# Patient Record
Sex: Female | Born: 1940 | Race: White | Hispanic: No | Smoking: Never smoker
Health system: Southern US, Community
[De-identification: ages and names within clinical notes are randomized; demographics above are authoritative.]

## PROBLEM LIST (undated history)

## (undated) DIAGNOSIS — Z1379 Encounter for other screening for genetic and chromosomal anomalies: Secondary | ICD-10-CM

## (undated) DIAGNOSIS — T8859XA Other complications of anesthesia, initial encounter: Secondary | ICD-10-CM

## (undated) DIAGNOSIS — K589 Irritable bowel syndrome without diarrhea: Secondary | ICD-10-CM

## (undated) DIAGNOSIS — H3530 Unspecified macular degeneration: Secondary | ICD-10-CM

## (undated) DIAGNOSIS — K5909 Other constipation: Secondary | ICD-10-CM

## (undated) DIAGNOSIS — Z780 Asymptomatic menopausal state: Secondary | ICD-10-CM

## (undated) DIAGNOSIS — R112 Nausea with vomiting, unspecified: Secondary | ICD-10-CM

## (undated) DIAGNOSIS — Z809 Family history of malignant neoplasm, unspecified: Secondary | ICD-10-CM

## (undated) DIAGNOSIS — K219 Gastro-esophageal reflux disease without esophagitis: Secondary | ICD-10-CM

## (undated) DIAGNOSIS — Z9889 Other specified postprocedural states: Secondary | ICD-10-CM

## (undated) DIAGNOSIS — M1990 Unspecified osteoarthritis, unspecified site: Secondary | ICD-10-CM

## (undated) DIAGNOSIS — C189 Malignant neoplasm of colon, unspecified: Secondary | ICD-10-CM

## (undated) DIAGNOSIS — C541 Malignant neoplasm of endometrium: Secondary | ICD-10-CM

## (undated) DIAGNOSIS — I10 Essential (primary) hypertension: Secondary | ICD-10-CM

## (undated) DIAGNOSIS — T4145XA Adverse effect of unspecified anesthetic, initial encounter: Secondary | ICD-10-CM

## (undated) HISTORY — PX: OTHER SURGICAL HISTORY: SHX169

## (undated) HISTORY — DX: Other constipation: K59.09

## (undated) HISTORY — DX: Asymptomatic menopausal state: Z78.0

## (undated) HISTORY — DX: Unspecified osteoarthritis, unspecified site: M19.90

## (undated) HISTORY — DX: Encounter for other screening for genetic and chromosomal anomalies: Z13.79

## (undated) HISTORY — DX: Family history of malignant neoplasm, unspecified: Z80.9

## (undated) HISTORY — PX: NASAL SINUS SURGERY: SHX719

## (undated) HISTORY — PX: VAGINAL HYSTERECTOMY: SUR661

## (undated) HISTORY — PX: FOOT SURGERY: SHX648

## (undated) HISTORY — DX: Essential (primary) hypertension: I10

## (undated) HISTORY — PX: BUNIONECTOMY: SHX129

## (undated) HISTORY — DX: Malignant neoplasm of endometrium: C54.1

## (undated) HISTORY — DX: Irritable bowel syndrome, unspecified: K58.9

## (undated) SURGERY — INSERTION, TUNNELED CENTRAL VENOUS DEVICE, WITH PORT
Anesthesia: IV Sedation (MBSC Only)

---

## 1969-11-14 HISTORY — PX: DILATION AND CURETTAGE OF UTERUS: SHX78

## 2005-03-24 ENCOUNTER — Ambulatory Visit: Payer: Self-pay | Admitting: Family Medicine

## 2006-09-18 ENCOUNTER — Ambulatory Visit: Payer: Self-pay | Admitting: Gastroenterology

## 2007-05-16 ENCOUNTER — Ambulatory Visit: Payer: Self-pay | Admitting: Family Medicine

## 2008-05-05 LAB — HM COLONOSCOPY: HM Colonoscopy: NORMAL

## 2008-07-09 ENCOUNTER — Ambulatory Visit: Payer: Self-pay | Admitting: Family Medicine

## 2009-04-22 ENCOUNTER — Ambulatory Visit: Payer: Self-pay | Admitting: Family Medicine

## 2009-07-13 ENCOUNTER — Ambulatory Visit: Payer: Self-pay | Admitting: Family Medicine

## 2010-04-19 LAB — HEMOGLOBIN A1C: Hgb A1c MFr Bld: 5.8 % (ref 4.0–6.0)

## 2010-04-19 LAB — BASIC METABOLIC PANEL WITH GFR
BUN: 22 mg/dL — AB (ref 4–21)
Creatinine: 0.9 mg/dL (ref 0.5–1.1)
Glucose: 97 mg/dL
Potassium: 4.9 mmol/L (ref 3.4–5.3)
Sodium: 138 mmol/L (ref 137–147)

## 2010-04-19 LAB — CBC AND DIFFERENTIAL: Neutrophils Absolute: 55 /uL

## 2010-04-19 LAB — LIPID PANEL: HDL: 65 mg/dL (ref 35–70)

## 2010-04-19 LAB — HEPATIC FUNCTION PANEL
ALT: 11 U/L (ref 7–35)
AST: 18 U/L (ref 13–35)

## 2010-07-14 ENCOUNTER — Ambulatory Visit: Payer: Self-pay | Admitting: Internal Medicine

## 2011-07-08 ENCOUNTER — Encounter: Payer: Self-pay | Admitting: Internal Medicine

## 2011-07-08 ENCOUNTER — Ambulatory Visit (INDEPENDENT_AMBULATORY_CARE_PROVIDER_SITE_OTHER): Payer: PRIVATE HEALTH INSURANCE | Admitting: Internal Medicine

## 2011-07-08 VITALS — BP 134/78 | HR 84 | Temp 98.2°F | Resp 16 | Ht 65.0 in | Wt 215.2 lb

## 2011-07-08 DIAGNOSIS — I10 Essential (primary) hypertension: Secondary | ICD-10-CM | POA: Insufficient documentation

## 2011-07-08 DIAGNOSIS — L259 Unspecified contact dermatitis, unspecified cause: Secondary | ICD-10-CM

## 2011-07-08 DIAGNOSIS — R251 Tremor, unspecified: Secondary | ICD-10-CM | POA: Insufficient documentation

## 2011-07-08 DIAGNOSIS — R259 Unspecified abnormal involuntary movements: Secondary | ICD-10-CM

## 2011-07-08 MED ORDER — TRIAMCINOLONE ACETONIDE 0.5 % EX CREA: TOPICAL_CREAM | Freq: Two times a day (BID) | CUTANEOUS | Status: DC

## 2011-07-08 MED ORDER — METOPROLOL SUCCINATE ER 25 MG PO TB24: 25.0000 mg | ORAL_TABLET | Freq: Every day | ORAL | Status: DC

## 2011-07-08 NOTE — Patient Instructions (Signed)
Poison Oak (Toxicodendron Dermatitis) Poison oak is an inflammation of the skin (contact dermatitis). It is caused by contact with the allergens on the leaves of the oak (toxicodendron) plants. Depending on your sensitivity, the rash may consist simply of redness and itching, or it may also progress to blisters which may break open (rupture). These must be well cared for to prevent secondary germ (bacterial) infection as these infections can lead to scarring. The eyes may also get puffy. The puffiness is worst in the morning and gets better as the day progresses. Healing is best accomplished by keeping any open areas dry, clean, covered with a bandage, and covered with an antibacterial ointment if needed. Without secondary infection, this dermatitis usually heals without scarring within 2 to 3 weeks without treatment. HOME CARE INSTRUCTIONS When you have been exposed to poison oak, it is very important to thoroughly wash with soap and water as soon as the exposure has been discovered. You have about one half hour to remove the plant resin before it will cause the rash. This cleaning will quickly destroy the oil or antigen on the skin (the antigen is what causes the rash). Wash aggressively under the fingernails as any plant resin still there will continue to spread the rash. Do not rub skin vigorously when washing affected area. Poison oak cannot spread if no oil from the plant remains on your body. Rash that has progressed to weeping sores (lesions) will not spread the rash unless you have not washed thoroughly. It is also important to clean any clothes you have been wearing as they may carry active allergens which will spread the rash, even several days later. Avoidance of the plant in the future is the best measure. Poison oak plants can be recognized by the number of leaves. Generally, poison oak has three leaves with flowering branches on a single stem. Diphenhydramine may be purchased over the counter  and used as needed for itching. Do not drive with this medication if it makes you drowsy. Ask your caregiver about medication for children. SEEK IMMEDIATE MEDICAL CARE IF:  Open areas of the rash develop.   You notice redness extending beyond the area of the rash.   There is a pus like discharge.   There is increased pain.   Other signs of infection develop (such as fever).  Document Released: 05/07/2003 Document Re-Released: 10/19/2009 [facility] Patient Information 2011 [place], [place].

## 2011-07-08 NOTE — Progress Notes (Signed)
Subjective:    Patient ID: [name], female    DOB: Mar 17, 1941, 70 y.o.   MRN: [id number]  HPI  [name] is a 70 year old female with a history of hypertension who presents for an acute visit complaining of a rash over both of her feet. She reports this rash has been present for approximately one week. It first developed after she was forced to walk through grass in her peer feet. She is concerned about exposure to poison oak. A friend recommended that she put nail polish remover on the lesions. She has been doing this without any improvement. She reports that the lesions are very itchy. She denies any systemic symptoms such as fever or chills.  [name] is also concerned about ongoing tremor in her right arm and hand. She notes that this tremor has been present for over a year and has gotten progressively worse. It occurs only with intention. It has limited her ability to write. She does note that her mother had a similar tremor. She has not had any evaluation for this. She denies any other focal neurological symptoms. She denies any weakness, other tremor, change in sensation, changing gait, or recent falls. She reports that lab work recently showed normal thyroid function.  Outpatient Encounter Prescriptions as of 07/08/2011  Medication Sig Dispense Refill  . benazepril (LOTENSIN) 40 MG tablet Take 20 mg by mouth daily.        . metoprolol succinate (TOPROL-XL) 25 MG 24 hr tablet Take 1 tablet (25 mg total) by mouth daily.  30 tablet  11  . triamcinolone (KENALOG) 0.5 % cream Apply topically 2 (two) times daily.  30 g  0     Review of Systems  Constitutional: Negative for fever, chills, appetite change, fatigue and unexpected weight change.  HENT: Negative for trouble swallowing.   Eyes: Negative for visual disturbance.  Respiratory: Negative for cough and shortness of breath.   Cardiovascular: Negative for chest pain.  Gastrointestinal: Negative for nausea, vomiting and diarrhea.    Genitourinary: Negative for dysuria and flank pain.  Musculoskeletal: Negative for myalgias, arthralgias and gait problem.  Skin: Positive for rash. Negative for color change.  Neurological: Positive for tremors. Negative for dizziness, weakness, numbness and headaches.  Hematological: Negative for adenopathy. Does not bruise/bleed easily.  Psychiatric/Behavioral: Negative for suicidal ideas, sleep disturbance and dysphoric mood. The patient is not nervous/anxious.     BP 134/78  Pulse 84  Temp(Src) 98.2 F (36.8 C) (Oral)  Resp 16  Ht 5\' 5"  (1.651 m)  Wt 215 lb 4 oz (97.637 kg)  BMI 35.82 kg/m2  SpO2 96%     Objective:   Physical Exam  Constitutional: She is oriented to person, place, and time. She appears well-developed and well-nourished. No distress.  HENT:  Head: Normocephalic and atraumatic.  Right Ear: External ear normal.  Left Ear: External ear normal.  Nose: Nose normal.  Mouth/Throat: Oropharynx is clear and moist. No oropharyngeal exudate.  Eyes: Conjunctivae and EOM are normal. Pupils are equal, round, and reactive to light. Right eye exhibits no discharge. Left eye exhibits no discharge. No scleral icterus.  Neck: Normal range of motion. Neck supple. No tracheal deviation present. No thyromegaly present.  Cardiovascular: Normal rate, regular rhythm, normal heart sounds and intact distal pulses.  Exam reveals no gallop and no friction rub.   No murmur heard. Pulmonary/Chest: Effort normal and breath sounds normal. No respiratory distress. She has no wheezes. She has no rales. She exhibits no  tenderness.  Musculoskeletal: Normal range of motion. She exhibits no edema and no tenderness.  Lymphadenopathy:    She has no cervical adenopathy.  Neurological: She is alert and oriented to person, place, and time. She has normal strength. She displays tremor. She displays normal reflexes. No cranial nerve deficit or sensory deficit. She exhibits normal muscle tone.  Coordination and gait normal.  Reflex Scores:      Patellar reflexes are 2+ on the right side and 2+ on the left side.      Tremor RUE with intention, cogwheel rigidity noted  Skin: Skin is warm and dry. Rash noted. She is not diaphoretic. No erythema. No pallor.          Blistering erythematous rash over both feet and ankles  Psychiatric: She has a normal mood and affect. Her behavior is normal. Judgment and thought content normal.         Assessment & Plan:  1. Contact dermatitis secondary to [facility] -  rash over the patient's bilateral feet consistent with contact dermatitis secondary to exposure to poison oak. Will treat with topical triamcinolone cream. We discussed the possibility of using oral prednisone, however patient would like to avoid exposure to oral steroids. She will call if symptoms do not improve. Should symptoms persist would favor using a course of oral steroids. She will also call should the erythema increases or should the lesions become increasingly painful, or she develop fever. She was given information today about the appearance of poison oak so that she can avoid exposure in the future.  2. Tremor - patient with a right upper tremor the intention tremor. Tremor is pill-rolling in nature. Patient notably also has cogwheel rigidity in her right upper extremity. Expressed my concern to her over possibility of Parkinson's disease. We discussed potential referral to neurology for additional valuation. She reports that her mother has a similar tremor which improved markedly with medication. We discussed a trial of a beta blocker, which would have the added benefit of helping to lower her blood pressure. She will return to clinic in one month to assess progress. At that time if there's no improvement in her tremor I would favor referral to neurology for additional valuation.  3. Hypertension - patient with hypertension currently taking Lotensin. As above we'll add metoprolol  today. With the goal of keeping blood pressure less than 120/80, an additional goal of improving her right upper extremity tremor. She will return to clinic in one month. We will request records from her recent renal function which was drawn with labs at her previous office.

## 2011-08-02 ENCOUNTER — Ambulatory Visit: Payer: Self-pay | Admitting: Specialist

## 2011-08-04 ENCOUNTER — Encounter: Payer: Self-pay | Admitting: Internal Medicine

## 2011-08-06 ENCOUNTER — Encounter: Payer: Self-pay | Admitting: Internal Medicine

## 2011-08-08 ENCOUNTER — Encounter: Payer: Self-pay | Admitting: Internal Medicine

## 2011-08-08 ENCOUNTER — Ambulatory Visit (INDEPENDENT_AMBULATORY_CARE_PROVIDER_SITE_OTHER): Payer: PRIVATE HEALTH INSURANCE | Admitting: Internal Medicine

## 2011-08-08 VITALS — BP 165/81 | HR 70 | Temp 98.0°F | Resp 16 | Ht 65.0 in | Wt 216.5 lb

## 2011-08-08 DIAGNOSIS — R251 Tremor, unspecified: Secondary | ICD-10-CM

## 2011-08-08 DIAGNOSIS — R259 Unspecified abnormal involuntary movements: Secondary | ICD-10-CM

## 2011-08-08 DIAGNOSIS — I10 Essential (primary) hypertension: Secondary | ICD-10-CM

## 2011-08-08 DIAGNOSIS — R140 Abdominal distension (gaseous): Secondary | ICD-10-CM

## 2011-08-08 DIAGNOSIS — R141 Gas pain: Secondary | ICD-10-CM

## 2011-08-08 MED ORDER — BENAZEPRIL HCL 40 MG PO TABS: 20.0000 mg | ORAL_TABLET | Freq: Every day | ORAL | Status: DC

## 2011-08-08 MED ORDER — PAROXETINE HCL 10 MG PO TABS: 10.0000 mg | ORAL_TABLET | Freq: Every day | ORAL | Status: DC

## 2011-08-08 NOTE — Progress Notes (Signed)
Subjective:    Patient ID: [name], female    DOB: 04-16-1941, 70 y.o.   MRN: [id number]  HPI Ms. Reaney is a 70 year old female with a history of hypertension and upper extremity tremor who presents for followup. At her last visit she was started on a beta blocker to see if there be any improvement in her tremor. She reports no improvement on her beta blocker. She completed a one-month course of this medication and stopped.  Her primary concern today is abdominal distention and bloating. She reports that for the last several weeks she has had intermittent watery diarrhea. The diarrhea has resolved but she now has abdominal distention and bloating. She denies any blood in her sore. She denies any change in appetite, weight loss. She denies any fever, chills. She reports the symptoms are consistent with her previous ear bowel syndrome. She does not want to pursue workup with imaging such as a CT scan or lab work were stool studies.  Outpatient Encounter Prescriptions as of 08/08/2011  Medication Sig Dispense Refill  . benazepril (LOTENSIN) 40 MG tablet Take 0.5 tablets (20 mg total) by mouth daily.  15 tablet  11    Review of Systems  Constitutional: Negative for fever, chills, appetite change, fatigue and unexpected weight change.  HENT: Negative for ear pain, congestion, sore throat, trouble swallowing, neck pain, voice change and sinus pressure.   Eyes: Negative for visual disturbance.  Respiratory: Negative for cough, shortness of breath, wheezing and stridor.   Cardiovascular: Negative for chest pain, palpitations and leg swelling.  Gastrointestinal: Positive for abdominal pain, diarrhea (improving) and abdominal distention. Negative for nausea, vomiting, constipation, blood in stool and anal bleeding.  Genitourinary: Negative for dysuria and flank pain.  Musculoskeletal: Negative for myalgias, arthralgias and gait problem.  Skin: Negative for color change and rash.  Neurological:  Positive for tremors. Negative for dizziness and headaches.  Hematological: Negative for adenopathy. Does not bruise/bleed easily.  Psychiatric/Behavioral: Negative for suicidal ideas, sleep disturbance and dysphoric mood. The patient is not nervous/anxious.    BP 165/81  Pulse 70  Temp(Src) 98 F (36.7 C) (Oral)  Resp 16  Ht 5\' 5"  (1.651 m)  Wt 216 lb 8 oz (98.204 kg)  BMI 36.03 kg/m2  SpO2 98%     Objective:   Physical Exam  Constitutional: She is oriented to person, place, and time. She appears well-developed and well-nourished. No distress.  HENT:  Head: Normocephalic and atraumatic.  Right Ear: External ear normal.  Left Ear: External ear normal.  Nose: Nose normal.  Mouth/Throat: Oropharynx is clear and moist. No oropharyngeal exudate.  Eyes: Conjunctivae are normal. Pupils are equal, round, and reactive to light. Right eye exhibits no discharge. Left eye exhibits no discharge. No scleral icterus.  Neck: Normal range of motion. Neck supple. No tracheal deviation present. No thyromegaly present.  Cardiovascular: Normal rate, regular rhythm, normal heart sounds and intact distal pulses.  Exam reveals no gallop and no friction rub.   No murmur heard. Pulmonary/Chest: Effort normal and breath sounds normal. No respiratory distress. She has no wheezes. She has no rales. She exhibits no tenderness.  Abdominal: Soft. She exhibits distension. She exhibits no mass. There is no tenderness. There is no rebound and no guarding.  Musculoskeletal: Normal range of motion. She exhibits no edema and no tenderness.  Lymphadenopathy:    She has no cervical adenopathy.  Neurological: She is alert and oriented to person, place, and time. She displays tremor. No cranial  nerve deficit. She exhibits normal muscle tone. Coordination normal.  Skin: Skin is warm and dry. No rash noted. She is not diaphoretic. No erythema. No pallor.  Psychiatric: She has a normal mood and affect. Her behavior is  normal. Judgment and thought content normal.          Assessment & Plan:  1. Tremor - patient with tremor which is concerning for Parkinson's. We briefly tried a beta blocker to see if any improvement, however she had no improvement with this intervention. We will set her up with neurology for additional evaluation.  2. Bloating - patient with bloating and abdominal distention after an episode of diarrhea which is consistent with her known irritable bowel syndrome. She does not want to pursue imaging were stool studies today. We will give a trial of Paxil to see if any improvement in her irritable bowel syndrome symptoms. She will return to clinic in one month. We discussed that if her symptoms do not improve she will likely need imaging and/or repeat colonoscopy.  3. Hypertension - patient with hypertension. Blood pressure is elevated today, but has historically been lower than this. We will plan to continue her Lotensin. She will monitor her blood pressure at home. If blood pressure is consistently greater than 140/90 she will call or return to clinic.

## 2011-08-11 ENCOUNTER — Telehealth: Payer: Self-pay | Admitting: Internal Medicine

## 2011-08-11 NOTE — Telephone Encounter (Signed)
Pt called she has question about her benazepril rx

## 2011-08-11 NOTE — Telephone Encounter (Signed)
Fine to do 90 day supply.

## 2011-08-11 NOTE — Telephone Encounter (Signed)
Can you check on this?

## 2011-08-12 NOTE — Telephone Encounter (Signed)
Pharmacist stated that she already picked up a 30day  Supply just next time we will do 90.

## 2011-08-25 ENCOUNTER — Encounter: Payer: Self-pay | Admitting: Internal Medicine

## 2011-09-07 ENCOUNTER — Ambulatory Visit: Payer: PRIVATE HEALTH INSURANCE | Admitting: Internal Medicine

## 2011-09-13 ENCOUNTER — Other Ambulatory Visit: Payer: Self-pay | Admitting: Internal Medicine

## 2011-09-13 DIAGNOSIS — I10 Essential (primary) hypertension: Secondary | ICD-10-CM

## 2011-09-15 ENCOUNTER — Telehealth: Payer: Self-pay | Admitting: Internal Medicine

## 2011-09-15 DIAGNOSIS — I10 Essential (primary) hypertension: Secondary | ICD-10-CM

## 2011-09-15 NOTE — Telephone Encounter (Signed)
[phone]  Pt called check other [facility] [address]  Does not have rx yet rx is benazepril for 90 days Pt is getting low on rx please call pt when rx is called in

## 2011-09-16 ENCOUNTER — Ambulatory Visit: Payer: PRIVATE HEALTH INSURANCE | Admitting: Internal Medicine

## 2011-09-16 MED ORDER — BENAZEPRIL HCL 40 MG PO TABS: 20.0000 mg | ORAL_TABLET | Freq: Every day | ORAL | Status: DC

## 2011-09-16 NOTE — Telephone Encounter (Signed)
Rx done-patient informed. 

## 2011-09-22 ENCOUNTER — Encounter: Payer: Self-pay | Admitting: Internal Medicine

## 2011-11-26 ENCOUNTER — Observation Stay: Payer: Self-pay | Admitting: Internal Medicine

## 2011-11-26 LAB — URINALYSIS, COMPLETE
Bilirubin,UR: NEGATIVE
Glucose,UR: NEGATIVE mg/dL (ref 0–75)
Ketone: NEGATIVE
Nitrite: NEGATIVE
Protein: NEGATIVE
Specific Gravity: 1.008 (ref 1.003–1.030)
Squamous Epithelial: 25
WBC UR: 32 /HPF (ref 0–5)

## 2011-11-26 LAB — CBC
MCHC: 34 g/dL (ref 32.0–36.0)
MCV: 87 fL (ref 80–100)
Platelet: 206 10*3/uL (ref 150–440)
RBC: 4.41 10*6/uL (ref 3.80–5.20)
RDW: 13.8 % (ref 11.5–14.5)
WBC: 6.2 10*3/uL (ref 3.6–11.0)

## 2011-11-26 LAB — COMPREHENSIVE METABOLIC PANEL
Albumin: 3.6 g/dL (ref 3.4–5.0)
Alkaline Phosphatase: 80 U/L (ref 50–136)
Anion Gap: 13 (ref 7–16)
BUN: 11 mg/dL (ref 7–18)
Chloride: 102 mmol/L (ref 98–107)
Creatinine: 1.14 mg/dL (ref 0.60–1.30)
EGFR (African American): >60
EGFR (Non-African Amer.): 50 — ABNORMAL LOW
Glucose: 165 mg/dL — ABNORMAL HIGH (ref 65–99)
Osmolality: 275 (ref 275–301)
Potassium: 4 mmol/L (ref 3.5–5.1)
SGOT(AST): 36 U/L (ref 15–37)
Total Protein: 7.5 g/dL (ref 6.4–8.2)

## 2011-11-26 LAB — TROPONIN I: Troponin-I: <0.02 ng/mL

## 2011-11-26 LAB — CK TOTAL AND CKMB (NOT AT ARMC): CK, Total: 166 U/L (ref 21–215)

## 2011-11-27 LAB — TROPONIN I: Troponin-I: <0.02 ng/mL

## 2011-11-28 LAB — CBC WITH DIFFERENTIAL/PLATELET
Basophil #: 0 10*3/uL (ref 0.0–0.1)
Basophil %: 0.5 %
Eosinophil #: 0.2 10*3/uL (ref 0.0–0.7)
Eosinophil %: 4.6 %
HCT: 35.9 % (ref 35.0–47.0)
HGB: 11.6 g/dL — ABNORMAL LOW (ref 12.0–16.0)
Lymphocyte #: 1.7 10*3/uL (ref 1.0–3.6)
MCH: 28.5 pg (ref 26.0–34.0)
MCHC: 32.3 g/dL (ref 32.0–36.0)
Monocyte #: 0.5 10*3/uL (ref 0.0–0.7)
Monocyte %: 11.6 %
RDW: 14 % (ref 11.5–14.5)

## 2011-11-28 LAB — BASIC METABOLIC PANEL
Calcium, Total: 8.8 mg/dL (ref 8.5–10.1)
Co2: 23 mmol/L (ref 21–32)
EGFR (Non-African Amer.): >60
Glucose: 89 mg/dL (ref 65–99)
Osmolality: 274 (ref 275–301)
Sodium: 138 mmol/L (ref 136–145)

## 2011-11-28 LAB — MAGNESIUM: Magnesium: 2.2 mg/dL

## 2011-12-01 ENCOUNTER — Encounter: Payer: Self-pay | Admitting: Internal Medicine

## 2011-12-01 ENCOUNTER — Ambulatory Visit (INDEPENDENT_AMBULATORY_CARE_PROVIDER_SITE_OTHER): Payer: PRIVATE HEALTH INSURANCE | Admitting: Internal Medicine

## 2011-12-01 VITALS — BP 120/68 | HR 84 | Temp 97.8°F

## 2011-12-01 DIAGNOSIS — R259 Unspecified abnormal involuntary movements: Secondary | ICD-10-CM

## 2011-12-01 DIAGNOSIS — R55 Syncope and collapse: Secondary | ICD-10-CM

## 2011-12-01 DIAGNOSIS — J40 Bronchitis, not specified as acute or chronic: Secondary | ICD-10-CM | POA: Insufficient documentation

## 2011-12-01 DIAGNOSIS — R251 Tremor, unspecified: Secondary | ICD-10-CM

## 2011-12-01 DIAGNOSIS — I951 Orthostatic hypotension: Secondary | ICD-10-CM

## 2011-12-01 MED ORDER — LEVOFLOXACIN 500 MG PO TABS: 500.0000 mg | ORAL_TABLET | Freq: Every day | ORAL | Status: DC

## 2011-12-01 NOTE — Assessment & Plan Note (Signed)
Occurred in the setting of bronchitis and sinusitis as well as urinary tract infection. Patient has not had any recurrent events after treatment with antibiotics. Encouraged her to continue to increase fluid intake. She will call if any recurrent symptoms. Otherwise, she will followup in 3 months.

## 2011-12-01 NOTE — Progress Notes (Signed)
Subjective:    Patient ID: [name], female    DOB: [birth year], 71 y.o.   MRN: [id number]  HPI  70YO female presents for hospital followup after recent hospitalization for syncopal episode. Episode occurred while pt waiting for medication in [facility]. BP 70/30 at time. Pt admitted, found to have sinusitis, bronchitis and UTI. Was treated with levaquin.  Reports symptoms of cough and congestion are improving, however still having productive cough with purulent sputum.  Denies fever or chills. No further syncope or lightheadedness.   Also notes she was recently seen by neurology for UE tremor. Started on Mysoline. Notes improvement in tremor. Reports that she has followup with neurology scheduled.  Outpatient Encounter Prescriptions as of 12/01/2011  Medication Sig Dispense Refill  . benazepril (LOTENSIN) 40 MG tablet Take 0.5 tablets (20 mg total) by mouth daily.  45 tablet  3  . primidone (MYSOLINE) 50 MG tablet Take 25 mg by mouth 2 (two) times daily.      Marland Kitchen levofloxacin (LEVAQUIN) 500 MG tablet Take 1 tablet (500 mg total) by mouth daily.  3 tablet  0  . PARoxetine (PAXIL) 10 MG tablet Take 1 tablet (10 mg total) by mouth daily.  30 tablet  2    Review of Systems  Constitutional: Negative for fever, chills, appetite change, fatigue and unexpected weight change.  HENT: Positive for congestion, rhinorrhea, postnasal drip and sinus pressure. Negative for ear pain, sore throat, trouble swallowing, neck pain and voice change.   Eyes: Negative for visual disturbance.  Respiratory: Positive for cough. Negative for shortness of breath, wheezing and stridor.   Cardiovascular: Negative for chest pain, palpitations and leg swelling.  Gastrointestinal: Negative for nausea, vomiting, abdominal pain, diarrhea, constipation, blood in stool, abdominal distention and anal bleeding.  Genitourinary: Negative for dysuria and flank pain.  Musculoskeletal: Negative for myalgias, arthralgias and gait problem.   Skin: Negative for color change and rash.  Neurological: Positive for syncope. Negative for dizziness and headaches.  Hematological: Negative for adenopathy. Does not bruise/bleed easily.  Psychiatric/Behavioral: Negative for suicidal ideas, sleep disturbance and dysphoric mood. The patient is not nervous/anxious.    BP 120/68  Pulse 84  Temp(Src) 97.8 F (36.6 C) (Oral)  SpO2 95%     Objective:   Physical Exam  Constitutional: She is oriented to person, place, and time. She appears well-developed and well-nourished. No distress.  HENT:  Head: Normocephalic and atraumatic.  Right Ear: External ear normal.  Left Ear: External ear normal.  Nose: Nose normal.  Mouth/Throat: Oropharynx is clear and moist. No oropharyngeal exudate.  Eyes: Conjunctivae are normal. Pupils are equal, round, and reactive to light. Right eye exhibits no discharge. Left eye exhibits no discharge. No scleral icterus.  Neck: Normal range of motion. Neck supple. No tracheal deviation present. No thyromegaly present.  Cardiovascular: Normal rate, regular rhythm, normal heart sounds and intact distal pulses.  Exam reveals no gallop and no friction rub.   No murmur heard. Pulmonary/Chest: Effort normal and breath sounds normal. No respiratory distress. She has no wheezes. She has no rales. She exhibits no tenderness.  Musculoskeletal: Normal range of motion. She exhibits no edema and no tenderness.  Lymphadenopathy:    She has no cervical adenopathy.  Neurological: She is alert and oriented to person, place, and time. No cranial nerve deficit. She exhibits normal muscle tone. Coordination normal.  Skin: Skin is warm and dry. No rash noted. She is not diaphoretic. No erythema. No pallor.  Psychiatric: She has  a normal mood and affect. Her behavior is normal. Judgment and thought content normal.          Assessment & Plan:

## 2011-12-01 NOTE — Assessment & Plan Note (Signed)
Patient was recently admitted to the hospital with an episode of bronchitis that led to orthostatic hypotension and syncope. Her symptoms are improving, however she continues to have productive cough. We will extend her course of Levaquin by 3 days. Offered to cough suppressant medications, however pt declined. She will call or e-mail if symptoms are not improving or if they're worsening.

## 2011-12-01 NOTE — Assessment & Plan Note (Addendum)
Recently seen by neurology. Started on Mysoline to help with tremor, with improvement. Will request records on her evaluation and treatment.

## 2011-12-06 ENCOUNTER — Ambulatory Visit: Payer: Self-pay | Admitting: Neurology

## 2011-12-12 ENCOUNTER — Telehealth: Payer: Self-pay | Admitting: Internal Medicine

## 2011-12-12 NOTE — Telephone Encounter (Signed)
[phone] Pt called to get appointment i gave her 12/14/11  She wanted to know if she could be seen earlier Ear congestion left   No pain no fever  Has been going on several days.  Pt stated she just got over sinus infection and broncitias.

## 2011-12-12 NOTE — Telephone Encounter (Signed)
Spoke with patient. We have no openings before the 30th. Patient is fine with waiting until then to be seen.

## 2011-12-14 ENCOUNTER — Ambulatory Visit (INDEPENDENT_AMBULATORY_CARE_PROVIDER_SITE_OTHER): Payer: Medicare Other | Admitting: Internal Medicine

## 2011-12-14 ENCOUNTER — Encounter: Payer: Self-pay | Admitting: Internal Medicine

## 2011-12-14 DIAGNOSIS — J40 Bronchitis, not specified as acute or chronic: Secondary | ICD-10-CM

## 2011-12-14 DIAGNOSIS — H6690 Otitis media, unspecified, unspecified ear: Secondary | ICD-10-CM

## 2011-12-14 DIAGNOSIS — H6692 Otitis media, unspecified, left ear: Secondary | ICD-10-CM

## 2011-12-14 MED ORDER — DOXYCYCLINE HYCLATE 100 MG PO TABS: 100.0000 mg | ORAL_TABLET | Freq: Two times a day (BID) | ORAL | Status: AC

## 2011-12-14 NOTE — Assessment & Plan Note (Signed)
Pt with left otitis media, treated previously with Levaquin x10 days. She continues to have left ear pain. Visualization of her TM is difficult today because of obstructing wax, however there appears to be a small perforation anteriorly. Will set her up with ENT for further evaluation. We'll treat empirically with doxycycline in hopes to improve with strep coverage. Follow up 1 month.

## 2011-12-14 NOTE — Assessment & Plan Note (Signed)
Symptoms improving, however patient continues to have productive cough and left sided rhonchi noted on exam. Will start a ten-day course of doxycycline in hopes of improving coverage for strep pneumococcus. Followup in one month or sooner if symptoms are not improving. If symptoms not improving over next 72 hours, would favor getting repeat chest x-ray.

## 2011-12-14 NOTE — Progress Notes (Signed)
Subjective:    Patient ID: [name], female    DOB: 1941-07-29, 71 y.o.   MRN: [id number]  HPI 71 year old female with a recent history of bronchitis presents for acute visit complaining of persistent cough productive of purulent sputum and severe left ear pain. She was treated recently with ten-day course of Levaquin. She reports some improvement in her cough, however continues to have mild cough productive of purulent sputum. She has not had any further fever or chills. She reports severe left-sided ear pain. She has not had any drainage from her ear. The ear pain radiates down her left neck. She has not noticed a change in her hearing. She continues to use cough suppressants at night with some improvement in her symptoms. She has not taking any ibuprofen or Tylenol this time. She denies shortness of breath. She denies chest pain.  Outpatient Encounter Prescriptions as of 12/14/2011  Medication Sig Dispense Refill  . benazepril (LOTENSIN) 40 MG tablet Take 0.5 tablets (20 mg total) by mouth daily.  45 tablet  3  . Multiple Vitamins-Minerals (MULTIVITAMIN WITH MINERALS) tablet Take 1 tablet by mouth daily.      Marland Kitchen PARoxetine (PAXIL) 10 MG tablet Take 1 tablet (10 mg total) by mouth daily.  30 tablet  2  . primidone (MYSOLINE) 50 MG tablet Take 25 mg by mouth 2 (two) times daily.      Marland Kitchen doxycycline (VIBRA-TABS) 100 MG tablet Take 1 tablet (100 mg total) by mouth 2 (two) times daily.  20 tablet  0  . DISCONTD: levofloxacin (LEVAQUIN) 500 MG tablet Take 1 tablet (500 mg total) by mouth daily.  3 tablet  0    Review of Systems  Constitutional: Negative for fever, chills and unexpected weight change.  HENT: Positive for ear pain. Negative for hearing loss, nosebleeds, congestion, sore throat, facial swelling, rhinorrhea, sneezing, mouth sores, trouble swallowing, neck pain, neck stiffness, voice change, postnasal drip, sinus pressure, tinnitus and ear discharge.   Eyes: Negative for pain,  discharge, redness and visual disturbance.  Respiratory: Positive for cough. Negative for chest tightness, shortness of breath, wheezing and stridor.   Cardiovascular: Negative for chest pain, palpitations and leg swelling.  Musculoskeletal: Negative for myalgias and arthralgias.  Skin: Negative for color change and rash.  Neurological: Negative for dizziness, weakness, light-headedness and headaches.  Hematological: Negative for adenopathy.   BP 128/72  Pulse 102  Temp(Src) 98.2 F (36.8 C) (Oral)  Ht 5\' 5"  (1.651 m)  SpO2 96%     Objective:   Physical Exam  Constitutional: She is oriented to person, place, and time. She appears well-developed and well-nourished. No distress.  HENT:  Head: Normocephalic and atraumatic.  Right Ear: External ear normal. A middle ear effusion is present.  Left Ear: External ear normal. Tympanic membrane is perforated. A middle ear effusion is present.  Ears:  Nose: Nose normal.  Mouth/Throat: Oropharynx is clear and moist. No oropharyngeal exudate.  Eyes: Conjunctivae are normal. Pupils are equal, round, and reactive to light. Right eye exhibits no discharge. Left eye exhibits no discharge. No scleral icterus.  Neck: Normal range of motion. Neck supple. No tracheal deviation present. No thyromegaly present.  Cardiovascular: Normal rate, regular rhythm, normal heart sounds and intact distal pulses.  Exam reveals no gallop and no friction rub.   No murmur heard. Pulmonary/Chest: Effort normal. No accessory muscle usage. Not tachypneic. No respiratory distress. She has no decreased breath sounds. She has no wheezes. She has rhonchi in the  left middle field and the left lower field. She has no rales. She exhibits no tenderness.  Musculoskeletal: Normal range of motion. She exhibits no edema and no tenderness.  Lymphadenopathy:    She has no cervical adenopathy.  Neurological: She is alert and oriented to person, place, and time. No cranial nerve deficit.  She exhibits normal muscle tone. Coordination normal.  Skin: Skin is warm and dry. No rash noted. She is not diaphoretic. No erythema. No pallor.  Psychiatric: She has a normal mood and affect. Her behavior is normal. Judgment and thought content normal.          Assessment & Plan:

## 2011-12-26 ENCOUNTER — Telehealth: Payer: Self-pay | Admitting: Internal Medicine

## 2011-12-26 NOTE — Telephone Encounter (Signed)
Pt was seen and treated with Doxycycline for bronchitis at last visit. If new or worsening symptoms, needs to be seen.

## 2011-12-26 NOTE — Telephone Encounter (Signed)
Spoke w/pt - she is c/o continued sinus congestion and feels that she is "not over" her sinus infection. Scheduled pt for OV Wed

## 2011-12-26 NOTE — Telephone Encounter (Signed)
Call a Nurse Office Message [address] [place], [place] [address] p. [phone] f. [phone] To: [facility] (Daytime Triage) Fax: [phone] From: [facility] Date/ Time: 12/26/2011 9:59 AM Taken By: Annalee Genta, CSR Caller: Bonita Quin Facility: not collected Patient: [name], [name] DOB: 1941-02-12 Phone: [phone] Reason for Call: Please call pt to advise if Levoquin can be refilled for continuing sinus infection symptoms. Regarding Appointment: Appt Date: Appt Time: Unknown Provider: Reason: Details: Outcome. Confidential

## 2011-12-28 ENCOUNTER — Ambulatory Visit: Payer: Medicare Other | Admitting: Internal Medicine

## 2012-01-12 ENCOUNTER — Ambulatory Visit: Payer: Medicare Other | Admitting: Internal Medicine

## 2012-02-10 ENCOUNTER — Emergency Department: Payer: Self-pay | Admitting: Emergency Medicine

## 2012-07-12 ENCOUNTER — Telehealth: Payer: Self-pay | Admitting: *Deleted

## 2012-07-12 DIAGNOSIS — Z1239 Encounter for other screening for malignant neoplasm of breast: Secondary | ICD-10-CM

## 2012-07-12 DIAGNOSIS — I10 Essential (primary) hypertension: Secondary | ICD-10-CM

## 2012-07-12 MED ORDER — BENAZEPRIL HCL 40 MG PO TABS: 20.0000 mg | ORAL_TABLET | Freq: Every day | ORAL | Status: DC

## 2012-07-12 NOTE — Telephone Encounter (Signed)
Patient requested order for mammogram and a Rx refill on Benazepril.  Rx sent to [facility]  Advised patient that [facility] Noe will be calling her to schedule mammogram.

## 2012-08-02 ENCOUNTER — Ambulatory Visit: Payer: Self-pay | Admitting: Internal Medicine

## 2012-11-14 DIAGNOSIS — C541 Malignant neoplasm of endometrium: Secondary | ICD-10-CM

## 2012-11-14 HISTORY — DX: Malignant neoplasm of endometrium: C54.1

## 2012-12-29 ENCOUNTER — Other Ambulatory Visit: Payer: Self-pay

## 2013-01-19 ENCOUNTER — Emergency Department: Payer: Self-pay | Admitting: Emergency Medicine

## 2013-01-19 LAB — COMPREHENSIVE METABOLIC PANEL
Albumin: 3.5 g/dL (ref 3.4–5.0)
Anion Gap: 9 (ref 7–16)
Calcium, Total: 9.3 mg/dL (ref 8.5–10.1)
Co2: 24 mmol/L (ref 21–32)
EGFR (African American): 55 — ABNORMAL LOW
Glucose: 162 mg/dL — ABNORMAL HIGH (ref 65–99)
Osmolality: 281 (ref 275–301)
Potassium: 3.7 mmol/L (ref 3.5–5.1)
Sodium: 139 mmol/L (ref 136–145)
Total Protein: 7.7 g/dL (ref 6.4–8.2)

## 2013-01-19 LAB — URINALYSIS, COMPLETE
Nitrite: NEGATIVE
Ph: 6 (ref 4.5–8.0)
Protein: 30
RBC,UR: 2 /HPF (ref 0–5)
Specific Gravity: 1.018 (ref 1.003–1.030)
Squamous Epithelial: 4
WBC UR: 9 /HPF (ref 0–5)

## 2013-01-19 LAB — TROPONIN I: Troponin-I: <0.02 ng/mL

## 2013-01-19 LAB — CBC
HGB: 13.7 g/dL (ref 12.0–16.0)
MCHC: 32.9 g/dL (ref 32.0–36.0)
MCV: 87 fL (ref 80–100)
RBC: 4.77 10*6/uL (ref 3.80–5.20)
RDW: 14.8 % — ABNORMAL HIGH (ref 11.5–14.5)
WBC: 7.5 10*3/uL (ref 3.6–11.0)

## 2013-01-25 ENCOUNTER — Emergency Department: Payer: Self-pay | Admitting: Emergency Medicine

## 2013-04-11 ENCOUNTER — Telehealth: Payer: Self-pay | Admitting: *Deleted

## 2013-04-11 NOTE — Telephone Encounter (Signed)
She is having a lot of nasal mucous and drainage feeling like she has something to her throat.Dr. Andee Poles is thinking she maybe having an intolerance or something since she has been on it for so long. He has tried allergy medicine and Nexium with no relief. But he would like for you to make that change since he does not prescribe that kind of medication.

## 2013-04-11 NOTE — Telephone Encounter (Signed)
[name] from [facility] called and would like to know if it would be ok if the doctor took her off the Benazepril. He would like to change her to something else similar because she keeps coming back with the same symptoms.

## 2013-04-11 NOTE — Telephone Encounter (Signed)
Can we bring her in for a visit?

## 2013-04-11 NOTE — Telephone Encounter (Signed)
That is fine 

## 2013-04-11 NOTE — Telephone Encounter (Signed)
Left message to call back  

## 2013-04-12 NOTE — Telephone Encounter (Signed)
Appointment has been scheduled.

## 2013-04-17 ENCOUNTER — Ambulatory Visit (INDEPENDENT_AMBULATORY_CARE_PROVIDER_SITE_OTHER): Payer: Medicare Other | Admitting: Internal Medicine

## 2013-04-17 ENCOUNTER — Encounter: Payer: Self-pay | Admitting: Internal Medicine

## 2013-04-17 VITALS — BP 117/69 | HR 60 | Temp 98.2°F

## 2013-04-17 DIAGNOSIS — I10 Essential (primary) hypertension: Secondary | ICD-10-CM

## 2013-04-17 MED ORDER — LOSARTAN POTASSIUM 50 MG PO TABS: 50.0000 mg | ORAL_TABLET | Freq: Every day | ORAL | Status: DC

## 2013-04-17 NOTE — Assessment & Plan Note (Signed)
Will change Benazapril to Losartan. Will repeat BMP to check Cr and K in 1 week. Follow up BP check in 1 month. Pt will call if any problems with this medication. She will monitor BP at home and call if persistently >140/90.

## 2013-04-17 NOTE — Progress Notes (Signed)
Subjective:    Patient ID: [name], female    DOB: 09-06-1941, 72 y.o.   MRN: [id number]  HPI 72 year old female presents to discuss blood pressure medication. She reports that her ENT physician was concerned that benazepril may be leading to symptoms of chronic cough and increased mucus production. She would like to change this medication. She reports blood pressure however has been well-controlled. She denies any headache, chest pain, palpitations. She is otherwise feeling well.  Outpatient Encounter Prescriptions as of 04/17/2013  Medication Sig Dispense Refill  . metoprolol succinate (TOPROL-XL) 100 MG 24 hr tablet Take 100 mg by mouth daily.       . Multiple Vitamins-Minerals (MULTIVITAMIN WITH MINERALS) tablet Take 1 tablet by mouth daily.      Marland Kitchen omeprazole (PRILOSEC) 40 MG capsule Take 40 mg by mouth daily.       Marland Kitchen topiramate (TOPAMAX) 50 MG tablet Take 50 mg by mouth daily.       . [DISCONTINUED] benazepril (LOTENSIN) 40 MG tablet Take 0.5 tablets (20 mg total) by mouth daily.  45 tablet  1  . losartan (COZAAR) 50 MG tablet Take 1 tablet (50 mg total) by mouth daily.  30 tablet  6  . [DISCONTINUED] PARoxetine (PAXIL) 10 MG tablet Take 1 tablet (10 mg total) by mouth daily.  30 tablet  2  . [DISCONTINUED] primidone (MYSOLINE) 50 MG tablet Take 25 mg by mouth 2 (two) times daily.       No facility-administered encounter medications on file as of 04/17/2013.   BP 117/69  Pulse 60  Temp(Src) 98.2 F (36.8 C) (Oral)  SpO2 98%  Review of Systems  Constitutional: Negative for fever, chills, appetite change, fatigue and unexpected weight change.  HENT: Negative for ear pain, congestion, sore throat, trouble swallowing, neck pain, voice change and sinus pressure.   Eyes: Negative for visual disturbance.  Respiratory: Positive for cough. Negative for shortness of breath, wheezing and stridor.   Cardiovascular: Negative for chest pain, palpitations and leg swelling.  Gastrointestinal:  Negative for nausea, vomiting, abdominal pain, diarrhea, constipation, blood in stool, abdominal distention and anal bleeding.  Genitourinary: Negative for dysuria and flank pain.  Musculoskeletal: Negative for myalgias, arthralgias and gait problem.  Skin: Negative for color change and rash.  Neurological: Negative for dizziness and headaches.  Hematological: Negative for adenopathy. Does not bruise/bleed easily.  Psychiatric/Behavioral: Negative for suicidal ideas, sleep disturbance and dysphoric mood. The patient is not nervous/anxious.        Objective:   Physical Exam  Constitutional: She is oriented to person, place, and time. She appears well-developed and well-nourished. No distress.  HENT:  Head: Normocephalic and atraumatic.  Right Ear: External ear normal.  Left Ear: External ear normal.  Nose: Nose normal.  Mouth/Throat: Oropharynx is clear and moist. No oropharyngeal exudate.  Eyes: Conjunctivae are normal. Pupils are equal, round, and reactive to light. Right eye exhibits no discharge. Left eye exhibits no discharge. No scleral icterus.  Neck: Normal range of motion. Neck supple. No tracheal deviation present. No thyromegaly present.  Cardiovascular: Normal rate, regular rhythm, normal heart sounds and intact distal pulses.  Exam reveals no gallop and no friction rub.   No murmur heard. Pulmonary/Chest: Effort normal and breath sounds normal. No accessory muscle usage. Not tachypneic. No respiratory distress. She has no decreased breath sounds. She has no wheezes. She has no rhonchi. She has no rales. She exhibits no tenderness.  Musculoskeletal: Normal range of motion. She exhibits no  edema and no tenderness.  Lymphadenopathy:    She has no cervical adenopathy.  Neurological: She is alert and oriented to person, place, and time. No cranial nerve deficit. She exhibits normal muscle tone. Coordination normal.  Skin: Skin is warm and dry. No rash noted. She is not diaphoretic.  No erythema. No pallor.  Psychiatric: She has a normal mood and affect. Her behavior is normal. Judgment and thought content normal.          Assessment & Plan:

## 2013-04-24 ENCOUNTER — Encounter: Payer: Self-pay | Admitting: Internal Medicine

## 2013-04-24 ENCOUNTER — Telehealth: Payer: Self-pay | Admitting: *Deleted

## 2013-04-24 ENCOUNTER — Other Ambulatory Visit (INDEPENDENT_AMBULATORY_CARE_PROVIDER_SITE_OTHER): Payer: Medicare Other

## 2013-04-24 DIAGNOSIS — I10 Essential (primary) hypertension: Secondary | ICD-10-CM

## 2013-04-24 LAB — BASIC METABOLIC PANEL
BUN: 13 mg/dL (ref 6–23)
Calcium: 9.4 mg/dL (ref 8.4–10.5)
Creatinine, Ser: 0.9 mg/dL (ref 0.4–1.2)
GFR: 63.77 mL/min (ref >60.00)

## 2013-04-24 NOTE — Telephone Encounter (Signed)
Pt would like today labs sent to her ENT doctor, Dr. Andee Poles ; Fax Number: [phone]

## 2013-04-30 NOTE — Telephone Encounter (Signed)
Faxed via Epic

## 2013-05-15 ENCOUNTER — Encounter: Payer: Self-pay | Admitting: Internal Medicine

## 2013-05-15 ENCOUNTER — Ambulatory Visit: Payer: Medicare Other

## 2013-05-30 ENCOUNTER — Encounter: Payer: Self-pay | Admitting: Internal Medicine

## 2013-06-19 ENCOUNTER — Other Ambulatory Visit: Payer: Self-pay

## 2013-06-20 ENCOUNTER — Telehealth: Payer: Self-pay | Admitting: Internal Medicine

## 2013-06-20 NOTE — Telephone Encounter (Signed)
I would not recommend getting pneumonia and shingles vaccine at the same time. I would recommend getting pneumonia and Tdap, then a few weeks later get the shingles vaccine. She should get the shingles vaccine at a local pharmacy, as it would likely be less expensive for her.

## 2013-06-20 NOTE — Telephone Encounter (Signed)
Fwd to Dr. Walker 

## 2013-06-20 NOTE — Telephone Encounter (Signed)
Pt called she is coming in on 8/21 for pneumonia shot and her my chart stated she needed tentus and shingles  Pt wanted to know if she could get all 3 at same time Pt stated you could sent her a my chart message letting her know

## 2013-06-21 NOTE — Telephone Encounter (Signed)
Left message to call back  

## 2013-06-24 NOTE — Telephone Encounter (Signed)
Left detailed information on patient voicemail.

## 2013-07-04 ENCOUNTER — Ambulatory Visit: Payer: Medicare Other

## 2013-07-09 ENCOUNTER — Ambulatory Visit: Payer: Medicare Other

## 2013-07-16 ENCOUNTER — Telehealth: Payer: Self-pay | Admitting: Internal Medicine

## 2013-07-16 ENCOUNTER — Encounter: Payer: Self-pay | Admitting: Internal Medicine

## 2013-07-16 NOTE — Telephone Encounter (Signed)
See below my chart message is it ok to schedule both shots at same time  Reason: To address the following health maintenance concerns . Tetanus/Tdap Pneumococcal Polysaccharide Vaccine Age 72  And Over Comments: I would like to schedule an appointment for both shots at the same time - about 10:00 or later on Weds., Thurs., or Fri. of this week. Thank you!!

## 2013-07-17 NOTE — Telephone Encounter (Signed)
Fine to give both shots, however she should check to be sure that insurance covers Tdap.

## 2013-07-17 NOTE — Telephone Encounter (Signed)
Sent patient a Mychart message.

## 2013-07-17 NOTE — Telephone Encounter (Signed)
Ok to give her those shots?

## 2013-07-25 ENCOUNTER — Ambulatory Visit (INDEPENDENT_AMBULATORY_CARE_PROVIDER_SITE_OTHER): Payer: Medicare Other | Admitting: *Deleted

## 2013-07-25 DIAGNOSIS — Z23 Encounter for immunization: Secondary | ICD-10-CM

## 2013-07-26 ENCOUNTER — Encounter: Payer: Self-pay | Admitting: Internal Medicine

## 2013-08-15 ENCOUNTER — Ambulatory Visit: Payer: Self-pay | Admitting: Internal Medicine

## 2013-08-23 ENCOUNTER — Encounter: Payer: Self-pay | Admitting: Internal Medicine

## 2013-09-20 ENCOUNTER — Ambulatory Visit (INDEPENDENT_AMBULATORY_CARE_PROVIDER_SITE_OTHER): Payer: Medicare Other | Admitting: Internal Medicine

## 2013-09-20 ENCOUNTER — Encounter: Payer: Self-pay | Admitting: Internal Medicine

## 2013-09-20 VITALS — BP 154/84 | HR 65 | Temp 97.8°F

## 2013-09-20 DIAGNOSIS — R8290 Unspecified abnormal findings in urine: Secondary | ICD-10-CM | POA: Insufficient documentation

## 2013-09-20 DIAGNOSIS — R413 Other amnesia: Secondary | ICD-10-CM | POA: Insufficient documentation

## 2013-09-20 DIAGNOSIS — R82998 Other abnormal findings in urine: Secondary | ICD-10-CM

## 2013-09-20 LAB — CBC WITH DIFFERENTIAL/PLATELET
Eosinophils Relative: 2 % (ref 0–5)
HCT: 41.2 % (ref 36.0–46.0)
Hemoglobin: 14.2 g/dL (ref 12.0–15.0)
Lymphocytes Relative: 32 % (ref 12–46)
Lymphs Abs: 2.7 10*3/uL (ref 0.7–4.0)
MCH: 29.7 pg (ref 26.0–34.0)
MCHC: 34.5 g/dL (ref 30.0–36.0)
MCV: 86.2 fL (ref 78.0–100.0)
Monocytes Absolute: 0.5 10*3/uL (ref 0.1–1.0)
Monocytes Relative: 6 % (ref 3–12)
RBC: 4.78 MIL/uL (ref 3.87–5.11)
RDW: 15.1 % (ref 11.5–15.5)
WBC: 8.5 10*3/uL (ref 4.0–10.5)

## 2013-09-20 LAB — COMPREHENSIVE METABOLIC PANEL
ALT: 13 U/L (ref 0–35)
Alkaline Phosphatase: 102 U/L (ref 39–117)
CO2: 25 mEq/L (ref 19–32)
Calcium: 9.7 mg/dL (ref 8.4–10.5)
Chloride: 104 mEq/L (ref 96–112)
Creat: 0.85 mg/dL (ref 0.50–1.10)
Glucose, Bld: 89 mg/dL (ref 70–99)
Total Bilirubin: 0.7 mg/dL (ref 0.3–1.2)

## 2013-09-20 LAB — TSH: TSH: 1.529 u[IU]/mL (ref 0.350–4.500)

## 2013-09-20 LAB — POCT URINALYSIS DIPSTICK
Ketones, UA: NEGATIVE
Protein, UA: NEGATIVE
Spec Grav, UA: 1.015
Urobilinogen, UA: 1
pH, UA: 7

## 2013-09-20 MED ORDER — CIPROFLOXACIN HCL 500 MG PO TABS: 500.0000 mg | ORAL_TABLET | Freq: Two times a day (BID) | ORAL | Status: DC

## 2013-09-20 NOTE — Assessment & Plan Note (Signed)
Pt reports abnormal urine odor for several months. No other symptoms. Urinalysis pos for blood and leukocytes. Will send urine for culture. Will treat empirically for UTI with cipro. Follow up prn.

## 2013-09-20 NOTE — Assessment & Plan Note (Addendum)
Recent symptoms of short term memory loss. Exam normal today. Will check labs including CBC, CMP, B12 and TSH. Discussed referral for cognitive testing, however pt would like to hold off for now.

## 2013-09-20 NOTE — Progress Notes (Signed)
Subjective:    Patient ID: [name], female    DOB: 1941/03/28, 72 y.o.   MRN: [id number]  HPI 72YO female presents for acute visit.  1. Abnormal odor to urine and intermittent "pink tinge" to urine for several months. No fever, chills, flank pain, dysuria, no urgency or frequency. No vaginal discharge.  2. Memory issues - More forgetful recently. Eg forgot mammogram appointment x2 despite making notes. Forgot Wednesday night Bible study. No trouble with directions, managing finances. No family h/o dementia. No fatigue, change in appetite or other symptoms.   Outpatient Encounter Prescriptions as of 09/20/2013  Medication Sig  . losartan (COZAAR) 50 MG tablet Take 1 tablet (50 mg total) by mouth daily.  . metoprolol succinate (TOPROL-XL) 100 MG 24 hr tablet Take 100 mg by mouth daily.   . Multiple Vitamins-Minerals (MULTIVITAMIN WITH MINERALS) tablet Take 1 tablet by mouth daily.  Marland Kitchen topiramate (TOPAMAX) 50 MG tablet Take 50 mg by mouth daily.   Marland Kitchen omeprazole (PRILOSEC) 40 MG capsule Take 40 mg by mouth daily.    BP 154/84  Pulse 65  Temp(Src) 97.8 F (36.6 C) (Oral)  SpO2 96%  Review of Systems  Constitutional: Negative for fever, chills, appetite change, fatigue and unexpected weight change.  HENT: Negative for congestion, ear pain, sinus pressure, sore throat, trouble swallowing and voice change.   Eyes: Negative for visual disturbance.  Respiratory: Negative for cough, shortness of breath, wheezing and stridor.   Cardiovascular: Negative for chest pain, palpitations and leg swelling.  Gastrointestinal: Negative for nausea, vomiting, abdominal pain, diarrhea, constipation, blood in stool, abdominal distention and anal bleeding.  Genitourinary: Negative for dysuria, urgency, frequency, flank pain and decreased urine volume.  Musculoskeletal: Negative for arthralgias, gait problem, myalgias and neck pain.  Skin: Negative for color change and rash.  Neurological: Negative for  dizziness and headaches.  Hematological: Negative for adenopathy. Does not bruise/bleed easily.  Psychiatric/Behavioral: Positive for decreased concentration. Negative for suicidal ideas, sleep disturbance and dysphoric mood. The patient is not nervous/anxious.        Objective:   Physical Exam  Constitutional: She is oriented to person, place, and time. She appears well-developed and well-nourished. No distress.  HENT:  Head: Normocephalic and atraumatic.  Right Ear: External ear normal.  Left Ear: External ear normal.  Nose: Nose normal.  Mouth/Throat: Oropharynx is clear and moist. No oropharyngeal exudate.  Eyes: Conjunctivae are normal. Pupils are equal, round, and reactive to light. Right eye exhibits no discharge. Left eye exhibits no discharge. No scleral icterus.  Neck: Normal range of motion. Neck supple. No tracheal deviation present. No thyromegaly present.  Cardiovascular: Normal rate, regular rhythm, normal heart sounds and intact distal pulses.  Exam reveals no gallop and no friction rub.   No murmur heard. Pulmonary/Chest: Effort normal and breath sounds normal. No accessory muscle usage. Not tachypneic. No respiratory distress. She has no decreased breath sounds. She has no wheezes. She has no rhonchi. She has no rales. She exhibits no tenderness.  Abdominal: There is no tenderness (no CVA tenderness).  Musculoskeletal: Normal range of motion. She exhibits no edema and no tenderness.  Lymphadenopathy:    She has no cervical adenopathy.  Neurological: She is alert and oriented to person, place, and time. No cranial nerve deficit. She exhibits normal muscle tone. Coordination normal.  Skin: Skin is warm and dry. No rash noted. She is not diaphoretic. No erythema. No pallor.  Psychiatric: She has a normal mood and affect. Her behavior  is normal. Judgment and thought content normal. Cognition and memory are normal.          Assessment & Plan:

## 2013-09-20 NOTE — Progress Notes (Signed)
Pre-visit discussion using our clinic review tool. No additional management support is needed unless otherwise documented below in the visit note.  

## 2013-09-21 LAB — URINE CULTURE: Colony Count: >=100000

## 2013-09-23 ENCOUNTER — Encounter: Payer: Self-pay | Admitting: Internal Medicine

## 2013-09-24 ENCOUNTER — Other Ambulatory Visit (INDEPENDENT_AMBULATORY_CARE_PROVIDER_SITE_OTHER): Payer: Medicare Other

## 2013-09-24 ENCOUNTER — Encounter: Payer: Self-pay | Admitting: Internal Medicine

## 2013-09-24 DIAGNOSIS — N390 Urinary tract infection, site not specified: Secondary | ICD-10-CM

## 2013-09-24 LAB — POCT URINALYSIS DIPSTICK
Glucose, UA: NEGATIVE
Ketones, UA: NEGATIVE
Nitrite, UA: NEGATIVE
Protein, UA: NEGATIVE
Urobilinogen, UA: 0.2
pH, UA: 5

## 2013-09-25 LAB — URINE CULTURE: Organism ID, Bacteria: NO GROWTH

## 2013-09-26 ENCOUNTER — Encounter: Payer: Self-pay | Admitting: Internal Medicine

## 2013-11-22 ENCOUNTER — Other Ambulatory Visit: Payer: Self-pay | Admitting: *Deleted

## 2013-11-22 DIAGNOSIS — I10 Essential (primary) hypertension: Secondary | ICD-10-CM

## 2013-11-22 MED ORDER — LOSARTAN POTASSIUM 50 MG PO TABS: 50.0000 mg | ORAL_TABLET | Freq: Every day | ORAL | Status: DC

## 2013-11-24 ENCOUNTER — Encounter: Payer: Self-pay | Admitting: Internal Medicine

## 2013-11-25 ENCOUNTER — Encounter: Payer: Self-pay | Admitting: Internal Medicine

## 2013-11-25 ENCOUNTER — Other Ambulatory Visit (HOSPITAL_COMMUNITY)
Admission: RE | Admit: 2013-11-25 | Discharge: 2013-11-25 | Disposition: A | Discharge status: Home / Self Care | Payer: Medicare HMO | Source: Ambulatory Visit | Attending: Internal Medicine | Admitting: Internal Medicine

## 2013-11-25 ENCOUNTER — Ambulatory Visit (INDEPENDENT_AMBULATORY_CARE_PROVIDER_SITE_OTHER): Payer: Medicare HMO | Admitting: Internal Medicine

## 2013-11-25 VITALS — BP 122/66 | HR 70 | Temp 97.4°F | Resp 12 | Ht 65.0 in

## 2013-11-25 DIAGNOSIS — Z113 Encounter for screening for infections with a predominantly sexual mode of transmission: Secondary | ICD-10-CM | POA: Insufficient documentation

## 2013-11-25 DIAGNOSIS — R87619 Unspecified abnormal cytological findings in specimens from cervix uteri: Secondary | ICD-10-CM | POA: Insufficient documentation

## 2013-11-25 DIAGNOSIS — N760 Acute vaginitis: Secondary | ICD-10-CM | POA: Insufficient documentation

## 2013-11-25 DIAGNOSIS — Z1151 Encounter for screening for human papillomavirus (HPV): Secondary | ICD-10-CM | POA: Insufficient documentation

## 2013-11-25 DIAGNOSIS — Z124 Encounter for screening for malignant neoplasm of cervix: Secondary | ICD-10-CM | POA: Insufficient documentation

## 2013-11-25 DIAGNOSIS — N950 Postmenopausal bleeding: Secondary | ICD-10-CM

## 2013-11-25 NOTE — Assessment & Plan Note (Signed)
Symptoms and exam are concerning for endometrial carcinoma. GYN evaluation with endometrial biopsy and pelvic ultrasound scheduled for this afternoon.

## 2013-11-25 NOTE — Progress Notes (Signed)
   Subjective:    Patient ID: [name], female    DOB: Dec 13, 1940, 73 y.o.   MRN: [id number]  HPI 73 year old female presents for acute visit complaining of vaginal bleeding. Sunday morning, woke up, had large amount of blood on sanitary napkin. Initially, she thought that the blood was coming from her urine.  However,no blood noted in urine in the toilet. Persistent blood today.No urinary urgency, frequency, dysuria. No fever, chills, flank pain. She denies abdominal pain or pressure. Her last Pap smear was several years ago. She reports normal Pap smear past. She occasionally has "mucus" discharge vaginally. No vaginal burning, itching.  Outpatient Encounter Prescriptions as of 11/25/2013  Medication Sig  . losartan (COZAAR) 50 MG tablet Take 1 tablet (50 mg total) by mouth daily.  . metoprolol succinate (TOPROL-XL) 100 MG 24 hr tablet Take 100 mg by mouth daily.   . Multiple Vitamins-Minerals (MULTIVITAMIN WITH MINERALS) tablet Take 1 tablet by mouth daily.  Marland Kitchen omeprazole (PRILOSEC) 40 MG capsule Take 40 mg by mouth daily as needed.   . topiramate (TOPAMAX) 50 MG tablet Take 50 mg by mouth daily.    BP 122/66  Pulse 70  Temp(Src) 97.4 F (36.3 C) (Oral)  Resp 12  Ht 5\' 5"  (1.651 m)  SpO2 97%  Review of Systems  Constitutional: Negative for fever, chills and fatigue.  Gastrointestinal: Negative for nausea, abdominal pain, diarrhea, constipation, blood in stool and abdominal distention.  Genitourinary: Positive for vaginal bleeding and vaginal discharge. Negative for dysuria, urgency, frequency, hematuria, flank pain, decreased urine volume, difficulty urinating, genital sores, vaginal pain, menstrual problem and pelvic pain.       Objective:   Physical Exam  Constitutional: She is oriented to person, place, and time. She appears well-developed and well-nourished. No distress.  HENT:  Head: Normocephalic and atraumatic.  Right Ear: External ear normal.  Left Ear: External ear  normal.  Nose: Nose normal.  Mouth/Throat: Oropharynx is clear and moist.  Eyes: Conjunctivae are normal. Pupils are equal, round, and reactive to light. Right eye exhibits no discharge. Left eye exhibits no discharge. No scleral icterus.  Neck: Normal range of motion. Neck supple. No tracheal deviation present. No thyromegaly present.  Pulmonary/Chest: Effort normal. No accessory muscle usage. Not tachypneic. She has no decreased breath sounds. She has no rhonchi.  Genitourinary: Vagina normal. There is no rash, tenderness, lesion or injury on the right labia. There is no rash, tenderness, lesion or injury on the left labia. Cervix exhibits discharge (bloody). Cervix exhibits no motion tenderness and no friability. No erythema around the vagina. No signs of injury around the vagina.    Musculoskeletal: Normal range of motion. She exhibits no edema and no tenderness.  Lymphadenopathy:    She has no cervical adenopathy.  Neurological: She is alert and oriented to person, place, and time. No cranial nerve deficit. She exhibits normal muscle tone. Coordination normal.  Skin: Skin is warm and dry. No rash noted. She is not diaphoretic. No erythema. No pallor.  Psychiatric: She has a normal mood and affect. Her behavior is normal. Judgment and thought content normal.          Assessment & Plan:

## 2013-11-25 NOTE — Assessment & Plan Note (Signed)
Patient notes recent  vaginal discharge. Exam is normal today except for vaginal bleeding. Will, however, send culture today.

## 2013-11-27 ENCOUNTER — Encounter: Payer: Self-pay | Admitting: Internal Medicine

## 2013-11-27 NOTE — Progress Notes (Signed)
Done

## 2013-12-03 ENCOUNTER — Ambulatory Visit: Payer: Self-pay | Admitting: Gynecologic Oncology

## 2013-12-05 ENCOUNTER — Encounter: Payer: Self-pay | Admitting: Internal Medicine

## 2013-12-10 ENCOUNTER — Encounter: Payer: Self-pay | Admitting: Internal Medicine

## 2013-12-10 ENCOUNTER — Ambulatory Visit: Payer: Self-pay | Admitting: Gynecologic Oncology

## 2013-12-15 ENCOUNTER — Ambulatory Visit: Payer: Self-pay | Admitting: Gynecologic Oncology

## 2013-12-15 ENCOUNTER — Ambulatory Visit: Payer: Self-pay

## 2013-12-16 ENCOUNTER — Ambulatory Visit: Payer: Self-pay | Admitting: Gynecologic Oncology

## 2013-12-16 LAB — CBC
HCT: 39.1 % (ref 35.0–47.0)
HGB: 12.9 g/dL (ref 12.0–16.0)
MCH: 29.5 pg (ref 26.0–34.0)
MCHC: 32.9 g/dL (ref 32.0–36.0)
MCV: 90 fL (ref 80–100)
PLATELETS: 237 10*3/uL (ref 150–440)
RBC: 4.37 10*6/uL (ref 3.80–5.20)
RDW: 14.3 % (ref 11.5–14.5)
WBC: 6.5 10*3/uL (ref 3.6–11.0)

## 2013-12-16 LAB — BASIC METABOLIC PANEL
Anion Gap: 2 — ABNORMAL LOW (ref 7–16)
BUN: 19 mg/dL — AB (ref 7–18)
Calcium, Total: 9.2 mg/dL (ref 8.5–10.1)
Chloride: 108 mmol/L — ABNORMAL HIGH (ref 98–107)
Co2: 28 mmol/L (ref 21–32)
Creatinine: 0.93 mg/dL (ref 0.60–1.30)
EGFR (Non-African Amer.): >60
Glucose: 107 mg/dL — ABNORMAL HIGH (ref 65–99)
Osmolality: 278 (ref 275–301)
Potassium: 4.2 mmol/L (ref 3.5–5.1)
Sodium: 138 mmol/L (ref 136–145)

## 2013-12-24 ENCOUNTER — Inpatient Hospital Stay: Payer: Self-pay | Admitting: Obstetrics and Gynecology

## 2013-12-25 LAB — HEMOGLOBIN: HGB: 11.8 g/dL — ABNORMAL LOW (ref 12.0–16.0)

## 2013-12-27 ENCOUNTER — Encounter: Payer: Self-pay | Admitting: Internal Medicine

## 2013-12-30 ENCOUNTER — Encounter: Payer: Self-pay | Admitting: Internal Medicine

## 2013-12-30 ENCOUNTER — Telehealth: Payer: Self-pay | Admitting: *Deleted

## 2013-12-30 ENCOUNTER — Ambulatory Visit (INDEPENDENT_AMBULATORY_CARE_PROVIDER_SITE_OTHER): Payer: Medicare HMO | Admitting: Internal Medicine

## 2013-12-30 VITALS — BP 150/80 | HR 72 | Temp 97.5°F

## 2013-12-30 DIAGNOSIS — Z8542 Personal history of malignant neoplasm of other parts of uterus: Secondary | ICD-10-CM | POA: Insufficient documentation

## 2013-12-30 DIAGNOSIS — J209 Acute bronchitis, unspecified: Secondary | ICD-10-CM | POA: Insufficient documentation

## 2013-12-30 DIAGNOSIS — C541 Malignant neoplasm of endometrium: Secondary | ICD-10-CM

## 2013-12-30 DIAGNOSIS — C549 Malignant neoplasm of corpus uteri, unspecified: Secondary | ICD-10-CM

## 2013-12-30 LAB — PATHOLOGY REPORT

## 2013-12-30 MED ORDER — LEVOFLOXACIN 500 MG PO TABS: 500.0000 mg | ORAL_TABLET | Freq: Every day | ORAL | Status: DC

## 2013-12-30 NOTE — Assessment & Plan Note (Signed)
Symptoms most consistent with acute bronchitis, however pt was recently intubated for surgery, so aspiration pneumonia and/or pneumonitis also consideration. Will start Levaquin. Encouraged rest, adequate fluid intake. Follow up 1 week or sooner if symptoms are not improving.

## 2013-12-30 NOTE — Assessment & Plan Note (Signed)
S/p hysterectomy, doing well. Will request notes on path report.

## 2013-12-30 NOTE — Progress Notes (Signed)
   Subjective:    Patient ID: [name], female    DOB: 1941-03-20, 73 y.o.   MRN: [id number]  HPI 73YO female presents for acute visit. S/p recent hysterectomy for endometrial cancer last Tuesday. No pain postop. Tolerated procedure well. Concerned about productive cough x 3-4 days, productive of purulent sputum. No fever, chills, dyspnea. Notes sinus drainage and pressure.  Review of Systems  Constitutional: Negative for fever, chills and unexpected weight change.  HENT: Positive for congestion, postnasal drip and rhinorrhea. Negative for ear discharge, ear pain, facial swelling, hearing loss, mouth sores, nosebleeds, sinus pressure, sneezing, sore throat, tinnitus, trouble swallowing and voice change.   Eyes: Negative for pain, discharge, redness and visual disturbance.  Respiratory: Positive for cough and shortness of breath. Negative for chest tightness, wheezing and stridor.   Cardiovascular: Negative for chest pain, palpitations and leg swelling.  Musculoskeletal: Negative for arthralgias, myalgias, neck pain and neck stiffness.  Skin: Negative for color change and rash.  Neurological: Negative for dizziness, weakness, light-headedness and headaches.  Hematological: Negative for adenopathy.       Objective:    BP 150/80  Pulse 72  Temp(Src) 97.5 F (36.4 C) (Oral)  SpO2 97% Physical Exam  Constitutional: She is oriented to person, place, and time. She appears well-developed and well-nourished. No distress.  HENT:  Head: Normocephalic and atraumatic.  Right Ear: External ear normal.  Left Ear: External ear normal.  Nose: Nose normal.  Mouth/Throat: Oropharynx is clear and moist. No oropharyngeal exudate.  Eyes: Conjunctivae are normal. Pupils are equal, round, and reactive to light. Right eye exhibits no discharge. Left eye exhibits no discharge. No scleral icterus.  Neck: Normal range of motion. Neck supple. No tracheal deviation present. No thyromegaly present.    Cardiovascular: Normal rate, regular rhythm, normal heart sounds and intact distal pulses.  Exam reveals no gallop and no friction rub.   No murmur heard. Pulmonary/Chest: Effort normal. No accessory muscle usage. Not tachypneic. No respiratory distress. She has no decreased breath sounds. She has no wheezes. She has rhonchi in the right middle field. She has no rales. She exhibits no tenderness.  Musculoskeletal: Normal range of motion. She exhibits no edema and no tenderness.  Lymphadenopathy:    She has no cervical adenopathy.  Neurological: She is alert and oriented to person, place, and time. No cranial nerve deficit. She exhibits normal muscle tone. Coordination normal.  Skin: Skin is warm and dry. No rash noted. She is not diaphoretic. No erythema. No pallor.  Psychiatric: She has a normal mood and affect. Her behavior is normal. Judgment and thought content normal.          Assessment & Plan:   Problem List Items Addressed This Visit   Acute bronchitis - Primary     Symptoms most consistent with acute bronchitis, however pt was recently intubated for surgery, so aspiration pneumonia and/or pneumonitis also consideration. Will start Levaquin. Encouraged rest, adequate fluid intake. Follow up 1 week or sooner if symptoms are not improving.    Relevant Medications      levofloxacin (LEVAQUIN) tablet   Endometrial cancer     S/p hysterectomy, doing well. Will request notes on path report.        Return in about 1 week (around 01/06/2014) for Recheck.

## 2013-12-30 NOTE — Telephone Encounter (Signed)
Called and spoke with patient, she came in for visit today to be evaluated.

## 2013-12-30 NOTE — Telephone Encounter (Signed)
We can see her now.

## 2013-12-30 NOTE — Telephone Encounter (Signed)
Patient left a message on voicemail stating she really needs to be seen today. Anyway she could be seen or something called in, if not could Dr. Gilford Rile please give her a call

## 2014-01-02 ENCOUNTER — Encounter: Payer: Self-pay | Admitting: Internal Medicine

## 2014-01-09 ENCOUNTER — Ambulatory Visit: Payer: Medicare HMO | Admitting: Internal Medicine

## 2014-01-15 ENCOUNTER — Encounter: Payer: Self-pay | Admitting: Internal Medicine

## 2014-01-21 ENCOUNTER — Encounter: Payer: Self-pay | Admitting: Internal Medicine

## 2014-01-21 NOTE — Telephone Encounter (Signed)
Last mammogram from [facility] requested.

## 2014-01-28 ENCOUNTER — Encounter: Payer: Self-pay | Admitting: Internal Medicine

## 2014-01-28 ENCOUNTER — Ambulatory Visit: Payer: Self-pay | Admitting: Gynecologic Oncology

## 2014-01-28 ENCOUNTER — Ambulatory Visit: Payer: Medicare HMO | Admitting: Internal Medicine

## 2014-02-07 ENCOUNTER — Encounter: Payer: Self-pay | Admitting: Internal Medicine

## 2014-02-12 ENCOUNTER — Encounter: Payer: Self-pay | Admitting: Internal Medicine

## 2014-02-12 ENCOUNTER — Ambulatory Visit: Payer: Self-pay | Admitting: Gynecologic Oncology

## 2014-02-21 ENCOUNTER — Ambulatory Visit: Payer: Self-pay | Admitting: Radiation Oncology

## 2014-02-27 ENCOUNTER — Telehealth: Payer: Self-pay | Admitting: Emergency Medicine

## 2014-02-27 NOTE — Telephone Encounter (Signed)
Referral underway to [facility] for Dr. Baruch Gouty

## 2014-03-14 ENCOUNTER — Ambulatory Visit: Payer: Self-pay | Admitting: Radiation Oncology

## 2014-03-14 ENCOUNTER — Ambulatory Visit: Payer: Self-pay | Admitting: Gynecologic Oncology

## 2014-03-17 ENCOUNTER — Encounter: Payer: Self-pay | Admitting: Internal Medicine

## 2014-03-28 ENCOUNTER — Encounter: Payer: Self-pay | Admitting: Adult Health

## 2014-03-28 ENCOUNTER — Ambulatory Visit (INDEPENDENT_AMBULATORY_CARE_PROVIDER_SITE_OTHER): Payer: Medicare HMO | Admitting: Adult Health

## 2014-03-28 VITALS — BP 126/70 | HR 55 | Temp 97.5°F | Resp 14

## 2014-03-28 DIAGNOSIS — N750 Cyst of Bartholin's gland: Secondary | ICD-10-CM | POA: Insufficient documentation

## 2014-03-28 MED ORDER — CEPHALEXIN 500 MG PO CAPS: 500.0000 mg | ORAL_CAPSULE | Freq: Three times a day (TID) | ORAL | Status: DC

## 2014-03-28 NOTE — Patient Instructions (Signed)
  Start Cephalexin 500 mg three times a day for 7 days.  Sitz baths or warm compresses to aid drainage.  Please call if no improvement within 4-5 days or sooner if necessary.

## 2014-03-28 NOTE — Progress Notes (Signed)
Pre visit review using our clinic review tool, if applicable. No additional management support is needed unless otherwise documented below in the visit note. 

## 2014-03-28 NOTE — Progress Notes (Signed)
   Subjective:    Patient ID: [name], female    DOB: 1940-11-17, 73 y.o.   MRN: [id number]  HPI Patient is a pleasant 73 year old female who presents to clinic with concerns of having a painful area on the opening of her vagina. She has noticed a bump in this area. She reports that it is slightly improved today. She has not used anything on this area. She denies any injury. There are no other symptoms associated.  Past Medical History  Diagnosis Date  . Hypertension   . IBS (irritable bowel syndrome)   . Arthritis     bitateral knees and left foot,s/p cortisone injection in past  . Menopause     age 73  . Chronic constipation   . Vitamin D deficiency 06/07/10    Review of Systems  Genitourinary: Positive for vaginal pain (opening is irritated. Tender area).  All other systems reviewed and are negative.      Objective:   Physical Exam  Genitourinary:    There is no rash, tenderness, lesion or injury on the right labia. There is no rash, tenderness, lesion or injury on the left labia. There is tenderness around the vagina.       Assessment & Plan:   1. Bartholin cyst Keflex 500 mg tid x 7 days Sitz bath or warm compresses 2-3 times daily RTC if no improvement within 4-5 days.

## 2014-04-10 ENCOUNTER — Other Ambulatory Visit: Payer: Self-pay | Admitting: Internal Medicine

## 2014-04-14 ENCOUNTER — Ambulatory Visit: Payer: Self-pay | Admitting: Radiation Oncology

## 2014-04-24 ENCOUNTER — Encounter: Payer: Self-pay | Admitting: Internal Medicine

## 2014-04-24 DIAGNOSIS — Z0100 Encounter for examination of eyes and vision without abnormal findings: Secondary | ICD-10-CM

## 2014-05-06 ENCOUNTER — Encounter: Payer: Self-pay | Admitting: Internal Medicine

## 2014-05-23 ENCOUNTER — Encounter: Payer: Self-pay | Admitting: Internal Medicine

## 2014-05-27 ENCOUNTER — Encounter: Payer: Self-pay | Admitting: Internal Medicine

## 2014-05-27 ENCOUNTER — Ambulatory Visit (INDEPENDENT_AMBULATORY_CARE_PROVIDER_SITE_OTHER): Payer: Medicare HMO | Admitting: Internal Medicine

## 2014-05-27 VITALS — BP 108/60 | HR 58 | Temp 98.2°F | Ht 65.0 in

## 2014-05-27 DIAGNOSIS — I10 Essential (primary) hypertension: Secondary | ICD-10-CM

## 2014-05-27 DIAGNOSIS — K5909 Other constipation: Secondary | ICD-10-CM

## 2014-05-27 DIAGNOSIS — K5900 Constipation, unspecified: Secondary | ICD-10-CM

## 2014-05-27 MED ORDER — LINACLOTIDE 145 MCG PO CAPS: 145.0000 ug | ORAL_CAPSULE | Freq: Every day | ORAL | Status: DC

## 2014-05-27 NOTE — Progress Notes (Signed)
Pre visit review using our clinic review tool, if applicable. No additional management support is needed unless otherwise documented below in the visit note. 

## 2014-05-27 NOTE — Patient Instructions (Signed)
Linaclotide oral capsules What is this medicine? LINACLOTIDE (lin a KLOE tide) is used to treat irritable bowel syndrome (IBS) with constipation as the main problem. It may also be used for relief of chronic constipation. This medicine may be used for other purposes; ask your health care provider or pharmacist if you have questions. COMMON BRAND NAME(S): Linzess What should I tell my health care provider before I take this medicine? They need to know if you have any of these conditions: -history of stool (fecal) impaction -now have diarrhea or have diarrhea often -other medical condition -stomach or intestinal disease, including bowel obstruction or abdominal adhesions -an unusual or allergic reaction to linaclotide, other medicines, foods, dyes, or preservatives -pregnant or trying to get pregnant -breast-feeding How should I use this medicine? Take this medicine by mouth with a glass of water. Follow the directions on the prescription label. Do not cut, crush or chew this medicine. Take on an empty stomach, at least 30 minutes before your first meal of the day. Take your medicine at regular intervals. Do not take your medicine more often than directed. Do not stop taking except on your doctor's advice. A special MedGuide will be given to you by the pharmacist with each prescription and refill. Be sure to read this information carefully each time. Talk to your pediatrician regarding the use of this medicine in children. This medicine is not approved for use in children. Overdosage: If you think you've taken too much of this medicine contact a poison control center or emergency room at once. Overdosage: If you think you have taken too much of this medicine contact a poison control center or emergency room at once. NOTE: This medicine is only for you. Do not share this medicine with others. What if I miss a dose? If you miss a dose, just skip that dose. Wait until your next dose, and take only  that dose. Do not take double or extra doses. What may interact with this medicine? -certain medicines for bowel problems or bladder incontinence (these can cause constipation) This list may not describe all possible interactions. Give your health care provider a list of all the medicines, herbs, non-prescription drugs, or dietary supplements you use. Also tell them if you smoke, drink alcohol, or use illegal drugs. Some items may interact with your medicine. What should I watch for while using this medicine? Visit your doctor for regular check ups. Tell your doctor if your symptoms do not get better or if they get worse. Diarrhea is a common side effect of this medicine. It often begins within 2 weeks of starting this medicine. Stop taking this medicine and call your doctor if you get severe diarrhea. Stop taking this medicine and call your doctor or go to the nearest hospital emergency room right away if you develop unusual or severe stomach-area (abdominal) pain, especially if you also have bright red, bloody stools or black stools that look like tar. What side effects may I notice from receiving this medicine? Side effects that you should report to your doctor or health care professional as soon as possible: -allergic reactions like skin rash, itching or hives, swelling of the face, lips, or tongue -black, tarry stools -bloody or watery diarrhea -new or worsening stomach pain -severe or prolonged diarrhea Side effects that usually do not require medical attention (Report these to your doctor or health care professional if they continue or are bothersome.): -bloating -gas -loose stools This list may not describe all possible side effects.  Call your doctor for medical advice about side effects. You may report side effects to FDA at 1-800-FDA-1088. Where should I keep my medicine? Keep out of the reach of children. Store at room temperature between 20 and 25 degrees C (68 and 77 degrees F).  Keep this medicine in the original container. Keep tightly closed in a dry place. Do not remove the desiccant packet from the bottle, it helps to protect your medicine from moisture. Throw away any unused medicine after the expiration date. NOTE: This sheet is a summary. It may not cover all possible information. If you have questions about this medicine, talk to your doctor, pharmacist, or health care provider.  2015, [employer]vier/Gold Standard. (2011-07-19 13:05:27)  

## 2014-05-27 NOTE — Assessment & Plan Note (Signed)
  BP Readings from Last 3 Encounters:  05/27/14 108/60  03/28/14 126/70  12/30/13 150/80   BP well controlled on Metoprolol and Losartan. Will continue. Will check renal function with labs today.

## 2014-05-27 NOTE — Progress Notes (Signed)
Subjective:    Patient ID: [name], female    DOB: [birth year], 73 y.o.   MRN: [id number]  HPI 73YO female presents for follow up.  Having constipation every since surgery (hysterectomy). Having hard stools, about 1x per week. No improvement with use of Miralax and Colace. Taking magnesium 400mg , 2pills bid with some improvement. Tried cutting back with recurrent constipation. Reports issues with constipation since childhood.  Has follow up scheduled with her [facility] provider next week.  Review of Systems  Constitutional: Negative for fever, chills, appetite change, fatigue and unexpected weight change.  Eyes: Negative for visual disturbance.  Respiratory: Negative for shortness of breath.   Cardiovascular: Negative for chest pain and leg swelling.  Gastrointestinal: Positive for constipation. Negative for nausea, vomiting, abdominal pain, diarrhea, blood in stool and rectal pain.  Skin: Negative for color change and rash.  Hematological: Negative for adenopathy. Does not bruise/bleed easily.  Psychiatric/Behavioral: Negative for dysphoric mood. The patient is not nervous/anxious.        Objective:    BP 108/60  Pulse 58  Temp(Src) 98.2 F (36.8 C) (Oral)  Ht 5\' 5"  (1.651 m)  SpO2 96% Physical Exam  Constitutional: She is oriented to person, place, and time. She appears well-developed and well-nourished. No distress.  HENT:  Head: Normocephalic and atraumatic.  Right Ear: External ear normal.  Left Ear: External ear normal.  Nose: Nose normal.  Mouth/Throat: Oropharynx is clear and moist. No oropharyngeal exudate.  Eyes: Conjunctivae are normal. Pupils are equal, round, and reactive to light. Right eye exhibits no discharge. Left eye exhibits no discharge. No scleral icterus.  Neck: Normal range of motion. Neck supple. No tracheal deviation present. No thyromegaly present.  Cardiovascular: Normal rate, regular rhythm, normal heart sounds and intact distal pulses.  Exam  reveals no gallop and no friction rub.   No murmur heard. Pulmonary/Chest: Effort normal and breath sounds normal. No respiratory distress. She has no wheezes. She has no rales. She exhibits no tenderness.  Abdominal: Soft. Bowel sounds are normal. She exhibits no distension and no mass. There is no tenderness. There is no rebound and no guarding.  Musculoskeletal: Normal range of motion. She exhibits no edema and no tenderness.  Lymphadenopathy:    She has no cervical adenopathy.  Neurological: She is alert and oriented to person, place, and time. No cranial nerve deficit. She exhibits normal muscle tone. Coordination normal.  Skin: Skin is warm and dry. No rash noted. She is not diaphoretic. No erythema. No pallor.  Psychiatric: She has a normal mood and affect. Her behavior is normal. Judgment and thought content normal.          Assessment & Plan:   Problem List Items Addressed This Visit     Unprioritized   Chronic constipation - Primary     Chronic constipation. No improvement with Miralax, Colace. Will try adding Linzess. If no improvement, plan GI evaluation. Encouraged her to have evaluation with GI now for colonoscopy as she is overdue, however she declines.    Relevant Medications      linaclotide (LINZESS) capsule   Other Relevant Orders      Comprehensive metabolic panel   Essential hypertension, benign       BP Readings from Last 3 Encounters:  05/27/14 108/60  03/28/14 126/70  12/30/13 150/80   BP well controlled on Metoprolol and Losartan. Will continue. Will check renal function with labs today.    Relevant Orders  Lipid panel      Microalbumin / creatinine urine ratio       Return in about 4 weeks (around 06/24/2014) for Recheck.

## 2014-05-27 NOTE — Assessment & Plan Note (Signed)
Chronic constipation. No improvement with Miralax, Colace. Will try adding Linzess. If no improvement, plan GI evaluation. Encouraged her to have evaluation with GI now for colonoscopy as she is overdue, however she declines.

## 2014-05-28 ENCOUNTER — Encounter: Payer: Self-pay | Admitting: Internal Medicine

## 2014-05-28 LAB — COMPREHENSIVE METABOLIC PANEL
ALT: 12 U/L (ref 0–35)
AST: 20 U/L (ref 0–37)
Albumin: 3.8 g/dL (ref 3.5–5.2)
Alkaline Phosphatase: 90 U/L (ref 39–117)
BILIRUBIN TOTAL: 0.7 mg/dL (ref 0.2–1.2)
BUN: 17 mg/dL (ref 6–23)
CHLORIDE: 108 meq/L (ref 96–112)
CO2: 25 meq/L (ref 19–32)
Calcium: 9.4 mg/dL (ref 8.4–10.5)
Creatinine, Ser: 0.8 mg/dL (ref 0.4–1.2)
GFR: 72.61 mL/min (ref >60.00)
Glucose, Bld: 101 mg/dL — ABNORMAL HIGH (ref 70–99)
Potassium: 4.4 mEq/L (ref 3.5–5.1)
SODIUM: 139 meq/L (ref 135–145)
Total Protein: 7.2 g/dL (ref 6.0–8.3)

## 2014-05-28 LAB — LIPID PANEL
Cholesterol: 219 mg/dL — ABNORMAL HIGH (ref 0–200)
HDL: 66.1 mg/dL (ref >39.00)
LDL Cholesterol: 131 mg/dL — ABNORMAL HIGH (ref 0–99)
NonHDL: 152.9
Total CHOL/HDL Ratio: 3
Triglycerides: 110 mg/dL (ref 0.0–149.0)
VLDL: 22 mg/dL (ref 0.0–40.0)

## 2014-05-28 LAB — MICROALBUMIN / CREATININE URINE RATIO
Creatinine,U: 176.8 mg/dL
MICROALB/CREAT RATIO: 0.5 mg/g (ref 0.0–30.0)
Microalb, Ur: 0.9 mg/dL (ref 0.0–1.9)

## 2014-06-03 ENCOUNTER — Ambulatory Visit: Payer: Medicare HMO | Admitting: Internal Medicine

## 2014-06-03 ENCOUNTER — Encounter: Payer: Self-pay | Admitting: Internal Medicine

## 2014-06-10 ENCOUNTER — Ambulatory Visit: Payer: Self-pay | Admitting: Gynecologic Oncology

## 2014-06-17 ENCOUNTER — Ambulatory Visit: Payer: Medicare HMO | Admitting: Internal Medicine

## 2014-06-19 ENCOUNTER — Ambulatory Visit: Payer: Medicare HMO | Admitting: Internal Medicine

## 2014-07-01 ENCOUNTER — Ambulatory Visit: Payer: Self-pay | Admitting: Radiation Oncology

## 2014-07-02 ENCOUNTER — Encounter: Payer: Self-pay | Admitting: Internal Medicine

## 2014-07-03 ENCOUNTER — Encounter: Payer: Self-pay | Admitting: Internal Medicine

## 2014-07-06 ENCOUNTER — Encounter: Payer: Self-pay | Admitting: Internal Medicine

## 2014-07-07 ENCOUNTER — Ambulatory Visit (INDEPENDENT_AMBULATORY_CARE_PROVIDER_SITE_OTHER): Payer: Medicare HMO | Admitting: Internal Medicine

## 2014-07-07 ENCOUNTER — Encounter: Payer: Self-pay | Admitting: Internal Medicine

## 2014-07-07 VITALS — BP 136/70 | HR 70 | Temp 98.0°F | Ht 65.0 in

## 2014-07-07 DIAGNOSIS — J209 Acute bronchitis, unspecified: Secondary | ICD-10-CM

## 2014-07-07 MED ORDER — LEVOFLOXACIN 500 MG PO TABS: 500.0000 mg | ORAL_TABLET | Freq: Every day | ORAL | Status: DC

## 2014-07-07 NOTE — Progress Notes (Signed)
   Subjective:    Patient ID: [name], female    DOB: [birth year], 73 y.o.   MRN: [id number]  HPI 73YO female presents for acute visit.  Developed sinus congestion about 1 week ago. Deep cough started yesterday. Bringing up thick yellow mucous with cough. No dyspnea. Occasional wheeze. No fever or chills. Taking OTC decongestants and mucolytics with minimal improvement.  Review of Systems  Constitutional: Positive for fatigue. Negative for fever, chills and unexpected weight change.  HENT: Positive for congestion, postnasal drip and rhinorrhea. Negative for ear discharge, ear pain, facial swelling, hearing loss, mouth sores, nosebleeds, sinus pressure, sneezing, sore throat, tinnitus, trouble swallowing and voice change.   Eyes: Negative for pain, discharge, redness and visual disturbance.  Respiratory: Positive for cough and wheezing. Negative for chest tightness, shortness of breath and stridor.   Cardiovascular: Negative for chest pain, palpitations and leg swelling.  Musculoskeletal: Negative for arthralgias, myalgias, neck pain and neck stiffness.  Skin: Negative for color change and rash.  Neurological: Negative for dizziness, weakness, light-headedness and headaches.  Hematological: Negative for adenopathy.       Objective:    BP 136/70  Pulse 70  Temp(Src) 98 F (36.7 C) (Oral)  Ht 5\' 5"  (1.651 m)  SpO2 97% Physical Exam  Constitutional: She is oriented to person, place, and time. She appears well-developed and well-nourished. No distress.  HENT:  Head: Normocephalic and atraumatic.  Right Ear: External ear normal.  Left Ear: External ear normal.  Nose: Nose normal.  Mouth/Throat: Oropharynx is clear and moist. No oropharyngeal exudate.  Eyes: Conjunctivae are normal. Pupils are equal, round, and reactive to light. Right eye exhibits no discharge. Left eye exhibits no discharge. No scleral icterus.  Neck: Normal range of motion. Neck supple. No tracheal deviation  present. No thyromegaly present.  Cardiovascular: Normal rate, regular rhythm, normal heart sounds and intact distal pulses.  Exam reveals no gallop and no friction rub.   No murmur heard. Pulmonary/Chest: Effort normal. No accessory muscle usage. Not tachypneic. No respiratory distress. She has no decreased breath sounds. She has wheezes (few). She has rhonchi (scattered). She has no rales. She exhibits no tenderness.  Musculoskeletal: Normal range of motion. She exhibits no edema and no tenderness.  Lymphadenopathy:    She has no cervical adenopathy.  Neurological: She is alert and oriented to person, place, and time. No cranial nerve deficit. She exhibits normal muscle tone. Coordination normal.  Skin: Skin is warm and dry. No rash noted. She is not diaphoretic. No erythema. No pallor.  Psychiatric: She has a normal mood and affect. Her behavior is normal. Judgment and thought content normal.          Assessment & Plan:   Problem List Items Addressed This Visit     Unprioritized   Acute bronchitis - Primary     Symptoms and exam c/w acute bronchitis. Will start Levaquin. Continue rest, adequate fluids and prn OTC  Mucolytics. Follow up in 4 weeks and sooner as needed.    Relevant Medications      levofloxacin (LEVAQUIN) tablet       Return in about 4 weeks (around 08/04/2014), or if symptoms worsen or fail to improve.

## 2014-07-07 NOTE — Progress Notes (Signed)
Pre visit review using our clinic review tool, if applicable. No additional management support is needed unless otherwise documented below in the visit note. 

## 2014-07-07 NOTE — Patient Instructions (Signed)

## 2014-07-07 NOTE — Assessment & Plan Note (Signed)
Symptoms and exam c/w acute bronchitis. Will start Levaquin. Continue rest, adequate fluids and prn OTC  Mucolytics. Follow up in 4 weeks and sooner as needed.

## 2014-07-09 ENCOUNTER — Ambulatory Visit: Payer: Medicare HMO | Admitting: Internal Medicine

## 2014-07-15 ENCOUNTER — Ambulatory Visit: Payer: Self-pay | Admitting: Radiation Oncology

## 2014-08-08 ENCOUNTER — Encounter: Payer: Self-pay | Admitting: Internal Medicine

## 2014-08-08 DIAGNOSIS — R430 Anosmia: Secondary | ICD-10-CM

## 2014-08-21 ENCOUNTER — Ambulatory Visit: Payer: Self-pay | Admitting: Internal Medicine

## 2014-08-21 LAB — HM MAMMOGRAPHY: HM Mammogram: NEGATIVE

## 2014-08-22 ENCOUNTER — Encounter: Payer: Self-pay | Admitting: *Deleted

## 2014-08-23 ENCOUNTER — Ambulatory Visit: Payer: Self-pay | Admitting: Otolaryngology

## 2014-09-11 ENCOUNTER — Encounter: Payer: Self-pay | Admitting: Internal Medicine

## 2014-09-12 ENCOUNTER — Encounter: Payer: Self-pay | Admitting: Internal Medicine

## 2014-09-26 ENCOUNTER — Encounter: Payer: Self-pay | Admitting: Internal Medicine

## 2014-09-26 ENCOUNTER — Ambulatory Visit (INDEPENDENT_AMBULATORY_CARE_PROVIDER_SITE_OTHER): Payer: Medicare HMO | Admitting: Internal Medicine

## 2014-09-26 VITALS — BP 118/78 | HR 65 | Temp 98.2°F | Ht 65.0 in

## 2014-09-26 DIAGNOSIS — Z0000 Encounter for general adult medical examination without abnormal findings: Secondary | ICD-10-CM | POA: Insufficient documentation

## 2014-09-26 DIAGNOSIS — Z01818 Encounter for other preprocedural examination: Secondary | ICD-10-CM

## 2014-09-26 NOTE — Progress Notes (Signed)
   Subjective:    Patient ID: [name], female    DOB: 1941/04/26, 73 y.o.   MRN: [id number]  HPI 73YO female presents for surgical clearance.  Sinus surgery scheduled for Dec 3rd.  No recent illness. No fever, chills. With previous surgery, no issues with bleeding. Has some nausea with anesthesia and prolonged time to wake. Has appt with anesthesia on 11/18.  Had recent blood work at [facility] and scheduled to have EKG performed.  Review of Systems  Constitutional: Negative for fever, chills, appetite change, fatigue and unexpected weight change.  Eyes: Negative for visual disturbance.  Respiratory: Negative for shortness of breath.   Cardiovascular: Negative for chest pain and leg swelling.  Gastrointestinal: Negative for nausea, vomiting, abdominal pain, diarrhea and constipation.  Skin: Negative for color change and rash.  Hematological: Negative for adenopathy. Does not bruise/bleed easily.  Psychiatric/Behavioral: Negative for dysphoric mood. The patient is not nervous/anxious.        Objective:    BP 118/78 mmHg  Pulse 65  Temp(Src) 98.2 F (36.8 C) (Oral)  Ht 5\' 5"  (1.651 m)  Wt   SpO2 97% Physical Exam  Constitutional: She is oriented to person, place, and time. She appears well-developed and well-nourished. No distress.  HENT:  Head: Normocephalic and atraumatic.  Right Ear: External ear normal.  Left Ear: External ear normal.  Nose: Nose normal.  Mouth/Throat: Oropharynx is clear and moist. No oropharyngeal exudate.  Eyes: Conjunctivae are normal. Pupils are equal, round, and reactive to light. Right eye exhibits no discharge. Left eye exhibits no discharge. No scleral icterus.  Neck: Normal range of motion. Neck supple. No tracheal deviation present. No thyromegaly present.  Cardiovascular: Normal rate, regular rhythm, normal heart sounds and intact distal pulses.  Exam reveals no gallop and no friction rub.   No murmur heard. Pulmonary/Chest: Effort normal  and breath sounds normal. No respiratory distress. She has no wheezes. She has no rales. She exhibits no tenderness.  Musculoskeletal: Normal range of motion. She exhibits no edema or tenderness.  Lymphadenopathy:    She has no cervical adenopathy.  Neurological: She is alert and oriented to person, place, and time. No cranial nerve deficit. She exhibits normal muscle tone. Coordination normal.  Skin: Skin is warm and dry. No rash noted. She is not diaphoretic. No erythema. No pallor.  Psychiatric: She has a normal mood and affect. Her behavior is normal. Judgment and thought content normal.          Assessment & Plan:   Problem List Items Addressed This Visit      Unprioritized   Preop examination - Primary    General exam normal today. No recent illnesses. Previous history of nausea with anesthesia and will need to use Scopalamine preop. Pt prefers to get EKG and labs at anesthesia visit. By modified risk criteria, she would be low risk of perioperative events. Recommend proceed with surgery.        Return in about 6 months (around 03/27/2015) for Wellness Visit.

## 2014-09-26 NOTE — Patient Instructions (Signed)
Proceed with evaluation with anesthesia prior to surgery.  Follow up here in 6 months or as needed.

## 2014-09-26 NOTE — Progress Notes (Signed)
Pre visit review using our clinic review tool, if applicable. No additional management support is needed unless otherwise documented below in the visit note. 

## 2014-09-26 NOTE — Assessment & Plan Note (Signed)
General exam normal today. No recent illnesses. Previous history of nausea with anesthesia and will need to use Scopalamine preop. Pt prefers to get EKG and labs at anesthesia visit. By modified risk criteria, she would be low risk of perioperative events. Recommend proceed with surgery.

## 2014-09-30 ENCOUNTER — Ambulatory Visit: Payer: Self-pay | Admitting: Radiation Oncology

## 2014-10-02 ENCOUNTER — Ambulatory Visit: Payer: Self-pay | Admitting: Otolaryngology

## 2014-10-14 ENCOUNTER — Ambulatory Visit: Payer: Medicare HMO | Admitting: Internal Medicine

## 2014-10-14 ENCOUNTER — Ambulatory Visit: Payer: Self-pay | Admitting: Radiation Oncology

## 2014-10-16 ENCOUNTER — Ambulatory Visit: Payer: Self-pay | Admitting: Otolaryngology

## 2014-10-17 ENCOUNTER — Ambulatory Visit: Payer: Medicare HMO | Admitting: Internal Medicine

## 2014-12-09 DIAGNOSIS — J321 Chronic frontal sinusitis: Secondary | ICD-10-CM | POA: Diagnosis not present

## 2014-12-09 DIAGNOSIS — J322 Chronic ethmoidal sinusitis: Secondary | ICD-10-CM | POA: Diagnosis not present

## 2014-12-09 DIAGNOSIS — J320 Chronic maxillary sinusitis: Secondary | ICD-10-CM | POA: Diagnosis not present

## 2014-12-27 ENCOUNTER — Other Ambulatory Visit: Payer: Self-pay | Admitting: Internal Medicine

## 2014-12-29 NOTE — Telephone Encounter (Signed)
Last OV 11.13.15 for Pre op.  However several OV cancellations. Please advise refill

## 2015-01-21 ENCOUNTER — Ambulatory Visit
Admit: 2015-01-21 | Disposition: A | Discharge status: Home / Self Care | Payer: Self-pay | Attending: Obstetrics and Gynecology | Admitting: Obstetrics and Gynecology

## 2015-01-28 DIAGNOSIS — K589 Irritable bowel syndrome without diarrhea: Secondary | ICD-10-CM | POA: Diagnosis not present

## 2015-01-28 DIAGNOSIS — C569 Malignant neoplasm of unspecified ovary: Secondary | ICD-10-CM | POA: Diagnosis not present

## 2015-01-28 DIAGNOSIS — Z79899 Other long term (current) drug therapy: Secondary | ICD-10-CM | POA: Diagnosis not present

## 2015-01-28 DIAGNOSIS — I10 Essential (primary) hypertension: Secondary | ICD-10-CM | POA: Diagnosis not present

## 2015-02-13 ENCOUNTER — Ambulatory Visit
Admit: 2015-02-13 | Disposition: A | Discharge status: Home / Self Care | Payer: Self-pay | Attending: Obstetrics and Gynecology | Admitting: Obstetrics and Gynecology

## 2015-03-07 NOTE — Consult Note (Signed)
Reason for Visit: This 74 year old Female patient presents to the clinic for initial evaluation of  endometrial cancer .   Referred by Dr. Sabra Heck.  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 Pathology difficult based on large distorting leiomyoma with tumor to 7 mm of the serosal surface for adjuvant brachy therapy  Pathology Report pathology report reviewed   Imaging Report ultrasound reviewed   Referral Report clinical data is reviewed   Planned Treatment Regimen vaginal brachytherapy   HPI   patient is a 74 year old female presented with postmenopausal bleeding found on Pap smear to have endometrial cells. Ultrasound showed endometrial thickening biopsy was positive for endometrioid in vitro carcinoma. Patient underwent a TAH/BSO showing a 7 cm FIGO grade 1 endometrioid adenocarcinoma. Uterus itself was severely distorted by multiple leiomyoma. Tumor was 7 mm from the serosal surface. No lymphovascular invasion was identified. The endometrial junction was difficult to evaluate. Cervix was not involved. Patient was staged overall 1 (T1 A. N0 M0). She is now seen postoperatively and is doing well. GYN oncology has made recommendation for vaginal brachy therapy and she is here today for opinion.  Past Hx:    HTN:    TAH/BSO:   Past, Family and Social History:  Past Medical History positive   Cardiovascular hypertension   Gastrointestinal IBS   Neurological/Psychiatric idiopathic tremor   Past Surgical History TAH/BSO as described above   Past Medical History Comments arthritis   Family History positive   Family History Comments family history of colon cancer and breast cancer in her sister.   Social History noncontributory   Additional Past Medical and Surgical History seen by herself to   Allergies:   Codeine: GI Distress  "with general anesthesia I get very nauseated and have  vomited after surgery in recovery": N/V/Diarrhea, GI Distress  "GAS AND BLOATING WITH BEANS": Other  Home Meds:  Home Medications: Medication Instructions Status  stool softner otc 1 cap(s) orally 3 times a day, As Needed - for Constipation Active  Metoprolol Succinate ER 100 mg oral tablet, extended release 1 tab(s) orally once a day (in the morning) Active  losartan 50 mg oral tablet 1 tab(s) orally once a day (in the morning) Active  topiramate 50 mg oral tablet 1 tab(s) orally 2 times a day Active  multivitamin 1 tab(s) orally once a day Active   Review of Systems:  General negative   Performance Status (ECOG) 0   Skin negative   Breast negative   Ophthalmologic negative   ENMT negative   Respiratory and Thorax negative   Cardiovascular negative   Gastrointestinal negative   Genitourinary see HPI   Musculoskeletal negative   Neurological negative   Psychiatric negative   Hematology/Lymphatics negative   Endocrine negative   Allergic/Immunologic negative   Review of Systems   review of systems obtained from nurses notes  Nursing Notes:  Nursing Vital Signs and Chemo Nursing Nursing Notes: *CC Vital Signs Flowsheet:   13-Apr-15 10:40  Temp Temperature 96.2  Pulse Pulse 56  Respirations Respirations 20  SBP SBP 147  DBP DBP 70  Pain Scale (0-10)  0  Current Weight (kg) (kg) 103  Height (cm) centimeters 169  BSA (m2) 2.1   Physical Exam:  General/Skin/HEENT:  General normal   Skin normal   Eyes normal   ENMT normal   Head and Neck normal   Additional PE well-developed female in NAD. Lungs are  clear to A&P cardiac examination shows regular rate and rhythm. On speculum examination vaginal apex is well healed. No evidence of mass or nodularity the vaginal mucosa is identified. Bimanual examination shows no evidence of mass or nodularityin the parametrium. Rectal exam is unremarkable.   Breasts/Resp/CV/GI/GU:  Respiratory and Thorax normal    Cardiovascular normal   Gastrointestinal normal   Genitourinary normal   MS/Neuro/Psych/Lymph:  Musculoskeletal normal   Neurological normal   Lymphatics normal   Assessment and Plan: Impression:   stage IAFIGO grade 1 endometrial adenocarcinoma for adjuvant vaginal brachytherapy based on large tumor size 7 mm close to the serosal surface and difficulty with pathology based on multiple benign leiomyoma. Plan:   at this time and based on the close involvement of tumor to the serosal surface of 7 mm. The difficulty with the endo-myometrial junction based on significant multiple leiomyoma, a 7 cm size of the tumor I believe it would be prudent to use vaginal brachii therapy to prevent vaginal apex recurrence. We'll plan on delivering 2350 cGy in 5 fractions of high-dose rate remote afterloading to the vaginal apex using a vaginal cylinder. Risks and benefits of treatment including possible GI side effects including diarrhea, urinary frequency urgency, scarring of the vaginal apex, fatigue were discussed in detail with the patient. I have set her up for CT simulation later this week.  I would like to take this opportunity for allowing me to participate in the care of your patient..  CC Referral:  cc: Dr. Ronette Deter   Electronic Signatures: Baruch Gouty, Roda Shutters (MD)  (Signed 13-Apr-15 12:12)  Authored: HPI, Diagnosis, Past Hx, PFSH, Allergies, Home Meds, ROS, Nursing Notes, Physical Exam, Encounter Assessment and Plan, CC Referring Physician   Last Updated: 13-Apr-15 12:12 by Armstead Peaks (MD)

## 2015-03-07 NOTE — Op Note (Signed)
PATIENT NAME:  [name], [name] MR#:  [id number] DATE OF BIRTH:  1941-01-18  DATE OF PROCEDURE:  12/24/2013  SURGEON: Weber Cooks, MS  PREOPERATIVE DIAGNOSES: Adenocarcinoma of the endometrium. Fibroid uterus.  POSTOPERATIVE DIAGNOSES: Adenocarcinoma of the endometrium. Fibroid uterus.  PROCEDURE  PERFORMED: Laparoscopy with robotic assistance for total laparoscopic hysterectomy, bilateral salpingo-oophorectomy and pelvic lymphadenectomy, mini- laparotomy for removal of the uterus  and omental biopsy.  ANESTHESIA: General  COMPLICATIONS: None.  INDICATION FOR SURGERY: [name] is a 74 year old patient who presented with postmenopausal bleeding and on endometrial biopsy was noted to have a well differentiated endometrial adenocarcinoma. Workup had failed to reveal any evidence of metastatic disease, but also showed some uterine fibroids. Therefore, decision was made to proceed with surgery.   FINDINGS AT TIME OF SURGERY: Uterus deformed by multiple subserosal fibroids. No peritoneal lesions seen in the pelvis and the upper abdomen, and no adenopathy.   OPERATIVE REPORT: After adequate general anesthesia obtained, the patient was prepped and draped in ski position. The cervix was visualized, grasped with single tooth tenaculum and dilated. A holding stitch was placed. Then, the VCare was placed into the uterus and around the cervix. A Foley catheter was inserted.  Attention was then directed towards the abdomen. A 1 cm incision was made above the umbilicus. The fascia was grabbed and transected. The peritoneum was identified and entered. The blunt trocar was inserted. Pneumoperitoneum was performed and inspection was done with the above-mentioned findings. Two robotic trocars were inserted into the upper, mid abdomen. A fourth one into the left lower quadrant and a 12 mm VersaStep assistant port into the right lower quadrant. The patient was placed in steep Trendelenburg position. The bowel  was pushed away from the pelvis. Then, the patient was attached to the robot.   Pelvic cytology was obtained. Then, the round ligament was transected on the right side.  The pelvic sidewall was entered. Vessels and ureter were identified. Infundibulopelvic pelvic ligament was cauterized and transected. The adnexa were mobilized all the way towards the uterus. The same procedure was performed on the contralateral side. Then, the anterior peritoneum was incised. The bladder was freed from lower  uterine segment, cervix and upper vagina by blunt and sharp dissection. Then, the uterine vessels were  skeletonized, cauterized and transected.  Exposure was not easy due to the multiple fibroids.  The posterior peritoneum was then incised. The uterosacral ligaments were transected. . The cervix and upper vagina were dissected free. Then, the vagina was entered anteriorly. Incision was then carried all the way around the cervix until the uterus could be mobilized completely. It was too large to remove through the vagina. Therefore, a 10 mm bag was placed over the cervix and tied tight.  The uterus was positioned into the upper abdomen.   Attention was then directed towards the lymph node sampling, which was done in a similar fashion on either side. The node bearing fatty tissue around lower common iliac vessels, external iliac vessels and from the upper obturator space were removed using the cauterizing scissors. Vessels, ureter and nerve were kept under constant visualization.  Hemostasis was adequate at the end of the procedure. Specimens and lap sponges were removed through the vagina. The vagina was then closed with interrupted figure of eight stitch using 0 Vicryl on the left side and a V-Loc suture  starting from the right extending all the way to the left with several back stitches. The pelvis was blotted dry and adequate hemostasis  confirmed in all areas. Arista  was placed in the areas of nodal dissection. The  patient was detached from the robot.   Then, all ports were removed under direct vision. There was no evidence of bleeding. Then, the  incision for the upper port was enlarged  all the way down to the umbilicus until adequate room was available to remove the uterus.  Palpation of the upper abdomen and periaortic area again was normal and omental biopsy was taken with cautery. Hemostasis was adequate. The fascia was then closed with a running looped PDS suture. Adequate hemostasis in the subcutaneous tissue was obtained before it was reapproximated with 2-0 Vicryl and 3-0 Monocryl subcuticular stitch was used to close the skin. The subcutaneous tissue  around the other port site was reapproximated with 3-0 Vicryl and the skin  was closed with Dermabond.   The patient tolerated the procedure well and was taken to recovery in stable condition. Postoperative urine was clear. The sponge, needle and instrument counts were correct x 2.    ____________________________ Weber Cooks, MD bem:sg D: 12/25/2013 07:35:28 ET T: 12/25/2013 10:00:06 ET JOB#: [id number]  cc: Weber Cooks, MD, <Dictator> Weber Cooks MD ELECTRONICALLY SIGNED 01/14/2014 19:07

## 2015-03-07 NOTE — Op Note (Signed)
PATIENT NAMEQUANNA, [name] MR#:  [id number] DATE OF BIRTH:  1941-09-12  DATE OF PROCEDURE:  10/16/2014  PREOPERATIVE DIAGNOSES:  1.  Bilateral chronic maxillary sinusitis.  2.  Bilateral chronic total ethmoid sinusitis.  3.  Left chronic frontal sinusitis.   POSTOPERATIVE DIAGNOSES:  1.  Bilateral chronic maxillary sinusitis.  2.  Bilateral chronic total ethmoid sinusitis.  3.  Left chronic frontal sinusitis.  PROCEDURE PERFORMED: 1.  A left frontal sinusotomy. 2.  Left total ethmoidectomy.  3.  Right total ethmoidectomy.   4.  Left maxillary antrostomy.  5.  Right maxillary antrostomy.  6.  Image-guided sinus surgery.   SURGEON: Carloyn Manner, MD   ANESTHESIA: General endotracheal anesthesia.   ESTIMATED BLOOD LOSS: 100 mL.   INTRAVENOUS FLUIDS: Please see anesthesia record.   COMPLICATIONS: None.   DRAINS: None.  STENT PLACEMENTS: Bilateral xerogel and Arista.   SPECIMENS: Right and left sinus.   INDICATIONS FOR PROCEDURE: The patient is a 74 year old female with history of chronic sinusitis resistant to medical management with documentation of persistent disease on CT scan.   OPERATIVE FINDINGS: Significant inflammation and polypoid disease filling the maxillary ostiomeatal complexes as well as the anterior and posterior ethmoids as well as narrowing and erythema of the left frontal sinus outflow tract.   DESCRIPTION OF PROCEDURE: The patient was identified in the holding area, benefits and risks of the procedure were discussed and consent was reviewed. The patient was taken to the operating room and placed in the supine position. General endotracheal anesthesia was induced. The patient was rotated 90 degrees. Stryker image-guided sinus system was set up and calibrated with an error of 0.9 mm. This was referred to throughout the duration of the case to ensure proper positioning of instrumentation. At this time, the patient was prepped and draped in a sterile fashion.  Then, 5 mL of 1% lidocaine with 1:100,000 epinephrine was injected into the patient's anterior/inferior turbinate, as well as anterior middle turbinate, as well as anterior septum. The zero degree endoscope was brought into the field and placed in the patient's left nasal cavity. This demonstrated erythema and inflammation in the middle meatus. A Freer elevator was used to medialize the middle turbinate and the uncinate process was brought forward. At this time, the frontal sinus outflow tract was evaluated and then it was also dilated with a Acclarent balloon sinuplasty device with good transillumination of the left frontal sinus. After serial dilation of the left frontal sinus outflow tract, the giraffe up-biting forceps was used to pick away at the small bones of the frontal sinus recess and visualization of a widely patent recess was made. At this time, a Diego microdebrider was brought onto the field and the remaining bone of the uncinate process was removed with the [place] and a left-sided maxillary antrostomy was created with combination of Diego microdebrider as well as maxillary ostia seeker. This demonstrated inflammatory tissue and polypoid degeneration of the tissue. This was sent for pathological evaluation. At this time, attention was directed to the patient's left anterior and posterior ethmoid using image-guided sinus suction. The anterior ethmoid bulla was entered in the medial and inferior aspect. The anterior ethmoid cells were sequentially crushed and removed with a straight biter forceps, then the posterior lamella was entered and the posterior ethmoid was entered and the remaining small fragments of bone and soft tissue were removed gently with straight biting forceps. Hemostasis was intermittently obtained with Afrin-soaked pledgets. Attention at this time was directed to  the patient's right sinus. The uncinate process was brought forward, and was removed with the [place] and pediatric backbiting forceps. The right maxillary antrostomy was created with a ball-tipped probe revealing a widely patent maxillary antrostomy, and then attention was directed to the patient's right ethmoid cells. Again, the ethmoid cells were entered in the medial and inferior aspect. There was soft tissue and polypoid degeneration of the maxillary antrostomy as well as anterior ethmoid cells and this was sent for pathologic evaluation. The ethmoid cells were sequentially crushed from a medial and inferior aspect superiorly and laterally and this was passed off the table for permanent pathological evaluation. The posterior lamella was next entered and the posterior ethmoid and cavity was entered, and remaining ethmoid cells were opened with straight biting forceps. At this time, the patient's nasal cavity was copiously irrigated with sterile saline. Meticulous hemostasis was ensured. Arista was placed in the patient's nasal cavities bilaterally, and then xerogel was placed lateral to the middle turbinates bilaterally and then some Arista was placed on the cut edge of the maxillary antrostomies as well. At this time, care of the patient was transferred to anesthesia.    ____________________________ Jerene Bears, MD ccv:TT D: 10/16/2014 12:54:47 ET T: 10/16/2014 14:13:15 ET JOB#: [id number]  cc: Jerene Bears, MD, <Dictator> Jerene Bears MD ELECTRONICALLY SIGNED 10/17/2014 17:51

## 2015-03-08 NOTE — Discharge Summary (Signed)
PATIENT NAME:  [name], KUBLY MR#:  [id number] DATE OF BIRTH:  07-28-1941  DATE OF ADMISSION:  11/26/2011 DATE OF DISCHARGE:  11/28/2011  ADMITTING PHYSICIAN: Dr. Fritzi Mandes DISCHARGING PHYSICIAN:  Dr. Deanne Coffer PRIMARY CARE PHYSICIAN: Dr. Ronette Deter  ADMITTING DIAGNOSIS: Syncopal episode.  DISCHARGE DIAGNOSES: 1. Syncope, probably vasovagal from dehydration and urinary tract infection. 2. Hypertension.  3. Urinary tract infection. 4. Dehydration. 5. Sinus congestion and sinusitis.  6. Essential tremors.  7. Hyperglycemia.  8. Anemia.   CONSULTANTS: Dr. Neoma Laming   TESTS DONE DURING THIS HOSPITALIZATION:  1. Echo Doppler 11/27/2011 showed ejection fraction greater than 55%, left ventricular systolic function normal, left ventricle is grossly normal size.  2. CT scan of the head: No acute intracranial process. Perisinus/sinus disease.  3. Chest x-ray 01/13 showed no acute cardiopulmonary disease. 4. Carotid ultrasound showed no definite evidence of hemodynamically significant stenosis in external carotid arteries. Mild elevated peak systolic velocity in the mid right internal carotid artery, likely artifactual related to vessel tortuosity.   CONSULTANTS:  1. Dr. Neoma Laming.  2. Case management.  3. Physical therapy.  4. Occupational therapy.   HOSPITAL COURSE: Initial History and Physical were done by Dr. Fritzi Mandes. Please refer to her note dated 11/26/2011 for complete details. In brief, this is a 74 year old white female with history of hypertension and essential tremors who presents with syncopal episodes times three while standing in line. She was admitted to the hospitalist service.  1. Syncope times three:  Most likely vasovagal. The patient had supposed v fib rhythm by EMS. She was seen by Dr. Neoma Laming who repeated EKG, which was normal sinus rhythm. She had an echo, which showed no significant abnormalities.  She had a head CT, carotids, TSH, all within  normal limits. This may have been just a manifestation of dehydration from urinary tract infection.  2. Urinary tract infection: The patient was on Levaquin. Cultures showed no growth. She was discharged on oral Levaquin. She was given IV fluids.  3. Dehydration: The patient was given IV fluids.  4. Hypertension: The patient was stable on benazepril. 5. Sinus congestion: The patient had no infiltrate on chest x-ray. However, may have had bronchitis and sinusitis as evidenced by CT scan. She was on Levaquin and Mucinex was added. 6. Essential tremors:  The patient was continued on Primidone.  7. Hyperglycemia: A1c was found to be 6.7.  8. Anemia: This was stable.  9. Deep vein thrombosis prophylaxis was maintained with Lovenox.  10. Debilitation: The patient was evaluated by PT and recommended for Home Health.   She was discharged on 11/27/2010.   VITAL SIGNS: Temperature 98.6, heart rate 76, respirations 18, blood pressure 118/80, sating 96% on room air.    PHYSICAL EXAMINATION: LUNGS: Clear to auscultation.  CARDIOVASCULAR: Regular rate and rhythm. ABDOMEN: Benign.   DISCHARGE MEDICATIONS:  1. Benazepril 20 mg daily.  2. Primidone 25 mg once daily. 3. Multivitamin 1 tablet daily. 4. Aspirin 81 mg daily. 5. Levaquin 500 mg p.o. daily times seven days. 6. Mucinex 600 mg p.o. b.i.d. times seven days, 14 tablets provided.   OXYGEN: None.   DIET: Low sodium.   ACTIVITY: As tolerated.   FOLLOWUP:  The patient should see Dr. Ronette Deter in one week, Dr. Neoma Laming in two to three days. Home Health, home PT was set up.   CODE STATUS: FULL CODE.  Thank you for allowing me to participate in the care of this patient.  TOTAL  TIME SPENT ON DISCHARGE: 50 minutes.   ____________________________ Judeth Horn Royden Purl, MD aaf:bjt D: 11/30/2011 10:58:16 ET T: 11/30/2011 12:54:43 ET JOB#: [id number]  cc: Mike Craze A. Royden Purl, MD, <Dictator> Dionisio David, MD Eduard Clos Gilford Rile, MD Joaquin Bend MD ELECTRONICALLY SIGNED 11/30/2011 15:42

## 2015-03-08 NOTE — H&P (Signed)
PATIENT NAME:  [name], [name] MR#:  [id number] DATE OF BIRTH:  01-May-1941  DATE OF ADMISSION:  11/26/2011  PRIMARY CARE PHYSICIAN: Ronette Deter, MD  CHIEF COMPLAINT: Syncopal episode x3 today.   HISTORY OF PRESENT ILLNESS: [name] is a 74 year old Caucasian female with history of essential tremors and hypertension who presents to the emergency room via EMS with the above-mentioned chief complaint. The patient says she has been having some cough, cold and congestion for the past several weeks. She ran out of her over-the-counter medicine and went to [facility] this morning. She was standing in the line at the pharmacy to get the medicine and started feeling woozy and started seeing some spots in front of her eyes and had a syncopal episode. She did not pass out completely. She got up and continued to remain standing in the line and after a few minutes she had another episode where she did have transient loss of consciousness. She remembered hearing people talking and seen by EMTs who brought her to the emergency room. In route, per the EMT who reported to the ER physician, there is a possibility of some abnormal arrhythmia on her telemetry monitoring. Unfortunately, I do not have the rhythm strip. The patient has been in sinus rhythm and is being admitted for further evaluation and management. Her vitals are normal.   PAST MEDICAL HISTORY:  1. Hypertension.  2. Essential tremors.   MEDICATIONS:  1. Primidone 25 mg twice a day. 2. Multivitamin.  3. Benazepril 20 mg daily.   FAMILY HISTORY: Positive for hypertension and diabetes.   SOCIAL HISTORY: Nonsmoker, nonalcoholic. Lives at home.   REVIEW OF SYSTEMS: CONSTITUTIONAL: No fever, fatigue, or weakness. EYES: No blurred or double vision. ENT: Positive for cold and cough with sinus congestion. RESPIRATORY: No cough, wheeze, or hemoptysis. CARDIOVASCULAR: No chest pain, orthopnea, or edema. Positive for syncope. GI: No nausea, vomiting, diarrhea,  abdominal pain, or hematemesis. GU: No dysuria or hematuria. ENDOCRINE: No polyuria or nocturia. HEMATOLOGY: No anemia or easy bruising. SKIN: No acne or rash. MUSCULOSKELETAL: Positive for arthritis. NEUROLOGIC: No cerebrovascular accident or transient ischemic attack. PSYCH: No anxiety or depression. All other systems are reviewed and negative.   PHYSICAL EXAMINATION:   GENERAL: The patient is awake, alert, and oriented x3, not in acute distress.   VITAL SIGNS: Afebrile, pulse 83, blood pressure 127/53, and saturations are 96% on room air.   HEENT: Atraumatic, normocephalic. Pupils are equal, round, and reactive to light and accommodation. Extraocular movements intact. Oral mucosa is moist.   NECK: Supple. No JVD. No carotid bruit.   LUNGS: Clear to auscultation bilaterally. No rales, rhonchi, respiratory distress, or labored breathing.   HEART: Both heart sounds are normal. Rate and rhythm is regular. PMI is not lateralized. Chest is nontender.   EXTREMITIES: Good pedal pulses. Good femoral pulses. No lower extremity edema.   ABDOMEN: Soft and benign, nontender. No organomegaly. Positive bowel sounds.   NEUROLOGIC: Cranial nerves II through XII grossly intact. No motor or sensory deficits.   PSYCH: The patient is awake, alert, and oriented x3.   EKG: Normal sinus rhythm.   CBC is within normal limits. Basic metabolic panel is within normal limits except glucose of 165. Cardiac enzymes, first set, is negative.     ASSESSMENT: 74 year old [name] with:  1. Syncopal episode x3 today, etiology unclear, could be vasovagal, however cannot rule out any arrhythmia, especially reported by EMT some arrhythmic rhythm noted in route to the ER, no rhythm  strips available. The patient has no cardiac history and has been in sinus rhythm.  2. Hypertension.  3. Sinus congestion.  4. Essential tremors.   PLAN:  1. Admit the patient for overnight observation.  2. IV fluids.  3. Continue home  medications, which are benazepril and primidone.  4. Deep vein thrombosis prophylaxis with Lovenox.  5. The case was discussed with Dr. Neoma Laming who will see the patient in consultation.  6. Cycle cardiac enzymes x3 and check Echo Doppler.   Further work-up according to the patient's clinical course. The hospital admission plan was discussed with the patient. No family members are present. The patient is a FULL CODE.   TIME SPENT: 50 minutes. ____________________________ Mandy Rochester Posey Pronto, MD sap:slb D: 11/26/2011 13:53:35 ET T: 11/26/2011 14:08:36 ET JOB#: [id number]  cc: Ashiyah Pavlak A. Posey Pronto, MD, <Dictator> Eduard Clos. Gilford Rile, MD Ilda Basset MD ELECTRONICALLY SIGNED 11/29/2011 15:24

## 2015-03-08 NOTE — Consult Note (Signed)
PATIENT NAME:  [name], [name] MR#:  [id number] DATE OF BIRTH:  [birth year]  DATE OF CONSULTATION:  11/27/2011  REFERRING PHYSICIAN:   CONSULTING PHYSICIAN:  Dionisio David, MD  HISTORY OF PRESENT ILLNESS: This is a 74 year old white female with a past medical history of essential tremor and hypertension who presented to the emergency room with apparently episodes of syncope. She says that she has been having upper respiratory infection type of symptoms with cough and low grade fever. She thought she had a cold so she went to get medication at her pharmacy where she all of a sudden passed out. She was told she had some arrhythmia, but there was no documentation on telemetry. She was also told that her blood pressure dropped to 80 systolic. At this time, she denies any chest pain, shortness of breath, PND, or orthopnea. She just has some cough. No chest pain.   PAST MEDICAL HISTORY:  1. History of hypertension.  2. Essential tremor.   MEDICATIONS: In the past, has been benazepril 20 mg p.o. daily.   FAMILY HISTORY: Positive for coronary artery disease; father had coronary artery disease.   SOCIAL HISTORY: No history of EtOH abuse or smoking.   PHYSICAL EXAMINATION: She is alert and oriented x3, in no acute distress right now.   VITAL SIGNS: Blood pressure 126/84, respirations 18, pulse 85, temperature 98.1, and saturation 96%.   NECK: No JVD.   LUNGS: Clear.   HEART: Regular rate and rhythm. Normal S1 and S2. No audible murmur.   ABDOMEN: Soft and nontender. Positive bowel sounds.   EXTREMITIES: No pedal edema.   LABS/STUDIES: EKG showed normal sinus rhythm at 70 beats per minute, no acute changes, within normal limits.   Cardiac enzymes x3 are negative.   The monitor does not show any arrhythmias.   ASSESSMENT AND PLAN: The patient had a syncope, most likely secondary to hypotension, may be related to dehydration. She got some IV fluid. She also has an upper respiratory tract  infection. Her urinalysis also was cloudy and has WBCs; she may have a urinary tract infection. I advise treating the urinary tract infection as well as bronchitis, since chest x-ray was negative. As far as cardiac work-up, it is not necessary right now. I advise treating this upper respiratory tract infection and urinary tract infection, and we will follow with her on Tuesday at 2 o'clock  p.m. in the office. Thank you very much for the referral.  ____________________________ Dionisio David, MD sak:slb D: 11/27/2011 10:21:26 ET T: 11/27/2011 10:36:52 ET JOB#: [id number]  cc: Dionisio David, MD, <Dictator> Eduard Clos. Gilford Rile, MD Dionisio David MD ELECTRONICALLY SIGNED 12/13/2011 9:00

## 2015-03-09 LAB — SURGICAL PATHOLOGY

## 2015-04-15 ENCOUNTER — Ambulatory Visit (INDEPENDENT_AMBULATORY_CARE_PROVIDER_SITE_OTHER): Payer: Commercial Managed Care - HMO

## 2015-04-15 VITALS — BP 132/76 | HR 68 | Temp 98.2°F | Resp 14 | Ht 65.0 in

## 2015-04-15 DIAGNOSIS — Z23 Encounter for immunization: Secondary | ICD-10-CM

## 2015-04-15 DIAGNOSIS — Z0000 Encounter for general adult medical examination without abnormal findings: Secondary | ICD-10-CM

## 2015-04-15 DIAGNOSIS — Z780 Asymptomatic menopausal state: Secondary | ICD-10-CM | POA: Diagnosis not present

## 2015-04-15 NOTE — Patient Instructions (Addendum)
[name] , Thank you for taking time to come for your Medicare Wellness Visit. I appreciate your ongoing commitment to your health goals. Please review the following plan we discussed and let me know if I can assist you in the future.   These are the goals we discussed: Goals    . Increase physical activity    . Weight < 200 lb (90.719 kg)       This is a list of the screening recommended for you and due dates:  Health Maintenance  Topic Date Due  . Tetanus Vaccine  04/15/1960  . DEXA scan (bone density measurement)  04/15/2006  . Pneumonia vaccines (2 of 2 - PCV13) 07/25/2014  . Flu Shot  09/27/2015*  . Mammogram  08/21/2016  . Colon Cancer Screening  05/05/2018  . Shingles Vaccine  Addressed  *Topic was postponed. The date shown is not the original due date.    Health Maintenance Adopting a healthy lifestyle and getting preventive care can go a long way to promote health and wellness. Talk with your health care provider about what schedule of regular examinations is right for you. This is a good chance for you to check in with your provider about disease prevention and staying healthy. In between checkups, there are plenty of things you can do on your own. Experts have done a lot of research about which lifestyle changes and preventive measures are most likely to keep you healthy. Ask your health care provider for more information. WEIGHT AND DIET  Eat a healthy diet  Be sure to include plenty of vegetables, fruits, low-fat dairy products, and lean protein.  Do not eat a lot of foods high in solid fats, added sugars, or salt.  Get regular exercise. This is one of the most important things you can do for your health.  Most adults should exercise for at least 150 minutes each week. The exercise should increase your heart rate and make you sweat (moderate-intensity exercise).  Most adults should also do strengthening exercises at least twice a week. This is in addition to the  moderate-intensity exercise.  Maintain a healthy weight  Body mass index (BMI) is a measurement that can be used to identify possible weight problems. It estimates body fat based on height and weight. Your health care provider can help determine your BMI and help you achieve or maintain a healthy weight.  For females 51 years of age and older:   A BMI below 18.5 is considered underweight.  A BMI of 18.5 to 24.9 is normal.  A BMI of 25 to 29.9 is considered overweight.  A BMI of 30 and above is considered obese.  Watch levels of cholesterol and blood lipids  You should start having your blood tested for lipids and cholesterol at 74 years of age, then have this test every 5 years.  You may need to have your cholesterol levels checked more often if:  Your lipid or cholesterol levels are high.  You are older than 74 years of age.  You are at high risk for heart disease.  CANCER SCREENING   Lung Cancer  Lung cancer screening is recommended for adults 41-11 years old who are at high risk for lung cancer because of a history of smoking.  A yearly low-dose CT scan of the lungs is recommended for people who:  Currently smoke.  Have quit within the past 15 years.  Have at least a 30-pack-year history of smoking. A pack year is smoking  an average of one pack of cigarettes a day for 1 year.  Yearly screening should continue until it has been 15 years since you quit.  Yearly screening should stop if you develop a health problem that would prevent you from having lung cancer treatment.  Breast Cancer  Practice breast self-awareness. This means understanding how your breasts normally appear and feel.  It also means doing regular breast self-exams. Let your health care provider know about any changes, no matter how small.  If you are in your 20s or 30s, you should have a clinical breast exam (CBE) by a health care provider every 1-3 years as part of a regular health exam.  If  you are 31 or older, have a CBE every year. Also consider having a breast X-ray (mammogram) every year.  If you have a family history of breast cancer, talk to your health care provider about genetic screening.  If you are at high risk for breast cancer, talk to your health care provider about having an MRI and a mammogram every year.  Breast cancer gene (BRCA) assessment is recommended for women who have family members with BRCA-related cancers. BRCA-related cancers include:  Breast.  Ovarian.  Tubal.  Peritoneal cancers.  Results of the assessment will determine the need for genetic counseling and BRCA1 and BRCA2 testing. Cervical Cancer Routine pelvic examinations to screen for cervical cancer are no longer recommended for nonpregnant women who are considered low risk for cancer of the pelvic organs (ovaries, uterus, and vagina) and who do not have symptoms. A pelvic examination may be necessary if you have symptoms including those associated with pelvic infections. Ask your health care provider if a screening pelvic exam is right for you.   The Pap test is the screening test for cervical cancer for women who are considered at risk.  If you had a hysterectomy for a problem that was not cancer or a condition that could lead to cancer, then you no longer need Pap tests.  If you are older than 65 years, and you have had normal Pap tests for the past 10 years, you no longer need to have Pap tests.  If you have had past treatment for cervical cancer or a condition that could lead to cancer, you need Pap tests and screening for cancer for at least 20 years after your treatment.  If you no longer get a Pap test, assess your risk factors if they change (such as having a new sexual partner). This can affect whether you should start being screened again.  Some women have medical problems that increase their chance of getting cervical cancer. If this is the case for you, your health care  provider may recommend more frequent screening and Pap tests.  The human papillomavirus (HPV) test is another test that may be used for cervical cancer screening. The HPV test looks for the virus that can cause cell changes in the cervix. The cells collected during the Pap test can be tested for HPV.  The HPV test can be used to screen women 62 years of age and older. Getting tested for HPV can extend the interval between normal Pap tests from three to five years.  An HPV test also should be used to screen women of any age who have unclear Pap test results.  After 74 years of age, women should have HPV testing as often as Pap tests.  Colorectal Cancer  This type of cancer can be detected and often prevented.  Routine colorectal cancer screening usually begins at 74 years of age and continues through 74 years of age.  Your health care provider may recommend screening at an earlier age if you have risk factors for colon cancer.  Your health care provider may also recommend using home test kits to check for hidden blood in the stool.  A small camera at the end of a tube can be used to examine your colon directly (sigmoidoscopy or colonoscopy). This is done to check for the earliest forms of colorectal cancer.  Routine screening usually begins at age 24.  Direct examination of the colon should be repeated every 5-10 years through 74 years of age. However, you may need to be screened more often if early forms of precancerous polyps or small growths are found. Skin Cancer  Check your skin from head to toe regularly.  Tell your health care provider about any new moles or changes in moles, especially if there is a change in a mole's shape or color.  Also tell your health care provider if you have a mole that is larger than the size of a pencil eraser.  Always use sunscreen. Apply sunscreen liberally and repeatedly throughout the day.  Protect yourself by wearing long sleeves, pants, a  wide-brimmed hat, and sunglasses whenever you are outside. HEART DISEASE, DIABETES, AND HIGH BLOOD PRESSURE   Have your blood pressure checked at least every 1-2 years. High blood pressure causes heart disease and increases the risk of stroke.  If you are between 47 years and 10 years old, ask your health care provider if you should take aspirin to prevent strokes.  Have regular diabetes screenings. This involves taking a blood sample to check your fasting blood sugar level.  If you are at a normal weight and have a low risk for diabetes, have this test once every three years after 74 years of age.  If you are overweight and have a high risk for diabetes, consider being tested at a younger age or more often. PREVENTING INFECTION  Hepatitis B  If you have a higher risk for hepatitis B, you should be screened for this virus. You are considered at high risk for hepatitis B if:  You were born in a country where hepatitis B is common. Ask your health care provider which countries are considered high risk.  Your parents were born in a high-risk country, and you have not been immunized against hepatitis B (hepatitis B vaccine).  You have HIV or AIDS.  You use needles to inject street drugs.  You live with someone who has hepatitis B.  You have had sex with someone who has hepatitis B.  You get hemodialysis treatment.  You take certain medicines for conditions, including cancer, organ transplantation, and autoimmune conditions. Hepatitis C  Blood testing is recommended for:  Everyone born from [birth year] through 1965.  Anyone with known risk factors for hepatitis C. Sexually transmitted infections (STIs)  You should be screened for sexually transmitted infections (STIs) including gonorrhea and chlamydia if:  You are sexually active and are younger than 74 years of age.  You are older than 74 years of age and your health care provider tells you that you are at risk for this type of  infection.  Your sexual activity has changed since you were last screened and you are at an increased risk for chlamydia or gonorrhea. Ask your health care provider if you are at risk.  If you do not have HIV, but are  at risk, it may be recommended that you take a prescription medicine daily to prevent HIV infection. This is called pre-exposure prophylaxis (PrEP). You are considered at risk if:  You are sexually active and do not regularly use condoms or know the HIV status of your partner(s).  You take drugs by injection.  You are sexually active with a partner who has HIV. Talk with your health care provider about whether you are at high risk of being infected with HIV. If you choose to begin PrEP, you should first be tested for HIV. You should then be tested every 3 months for as long as you are taking PrEP.  PREGNANCY   If you are premenopausal and you may become pregnant, ask your health care provider about preconception counseling.  If you may become pregnant, take 400 to 800 micrograms (mcg) of folic acid every day.  If you want to prevent pregnancy, talk to your health care provider about birth control (contraception). OSTEOPOROSIS AND MENOPAUSE   Osteoporosis is a disease in which the bones lose minerals and strength with aging. This can result in serious bone fractures. Your risk for osteoporosis can be identified using a bone density scan.  If you are 15 years of age or older, or if you are at risk for osteoporosis and fractures, ask your health care provider if you should be screened.  Ask your health care provider whether you should take a calcium or vitamin D supplement to lower your risk for osteoporosis.  Menopause may have certain physical symptoms and risks.  Hormone replacement therapy may reduce some of these symptoms and risks. Talk to your health care provider about whether hormone replacement therapy is right for you.  HOME CARE INSTRUCTIONS   Schedule regular  health, dental, and eye exams.  Stay current with your immunizations.   Do not use any tobacco products including cigarettes, chewing tobacco, or electronic cigarettes.  If you are pregnant, do not drink alcohol.  If you are breastfeeding, limit how much and how often you drink alcohol.  Limit alcohol intake to no more than 1 drink per day for nonpregnant women. One drink equals 12 ounces of beer, 5 ounces of wine, or 1 ounces of hard liquor.  Do not use street drugs.  Do not share needles.  Ask your health care provider for help if you need support or information about quitting drugs.  Tell your health care provider if you often feel depressed.  Tell your health care provider if you have ever been abused or do not feel safe at home. Document Released: 05/16/2011 Document Revised: 03/17/2014 Document Reviewed: 10/02/2013 [facility] Patient Information 2015 [place], [place]. This information is not intended to replace advice given to you by your health care provider. Make sure you discuss any questions you have with your health care provider.

## 2015-04-15 NOTE — Progress Notes (Signed)
Annual Wellness Visit as completed by Health Coach was reviewed in full.  

## 2015-04-15 NOTE — Progress Notes (Signed)
Subjective:   [name] is a 74 y.o. female who presents for Medicare Annual (Subsequent) preventive examination.  Review of Systems:   Cardiac Risk Factors include: advanced age (>49men, >89 women);hypertension     Objective:     Vitals: BP 132/76 mmHg  Pulse 68  Temp(Src) 98.2 F (36.8 C) (Oral)  Resp 14  Ht 5\' 5"  (1.651 m)  Wt   SpO2 97%  Tobacco History  Smoking status  . Never Smoker   Smokeless tobacco  . Never Used     Counseling given: Not Answered   Past Medical History  Diagnosis Date  . Hypertension   . IBS (irritable bowel syndrome)   . Arthritis     bitateral knees and left foot,s/p cortisone injection in past  . Menopause     age 33  . Chronic constipation   . Vitamin D deficiency 06/07/10   Past Surgical History  Procedure Laterality Date  . Bunionectomy    . Vaginal delivery    . Dilation and curettage of uterus  1971  . Foot surgery    . Abdominal hysterectomy     Family History  Problem Relation Age of Onset  . Heart disease Father 14    massive MI  . Diabetes Father   . Diabetes Sister   . Cancer Sister     breast  . Bipolar disorder Sister   . Bipolar disorder Maternal Aunt   . Cancer Maternal Grandfather     colon cancer   History  Sexual Activity  . Sexual Activity: Not on file    Outpatient Encounter Prescriptions as of 04/15/2015  Medication Sig  . docusate sodium (COLACE) 100 MG capsule Take 100 mg by mouth daily.  Marland Kitchen losartan (COZAAR) 50 MG tablet TAKE 1 TABLET EVERY DAY  . metoprolol succinate (TOPROL-XL) 100 MG 24 hr tablet Take 100 mg by mouth daily.   . Multiple Vitamins-Minerals (MULTIVITAMIN WITH MINERALS) tablet Take 1 tablet by mouth daily.  . Polyethylene Glycol 3350 (MIRALAX PO) Take by mouth daily as needed.  . topiramate (TOPAMAX) 50 MG tablet Take 50 mg by mouth 2 (two) times daily.    No facility-administered encounter medications on file as of 04/15/2015.    Activities of Daily Living In your  present state of health, do you have any difficulty performing the following activities: 04/15/2015  Hearing? N  Vision? N  Difficulty concentrating or making decisions? Y  Walking or climbing stairs? N  Dressing or bathing? N  Doing errands, shopping? N  Preparing Food and eating ? N  Using the Toilet? N  In the past six months, have you accidently leaked urine? N  Do you have problems with loss of bowel control? N  Managing your Medications? N  Managing your Finances? N  Housekeeping or managing your Housekeeping? N    Patient Care Team: Jackolyn Confer, MD as PCP - General (Internal Medicine) Vladimir Crofts, MD (Neurology) Noreene Filbert, MD as Referring Physician (Radiation Oncology)    Assessment:     Exercise Activities and Dietary recommendations Current Exercise Habits:: Home exercise routine, Type of exercise: walking, Time (Minutes): 20, Frequency (Times/Week): 4, Weekly Exercise (Minutes/Week): 80, Intensity: Mild  Goals    . Increase physical activity    . Weight < 200 lb (90.719 kg)      Fall Risk Fall Risk  04/15/2015  Falls in the past year? No   Depression Screen PHQ 2/9 Scores 04/15/2015  PHQ -  2 Score 0     Cognitive Testing MMSE - Mini Mental State Exam 04/15/2015  Orientation to time 5  Orientation to Place 5  Registration 3  Attention/ Calculation 5  Recall 3  Language- name 2 objects 2  Language- repeat 1  Language- follow 3 step command 3  Language- read & follow direction 1  Write a sentence 1  Copy design 1  Total score 30    Immunization History  Administered Date(s) Administered  . Pneumococcal Polysaccharide-23 07/25/2013  . Zoster 07/27/2011   Screening Tests Health Maintenance  Topic Date Due  . TETANUS/TDAP  04/15/1960  . DEXA SCAN  04/15/2006  . PNA vac Low Risk Adult (2 of 2 - PCV13) 07/25/2014  . INFLUENZA VACCINE  09/27/2015 (Originally 06/15/2015)  . MAMMOGRAM  08/21/2016  . COLONOSCOPY  05/05/2018  . ZOSTAVAX   Addressed      Plan:    During the course of the visit the patient was educated and counseled about the following appropriate screening and preventive services:   Vaccines to include Pneumoccal, Influenza, Hepatitis B, Td, Zostavax, HCV  Electrocardiogram  Cardiovascular Disease  Colorectal cancer screening  Bone density screening  Diabetes screening  Glaucoma screening  Mammography/PAP  Nutrition counseling   Patient Instructions (the written plan) was given to the patient.   Geni Bers, LPN  06/14/4480

## 2015-04-16 ENCOUNTER — Ambulatory Visit: Payer: Self-pay | Admitting: Internal Medicine

## 2015-05-07 ENCOUNTER — Telehealth: Payer: Self-pay | Admitting: Internal Medicine

## 2015-05-07 NOTE — Telephone Encounter (Signed)
Received mychart msg requesting status on dexa scan appt. Please advise pt/msn

## 2015-05-08 ENCOUNTER — Other Ambulatory Visit: Payer: Self-pay | Admitting: Internal Medicine

## 2015-05-13 ENCOUNTER — Ambulatory Visit
Admission: RE | Admit: 2015-05-13 | Discharge: 2015-05-13 | Disposition: A | Discharge status: Home / Self Care | Payer: Commercial Managed Care - HMO | Source: Ambulatory Visit | Attending: Internal Medicine | Admitting: Internal Medicine

## 2015-05-13 DIAGNOSIS — Z13820 Encounter for screening for osteoporosis: Secondary | ICD-10-CM | POA: Diagnosis present

## 2015-05-13 DIAGNOSIS — M8580 Other specified disorders of bone density and structure, unspecified site: Secondary | ICD-10-CM | POA: Insufficient documentation

## 2015-05-13 DIAGNOSIS — Z780 Asymptomatic menopausal state: Secondary | ICD-10-CM

## 2015-05-19 ENCOUNTER — Encounter: Payer: Self-pay | Admitting: Internal Medicine

## 2015-05-19 ENCOUNTER — Encounter: Payer: Self-pay | Admitting: *Deleted

## 2015-05-19 NOTE — Telephone Encounter (Signed)
Pt already notified.  See phone message.   Thanks    Dr Nicki Reaper

## 2015-05-22 ENCOUNTER — Telehealth: Payer: Self-pay | Admitting: *Deleted

## 2015-05-22 NOTE — Telephone Encounter (Signed)
Called pt, offered pt an appt to discuss concerns.  Pt declined appt, stated that she will call the office if she has any issues

## 2015-05-28 ENCOUNTER — Encounter: Payer: Self-pay | Admitting: Internal Medicine

## 2015-05-28 NOTE — Telephone Encounter (Signed)
Can you see if Mcarthur Rossetti has approved her referral for opthalmology? Thanks

## 2015-05-29 ENCOUNTER — Encounter: Payer: Self-pay | Admitting: Internal Medicine

## 2015-06-19 DIAGNOSIS — H3531 Nonexudative age-related macular degeneration: Secondary | ICD-10-CM | POA: Diagnosis not present

## 2015-08-04 ENCOUNTER — Other Ambulatory Visit: Payer: Self-pay | Admitting: Internal Medicine

## 2015-08-05 ENCOUNTER — Ambulatory Visit: Payer: Commercial Managed Care - HMO | Admitting: Radiation Oncology

## 2015-08-05 DIAGNOSIS — H3531 Nonexudative age-related macular degeneration: Secondary | ICD-10-CM | POA: Diagnosis not present

## 2015-08-05 NOTE — Telephone Encounter (Signed)
May refill for 90 days, then must be seen.

## 2015-08-05 NOTE — Telephone Encounter (Signed)
Last OV 11.13.15 with several cancellations.  Please advise refill

## 2015-08-12 ENCOUNTER — Ambulatory Visit (INDEPENDENT_AMBULATORY_CARE_PROVIDER_SITE_OTHER): Payer: Commercial Managed Care - HMO | Admitting: Internal Medicine

## 2015-08-12 ENCOUNTER — Encounter: Payer: Self-pay | Admitting: Internal Medicine

## 2015-08-12 VITALS — BP 142/78 | HR 54 | Temp 98.2°F | Ht 65.0 in

## 2015-08-12 DIAGNOSIS — I10 Essential (primary) hypertension: Secondary | ICD-10-CM

## 2015-08-12 DIAGNOSIS — H3530 Unspecified macular degeneration: Secondary | ICD-10-CM | POA: Diagnosis not present

## 2015-08-12 LAB — COMPREHENSIVE METABOLIC PANEL
ALT: 12 U/L (ref 0–35)
AST: 16 U/L (ref 0–37)
Albumin: 4 g/dL (ref 3.5–5.2)
Alkaline Phosphatase: 127 U/L — ABNORMAL HIGH (ref 39–117)
BUN: 16 mg/dL (ref 6–23)
CO2: 27 meq/L (ref 19–32)
Calcium: 9.5 mg/dL (ref 8.4–10.5)
Chloride: 108 mEq/L (ref 96–112)
Creatinine, Ser: 0.82 mg/dL (ref 0.40–1.20)
GFR: 72.37 mL/min (ref >60.00)
Glucose, Bld: 103 mg/dL — ABNORMAL HIGH (ref 70–99)
Potassium: 4.3 mEq/L (ref 3.5–5.1)
Sodium: 143 mEq/L (ref 135–145)
Total Bilirubin: 0.7 mg/dL (ref 0.2–1.2)
Total Protein: 6.7 g/dL (ref 6.0–8.3)

## 2015-08-12 LAB — LIPID PANEL
CHOL/HDL RATIO: 3
Cholesterol: 228 mg/dL — ABNORMAL HIGH (ref 0–200)
HDL: 66.9 mg/dL (ref >39.00)
LDL Cholesterol: 135 mg/dL — ABNORMAL HIGH (ref 0–99)
NonHDL: 161.46
Triglycerides: 130 mg/dL (ref 0.0–149.0)
VLDL: 26 mg/dL (ref 0.0–40.0)

## 2015-08-12 NOTE — Progress Notes (Signed)
Pre visit review using our clinic review tool, if applicable. No additional management support is needed unless otherwise documented below in the visit note. 

## 2015-08-12 NOTE — Assessment & Plan Note (Signed)
BP Readings from Last 3 Encounters:  08/12/15 142/78  04/15/15 132/76  09/26/14 118/78   BP generally well controlled. Renal function with labs. Continue current medications.

## 2015-08-12 NOTE — Progress Notes (Signed)
Subjective:    Patient ID: [name], female    DOB: 09-11-1941, 74 y.o.   MRN: [id number]  HPI  74YO female presents for follow up.  Recently diagnosed with macular degeneration. Currently, planning to be monitored every 6 months. No intervention at this time.  Compliant with medications. No CP, HA, palpitations.  No new concerns today.   Wt Readings from Last 3 Encounters:  08/08/11 216 lb 8 oz (98.204 kg)  06/07/10 183 lb (83.008 kg)  06/07/10 183 lb (83.008 kg)   BP Readings from Last 3 Encounters:  08/12/15 142/78  04/15/15 132/76  09/26/14 118/78    Past Medical History  Diagnosis Date  . Hypertension   . IBS (irritable bowel syndrome)   . Arthritis     bitateral knees and left foot,s/p cortisone injection in past  . Menopause     age 89  . Chronic constipation   . Vitamin D deficiency 06/07/10   Family History  Problem Relation Age of Onset  . Heart disease Father 73    massive MI  . Diabetes Father   . Diabetes Sister   . Cancer Sister     breast  . Bipolar disorder Sister   . Bipolar disorder Maternal Aunt   . Cancer Maternal Grandfather     colon cancer   Past Surgical History  Procedure Laterality Date  . Bunionectomy    . Vaginal delivery    . Dilation and curettage of uterus  1971  . Foot surgery    . Abdominal hysterectomy     Social History   Social History  . Marital Status: Divorced    Spouse Name: N/A  . Number of Children: N/A  . Years of Education: N/A   Social History Main Topics  . Smoking status: Never Smoker   . Smokeless tobacco: Never Used  . Alcohol Use: Yes     Comment: socially  . Drug Use: No  . Sexual Activity: Not Asked   Other Topics Concern  . None   Social History Narrative   Lives in [place]. No pets.    Review of Systems  Constitutional: Negative for fever, chills, appetite change, fatigue and unexpected weight change.  Eyes: Negative for photophobia, pain and visual disturbance.    Respiratory: Negative for shortness of breath.   Cardiovascular: Negative for chest pain, palpitations and leg swelling.  Gastrointestinal: Negative for nausea, vomiting, abdominal pain, diarrhea and constipation.  Musculoskeletal: Negative for myalgias and arthralgias.  Skin: Negative for color change and rash.  Hematological: Negative for adenopathy. Does not bruise/bleed easily.  Psychiatric/Behavioral: Negative for sleep disturbance and dysphoric mood. The patient is not nervous/anxious.        Objective:    BP 142/78 mmHg  Pulse 54  Temp(Src) 98.2 F (36.8 C) (Oral)  Ht 5\' 5"  (1.651 m)  Wt   SpO2 96% Physical Exam  Constitutional: She is oriented to person, place, and time. She appears well-developed and well-nourished. No distress.  HENT:  Head: Normocephalic and atraumatic.  Right Ear: External ear normal.  Left Ear: External ear normal.  Nose: Nose normal.  Mouth/Throat: Oropharynx is clear and moist. No oropharyngeal exudate.  Eyes: Conjunctivae are normal. Pupils are equal, round, and reactive to light. Right eye exhibits no discharge. Left eye exhibits no discharge. No scleral icterus.  Neck: Normal range of motion. Neck supple. No tracheal deviation present. No thyromegaly present.  Cardiovascular: Normal rate, regular rhythm, normal heart sounds and intact distal pulses.  Exam reveals no gallop and no friction rub.   No murmur heard. Pulmonary/Chest: Effort normal and breath sounds normal. No respiratory distress. She has no wheezes. She has no rales. She exhibits no tenderness.  Musculoskeletal: Normal range of motion. She exhibits no edema or tenderness.  Lymphadenopathy:    She has no cervical adenopathy.  Neurological: She is alert and oriented to person, place, and time. No cranial nerve deficit. She exhibits normal muscle tone. Coordination normal.  Skin: Skin is warm and dry. No rash noted. She is not diaphoretic. No erythema. No pallor.  Psychiatric: She  has a normal mood and affect. Her behavior is normal. Judgment and thought content normal.          Assessment & Plan:   Problem List Items Addressed This Visit      Unprioritized   Essential hypertension, benign - Primary    BP Readings from Last 3 Encounters:  08/12/15 142/78  04/15/15 132/76  09/26/14 118/78   BP generally well controlled. Renal function with labs. Continue current medications.      Relevant Orders   Comprehensive metabolic panel   Lipid panel   Macular degeneration    Will request records from [facility].          Return in about 6 months (around 02/09/2016) for Wellness Visit.

## 2015-08-12 NOTE — Patient Instructions (Signed)
Labs today.  Follow up in 6 months. 

## 2015-08-12 NOTE — Assessment & Plan Note (Signed)
Will request records from [facility].

## 2015-08-14 ENCOUNTER — Ambulatory Visit: Payer: Commercial Managed Care - HMO | Admitting: Radiation Oncology

## 2015-08-19 ENCOUNTER — Ambulatory Visit
Admission: RE | Admit: 2015-08-19 | Discharge: 2015-08-19 | Disposition: A | Discharge status: Home / Self Care | Payer: Commercial Managed Care - HMO | Source: Ambulatory Visit | Attending: Radiation Oncology | Admitting: Radiation Oncology

## 2015-08-19 ENCOUNTER — Encounter: Payer: Self-pay | Admitting: Radiation Oncology

## 2015-08-19 VITALS — BP 137/72 | HR 54 | Temp 95.2°F | Resp 18 | Wt 223.1 lb

## 2015-08-19 DIAGNOSIS — C541 Malignant neoplasm of endometrium: Secondary | ICD-10-CM

## 2015-08-19 NOTE — Progress Notes (Signed)
Radiation Oncology Follow up Note  Name: [name]   Date:   08/19/2015 MRN:  [id number] DOB: June 17, 1941    This 74 y.o. 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 No lymphovascular invasion was identified. She was staged overall T1 1. She is seen today in routine follow-up is doing well she tends towards constipation which is been her norm. She specifically denies lower urinary tract symptoms vaginal bleeding or discomfort..  COMPLICATIONS OF TREATMENT: none  FOLLOW UP COMPLIANCE: keeps appointments   PHYSICAL EXAM:  BP 137/72 mmHg  Pulse 54  Temp(Src) 95.2 F (35.1 C)  Resp 18  Wt 223 lb 1.7 oz (101.2 kg) A well-developed obese female in NAD. On speculum examination vaginal vault is clear no vaginal mucosal lesions are identified. Bimanual examination shows no evidence of parametrial mass vaginal mucosal nodularity. Rectal exam is unremarkable. Well-developed well-nourished patient in NAD. HEENT reveals PERLA, EOMI, discs not visualized.  Oral cavity is clear. No oral mucosal lesions are identified. Neck is clear without evidence of cervical or supraclavicular adenopathy. Lungs are clear to A&P. Cardiac examination is essentially unremarkable with regular rate and rhythm without murmur rub or thrill. Abdomen is benign with no organomegaly or masses noted. Motor sensory and DTR levels are equal and symmetric in the upper and lower extremities. Cranial nerves II through XII are grossly intact. Proprioception is intact. No peripheral adenopathy or edema is identified. No motor or sensory levels are noted. Crude visual fields are within normal  range.  RADIOLOGY RESULTS: CT scan of the sinuses are reviewed she does have a history of sinusitis which is been treated  PLAN: Present time for a GYN standpoint she is doing well with no evidence of disease. I am please were overall progress. I've asked to see her back in 1 year for follow-up. She knows to call sooner with any concerns.  I would like to take this opportunity for allowing me to participate in the care of your patient.Armstead Peaks., MD

## 2015-12-07 ENCOUNTER — Other Ambulatory Visit: Payer: Self-pay | Admitting: Internal Medicine

## 2015-12-30 ENCOUNTER — Encounter: Payer: Self-pay | Admitting: Internal Medicine

## 2016-01-09 ENCOUNTER — Encounter: Payer: Self-pay | Admitting: Internal Medicine

## 2016-01-14 ENCOUNTER — Encounter: Payer: Self-pay | Admitting: Radiation Oncology

## 2016-01-14 ENCOUNTER — Encounter: Payer: Self-pay | Admitting: Internal Medicine

## 2016-01-27 ENCOUNTER — Inpatient Hospital Stay: Payer: Commercial Managed Care - HMO | Attending: Obstetrics and Gynecology | Admitting: Obstetrics and Gynecology

## 2016-01-27 ENCOUNTER — Ambulatory Visit: Payer: Commercial Managed Care - HMO

## 2016-01-27 VITALS — BP 175/90 | HR 56 | Temp 96.6°F | Resp 18 | Wt 240.5 lb

## 2016-01-27 DIAGNOSIS — I10 Essential (primary) hypertension: Secondary | ICD-10-CM | POA: Insufficient documentation

## 2016-01-27 DIAGNOSIS — M1990 Unspecified osteoarthritis, unspecified site: Secondary | ICD-10-CM | POA: Diagnosis not present

## 2016-01-27 DIAGNOSIS — Z8542 Personal history of malignant neoplasm of other parts of uterus: Secondary | ICD-10-CM | POA: Insufficient documentation

## 2016-01-27 DIAGNOSIS — Z90722 Acquired absence of ovaries, bilateral: Secondary | ICD-10-CM | POA: Insufficient documentation

## 2016-01-27 DIAGNOSIS — K589 Irritable bowel syndrome without diarrhea: Secondary | ICD-10-CM | POA: Insufficient documentation

## 2016-01-27 DIAGNOSIS — Z923 Personal history of irradiation: Secondary | ICD-10-CM

## 2016-01-27 DIAGNOSIS — R251 Tremor, unspecified: Secondary | ICD-10-CM | POA: Diagnosis not present

## 2016-01-27 DIAGNOSIS — C541 Malignant neoplasm of endometrium: Secondary | ICD-10-CM

## 2016-01-27 DIAGNOSIS — Z90710 Acquired absence of both cervix and uterus: Secondary | ICD-10-CM | POA: Diagnosis not present

## 2016-01-27 DIAGNOSIS — E559 Vitamin D deficiency, unspecified: Secondary | ICD-10-CM | POA: Diagnosis not present

## 2016-01-27 DIAGNOSIS — K5909 Other constipation: Secondary | ICD-10-CM | POA: Insufficient documentation

## 2016-01-27 NOTE — Progress Notes (Signed)
  Oncology Nurse Navigator Documentation                                                   Chaperoned with pelvic exam-Follow up with GYN Onc in one year

## 2016-01-27 NOTE — Progress Notes (Signed)
Gynecologic Oncology Consult Visit   Referring Provider: Dr Ronette Deter  Chief Concern: Stage IA endometrial cancer  Subjective:  [name] is a 75 y.o. female who is seen in consultation from Dr. Gilford Rile for endometrial cancer.  [name] presents for surveillance for stage Ia, grade 1 endometrial cancer. She has no new gyn or general medical complaints. Her symptoms are chronic and include abdominal bloating and constipation. She uses stool softeners and intermittently Miralax.   She has a PCP, obtains her mammograms at the breast imaging center. Her family history is remarkable forbreast cancer in her sister, and colon cancer in her mother (late onset) and grandmother. She has not had a colonoscopy for a while.   HPI 11/2013 adenocarcinoma of the endometrium stage Ia, (large tumor), grade 1,            TLHBSO and staging, vaginal brachytherapy   Diagnosis: Part A: RIGHT PELVIC LYMPH NODES: - NEGATIVE FOR MALIGNANCY, SIX LYMPH NODES (0/6). . Part B: LEFT PELVIC LYMPH NODES: - NEGATIVE FOR MALIGNANCY, TWO LYMPH NODES (0/2). . Part C: UTERUS, HYSTERECTOMY: - ENDOMETRIOID ADENOCARCINOMA, FIGO GRADE 1, SEE SUMMARY BELOW. - SUBSEROSAL AND INTRAMURAL LEIOMYOMAS. Marland Kitchen RIGHT AND LEFT OVARIES, OOPHORECTOMY: - SURFACE EPITHELIAL INCLUSION GLANDS. - NEGATIVE FOR MALIGNANCY. Marland Kitchen RIGHT AND LEFT FALLOPIAN TUBES, SALPINGECTOMY: - NEGATIVE FOR MALIGNANCY. . Part D: OMENTUM BIOPSY: - NEGATIVE FOR MALIGNANCY. . NOTE: The uterus is severely distorted by leiomyomas. The tumor shows autolytic changes from delayed fixation, and it is difficult to identify the endomyometrial junction in many areas. No usual-type myometrial invasion is identified; superficial myometrial invasion of the "pushing" type cannot be entirely excluded, but there is no evidence of deep myometrial invasion. Tumor is 7 mm from the serosa at the closest point. Marland Kitchen CANCER CASE SUMMARY: ENDOMETRIUM, HYSTERECTOMY SPECIMEN:  Uterine corpus, cervix, bilateral ovaries and fallopian tubes, and omentum PROCEDURE: Simple hysterectomy, bilateral salpingo-oophorectomy, omental biopsy LYMPH NODE SAMPLING: Pelvic lymph nodes SPECIMEN INTEGRITY: Intact hysterectomy specimen TUMOR SIZE: Greatest dimension: 7 cm HISTOLOGIC TYPE: Endometrioid adenocarcinoma HISTOLOGIC GRADE: FIGO grade 1 MYOMETRIAL INVASION: <50% myometrial invasion INVOLVEMENT OF CERVIX: Not involved EXTENT OF INVOLVEMENT OF OTHER ORGANS: Not involved: ovaries, fallopian tubes, and omentum LYMPH-VASCULAR INVASION: Not identified  Problem List: Patient Active Problem List   Diagnosis Date Noted  . Macular degeneration 08/12/2015  . Preop examination 09/26/2014  . Chronic constipation 05/27/2014  . Bartholin cyst 03/28/2014  . Endometrial cancer ([place]) 12/30/2013  . Essential hypertension, benign 04/17/2013  . Tremor 07/08/2011    Past Medical History: Past Medical History  Diagnosis Date  . Hypertension   . IBS (irritable bowel syndrome)   . Arthritis     bitateral knees and left foot,s/p cortisone injection in past  . Menopause     age 89  . Chronic constipation   . Vitamin D deficiency 06/07/10  . Endometrial cancer [facility])     Past Surgical History: Past Surgical History  Procedure Laterality Date  . Bunionectomy    . Vaginal delivery    . Dilation and curettage of uterus  1971  . Foot surgery    . Abdominal hysterectomy     Family History: Family History  Problem Relation Age of Onset  . Heart disease Father 44    massive MI  . Diabetes Father   . Diabetes Sister   . Cancer Sister     breast  . Bipolar disorder Sister   . Bipolar disorder Maternal Aunt   . Cancer Maternal Grandfather  colon cancer   Social History: Social History   Social History  . Marital Status: Divorced    Spouse Name: N/A  . Number of Children: N/A  . Years of Education: N/A   Occupational History  . Not on file.   Social History  Main Topics  . Smoking status: Never Smoker   . Smokeless tobacco: Never Used  . Alcohol Use: Yes     Comment: socially  . Drug Use: No  . Sexual Activity: Not on file   Other Topics Concern  . Not on file   Social History Narrative   Lives in [place]. No pets.    Allergies: Allergies  Allergen Reactions  . Codeine     Nausea and vomiting    Current Medications: Current Outpatient Prescriptions  Medication Sig Dispense Refill  . docusate sodium (COLACE) 100 MG capsule Take 100 mg by mouth daily.    Marland Kitchen losartan (COZAAR) 50 MG tablet TAKE 1 TABLET EVERY DAY 90 tablet 0  . metoprolol succinate (TOPROL-XL) 100 MG 24 hr tablet Take 100 mg by mouth daily.     . Multiple Vitamins-Minerals (MULTIVITAMIN WITH MINERALS) tablet Take 1 tablet by mouth daily.    . Multiple Vitamins-Minerals (PRESERVISION/LUTEIN PO) Take by mouth.    . Polyethylene Glycol 3350 (MIRALAX PO) Take by mouth daily as needed.    . topiramate (TOPAMAX) 50 MG tablet Take 50 mg by mouth 2 (two) times daily.      No current facility-administered medications for this visit.    Review of Systems   Per interval history  Objective:  Physical Examination:  Pulse 53  Temp(Src) 96.6 F (35.9 C)  Resp 18  Wt 240 lb 8.4 oz (109.1 kg)   ECOG Performance Status: 0 - Asymptomatic  General appearance: alert, cooperative and appears stated age HEENT:PERRLA, neck supple with midline trachea and thyroid without masses Lymph node survey: non-palpable, axillary, inguinal, supraclavicular Cardiovascular: regular rate and rhythm, no murmurs or gallops Respiratory: normal air entry, lungs clear to auscultation Abdomen: soft, non-tender, without masses or organomegaly, no hernias and well healed incision Back: inspection of back is normal Extremities: extremities normal, atraumatic, no cyanosis or edema Skin exam - normal coloration and turgor, no rashes, no suspicious skin lesions noted. Neurological exam reveals  alert, oriented, normal speech, no focal findings or movement disorder noted.  Pelvic: exam chaperoned by nurse;  Vulva: normal appearing vulva with no masses, tenderness or lesions; Vagina: normal vagina; Adnexa: normal adnexa in size, nontender and no masses no masses; Rectal: no masses     Assessment:  [name] is a 75 y.o. female diagnosed with stage IA grade 1 endometrial adenocarcinoma s/p TLH/BSO staging and vaginal brachytherapy.    Plan:   Problem List Items Addressed This Visit      Genitourinary   Endometrial cancer [facility]) - Primary     Mrs. Grove will be re-evaluated in 6 months by Dr. Baruch Gouty and RTC to gynecologic oncology clinic in one year.   I urged her to follow up with her PCP for colon cancer screening and to continue her screening mammograms given her family history of cancer.    Mellody Drown, MD   CC:  Jackolyn Confer, MD [id number] [place], [place] [address] [phone]

## 2016-02-04 DIAGNOSIS — H353131 Nonexudative age-related macular degeneration, bilateral, early dry stage: Secondary | ICD-10-CM | POA: Diagnosis not present

## 2016-02-10 DIAGNOSIS — G250 Essential tremor: Secondary | ICD-10-CM | POA: Diagnosis not present

## 2016-03-14 ENCOUNTER — Other Ambulatory Visit: Payer: Self-pay | Admitting: Internal Medicine

## 2016-05-13 ENCOUNTER — Encounter: Payer: Self-pay | Admitting: Internal Medicine

## 2016-07-12 ENCOUNTER — Encounter: Payer: Self-pay | Admitting: Family

## 2016-07-13 ENCOUNTER — Encounter: Payer: Self-pay | Admitting: Family

## 2016-07-13 DIAGNOSIS — H3530 Unspecified macular degeneration: Secondary | ICD-10-CM

## 2016-07-19 DIAGNOSIS — H353190 Nonexudative age-related macular degeneration, unspecified eye, stage unspecified: Secondary | ICD-10-CM | POA: Diagnosis not present

## 2016-08-08 ENCOUNTER — Encounter: Payer: Self-pay | Admitting: Family

## 2016-08-08 ENCOUNTER — Ambulatory Visit (INDEPENDENT_AMBULATORY_CARE_PROVIDER_SITE_OTHER): Payer: Commercial Managed Care - HMO | Admitting: Family

## 2016-08-08 VITALS — BP 150/70 | HR 66 | Temp 97.9°F

## 2016-08-08 DIAGNOSIS — R0982 Postnasal drip: Secondary | ICD-10-CM | POA: Diagnosis not present

## 2016-08-08 DIAGNOSIS — J209 Acute bronchitis, unspecified: Secondary | ICD-10-CM | POA: Diagnosis not present

## 2016-08-08 MED ORDER — AZITHROMYCIN 250 MG PO TABS: ORAL_TABLET | ORAL | 0 refills | Status: DC

## 2016-08-08 MED ORDER — FLUTICASONE PROPIONATE 50 MCG/ACT NA SUSP: 2.0000 | Freq: Every day | NASAL | 2 refills | Status: DC

## 2016-08-08 NOTE — Progress Notes (Signed)
Pre visit review using our clinic review tool, if applicable. No additional management support is needed unless otherwise documented below in the visit note. 

## 2016-08-08 NOTE — Patient Instructions (Signed)

## 2016-08-08 NOTE — Progress Notes (Signed)
Subjective:    Patient ID: [name], female    DOB: 07/16/1941, 75 y.o.   MRN: [id number]  CC: [name] is a 75 y.o. female who presents today for an acute visit.    HPI: Patient for acute visit with chief complaint of cough for 2 days. Endorses sinus congestion for one week. No wheezing, SOB, fever.Has tried Sudafed , Mucinex which relief. Cough worse at night.   Not a smoker and no h/o lung disease.    HISTORY:  Past Medical History:  Diagnosis Date  . Arthritis    bitateral knees and left foot,s/p cortisone injection in past  . Chronic constipation   . Endometrial cancer ([place])   . Hypertension   . IBS (irritable bowel syndrome)   . Menopause    age 46  . Vitamin D deficiency 06/07/10   Past Surgical History:  Procedure Laterality Date  . ABDOMINAL HYSTERECTOMY    . BUNIONECTOMY    . DILATION AND CURETTAGE OF UTERUS  1971  . FOOT SURGERY    . VAGINAL DELIVERY     Family History  Problem Relation Age of Onset  . Heart disease Father 35    massive MI  . Diabetes Father   . Diabetes Sister   . Cancer Sister     breast  . Bipolar disorder Sister   . Bipolar disorder Maternal Aunt   . Cancer Maternal Grandfather     colon cancer    Allergies: Codeine Current Outpatient Prescriptions on File Prior to Visit  Medication Sig Dispense Refill  . docusate sodium (COLACE) 100 MG capsule Take 100 mg by mouth daily.    Marland Kitchen losartan (COZAAR) 50 MG tablet TAKE 1 TABLET EVERY DAY 90 tablet 3  . metoprolol succinate (TOPROL-XL) 100 MG 24 hr tablet Take 100 mg by mouth daily.     . Multiple Vitamins-Minerals (MULTIVITAMIN WITH MINERALS) tablet Take 1 tablet by mouth daily.    . Multiple Vitamins-Minerals (PRESERVISION/LUTEIN PO) Take by mouth.    . Polyethylene Glycol 3350 (MIRALAX PO) Take by mouth daily as needed.    . topiramate (TOPAMAX) 50 MG tablet Take 50 mg by mouth 2 (two) times daily.      No current facility-administered medications on file prior to visit.       Social History  Substance Use Topics  . Smoking status: Never Smoker  . Smokeless tobacco: Never Used  . Alcohol use Yes     Comment: socially    Review of Systems  Constitutional: Negative for chills and fever.  HENT: Positive for congestion and sinus pressure. Negative for ear pain and sore throat.   Respiratory: Negative for cough, shortness of breath and wheezing.   Cardiovascular: Negative for chest pain and palpitations.  Gastrointestinal: Negative for nausea and vomiting.      Objective:    BP (!) 150/70   Pulse 66   Temp 97.9 F (36.6 C)   SpO2 98%    Physical Exam  Constitutional: She appears well-developed and well-nourished.  HENT:  Head: Normocephalic and atraumatic.  Right Ear: Hearing, tympanic membrane, external ear and ear canal normal. No drainage, swelling or tenderness. No foreign bodies. Tympanic membrane is not erythematous and not bulging. No middle ear effusion. No decreased hearing is noted.  Left Ear: Hearing, tympanic membrane, external ear and ear canal normal. No drainage, swelling or tenderness. No foreign bodies. Tympanic membrane is not erythematous and not bulging.  No middle ear effusion. No decreased  hearing is noted.  Nose: Nose normal. No rhinorrhea. Right sinus exhibits no maxillary sinus tenderness and no frontal sinus tenderness. Left sinus exhibits no maxillary sinus tenderness and no frontal sinus tenderness.  Mouth/Throat: Uvula is midline, oropharynx is clear and moist and mucous membranes are normal. No oropharyngeal exudate, posterior oropharyngeal edema, posterior oropharyngeal erythema or tonsillar abscesses.  Eyes: Conjunctivae are normal.  Cardiovascular: Regular rhythm, normal heart sounds and normal pulses.   Pulmonary/Chest: Effort normal and breath sounds normal. She has no wheezes. She has no rhonchi. She has no rales.  Lymphadenopathy:       Head (right side): No submental, no submandibular, no tonsillar, no  preauricular, no posterior auricular and no occipital adenopathy present.       Head (left side): No submental, no submandibular, no tonsillar, no preauricular, no posterior auricular and no occipital adenopathy present.    She has no cervical adenopathy.  Neurological: She is alert.  Skin: Skin is warm and dry.  Psychiatric: She has a normal mood and affect. Her speech is normal and behavior is normal. Thought content normal.  Vitals reviewed.      Assessment & Plan:   1. Acute bronchitis, unspecified organism Due to duration of symptoms, patient and I agreed on antibiotic therapy. No acute respiratory distress.  - azithromycin (ZITHROMAX) 250 MG tablet; Tale 500 mg PO on day 1, then 250 mg PO q24h x 4 days.  Dispense: 6 tablet; Refill: 0  2. Post-nasal drip Allergic rhinitis.  - fluticasone (FLONASE) 50 MCG/ACT nasal spray; Place 2 sprays into both nostrils daily.  Dispense: 16 g; Refill: 2    I am having Ms. Virts start on azithromycin and fluticasone. I am also having her maintain her multivitamin with minerals, metoprolol succinate, topiramate, docusate sodium, Polyethylene Glycol 3350 (MIRALAX PO), Multiple Vitamins-Minerals (PRESERVISION/LUTEIN PO), and losartan.   Meds ordered this encounter  Medications  . azithromycin (ZITHROMAX) 250 MG tablet    Sig: Tale 500 mg PO on day 1, then 250 mg PO q24h x 4 days.    Dispense:  6 tablet    Refill:  0    Order Specific Question:   Supervising Provider    Answer:   Derrel Nip, TERESA L [[id number]]  . fluticasone (FLONASE) 50 MCG/ACT nasal spray    Sig: Place 2 sprays into both nostrils daily.    Dispense:  16 g    Refill:  2    Order Specific Question:   Supervising Provider    Answer:   Crecencio Mc [[id number]]    Return precautions given.   Risks, benefits, and alternatives of the medications and treatment plan prescribed today were discussed, and patient expressed understanding.   Education regarding symptom management and  diagnosis given to patient on AVS.  Continue to follow with [name], FNP for routine health maintenance.   [name] and I agreed with plan.   [name], FNP

## 2016-08-16 ENCOUNTER — Encounter: Payer: Self-pay | Admitting: Family

## 2016-08-16 ENCOUNTER — Ambulatory Visit (INDEPENDENT_AMBULATORY_CARE_PROVIDER_SITE_OTHER): Payer: Commercial Managed Care - HMO | Admitting: Family

## 2016-08-16 VITALS — BP 132/74 | HR 55 | Temp 97.6°F | Ht 65.0 in

## 2016-08-16 DIAGNOSIS — R251 Tremor, unspecified: Secondary | ICD-10-CM

## 2016-08-16 DIAGNOSIS — M8580 Other specified disorders of bone density and structure, unspecified site: Secondary | ICD-10-CM | POA: Diagnosis not present

## 2016-08-16 DIAGNOSIS — Z0000 Encounter for general adult medical examination without abnormal findings: Secondary | ICD-10-CM | POA: Diagnosis not present

## 2016-08-16 DIAGNOSIS — C541 Malignant neoplasm of endometrium: Secondary | ICD-10-CM | POA: Diagnosis not present

## 2016-08-16 NOTE — Assessment & Plan Note (Signed)
Continues to follow with serial pap smear by GYN. She also has follow up with radiation oncologist. Declined pap today during CPE.

## 2016-08-16 NOTE — Progress Notes (Signed)
Subjective:    Patient ID: [name], female    DOB: 06-Aug-1941, 75 y.o.   MRN: [id number]  CC: [name] is a 75 y.o. female who presents today for physical exam.    HPI: Here for CPE. Feeling well.   Follows neurology for benign essential tremor; stable on topamax, metoprolol. No dizziness, lightheadedness.   HTN- No chest pain, SOB.   Osteopenia- Exercises regularly and eats diet high in calcium.           Colorectal Cancer Screening: UTD , normal per patient. No document to review.  Breast Cancer Screening: Mammogram Due.  Cervical Cancer Screening: h/o hysterectomy. Follow Onc this week for follow up to cervical cancer. She states she will have a pap. S/p radiation after hysterectomy. Continues to follow GYN ONC as well.  Bone Health screening/DEXA for 65+: No increased fracture risk. Defer screening at this time. DEXA 2016. Osteopenia. FRAX 18.6% major, hip 8.8%   Immunizations       Tetanus - Due        Pneumococcal - Done   Labs: Screening labs ordered.  Exercise: Gets regular exercise.  Alcohol use: Seldom.  Smoking/tobacco use: Nonsmoker.  Regular dental exams: UTD Wears seat belt: Yes.  HISTORY:  Past Medical History:  Diagnosis Date  . Arthritis    bitateral knees and left foot,s/p cortisone injection in past  . Chronic constipation   . Endometrial cancer ([place])   . Hypertension   . IBS (irritable bowel syndrome)   . Menopause    age 67  . Vitamin D deficiency 06/07/10    Past Surgical History:  Procedure Laterality Date  . ABDOMINAL HYSTERECTOMY    . BUNIONECTOMY    . DILATION AND CURETTAGE OF UTERUS  1971  . FOOT SURGERY    . VAGINAL DELIVERY     Family History  Problem Relation Age of Onset  . Heart disease Father 92    massive MI  . Diabetes Father   . Diabetes Sister   . Cancer Sister     breast  . Bipolar disorder Sister   . Osteoporosis Sister   . Bipolar disorder Maternal Aunt   . Cancer Maternal Grandfather     colon  cancer  . Osteoporosis Mother       ALLERGIES: Codeine  Current Outpatient Prescriptions on File Prior to Visit  Medication Sig Dispense Refill  . docusate sodium (COLACE) 100 MG capsule Take 100 mg by mouth daily.    . fluticasone (FLONASE) 50 MCG/ACT nasal spray Place 2 sprays into both nostrils daily. 16 g 2  . losartan (COZAAR) 50 MG tablet TAKE 1 TABLET EVERY DAY 90 tablet 3  . metoprolol succinate (TOPROL-XL) 100 MG 24 hr tablet Take 100 mg by mouth daily.     . Multiple Vitamins-Minerals (MULTIVITAMIN WITH MINERALS) tablet Take 1 tablet by mouth daily.    . Multiple Vitamins-Minerals (PRESERVISION/LUTEIN PO) Take by mouth.    . Polyethylene Glycol 3350 (MIRALAX PO) Take by mouth daily as needed.    . topiramate (TOPAMAX) 50 MG tablet Take 50 mg by mouth 2 (two) times daily.      No current facility-administered medications on file prior to visit.     Social History  Substance Use Topics  . Smoking status: Never Smoker  . Smokeless tobacco: Never Used  . Alcohol use Yes     Comment: socially    Review of Systems  Constitutional: Negative for chills, fever and unexpected  weight change.  HENT: Negative for congestion.   Respiratory: Negative for cough.   Cardiovascular: Negative for chest pain, palpitations and leg swelling.  Gastrointestinal: Negative for nausea and vomiting.  Musculoskeletal: Negative for arthralgias and myalgias.  Skin: Negative for rash.  Neurological: Negative for headaches.  Hematological: Negative for adenopathy.  Psychiatric/Behavioral: Negative for confusion.      Objective:    BP 132/74   Pulse (!) 55   Temp 97.6 F (36.4 C) (Oral)   Ht 5\' 5"  (1.651 m)   SpO2 97%   BP Readings from Last 3 Encounters:  08/16/16 132/74  08/08/16 (!) 150/70  01/27/16 (!) 175/90   Wt Readings from Last 3 Encounters:  01/27/16 240 lb 8.4 oz (109.1 kg)  08/19/15 223 lb 1.7 oz (101.2 kg)  08/08/11 216 lb 8 oz (98.2 kg)    Physical Exam    Constitutional: She appears well-developed and well-nourished.  Eyes: Conjunctivae are normal.  Neck: No thyroid mass and no thyromegaly present.  Cardiovascular: Normal rate, regular rhythm, normal heart sounds and normal pulses.   Pulmonary/Chest: Effort normal and breath sounds normal. She has no wheezes. She has no rhonchi. She has no rales. Right breast exhibits no inverted nipple, no mass, no nipple discharge, no skin change and no tenderness. Left breast exhibits no inverted nipple, no mass, no nipple discharge, no skin change and no tenderness. Breasts are symmetrical.  CBE performed.   Lymphadenopathy:       Head (right side): No submental, no submandibular, no tonsillar, no preauricular, no posterior auricular and no occipital adenopathy present.       Head (left side): No submental, no submandibular, no tonsillar, no preauricular, no posterior auricular and no occipital adenopathy present.    She has no cervical adenopathy.       Right cervical: No superficial cervical, no deep cervical and no posterior cervical adenopathy present.      Left cervical: No superficial cervical, no deep cervical and no posterior cervical adenopathy present.    She has no axillary adenopathy.  Neurological: She is alert.  Skin: Skin is warm and dry.  Psychiatric: She has a normal mood and affect. Her speech is normal and behavior is normal. Thought content normal.  Vitals reviewed.      Assessment & Plan:   Problem List Items Addressed This Visit      Musculoskeletal and Integument   Osteopenia    Based on current guidelines, patient would be candidate for biphosphonates as hip % is greater than 3%. Discussed medication treatment with patient and she declined at this time. She would like to treat with exercise, vitamin d and diet rich in calcium and recheck in one year.         Genitourinary   Endometrial cancer ([place])    Continues to follow with serial pap smear by GYN. She also has follow up  with radiation oncologist. Declined pap today during CPE.         Other   Tremor    Stable on current regimen. Follows with neurology.       Routine general medical examination at a health care facility    Patient is up-to-date on colonoscopy. Was unable to review this report however per patient she stated it was normal. She is due for her mammogram and that has been scheduled. We did not perform Pap smear today she follows with radiation oncologist in GYN for this. We reviewed her DEXA scan 2016 at length and  patient politely declines medication treatment at this time. Due for Tdap. Screening labs ordered.        Other Visit Diagnoses    Routine physical examination    -  Primary   Relevant Orders   CBC with Differential/Platelet   Comprehensive metabolic panel   Hemoglobin A1c   Lipid panel   TSH   VITAMIN D 25 Hydroxy (Vit-D Deficiency, Fractures)       I have discontinued Ms. Romagnoli's azithromycin. I am also having her maintain her multivitamin with minerals, metoprolol succinate, topiramate, docusate sodium, Polyethylene Glycol 3350 (MIRALAX PO), Multiple Vitamins-Minerals (PRESERVISION/LUTEIN PO), losartan, and fluticasone.   No orders of the defined types were placed in this encounter.   Return precautions given.   Risks, benefits, and alternatives of the medications and treatment plan prescribed today were discussed, and patient expressed understanding.   Education regarding symptom management and diagnosis given to patient on AVS.   Continue to follow with [name], FNP for routine health maintenance.   [name] and I agreed with plan.   [name], FNP

## 2016-08-16 NOTE — Assessment & Plan Note (Signed)
Patient is up-to-date on colonoscopy. Was unable to review this report however per patient she stated it was normal. She is due for her mammogram and that has been scheduled. We did not perform Pap smear today she follows with radiation oncologist in GYN for this. We reviewed her DEXA scan 2016 at length and patient politely declines medication treatment at this time. Due for Tdap. Screening labs ordered.

## 2016-08-16 NOTE — Patient Instructions (Addendum)
Pleasure meeting you   Lab appointment   Your dexa scan shows osteopenia. Your fracture risk for the next 10 years is 8% for your hip and 18% for major osteoporotic fracture, which meets guidelines for treatment.    We need to follow this and monitor again in 1 more years to see if improved or worsened.  For post menopausal women, guidelines recommend a diet with 1200 mg of Calcium per day. If you are eating calcium rich foods, you do not need a calcium supplement. The body better absorbs the calcium that you eat over supplementation. If you do supplement, I recommend not supplementing the full 1200 mg/ day as this can lead to increased risk of cardiovascular disease. I recommend Calcium Citrate over the counter, and you may take a total of 600 to 800 mg per day in divided doses with meals for best absorption.   For bone health, you need adequate vitamin D, and I recommend you supplement as it is harder to do so with diet alone. I recommend cholecalciferol 800 units daily.  Also, please ensure you are following a diet high in calcium -- research shows better outcomes with dietary sources including kale, yogurt, broccolii, cheese, okra, almonds- to name a few.     Also remember that exercise is a great medicine for maintain and preserve bone health. Advise moderate exercise for 30 minutes , 3 times per week.    Health Maintenance, Female Adopting a healthy lifestyle and getting preventive care can go a long way to promote health and wellness. Talk with your health care provider about what schedule of regular examinations is right for you. This is a good chance for you to check in with your provider about disease prevention and staying healthy. In between checkups, there are plenty of things you can do on your own. Experts have done a lot of research about which lifestyle changes and preventive measures are most likely to keep you healthy. Ask your health care provider for more information. WEIGHT  AND DIET  Eat a healthy diet  Be sure to include plenty of vegetables, fruits, low-fat dairy products, and lean protein.  Do not eat a lot of foods high in solid fats, added sugars, or salt.  Get regular exercise. This is one of the most important things you can do for your health.  Most adults should exercise for at least 150 minutes each week. The exercise should increase your heart rate and make you sweat (moderate-intensity exercise).  Most adults should also do strengthening exercises at least twice a week. This is in addition to the moderate-intensity exercise.  Maintain a healthy weight  Body mass index (BMI) is a measurement that can be used to identify possible weight problems. It estimates body fat based on height and weight. Your health care provider can help determine your BMI and help you achieve or maintain a healthy weight.  For females 56 years of age and older:   A BMI below 18.5 is considered underweight.  A BMI of 18.5 to 24.9 is normal.  A BMI of 25 to 29.9 is considered overweight.  A BMI of 30 and above is considered obese.  Watch levels of cholesterol and blood lipids  You should start having your blood tested for lipids and cholesterol at 75 years of age, then have this test every 5 years.  You may need to have your cholesterol levels checked more often if:  Your lipid or cholesterol levels are high.  You are  older than 75 years of age.  You are at high risk for heart disease.  CANCER SCREENING   Lung Cancer  Lung cancer screening is recommended for adults 14-55 years old who are at high risk for lung cancer because of a history of smoking.  A yearly low-dose CT scan of the lungs is recommended for people who:  Currently smoke.  Have quit within the past 15 years.  Have at least a 30-pack-year history of smoking. A pack year is smoking an average of one pack of cigarettes a day for 1 year.  Yearly screening should continue until it has  been 15 years since you quit.  Yearly screening should stop if you develop a health problem that would prevent you from having lung cancer treatment.  Breast Cancer  Practice breast self-awareness. This means understanding how your breasts normally appear and feel.  It also means doing regular breast self-exams. Let your health care provider know about any changes, no matter how small.  If you are in your 20s or 30s, you should have a clinical breast exam (CBE) by a health care provider every 1-3 years as part of a regular health exam.  If you are 14 or older, have a CBE every year. Also consider having a breast X-ray (mammogram) every year.  If you have a family history of breast cancer, talk to your health care provider about genetic screening.  If you are at high risk for breast cancer, talk to your health care provider about having an MRI and a mammogram every year.  Breast cancer gene (BRCA) assessment is recommended for women who have family members with BRCA-related cancers. BRCA-related cancers include:  Breast.  Ovarian.  Tubal.  Peritoneal cancers.  Results of the assessment will determine the need for genetic counseling and BRCA1 and BRCA2 testing. Cervical Cancer Your health care provider may recommend that you be screened regularly for cancer of the pelvic organs (ovaries, uterus, and vagina). This screening involves a pelvic examination, including checking for microscopic changes to the surface of your cervix (Pap test). You may be encouraged to have this screening done every 3 years, beginning at age 65.  For women ages 79-65, health care providers may recommend pelvic exams and Pap testing every 3 years, or they may recommend the Pap and pelvic exam, combined with testing for human papilloma virus (HPV), every 5 years. Some types of HPV increase your risk of cervical cancer. Testing for HPV may also be done on women of any age with unclear Pap test results.  Other  health care providers may not recommend any screening for nonpregnant women who are considered low risk for pelvic cancer and who do not have symptoms. Ask your health care provider if a screening pelvic exam is right for you.  If you have had past treatment for cervical cancer or a condition that could lead to cancer, you need Pap tests and screening for cancer for at least 20 years after your treatment. If Pap tests have been discontinued, your risk factors (such as having a new sexual partner) need to be reassessed to determine if screening should resume. Some women have medical problems that increase the chance of getting cervical cancer. In these cases, your health care provider may recommend more frequent screening and Pap tests. Colorectal Cancer  This type of cancer can be detected and often prevented.  Routine colorectal cancer screening usually begins at 75 years of age and continues through 75 years of age.  Your health care provider may recommend screening at an earlier age if you have risk factors for colon cancer.  Your health care provider may also recommend using home test kits to check for hidden blood in the stool.  A small camera at the end of a tube can be used to examine your colon directly (sigmoidoscopy or colonoscopy). This is done to check for the earliest forms of colorectal cancer.  Routine screening usually begins at age 58.  Direct examination of the colon should be repeated every 5-10 years through 75 years of age. However, you may need to be screened more often if early forms of precancerous polyps or small growths are found. Skin Cancer  Check your skin from head to toe regularly.  Tell your health care provider about any new moles or changes in moles, especially if there is a change in a mole's shape or color.  Also tell your health care provider if you have a mole that is larger than the size of a pencil eraser.  Always use sunscreen. Apply sunscreen  liberally and repeatedly throughout the day.  Protect yourself by wearing long sleeves, pants, a wide-brimmed hat, and sunglasses whenever you are outside. HEART DISEASE, DIABETES, AND HIGH BLOOD PRESSURE   High blood pressure causes heart disease and increases the risk of stroke. High blood pressure is more likely to develop in:  People who have blood pressure in the high end of the normal range (130-139/85-89 mm Hg).  People who are overweight or obese.  People who are African American.  If you are 57-16 years of age, have your blood pressure checked every 3-5 years. If you are 60 years of age or older, have your blood pressure checked every year. You should have your blood pressure measured twice--once when you are at a hospital or clinic, and once when you are not at a hospital or clinic. Record the average of the two measurements. To check your blood pressure when you are not at a hospital or clinic, you can use:  An automated blood pressure machine at a pharmacy.  A home blood pressure monitor.  If you are between 19 years and 16 years old, ask your health care provider if you should take aspirin to prevent strokes.  Have regular diabetes screenings. This involves taking a blood sample to check your fasting blood sugar level.  If you are at a normal weight and have a low risk for diabetes, have this test once every three years after 75 years of age.  If you are overweight and have a high risk for diabetes, consider being tested at a younger age or more often. PREVENTING INFECTION  Hepatitis B  If you have a higher risk for hepatitis B, you should be screened for this virus. You are considered at high risk for hepatitis B if:  You were born in a country where hepatitis B is common. Ask your health care provider which countries are considered high risk.  Your parents were born in a high-risk country, and you have not been immunized against hepatitis B (hepatitis B  vaccine).  You have HIV or AIDS.  You use needles to inject street drugs.  You live with someone who has hepatitis B.  You have had sex with someone who has hepatitis B.  You get hemodialysis treatment.  You take certain medicines for conditions, including cancer, organ transplantation, and autoimmune conditions. Hepatitis C  Blood testing is recommended for:  Everyone born from [birth year] through 1965.  Anyone with known risk factors for hepatitis C. Sexually transmitted infections (STIs)  You should be screened for sexually transmitted infections (STIs) including gonorrhea and chlamydia if:  You are sexually active and are younger than 75 years of age.  You are older than 75 years of age and your health care provider tells you that you are at risk for this type of infection.  Your sexual activity has changed since you were last screened and you are at an increased risk for chlamydia or gonorrhea. Ask your health care provider if you are at risk.  If you do not have HIV, but are at risk, it may be recommended that you take a prescription medicine daily to prevent HIV infection. This is called pre-exposure prophylaxis (PrEP). You are considered at risk if:  You are sexually active and do not regularly use condoms or know the HIV status of your partner(s).  You take drugs by injection.  You are sexually active with a partner who has HIV. Talk with your health care provider about whether you are at high risk of being infected with HIV. If you choose to begin PrEP, you should first be tested for HIV. You should then be tested every 3 months for as long as you are taking PrEP.  PREGNANCY   If you are premenopausal and you may become pregnant, ask your health care provider about preconception counseling.  If you may become pregnant, take 400 to 800 micrograms (mcg) of folic acid every day.  If you want to prevent pregnancy, talk to your health care provider about birth control  (contraception). OSTEOPOROSIS AND MENOPAUSE   Osteoporosis is a disease in which the bones lose minerals and strength with aging. This can result in serious bone fractures. Your risk for osteoporosis can be identified using a bone density scan.  If you are 65 years of age or older, or if you are at risk for osteoporosis and fractures, ask your health care provider if you should be screened.  Ask your health care provider whether you should take a calcium or vitamin D supplement to lower your risk for osteoporosis.  Menopause may have certain physical symptoms and risks.  Hormone replacement therapy may reduce some of these symptoms and risks. Talk to your health care provider about whether hormone replacement therapy is right for you.  HOME CARE INSTRUCTIONS   Schedule regular health, dental, and eye exams.  Stay current with your immunizations.   Do not use any tobacco products including cigarettes, chewing tobacco, or electronic cigarettes.  If you are pregnant, do not drink alcohol.  If you are breastfeeding, limit how much and how often you drink alcohol.  Limit alcohol intake to no more than 1 drink per day for nonpregnant women. One drink equals 12 ounces of beer, 5 ounces of wine, or 1 ounces of hard liquor.  Do not use street drugs.  Do not share needles.  Ask your health care provider for help if you need support or information about quitting drugs.  Tell your health care provider if you often feel depressed.  Tell your health care provider if you have ever been abused or do not feel safe at home.   This information is not intended to replace advice given to you by your health care provider. Make sure you discuss any questions you have with your health care provider.   Document Released: 05/16/2011 Document Revised: 11/21/2014 Document Reviewed: 10/02/2013 Elsevier Interactive Patient Education 2016 [employer]  

## 2016-08-16 NOTE — Assessment & Plan Note (Addendum)
Based on current guidelines, patient would be candidate for biphosphonates as hip % is greater than 3%. Discussed medication treatment with patient and she declined at this time. She would like to treat with exercise, vitamin d and diet rich in calcium and recheck in one year.

## 2016-08-16 NOTE — Progress Notes (Signed)
Pre visit review using our clinic review tool, if applicable. No additional management support is needed unless otherwise documented below in the visit note. 

## 2016-08-16 NOTE — Assessment & Plan Note (Signed)
Stable on current regimen. Follows with neurology.

## 2016-08-17 ENCOUNTER — Other Ambulatory Visit (INDEPENDENT_AMBULATORY_CARE_PROVIDER_SITE_OTHER): Payer: Commercial Managed Care - HMO

## 2016-08-17 ENCOUNTER — Ambulatory Visit (INDEPENDENT_AMBULATORY_CARE_PROVIDER_SITE_OTHER): Payer: Commercial Managed Care - HMO

## 2016-08-17 ENCOUNTER — Encounter: Payer: Self-pay | Admitting: Family

## 2016-08-17 DIAGNOSIS — Z23 Encounter for immunization: Secondary | ICD-10-CM | POA: Diagnosis not present

## 2016-08-17 DIAGNOSIS — Z0000 Encounter for general adult medical examination without abnormal findings: Secondary | ICD-10-CM | POA: Diagnosis not present

## 2016-08-17 LAB — CBC WITH DIFFERENTIAL/PLATELET
BASOS ABS: 0 10*3/uL (ref 0.0–0.1)
Basophils Relative: 0.3 % (ref 0.0–3.0)
Eosinophils Absolute: 0.2 10*3/uL (ref 0.0–0.7)
Eosinophils Relative: 3.5 % (ref 0.0–5.0)
HCT: 38.7 % (ref 36.0–46.0)
Hemoglobin: 13.1 g/dL (ref 12.0–15.0)
LYMPHS ABS: 2.1 10*3/uL (ref 0.7–4.0)
LYMPHS PCT: 33.4 % (ref 12.0–46.0)
MCHC: 33.8 g/dL (ref 30.0–36.0)
MCV: 87.3 fl (ref 78.0–100.0)
MONOS PCT: 6.1 % (ref 3.0–12.0)
Monocytes Absolute: 0.4 10*3/uL (ref 0.1–1.0)
NEUTROS PCT: 56.7 % (ref 43.0–77.0)
Neutro Abs: 3.6 10*3/uL (ref 1.4–7.7)
Platelets: 276 10*3/uL (ref 150.0–400.0)
RBC: 4.43 Mil/uL (ref 3.87–5.11)
RDW: 14.9 % (ref 11.5–15.5)
WBC: 6.3 10*3/uL (ref 4.0–10.5)

## 2016-08-17 LAB — VITAMIN D 25 HYDROXY (VIT D DEFICIENCY, FRACTURES): VITD: 14.51 ng/mL — ABNORMAL LOW (ref 30.00–100.00)

## 2016-08-17 LAB — LIPID PANEL
CHOL/HDL RATIO: 4
Cholesterol: 240 mg/dL — ABNORMAL HIGH (ref 0–200)
HDL: 64.6 mg/dL (ref >39.00)
LDL Cholesterol: 145 mg/dL — ABNORMAL HIGH (ref 0–99)
NONHDL: 175.09
Triglycerides: 148 mg/dL (ref 0.0–149.0)
VLDL: 29.6 mg/dL (ref 0.0–40.0)

## 2016-08-17 LAB — COMPREHENSIVE METABOLIC PANEL
ALK PHOS: 106 U/L (ref 39–117)
ALT: 22 U/L (ref 0–35)
AST: 25 U/L (ref 0–37)
Albumin: 3.8 g/dL (ref 3.5–5.2)
BILIRUBIN TOTAL: 0.5 mg/dL (ref 0.2–1.2)
BUN: 12 mg/dL (ref 6–23)
CO2: 27 mEq/L (ref 19–32)
CREATININE: 0.88 mg/dL (ref 0.40–1.20)
Calcium: 9.2 mg/dL (ref 8.4–10.5)
Chloride: 107 mEq/L (ref 96–112)
GFR: 66.52 mL/min (ref >60.00)
GLUCOSE: 111 mg/dL — AB (ref 70–99)
Potassium: 4.1 mEq/L (ref 3.5–5.1)
SODIUM: 143 meq/L (ref 135–145)
TOTAL PROTEIN: 7.4 g/dL (ref 6.0–8.3)

## 2016-08-17 LAB — HEMOGLOBIN A1C: Hgb A1c MFr Bld: 6 % (ref 4.6–6.5)

## 2016-08-17 LAB — TSH: TSH: 2.16 u[IU]/mL (ref 0.35–4.50)

## 2016-08-17 NOTE — Progress Notes (Signed)
Patient came in for tdap injection, was ordered at prior appt and not given.  See last OV for details.  Received in Right deltoid. Patient tolerated well.

## 2016-08-18 ENCOUNTER — Ambulatory Visit
Admission: RE | Admit: 2016-08-18 | Discharge: 2016-08-18 | Disposition: A | Discharge status: Home / Self Care | Payer: Commercial Managed Care - HMO | Source: Ambulatory Visit | Attending: Radiation Oncology | Admitting: Radiation Oncology

## 2016-08-18 ENCOUNTER — Encounter: Payer: Self-pay | Admitting: Radiation Oncology

## 2016-08-18 ENCOUNTER — Other Ambulatory Visit: Payer: Self-pay | Admitting: Family

## 2016-08-18 ENCOUNTER — Encounter: Payer: Self-pay | Admitting: Family

## 2016-08-18 VITALS — BP 122/69 | HR 55 | Temp 95.8°F | Wt 231.8 lb

## 2016-08-18 DIAGNOSIS — E785 Hyperlipidemia, unspecified: Secondary | ICD-10-CM

## 2016-08-18 DIAGNOSIS — Z90710 Acquired absence of both cervix and uterus: Secondary | ICD-10-CM | POA: Insufficient documentation

## 2016-08-18 DIAGNOSIS — Z90722 Acquired absence of ovaries, bilateral: Secondary | ICD-10-CM | POA: Diagnosis not present

## 2016-08-18 DIAGNOSIS — Z923 Personal history of irradiation: Secondary | ICD-10-CM | POA: Diagnosis not present

## 2016-08-18 DIAGNOSIS — Z8542 Personal history of malignant neoplasm of other parts of uterus: Secondary | ICD-10-CM | POA: Diagnosis not present

## 2016-08-18 DIAGNOSIS — Z1211 Encounter for screening for malignant neoplasm of colon: Secondary | ICD-10-CM

## 2016-08-18 DIAGNOSIS — C541 Malignant neoplasm of endometrium: Secondary | ICD-10-CM

## 2016-08-18 MED ORDER — SIMVASTATIN 20 MG PO TABS: 20.0000 mg | ORAL_TABLET | Freq: Every evening | ORAL | 3 refills | Status: DC

## 2016-08-18 NOTE — Progress Notes (Signed)
Radiation Oncology Follow up Note  Name: [name]   Date:   08/18/2016 MRN:  [id number] DOB: 1941-02-09    This 75 y.o. female presents to the clinic today for 2 follow-up for endometrial carcinoma status post vaginal brachytherapy.  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 She is seen today in routine follow up is doing well specifically denies diarrhea dysuria or any other GI/GU complaints. She has no vaginal discharge..  COMPLICATIONS OF TREATMENT: none  FOLLOW UP COMPLIANCE: keeps appointments   PHYSICAL EXAM:  BP 122/69   Pulse (!) 55   Temp (!) 95.8 F (35.4 C)   Wt 231 lb 13 oz (105.2 kg)   BMI 38.58 kg/m  On speculum examination vaginal vault is clear. Bimanual examination shows no evidence of mass or nodularity in the vaginal vault or rectum. Parametrial region is clear. Well-developed well-nourished patient in NAD. HEENT reveals PERLA, EOMI, discs not visualized.  Oral cavity is clear. No oral mucosal lesions are identified. Neck is clear without evidence of cervical or supraclavicular adenopathy. Lungs are clear to A&P. Cardiac examination is essentially unremarkable with regular rate and rhythm without murmur rub or thrill. Abdomen is benign with no organomegaly or masses noted. Motor sensory and DTR levels are equal and symmetric in the upper and lower extremities. Cranial nerves II through XII are grossly intact. Proprioception is intact. No peripheral adenopathy or edema is identified. No motor or sensory levels are noted. Crude visual fields are within normal range.  RADIOLOGY RESULTS: No current films for review  PLAN: Present time she continues to do well with no evidence of disease. I'm please were overall progress. I've asked to see her back in 1 year for follow-up. She continues  close follow-up care with GYN oncology. Patient knows to call with any concerns.  I would like to take this opportunity to thank you for allowing me to participate in the care of your patient.Armstead Peaks., MD

## 2016-08-18 NOTE — Progress Notes (Signed)
Patient will be scheduling Mammogram at a later date. Tdap was completed [id number]

## 2016-08-18 NOTE — Telephone Encounter (Signed)
Please enter the referral and I will send it for the GI office to call her to schedule.

## 2016-08-21 ENCOUNTER — Other Ambulatory Visit: Payer: Self-pay | Admitting: Family

## 2016-08-21 DIAGNOSIS — Z1239 Encounter for other screening for malignant neoplasm of breast: Secondary | ICD-10-CM

## 2016-08-21 NOTE — Progress Notes (Addendum)
Fransisco Beau,  Is patient's mammo scheduled? She My chart messaged me about it.   Thanks!  Update: per [employer], patient will schedule.

## 2016-08-22 ENCOUNTER — Encounter: Payer: Self-pay | Admitting: Family

## 2016-08-23 ENCOUNTER — Encounter: Payer: Self-pay | Admitting: Family

## 2016-08-25 ENCOUNTER — Encounter: Payer: Self-pay | Admitting: Family

## 2016-08-28 ENCOUNTER — Encounter: Payer: Self-pay | Admitting: Family

## 2016-08-30 ENCOUNTER — Other Ambulatory Visit: Payer: Self-pay | Admitting: Family

## 2016-08-30 ENCOUNTER — Encounter: Payer: Self-pay | Admitting: Family

## 2016-08-30 ENCOUNTER — Ambulatory Visit
Admission: RE | Admit: 2016-08-30 | Discharge: 2016-08-30 | Disposition: A | Discharge status: Home / Self Care | Payer: Commercial Managed Care - HMO | Source: Ambulatory Visit | Attending: Family | Admitting: Family

## 2016-08-30 DIAGNOSIS — Z1239 Encounter for other screening for malignant neoplasm of breast: Secondary | ICD-10-CM

## 2016-08-30 DIAGNOSIS — Z1231 Encounter for screening mammogram for malignant neoplasm of breast: Secondary | ICD-10-CM | POA: Diagnosis not present

## 2016-11-14 DIAGNOSIS — C189 Malignant neoplasm of colon, unspecified: Secondary | ICD-10-CM

## 2016-11-14 HISTORY — DX: Malignant neoplasm of colon, unspecified: C18.9

## 2016-11-15 ENCOUNTER — Ambulatory Visit (INDEPENDENT_AMBULATORY_CARE_PROVIDER_SITE_OTHER): Payer: Commercial Managed Care - HMO | Admitting: Family

## 2016-11-15 ENCOUNTER — Ambulatory Visit: Payer: Commercial Managed Care - HMO

## 2016-11-15 ENCOUNTER — Encounter: Payer: Self-pay | Admitting: Family

## 2016-11-15 VITALS — BP 118/78 | HR 54 | Temp 97.4°F | Wt 232.0 lb

## 2016-11-15 DIAGNOSIS — H109 Unspecified conjunctivitis: Secondary | ICD-10-CM | POA: Diagnosis not present

## 2016-11-15 DIAGNOSIS — Z0000 Encounter for general adult medical examination without abnormal findings: Secondary | ICD-10-CM | POA: Diagnosis not present

## 2016-11-15 MED ORDER — CIPROFLOXACIN HCL 0.3 % OP SOLN: OPHTHALMIC | 0 refills | Status: DC

## 2016-11-15 NOTE — Addendum Note (Signed)
Addended by: Dia Crawford on: 11/15/2016 01:21 PM   Modules accepted: Miquel Dunn

## 2016-11-15 NOTE — Progress Notes (Signed)
Subjective:    Patient ID: [name], female    DOB: June 30, 1941, 76 y.o.   MRN: [id number]  CC: [name] is a 76 y.o. female who presents today for an acute visit.    HPI: CC: cough, clear congestion x  One week, improved. No wheezing, SOB. Tried allergy eye drops, cough drops, netti pot. Endorses post nasal drip. Sore throat, since resolved. No fever, chills, sinus pressure, ear pain.   Purulent drainaged from both eyes, 'glued shut' for 4 days. More discharge in left. No vision changes, eye pain, photophobia.        HISTORY:  Past Medical History:  Diagnosis Date  . Arthritis    bitateral knees and left foot,s/p cortisone injection in past  . Chronic constipation   . Endometrial cancer ([place])   . Hypertension   . IBS (irritable bowel syndrome)   . Menopause    age 85  . Vitamin D deficiency 06/07/10   Past Surgical History:  Procedure Laterality Date  . ABDOMINAL HYSTERECTOMY    . BUNIONECTOMY    . DILATION AND CURETTAGE OF UTERUS  1971  . FOOT SURGERY    . VAGINAL DELIVERY     Family History  Problem Relation Age of Onset  . Heart disease Father 39    massive MI  . Diabetes Father   . Diabetes Sister   . Cancer Sister     breast  . Bipolar disorder Sister   . Osteoporosis Sister   . Breast cancer Sister 3  . Bipolar disorder Maternal Aunt   . Cancer Maternal Grandfather     colon cancer  . Osteoporosis Mother     Allergies: Codeine Current Outpatient Prescriptions on File Prior to Visit  Medication Sig Dispense Refill  . docusate sodium (COLACE) 100 MG capsule Take 100 mg by mouth daily.    . fluticasone (FLONASE) 50 MCG/ACT nasal spray Place 2 sprays into both nostrils daily. 16 g 2  . losartan (COZAAR) 50 MG tablet TAKE 1 TABLET EVERY DAY 90 tablet 3  . metoprolol succinate (TOPROL-XL) 100 MG 24 hr tablet Take 100 mg by mouth daily.     . Multiple Vitamins-Minerals (MULTIVITAMIN WITH MINERALS) tablet Take 1 tablet by mouth daily.    .  Multiple Vitamins-Minerals (PRESERVISION/LUTEIN PO) Take by mouth.    . Polyethylene Glycol 3350 (MIRALAX PO) Take by mouth daily as needed.    . simvastatin (ZOCOR) 20 MG tablet Take 1 tablet (20 mg total) by mouth every evening. 90 tablet 3  . topiramate (TOPAMAX) 50 MG tablet Take 50 mg by mouth 2 (two) times daily.      No current facility-administered medications on file prior to visit.     Social History  Substance Use Topics  . Smoking status: Never Smoker  . Smokeless tobacco: Never Used  . Alcohol use Yes     Comment: socially/rarely    Review of Systems  Constitutional: Negative for chills and fever.  HENT: Positive for congestion.   Eyes: Positive for discharge. Negative for photophobia, pain, redness, itching and visual disturbance.  Respiratory: Positive for cough. Negative for shortness of breath and wheezing.   Cardiovascular: Negative for chest pain and palpitations.  Gastrointestinal: Negative for nausea and vomiting.      Objective:    BP 118/78   Pulse (!) 54   Temp 97.4 F (36.3 C) (Oral)   Wt 232 lb (105.2 kg)   SpO2 98%   BMI 38.61 kg/m  Physical Exam  Constitutional: She appears well-developed and well-nourished.  HENT:  Head: Normocephalic and atraumatic.  Right Ear: Hearing, tympanic membrane, external ear and ear canal normal. No drainage, swelling or tenderness. No foreign bodies. Tympanic membrane is not erythematous and not bulging. No middle ear effusion. No decreased hearing is noted.  Left Ear: Hearing, tympanic membrane, external ear and ear canal normal. No drainage, swelling or tenderness. No foreign bodies. Tympanic membrane is not erythematous and not bulging.  No middle ear effusion. No decreased hearing is noted.  Nose: Rhinorrhea present. Right sinus exhibits no maxillary sinus tenderness and no frontal sinus tenderness. Left sinus exhibits no maxillary sinus tenderness and no frontal sinus tenderness.  Mouth/Throat: Uvula is  midline, oropharynx is clear and moist and mucous membranes are normal. No oropharyngeal exudate, posterior oropharyngeal edema, posterior oropharyngeal erythema or tonsillar abscesses.  Eyes: Conjunctivae and EOM are normal. Pupils are equal, round, and reactive to light. Lids are everted and swept, no foreign bodies found. Right eye exhibits discharge. Right eye exhibits no hordeolum. No foreign body present in the right eye. Left eye exhibits discharge. Left eye exhibits no hordeolum. No foreign body present in the left eye. Right conjunctiva is not injected. Right conjunctiva has no hemorrhage. Left conjunctiva is not injected. Left conjunctiva has no hemorrhage. No scleral icterus.  No external eye lesions. Surrounding skin intact.   Bilateral eyes:   Diffuse injection of the conjunctiva. No white spots, opacity, or foreign body appreciated. No collection of blood or pus in the anterior chamber. No ciliary flush surrounding iris.   No photophobia or eye pain appreciated during exam.   Cardiovascular: Regular rhythm, normal heart sounds and normal pulses.   Pulmonary/Chest: Effort normal and breath sounds normal. She has no wheezes. She has no rhonchi. She has no rales.  Lymphadenopathy:       Head (right side): No submental, no submandibular, no tonsillar, no preauricular, no posterior auricular and no occipital adenopathy present.       Head (left side): No submental, no submandibular, no tonsillar, no preauricular, no posterior auricular and no occipital adenopathy present.    She has no cervical adenopathy.  Neurological: She is alert.  Skin: Skin is warm and dry.  Psychiatric: She has a normal mood and affect. Her speech is normal and behavior is normal. Thought content normal.  Vitals reviewed.      Assessment & Plan:   1. Bacterial conjunctivitis No eye pain, photophobia. Symptoms consistent with bacterial conjunctivitis. we jointly decided that since cough is improving, we will  treat conservatively. Patient will let me know cough does not continue to improve.   - ciprofloxacin (CILOXAN) 0.3 % ophthalmic solution; 1-2 gtt in eye(s) q2h while awake x 2 days, then q4h x 5 days.  Dispense: 5 mL; Refill: 0    I am having Ms. Raven start on ciprofloxacin. I am also having her maintain her multivitamin with minerals, metoprolol succinate, topiramate, docusate sodium, Polyethylene Glycol 3350 (MIRALAX PO), Multiple Vitamins-Minerals (PRESERVISION/LUTEIN PO), losartan, fluticasone, and simvastatin.   Meds ordered this encounter  Medications  . ciprofloxacin (CILOXAN) 0.3 % ophthalmic solution    Sig: 1-2 gtt in eye(s) q2h while awake x 2 days, then q4h x 5 days.    Dispense:  5 mL    Refill:  0    Order Specific Question:   Supervising Provider    Answer:   Crecencio Mc [[id number]]    Return precautions given.   Risks, benefits,  and alternatives of the medications and treatment plan prescribed today were discussed, and patient expressed understanding.   Education regarding symptom management and diagnosis given to patient on AVS.  Continue to follow with [name], FNP for routine health maintenance.   [name] and I agreed with plan.   [name], FNP

## 2016-11-15 NOTE — Patient Instructions (Addendum)
Stop allergy eye drops Start antibiotic eye drops Start back on flonase  If there is no improvement in your symptoms, or if there is any worsening of symptoms, or if you have any additional concerns, please return for re-evaluation; or, if we are closed, consider going to the Emergency Room for evaluation if symptoms urgent.   Bacterial Conjunctivitis Bacterial conjunctivitis is an infection of the clear membrane that covers the white part of your eye and the inner surface of your eyelid (conjunctiva). When the blood vessels in your conjunctiva become inflamed, your eye becomes red or pink, and it will probably feel itchy. Bacterial conjunctivitis spreads very easily from person to person (is contagious). It also spreads easily from one eye to the other eye. What are the causes? This condition is caused by several common bacteria. You may get the infection if you come into close contact with another person who is infected. You may also come into contact with items that are contaminated with the bacteria, such as a face towel, contact lens solution, or eye makeup. What increases the risk? This condition is more likely to develop in people who:  Are exposed to other people who have the infection.  Wear contact lenses.  Have a sinus infection.  Have had a recent eye injury or surgery.  Have a weak body defense system (immune system).  Have a medical condition that causes dry eyes. What are the signs or symptoms? Symptoms of this condition include:  Eye redness.  Tearing or watery eyes.  Itchy eyes.  Burning feeling in your eyes.  Thick, yellowish discharge from an eye. This may turn into a crust on the eyelid overnight and cause your eyelids to stick together.  Swollen eyelids.  Blurred vision. How is this diagnosed? Your health care provider can diagnose this condition based on your symptoms and medical history. Your health care provider may also take a sample of discharge  from your eye to find the cause of your infection. This is rarely done. How is this treated? Treatment for this condition includes:  Antibiotic eye drops or ointment to clear the infection more quickly and prevent the spread of infection to others.  Oral antibiotic medicines to treat infections that do not respond to drops or ointments, or last longer than 10 days.  Cool, wet cloths (cool compresses) placed on the eyes.  Artificial tears applied 2-6 times a day. Follow these instructions at home: Medicines  Take or apply your antibiotic medicine as told by your health care provider. Do not stop taking or applying the antibiotic even if you start to feel better.  Take or apply over-the-counter and prescription medicines only as told by your health care provider.  Be very careful to avoid touching the edge of your eyelid with the eye drop bottle or the ointment tube when you apply medicines to the affected eye. This will keep you from spreading the infection to your other eye or to other people. Managing discomfort  Gently wipe away any drainage from your eye with a warm, wet washcloth or a cotton ball.  Apply a cool, clean washcloth to your eye for 10-20 minutes, 3-4 times a day. General instructions  Do not wear contact lenses until the inflammation is gone and your health care provider says it is safe to wear them again. Ask your health care provider how to sterilize or replace your contact lenses before you use them again. Wear glasses until you can resume wearing contacts.  Avoid wearing  eye makeup until the inflammation is gone. Throw away any old eye cosmetics that may be contaminated.  Change or wash your pillowcase every day.  Do not share towels or washcloths. This may spread the infection.  Wash your hands often with soap and water. Use paper towels to dry your hands.  Avoid touching or rubbing your eyes.  Do not drive or use heavy machinery if your vision is  blurred. Contact a health care provider if:  You have a fever.  Your symptoms do not get better after 10 days. Get help right away if:  You have a fever and your symptoms suddenly get worse.  You have severe pain when you move your eye.  You have facial pain, redness, or swelling.  You have sudden loss of vision. This information is not intended to replace advice given to you by your health care provider. Make sure you discuss any questions you have with your health care provider. Document Released: 10/31/2005 Document Revised: 03/10/2016 Document Reviewed: 08/13/2015 Elsevier Interactive Patient Education  2017 [place].   Ms. Wiebenga , Thank you for taking time to come for your Medicare Wellness Visit. I appreciate your ongoing commitment to your health goals. Please review the following plan we discussed and let me know if I can assist you in the future.   These are the goals we discussed: Goals    . Increase physical activity    . Weight < 200 lb (90.719 kg)       This is a list of the screening recommended for you and due dates:  Health Maintenance  Topic Date Due  . Flu Shot  11/15/2017*  . Colon Cancer Screening  05/05/2018  . Tetanus Vaccine  08/17/2026  . DEXA scan (bone density measurement)  Completed  . Shingles Vaccine  Addressed  . Pneumonia vaccines  Completed  *Topic was postponed. The date shown is not the original due date.      Fall Prevention in the Home Introduction Falls can cause injuries. They can happen to people of all ages. There are many things you can do to make your home safe and to help prevent falls. What can I do on the outside of my home?  Regularly fix the edges of walkways and driveways and fix any cracks.  Remove anything that might make you trip as you walk through a door, such as a raised step or threshold.  Trim any bushes or trees on the path to your home.  Use bright outdoor lighting.  Clear any walking paths of  anything that might make someone trip, such as rocks or tools.  Regularly check to see if handrails are loose or broken. Make sure that both sides of any steps have handrails.  Any raised decks and porches should have guardrails on the edges.  Have any leaves, snow, or ice cleared regularly.  Use sand or salt on walking paths during winter.  Clean up any spills in your garage right away. This includes oil or grease spills. What can I do in the bathroom?  Use night lights.  Install grab bars by the toilet and in the tub and shower. Do not use towel bars as grab bars.  Use non-skid mats or decals in the tub or shower.  If you need to sit down in the shower, use a plastic, non-slip stool.  Keep the floor dry. Clean up any water that spills on the floor as soon as it happens.  Remove soap buildup  in the tub or shower regularly.  Attach bath mats securely with double-sided non-slip rug tape.  Do not have throw rugs and other things on the floor that can make you trip. What can I do in the bedroom?  Use night lights.  Make sure that you have a light by your bed that is easy to reach.  Do not use any sheets or blankets that are too big for your bed. They should not hang down onto the floor.  Have a firm chair that has side arms. You can use this for support while you get dressed.  Do not have throw rugs and other things on the floor that can make you trip. What can I do in the kitchen?  Clean up any spills right away.  Avoid walking on wet floors.  Keep items that you use a lot in easy-to-reach places.  If you need to reach something above you, use a strong step stool that has a grab bar.  Keep electrical cords out of the way.  Do not use floor polish or wax that makes floors slippery. If you must use wax, use non-skid floor wax.  Do not have throw rugs and other things on the floor that can make you trip. What can I do with my stairs?  Do not leave any items on the  stairs.  Make sure that there are handrails on both sides of the stairs and use them. Fix handrails that are broken or loose. Make sure that handrails are as long as the stairways.  Check any carpeting to make sure that it is firmly attached to the stairs. Fix any carpet that is loose or worn.  Avoid having throw rugs at the top or bottom of the stairs. If you do have throw rugs, attach them to the floor with carpet tape.  Make sure that you have a light switch at the top of the stairs and the bottom of the stairs. If you do not have them, ask someone to add them for you. What else can I do to help prevent falls?  Wear shoes that:  Do not have high heels.  Have rubber bottoms.  Are comfortable and fit you well.  Are closed at the toe. Do not wear sandals.  If you use a stepladder:  Make sure that it is fully opened. Do not climb a closed stepladder.  Make sure that both sides of the stepladder are locked into place.  Ask someone to hold it for you, if possible.  Clearly mark and make sure that you can see:  Any grab bars or handrails.  First and last steps.  Where the edge of each step is.  Use tools that help you move around (mobility aids) if they are needed. These include:  Canes.  Walkers.  Scooters.  Crutches.  Turn on the lights when you go into a dark area. Replace any light bulbs as soon as they burn out.  Set up your furniture so you have a clear path. Avoid moving your furniture around.  If any of your floors are uneven, fix them.  If there are any pets around you, be aware of where they are.  Review your medicines with your doctor. Some medicines can make you feel dizzy. This can increase your chance of falling. Ask your doctor what other things that you can do to help prevent falls. This information is not intended to replace advice given to you by your health care provider. Make sure  you discuss any questions you have with your health care  provider. Document Released: 08/27/2009 Document Revised: 04/07/2016 Document Reviewed: 12/05/2014  2017 Elsevier

## 2016-11-15 NOTE — Progress Notes (Addendum)
Subjective:   [name] is a 76 y.o. female who presents for Medicare Annual (Subsequent) preventive examination.  Review of Systems:   No ROS.  Medicare Wellness Visit.  Cardiac Risk Factors include: advanced age (>32men, >73 women)     Objective:     Vitals: BP 118/78   Pulse (!) 54   Temp 97.4 F (36.3 C) (Oral)   Wt 232 lb (105.2 kg)   SpO2 98%   BMI 38.61 kg/m   Body mass index is 38.61 kg/m.   Tobacco History  Smoking Status  . Never Smoker  Smokeless Tobacco  . Never Used     Counseling given: Not Answered   Past Medical History:  Diagnosis Date  . Arthritis    bitateral knees and left foot,s/p cortisone injection in past  . Chronic constipation   . Endometrial cancer ([place])   . Hypertension   . IBS (irritable bowel syndrome)   . Menopause    age 73  . Vitamin D deficiency 06/07/10   Past Surgical History:  Procedure Laterality Date  . ABDOMINAL HYSTERECTOMY    . BUNIONECTOMY    . DILATION AND CURETTAGE OF UTERUS  1971  . FOOT SURGERY    . VAGINAL DELIVERY     Family History  Problem Relation Age of Onset  . Heart disease Father 81    massive MI  . Diabetes Father   . Diabetes Sister   . Cancer Sister     breast  . Bipolar disorder Sister   . Osteoporosis Sister   . Breast cancer Sister 60  . Bipolar disorder Maternal Aunt   . Cancer Maternal Grandfather     colon cancer  . Osteoporosis Mother    History  Sexual Activity  . Sexual activity: No    Outpatient Encounter Prescriptions as of 11/15/2016  Medication Sig  . docusate sodium (COLACE) 100 MG capsule Take 100 mg by mouth daily.  . fluticasone (FLONASE) 50 MCG/ACT nasal spray Place 2 sprays into both nostrils daily.  Marland Kitchen losartan (COZAAR) 50 MG tablet TAKE 1 TABLET EVERY DAY  . metoprolol succinate (TOPROL-XL) 100 MG 24 hr tablet Take 100 mg by mouth daily.   . Multiple Vitamins-Minerals (MULTIVITAMIN WITH MINERALS) tablet Take 1 tablet by mouth daily.  . Multiple  Vitamins-Minerals (PRESERVISION/LUTEIN PO) Take by mouth.  . Polyethylene Glycol 3350 (MIRALAX PO) Take by mouth daily as needed.  . simvastatin (ZOCOR) 20 MG tablet Take 1 tablet (20 mg total) by mouth every evening.  . topiramate (TOPAMAX) 50 MG tablet Take 50 mg by mouth 2 (two) times daily.   . ciprofloxacin (CILOXAN) 0.3 % ophthalmic solution 1-2 gtt in eye(s) q2h while awake x 2 days, then q4h x 5 days.   No facility-administered encounter medications on file as of 11/15/2016.     Activities of Daily Living In your present state of health, do you have any difficulty performing the following activities: 11/15/2016  Hearing? Y  Vision? N  Difficulty concentrating or making decisions? N  Walking or climbing stairs? Y  Dressing or bathing? N  Doing errands, shopping? N  Preparing Food and eating ? N  Using the Toilet? N  In the past six months, have you accidently leaked urine? N  Do you have problems with loss of bowel control? N  Managing your Medications? N  Managing your Finances? N  Housekeeping or managing your Housekeeping? N  Some recent data might be hidden    Patient Care  Team: Burnard Hawthorne, FNP as PCP - General (Family Medicine) Vladimir Crofts, MD (Neurology) Noreene Filbert, MD as Referring Physician (Radiation Oncology)    Assessment:    This is a routine wellness examination for [place]. The goal of the wellness visit is to assist the patient how to close the gaps in care and create a preventative care plan for the patient.   Osteoporosis risk reviewed.  Medications reviewed; taking without issues or barriers.  Safety issues reviewed; lives alone.  Smoke detectors in the home. No firearms in the home. Wears seatbelts when driving or riding with others. No violence in the home.  No identified risk were noted; The patient was oriented x 3; appropriate in dress and manner and no objective failures at ADL's or IADL's.   BMI; discussed the importance of a  healthy diet, water intake and exercise. She has a sensible diet, adequate water intake and plans to increase her physical activity.  Educational material provided.  HTN; followed by PCP.  Health maintenance gaps; closed.  Patient Concerns: None at this time. Follow up with PCP as needed.  Exercise Activities and Dietary recommendations Current Exercise Habits: Home exercise routine, Type of exercise: walking, Time (Minutes): 30, Frequency (Times/Week): 5, Weekly Exercise (Minutes/Week): 150, Intensity: Mild  Goals    . Increase physical activity    . Weight < 200 lb (90.719 kg)      Fall Risk Fall Risk  11/15/2016 08/16/2016 04/15/2015  Falls in the past year? Yes No No  Number falls in past yr: 1 - -  Injury with Fall? No - -  Follow up Falls prevention discussed - -   Depression Screen PHQ 2/9 Scores 11/15/2016 08/16/2016 04/15/2015  PHQ - 2 Score 0 0 0     Cognitive Function MMSE - Mini Mental State Exam 04/15/2015  Orientation to time 5  Orientation to Place 5  Registration 3  Attention/ Calculation 5  Recall 3  Language- name 2 objects 2  Language- repeat 1  Language- follow 3 step command 3  Language- read & follow direction 1  Language-read & follow direction-comments Patient loves to read.  Write a sentence 1  Copy design 1  Total score 30     6CIT Screen 11/15/2016  What Year? 0 points  What month? 0 points  What time? 0 points  Count back from 20 0 points  Months in reverse 0 points  Repeat phrase 0 points  Total Score 0    Immunization History  Administered Date(s) Administered  . Pneumococcal Conjugate-13 04/15/2015  . Pneumococcal Polysaccharide-23 07/25/2013  . Tdap 08/17/2016  . Zoster 07/27/2011   Screening Tests Health Maintenance  Topic Date Due  . INFLUENZA VACCINE  11/15/2017 (Originally 06/14/2016)  . COLONOSCOPY  05/05/2018  . TETANUS/TDAP  08/17/2026  . DEXA SCAN  Completed  . ZOSTAVAX  Addressed  . PNA vac Low Risk Adult  Completed       Plan:    End of life planning; Advance aging; Advanced directives discussed. Copy of current HCPOA/Living Will requested.  Medicare Attestation I have personally reviewed: The patient's medical and social history Their use of alcohol, tobacco or illicit drugs Their current medications and supplements The patient's functional ability including ADLs,fall risks, home safety risks, cognitive, and hearing and visual impairment Diet and physical activities Evidence for depression   The patient's weight, height, BMI, and visual acuity have been recorded in the chart.  I have made referrals and provided education to the  patient based on review of the above and I have provided the patient with a written personalized care plan for preventive services.    During the course of the visit the patient was educated and counseled about the following appropriate screening and preventive services:   Vaccines to include Pneumoccal, Influenza, Hepatitis B, Td, Zostavax, HCV  Electrocardiogram  Cardiovascular Disease  Colorectal cancer screening  Bone density screening  Diabetes screening  Glaucoma screening  Mammography/PAP  Nutrition counseling   Patient Instructions (the written plan) was given to the patient.   Varney Biles, LPN  [id number]   Agree with Yecenia Dalgleish with above plan [name], NP

## 2016-11-15 NOTE — Progress Notes (Signed)
Pre visit review using our clinic review tool, if applicable. No additional management support is needed unless otherwise documented below in the visit note. 

## 2016-11-16 ENCOUNTER — Encounter: Payer: Self-pay | Admitting: Family

## 2016-11-17 ENCOUNTER — Encounter: Payer: Self-pay | Admitting: Family

## 2016-11-21 ENCOUNTER — Encounter: Payer: Self-pay | Admitting: Family

## 2016-11-22 ENCOUNTER — Encounter: Payer: Self-pay | Admitting: Family

## 2016-12-05 DIAGNOSIS — H353190 Nonexudative age-related macular degeneration, unspecified eye, stage unspecified: Secondary | ICD-10-CM | POA: Diagnosis not present

## 2016-12-06 DIAGNOSIS — H353221 Exudative age-related macular degeneration, left eye, with active choroidal neovascularization: Secondary | ICD-10-CM | POA: Diagnosis not present

## 2016-12-29 ENCOUNTER — Encounter: Payer: Self-pay | Admitting: Family

## 2016-12-29 ENCOUNTER — Ambulatory Visit (INDEPENDENT_AMBULATORY_CARE_PROVIDER_SITE_OTHER): Payer: Commercial Managed Care - HMO | Admitting: Family

## 2016-12-29 VITALS — BP 112/62 | HR 67 | Temp 98.9°F | Ht 65.0 in

## 2016-12-29 DIAGNOSIS — B351 Tinea unguium: Secondary | ICD-10-CM | POA: Insufficient documentation

## 2016-12-29 MED ORDER — EFINACONAZOLE 10 % EX SOLN: 1.0000 [drp] | Freq: Every day | CUTANEOUS | 3 refills | Status: DC

## 2016-12-29 NOTE — Progress Notes (Signed)
Pre visit review using our clinic review tool, if applicable. No additional management support is needed unless otherwise documented below in the visit note. 

## 2016-12-29 NOTE — Progress Notes (Signed)
Subjective:    Patient ID: [name], female    DOB: Mar 22, 1941, 76 y.o.   MRN: [id number]  CC: [name] is a 76 y.o. female who presents today for an acute visit.    HPI: CC: toenails thick and discolored for years. Unchanged. No drainage, tenderness, fever. Hasn't tried any medications.  No DM      HISTORY:  Past Medical History:  Diagnosis Date  . Arthritis    bitateral knees and left foot,s/p cortisone injection in past  . Chronic constipation   . Endometrial cancer ([place])   . Hypertension   . IBS (irritable bowel syndrome)   . Menopause    age 50  . Vitamin D deficiency 06/07/10   Past Surgical History:  Procedure Laterality Date  . ABDOMINAL HYSTERECTOMY    . BUNIONECTOMY    . DILATION AND CURETTAGE OF UTERUS  1971  . FOOT SURGERY    . VAGINAL DELIVERY     Family History  Problem Relation Age of Onset  . Heart disease Father 4    massive MI  . Diabetes Father   . Diabetes Sister   . Cancer Sister     breast  . Bipolar disorder Sister   . Osteoporosis Sister   . Breast cancer Sister 33  . Bipolar disorder Maternal Aunt   . Cancer Maternal Grandfather     colon cancer  . Osteoporosis Mother     Allergies: Codeine Current Outpatient Prescriptions on File Prior to Visit  Medication Sig Dispense Refill  . ciprofloxacin (CILOXAN) 0.3 % ophthalmic solution 1-2 gtt in eye(s) q2h while awake x 2 days, then q4h x 5 days. 5 mL 0  . docusate sodium (COLACE) 100 MG capsule Take 100 mg by mouth daily.    . fluticasone (FLONASE) 50 MCG/ACT nasal spray Place 2 sprays into both nostrils daily. 16 g 2  . losartan (COZAAR) 50 MG tablet TAKE 1 TABLET EVERY DAY 90 tablet 3  . metoprolol succinate (TOPROL-XL) 100 MG 24 hr tablet Take 100 mg by mouth daily.     . Multiple Vitamins-Minerals (MULTIVITAMIN WITH MINERALS) tablet Take 1 tablet by mouth daily.    . Multiple Vitamins-Minerals (PRESERVISION/LUTEIN PO) Take by mouth.    . Polyethylene Glycol 3350 (MIRALAX  PO) Take by mouth daily as needed.    . simvastatin (ZOCOR) 20 MG tablet Take 1 tablet (20 mg total) by mouth every evening. 90 tablet 3  . topiramate (TOPAMAX) 50 MG tablet Take 50 mg by mouth 2 (two) times daily.      No current facility-administered medications on file prior to visit.     Social History  Substance Use Topics  . Smoking status: Never Smoker  . Smokeless tobacco: Never Used  . Alcohol use Yes     Comment: socially/rarely    Review of Systems  Constitutional: Negative for chills and fever.  Respiratory: Negative for cough.   Cardiovascular: Negative for chest pain and palpitations.  Gastrointestinal: Negative for nausea and vomiting.      Objective:    BP 112/62   Pulse 67   Temp 98.9 F (37.2 C) (Oral)   Ht 5\' 5"  (1.651 m)   SpO2 98%    Physical Exam  Constitutional: She appears well-developed and well-nourished.  Eyes: Conjunctivae are normal.  Cardiovascular: Normal rate, regular rhythm, normal heart sounds and normal pulses.   Pulmonary/Chest: Effort normal and breath sounds normal. She has no wheezes. She has no rhonchi. She  has no rales.  Neurological: She is alert.  Skin: Skin is warm and dry.  Thick yellow colored nailbeds bilaterally great toes  Psychiatric: She has a normal mood and affect. Her speech is normal and behavior is normal. Thought content normal.  Vitals reviewed.      Assessment & Plan:   1. Onychomycosis Of great toes. Discussed limited results of topical medication and may have to go to podiatry for oral medication. Will trial topical medication.   - Efinaconazole 10 % SOLN; Apply 1-2 drops topically daily. To great toenail,  an additional drop should be applied at the end of the toenail.  Dispense: 1 Bottle; Refill: 3    I am having Ms. Desantis maintain her multivitamin with minerals, metoprolol succinate, topiramate, docusate sodium, Polyethylene Glycol 3350 (MIRALAX PO), Multiple Vitamins-Minerals (PRESERVISION/LUTEIN  PO), losartan, fluticasone, simvastatin, and ciprofloxacin.   No orders of the defined types were placed in this encounter.   Return precautions given.   Risks, benefits, and alternatives of the medications and treatment plan prescribed today were discussed, and patient expressed understanding.   Education regarding symptom management and diagnosis given to patient on AVS.  Continue to follow with [name], FNP for routine health maintenance.   [name] and I agreed with plan.   [name], FNP

## 2016-12-29 NOTE — Patient Instructions (Signed)
Trial topical antifungal - may need to give several months prior to seeing difference.  If you get no results or frustrated by this, I can refer you to podiatry.   Fungal Nail Infection Introduction Fungal nail infection is a common fungal infection of the toenails or fingernails. This condition affects toenails more often than fingernails. More than one nail may be infected. The condition can be passed from person to person (is contagious). What are the causes? This condition is caused by a fungus. Several types of funguses can cause the infection. These funguses are common in moist and warm areas. If your hands or feet come into contact with the fungus, it may get into a crack in your fingernail or toenail and cause the infection. What increases the risk? The following factors may make you more likely to develop this condition:  Being female.  Having diabetes.  Being of older age.  Living with someone who has the fungus.  Walking barefoot in areas where the fungus thrives, such as showers or locker rooms.  Having poor circulation.  Wearing shoes and socks that cause your feet to sweat.  Having athlete's foot.  Having a nail injury or history of a recent nail surgery.  Having psoriasis.  Having a weak body defense system (immune system). What are the signs or symptoms? Symptoms of this condition include:  A pale spot on the nail.  Thickening of the nail.  A nail that becomes yellow or brown.  A brittle or ragged nail edge.  A crumbling nail.  A nail that has lifted away from the nail bed. How is this diagnosed? This condition is diagnosed with a physical exam. Your health care provider may take a scraping or clipping from your nail to test for the fungus. How is this treated? Mild infections do not need treatment. If you have significant nail changes, treatment may include:  Oral antifungal medicines. You may need to take the medicine for several weeks or several  months, and you may not see the results for a long time. These medicines can cause side effects. Ask your health care provider what problems to watch for.  Antifungal nail polish and nail cream. These may be used along with oral antifungal medicines.  Laser treatment of the nail.  Surgery to remove the nail. This may be needed for the most severe infections. Treatment takes a long time, and the infection may come back. Follow these instructions at home: Medicines  Take or apply over-the-counter and prescription medicines only as told by your health care provider.  Ask your health care provider about using over-the-counter mentholated ointment on your nails. Lifestyle  Do not share personal items, such as towels or nail clippers.  Trim your nails often.  Wash and dry your hands and feet every day.  Wear absorbent socks, and change your socks frequently.  Wear shoes that allow air to circulate, such as sandals or canvas tennis shoes. Throw out old shoes.  Wear rubber gloves if you are working with your hands in wet areas.  Do not walk barefoot in shower rooms or locker rooms.  Do not use a nail salon that does not use clean instruments.  Do not use artificial nails. General instructions  Keep all follow-up visits as told by your health care provider. This is important.  Use antifungal foot powder on your feet and in your shoes. Contact a health care provider if: Your infection is not getting better or it is getting worse after  several months. This information is not intended to replace advice given to you by your health care provider. Make sure you discuss any questions you have with your health care provider. Document Released: 10/28/2000 Document Revised: 04/07/2016 Document Reviewed: 05/04/2015  2017 Elsevier

## 2017-01-03 ENCOUNTER — Encounter: Payer: Self-pay | Admitting: Family

## 2017-01-04 ENCOUNTER — Encounter: Payer: Self-pay | Admitting: Family

## 2017-01-06 DIAGNOSIS — H353221 Exudative age-related macular degeneration, left eye, with active choroidal neovascularization: Secondary | ICD-10-CM | POA: Diagnosis not present

## 2017-01-25 ENCOUNTER — Encounter: Payer: Self-pay | Admitting: Obstetrics and Gynecology

## 2017-01-25 ENCOUNTER — Inpatient Hospital Stay: Payer: Medicare HMO | Attending: Obstetrics and Gynecology | Admitting: Obstetrics and Gynecology

## 2017-01-25 VITALS — BP 120/78 | HR 56 | Temp 97.1°F | Resp 18 | Ht 65.0 in | Wt 235.7 lb

## 2017-01-25 DIAGNOSIS — Z923 Personal history of irradiation: Secondary | ICD-10-CM | POA: Diagnosis not present

## 2017-01-25 DIAGNOSIS — K5909 Other constipation: Secondary | ICD-10-CM | POA: Diagnosis not present

## 2017-01-25 DIAGNOSIS — B351 Tinea unguium: Secondary | ICD-10-CM | POA: Insufficient documentation

## 2017-01-25 DIAGNOSIS — Z79899 Other long term (current) drug therapy: Secondary | ICD-10-CM | POA: Insufficient documentation

## 2017-01-25 DIAGNOSIS — Z90710 Acquired absence of both cervix and uterus: Secondary | ICD-10-CM | POA: Insufficient documentation

## 2017-01-25 DIAGNOSIS — M8580 Other specified disorders of bone density and structure, unspecified site: Secondary | ICD-10-CM | POA: Insufficient documentation

## 2017-01-25 DIAGNOSIS — C541 Malignant neoplasm of endometrium: Secondary | ICD-10-CM | POA: Diagnosis not present

## 2017-01-25 DIAGNOSIS — Z90722 Acquired absence of ovaries, bilateral: Secondary | ICD-10-CM | POA: Diagnosis not present

## 2017-01-25 DIAGNOSIS — H3530 Unspecified macular degeneration: Secondary | ICD-10-CM | POA: Diagnosis not present

## 2017-01-25 DIAGNOSIS — I10 Essential (primary) hypertension: Secondary | ICD-10-CM | POA: Diagnosis not present

## 2017-01-25 NOTE — Progress Notes (Signed)
Gynecologic Oncology Consult Visit   Referring Provider: Dr Ronette Deter  Chief Concern: Stage IA endometrial cancer  Subjective:  [name] is a 76 y.o. female who is seen in consultation from Dr. Gilford Rile for endometrial cancer.  [name] presents for surveillance for stage Ia, grade 1 endometrial cancer. She has no new gyn or general medical complaints.   She has a PCP, obtains her mammograms at the breast imaging center. Her family history is remarkable forbreast cancer in her sister, and colon cancer in her mother (late onset) and grandmother.    HPI 11/2013 adenocarcinoma of the endometrium stage Ia, (large tumor), grade 1,            TLHBSO and staging, vaginal brachytherapy   Diagnosis: Part A: RIGHT PELVIC LYMPH NODES: - NEGATIVE FOR MALIGNANCY, SIX LYMPH NODES (0/6). . Part B: LEFT PELVIC LYMPH NODES: - NEGATIVE FOR MALIGNANCY, TWO LYMPH NODES (0/2). . Part C: UTERUS, HYSTERECTOMY: - ENDOMETRIOID ADENOCARCINOMA, FIGO GRADE 1, SEE SUMMARY BELOW. - SUBSEROSAL AND INTRAMURAL LEIOMYOMAS. Marland Kitchen RIGHT AND LEFT OVARIES, OOPHORECTOMY: - SURFACE EPITHELIAL INCLUSION GLANDS. - NEGATIVE FOR MALIGNANCY. Marland Kitchen RIGHT AND LEFT FALLOPIAN TUBES, SALPINGECTOMY: - NEGATIVE FOR MALIGNANCY. . Part D: OMENTUM BIOPSY: - NEGATIVE FOR MALIGNANCY. . NOTE: The uterus is severely distorted by leiomyomas. The tumor shows autolytic changes from delayed fixation, and it is difficult to identify the endomyometrial junction in many areas. No usual-type myometrial invasion is identified; superficial myometrial invasion of the "pushing" type cannot be entirely excluded, but there is no evidence of deep myometrial invasion. Tumor is 7 mm from the serosa at the closest point. Marland Kitchen CANCER CASE SUMMARY: ENDOMETRIUM, HYSTERECTOMY SPECIMEN: Uterine corpus, cervix, bilateral ovaries and fallopian tubes, and omentum PROCEDURE: Simple hysterectomy, bilateral salpingo-oophorectomy, omental biopsy LYMPH NODE  SAMPLING: Pelvic lymph nodes SPECIMEN INTEGRITY: Intact hysterectomy specimen TUMOR SIZE: Greatest dimension: 7 cm HISTOLOGIC TYPE: Endometrioid adenocarcinoma HISTOLOGIC GRADE: FIGO grade 1 MYOMETRIAL INVASION: <50% myometrial invasion INVOLVEMENT OF CERVIX: Not involved EXTENT OF INVOLVEMENT OF OTHER ORGANS: Not involved: ovaries, fallopian tubes, and omentum LYMPH-VASCULAR INVASION: Not identified  Problem List: Patient Active Problem List   Diagnosis Date Noted  . Onychomycosis 12/29/2016  . Osteopenia 08/16/2016  . Macular degeneration 08/12/2015  . Routine general medical examination at a health care facility 09/26/2014  . Chronic constipation 05/27/2014  . Bartholin cyst 03/28/2014  . Endometrial cancer ([place]) 12/30/2013  . Essential hypertension, benign 04/17/2013  . Tremor 07/08/2011    Past Medical History: Past Medical History:  Diagnosis Date  . Arthritis    bitateral knees and left foot,s/p cortisone injection in past  . Chronic constipation   . Endometrial cancer ([place])   . Hypertension   . IBS (irritable bowel syndrome)   . Menopause    age 62  . Vitamin D deficiency 06/07/10    Past Surgical History: Past Surgical History:  Procedure Laterality Date  . ABDOMINAL HYSTERECTOMY    . BUNIONECTOMY    . DILATION AND CURETTAGE OF UTERUS  1971  . FOOT SURGERY    . VAGINAL DELIVERY     Family History: Family History  Problem Relation Age of Onset  . Heart disease Father 58    massive MI  . Diabetes Father   . Diabetes Sister   . Cancer Sister     breast  . Bipolar disorder Sister   . Osteoporosis Sister   . Breast cancer Sister 29  . Bipolar disorder Maternal Aunt   . Cancer Maternal Grandfather  colon cancer  . Osteoporosis Mother    Social History: Social History   Social History  . Marital status: Divorced    Spouse name: N/A  . Number of children: N/A  . Years of education: N/A   Occupational History  . Not on file.   Social  History Main Topics  . Smoking status: Never Smoker  . Smokeless tobacco: Never Used  . Alcohol use Yes     Comment: socially/rarely  . Drug use: No  . Sexual activity: No   Other Topics Concern  . Not on file   Social History Narrative   Exercise- aerobic exercise at home 3-4x week.       Lives in [place]. No pets.    Allergies: Allergies  Allergen Reactions  . Codeine     Nausea and vomiting    Current Medications: Current Outpatient Prescriptions  Medication Sig Dispense Refill  . ciprofloxacin (CILOXAN) 0.3 % ophthalmic solution 1-2 gtt in eye(s) q2h while awake x 2 days, then q4h x 5 days. 5 mL 0  . docusate sodium (COLACE) 100 MG capsule Take 100 mg by mouth daily.    . Efinaconazole 10 % SOLN Apply 1-2 drops topically daily. To great toenail,  an additional drop should be applied at the end of the toenail. 1 Bottle 3  . fluticasone (FLONASE) 50 MCG/ACT nasal spray Place 2 sprays into both nostrils daily. 16 g 2  . losartan (COZAAR) 50 MG tablet TAKE 1 TABLET EVERY DAY 90 tablet 3  . metoprolol succinate (TOPROL-XL) 100 MG 24 hr tablet Take 100 mg by mouth daily.     . Multiple Vitamins-Minerals (MULTIVITAMIN WITH MINERALS) tablet Take 1 tablet by mouth daily.    . Multiple Vitamins-Minerals (PRESERVISION AREDS 2) CAPS Take 1 capsule by mouth 2 (two) times daily.    . Polyethylene Glycol 3350 (MIRALAX PO) Take by mouth daily as needed.    . simvastatin (ZOCOR) 20 MG tablet Take 1 tablet (20 mg total) by mouth every evening. 90 tablet 3  . topiramate (TOPAMAX) 50 MG tablet Take 50 mg by mouth 2 (two) times daily.      No current facility-administered medications for this visit.     Review of Systems   Per interval history  Objective:  Physical Examination:  There were no vitals taken for this visit.   ECOG Performance Status: 0 - Asymptomatic  General appearance: alert, cooperative and appears stated age HEENT:PERRLA, neck supple with midline trachea and  thyroid without masses Lymph node survey: non-palpable, axillary, inguinal, supraclavicular Cardiovascular: regular rate and rhythm, no murmurs or gallops Respiratory: normal air entry, lungs clear to auscultation Abdomen: soft, non-tender, without masses or organomegaly, no hernias and well healed incision Back: inspection of back is normal Extremities: extremities normal, atraumatic, no cyanosis or edema Skin exam - normal coloration and turgor, no rashes, no suspicious skin lesions noted. Neurological exam reveals alert, oriented, normal speech, no focal findings or movement disorder noted.  Pelvic: exam chaperoned by nurse;  Vulva: normal appearing vulva with no masses, tenderness or lesions; Vagina: normal vagina; Adnexa: normal adnexa in size, nontender and no masses no masses; Rectal: no masses     Assessment:  [name] is a 76 y.o. female diagnosed with stage IA grade 1 endometrial adenocarcinoma s/p TLH/BSO staging and vaginal brachytherapy 1/15.    Plan:   Problem List Items Addressed This Visit      Genitourinary   Endometrial cancer ([place]) - Primary  [name] will be re-evaluated in 6 months by Dr. Baruch Gouty and RTC to gynecologic oncology clinic in one year. After that she can be released to her primary care for follow up.  I urged her to follow up with her PCP for colon cancer screening and to continue her screening mammograms given her family history of cancer.    Mellody Drown, MD   CC:  Jackolyn Confer, MD No address on file

## 2017-01-25 NOTE — Progress Notes (Signed)
  Oncology Nurse Navigator Documentation Chaperoned pelvic exam. Follow up in one year. Navigator Location: [facility] (01/25/17 1100)   )Navigator Encounter Type: Follow-up Appt (01/25/17 1100)                     Patient Visit Type: GynOnc (01/25/17 1100)                              Time Spent with Patient: 15 (01/25/17 1100)

## 2017-02-01 ENCOUNTER — Ambulatory Visit: Payer: Commercial Managed Care - HMO

## 2017-02-13 ENCOUNTER — Other Ambulatory Visit: Payer: Self-pay | Admitting: Family

## 2017-02-13 ENCOUNTER — Encounter: Payer: Self-pay | Admitting: Family

## 2017-02-13 DIAGNOSIS — E785 Hyperlipidemia, unspecified: Secondary | ICD-10-CM

## 2017-02-14 ENCOUNTER — Encounter: Payer: Self-pay | Admitting: Family

## 2017-02-14 ENCOUNTER — Ambulatory Visit (INDEPENDENT_AMBULATORY_CARE_PROVIDER_SITE_OTHER): Payer: Medicare HMO | Admitting: Family

## 2017-02-14 DIAGNOSIS — R001 Bradycardia, unspecified: Secondary | ICD-10-CM

## 2017-02-14 DIAGNOSIS — K5909 Other constipation: Secondary | ICD-10-CM | POA: Diagnosis not present

## 2017-02-14 NOTE — Patient Instructions (Signed)
Heart rate is very low and suspect this is from your metoprolol.   Please come back for a nurse visit to check blood pressure and heart rate. If still less than 60, we should consider decreasing metoprolol to 75mg  daily.   Ask [employer] about:  Amitiza  Linzess  Cologuard  Let me know what you find out so I know what order. You may also google coupons for amitiza or linzess and a lot of times will find one.

## 2017-02-14 NOTE — Assessment & Plan Note (Signed)
Chronic. More frustrated by this as of late. Good bowel sounds and last BM 3 days ago. Discussed at length normal agents including Amitiza, linzess which are proved for IBS, constipation predominant. Patient wanted to try in the past however these were quite expensive. We jointly agreed that she would call insurance company to get information on prices and we will go from there. We also discussed family history colon cancer and how she's very likely overdue for colonoscopy. She's had poor experience colonoscopies in the past and politely declines a repeat. She would like to consider Cologuard however would like to check his other insurance first.

## 2017-02-14 NOTE — Progress Notes (Signed)
Subjective:    Patient ID: [name], female    DOB: 1941-08-10, 76 y.o.   MRN: [id number]  CC: [name] is a 76 y.o. female who presents today for follow up.   HPI: IBS: More bloating, constipation, flatulence for past couple of weeks. No belching or abdominal pain or cramping.   Miralax, colace, and magnesium without relief. Never tried linzess due to price.   Last BM 3 days ago.   Last colonoscopy 8 years ago. No polyps. Told to come back in 10 years. Strong family h/o colon cancer.      HISTORY:  Past Medical History:  Diagnosis Date  . Arthritis    bitateral knees and left foot,s/p cortisone injection in past  . Chronic constipation   . Endometrial cancer ([place])   . Hypertension   . IBS (irritable bowel syndrome)   . Menopause    age 57  . Vitamin D deficiency 06/07/10   Past Surgical History:  Procedure Laterality Date  . ABDOMINAL HYSTERECTOMY    . BUNIONECTOMY    . DILATION AND CURETTAGE OF UTERUS  1971  . FOOT SURGERY    . VAGINAL DELIVERY     Family History  Problem Relation Age of Onset  . Heart disease Father 23    massive MI  . Diabetes Father   . Cancer Sister     breast  . Bipolar disorder Sister   . Osteoporosis Sister   . Breast cancer Sister 45  . Bipolar disorder Maternal Aunt   . Breast cancer Maternal Aunt   . Cancer Maternal Grandfather     colon cancer  . Colon cancer Maternal Grandfather 55  . Osteoporosis Mother   . Colon cancer Mother     Allergies: Codeine Current Outpatient Prescriptions on File Prior to Visit  Medication Sig Dispense Refill  . docusate sodium (COLACE) 100 MG capsule Take 100 mg by mouth daily.    . fluticasone (FLONASE) 50 MCG/ACT nasal spray Place 2 sprays into both nostrils daily. (Patient taking differently: Place 2 sprays into both nostrils daily as needed. ) 16 g 2  . losartan (COZAAR) 50 MG tablet TAKE 1 TABLET EVERY DAY 90 tablet 3  . metoprolol succinate (TOPROL-XL) 100 MG 24 hr tablet Take  100 mg by mouth daily.     . Multiple Vitamins-Minerals (MULTIVITAMIN WITH MINERALS) tablet Take 1 tablet by mouth daily.    . Multiple Vitamins-Minerals (PRESERVISION AREDS 2) CAPS Take 1 capsule by mouth 2 (two) times daily.    . Polyethylene Glycol 3350 (MIRALAX PO) Take by mouth daily as needed.    . simvastatin (ZOCOR) 20 MG tablet Take 1 tablet (20 mg total) by mouth every evening. 90 tablet 3  . topiramate (TOPAMAX) 50 MG tablet Take 50 mg by mouth 2 (two) times daily.      No current facility-administered medications on file prior to visit.     Social History  Substance Use Topics  . Smoking status: Never Smoker  . Smokeless tobacco: Never Used  . Alcohol use Yes     Comment: socially/rarely    Review of Systems  Constitutional: Negative for chills and fever.  Respiratory: Negative for cough.   Cardiovascular: Negative for chest pain and palpitations.  Gastrointestinal: Positive for abdominal distention and constipation. Negative for abdominal pain, blood in stool, diarrhea, nausea and vomiting.  Neurological: Negative for dizziness and syncope.      Objective:    BP (!) 142/68  Pulse (!) 50   Temp 97.4 F (36.3 C) (Oral)   Ht 5\' 5"  (1.651 m)   SpO2 98%  BP Readings from Last 3 Encounters:  02/14/17 (!) 142/68  01/25/17 120/78  12/29/16 112/62   Wt Readings from Last 3 Encounters:  01/25/17 235 lb 10.8 oz (106.9 kg)  11/15/16 232 lb (105.2 kg)  08/18/16 231 lb 13 oz (105.2 kg)    Physical Exam  Constitutional: She appears well-developed and well-nourished.  Eyes: Conjunctivae are normal.  Cardiovascular: Normal rate, regular rhythm, normal heart sounds and normal pulses.   Pulmonary/Chest: Effort normal and breath sounds normal. She has no wheezes. She has no rhonchi. She has no rales.  Abdominal: Soft. Normal appearance and bowel sounds are normal. She exhibits no distension, no fluid wave, no ascites and no mass. There is no tenderness. There is no  rigidity, no rebound, no guarding and no CVA tenderness.  Obese abdomen.  Neurological: She is alert.  Skin: Skin is warm and dry.  Psychiatric: She has a normal mood and affect. Her speech is normal and behavior is normal. Thought content normal.  Vitals reviewed.      Assessment & Plan:   Problem List Items Addressed This Visit      Digestive   Chronic constipation    Chronic. More frustrated by this as of late. Good bowel sounds and last BM 3 days ago. Discussed at length normal agents including Amitiza, linzess which are proved for IBS, constipation predominant. Patient wanted to try in the past however these were quite expensive. We jointly agreed that she would call insurance company to get information on prices and we will go from there. We also discussed family history colon cancer and how she's very likely overdue for colonoscopy. She's had poor experience colonoscopies in the past and politely declines a repeat. She would like to consider Cologuard however would like to check his other insurance first.        Other   Bradycardia    Reassured that clinically Asymptomatic today. Discussed with patient likely metoprolol playing role in this. She was a little nervous to decrease dose as on metoprolol for her blood pressure. I advised her to come back for nurse's visit to recheck heart rate in a couple days. If stays less than 60, I will likely advised patient to decrease dose to 75mg .          I am having Ms. Konkle maintain her multivitamin with minerals, metoprolol succinate, topiramate, docusate sodium, Polyethylene Glycol 3350 (MIRALAX PO), losartan, fluticasone, simvastatin, and PRESERVISION AREDS 2.   No orders of the defined types were placed in this encounter.   Return precautions given.   Risks, benefits, and alternatives of the medications and treatment plan prescribed today were discussed, and patient expressed understanding.   Education regarding symptom  management and diagnosis given to patient on AVS.  Continue to follow with [name], FNP for routine health maintenance.   [name] and I agreed with plan.   [name], FNP

## 2017-02-14 NOTE — Progress Notes (Signed)
Pre visit review using our clinic review tool, if applicable. No additional management support is needed unless otherwise documented below in the visit note. 

## 2017-02-14 NOTE — Assessment & Plan Note (Signed)
Reassured that clinically Asymptomatic today. Discussed with patient likely metoprolol playing role in this. She was a little nervous to decrease dose as on metoprolol for her blood pressure. I advised her to come back for nurse's visit to recheck heart rate in a couple days. If stays less than 60, I will likely advised patient to decrease dose to 75mg .

## 2017-02-17 ENCOUNTER — Other Ambulatory Visit (INDEPENDENT_AMBULATORY_CARE_PROVIDER_SITE_OTHER): Payer: Medicare HMO

## 2017-02-17 ENCOUNTER — Ambulatory Visit (INDEPENDENT_AMBULATORY_CARE_PROVIDER_SITE_OTHER): Payer: Medicare HMO

## 2017-02-17 VITALS — BP 128/76 | HR 62 | Resp 14

## 2017-02-17 DIAGNOSIS — E785 Hyperlipidemia, unspecified: Secondary | ICD-10-CM | POA: Diagnosis not present

## 2017-02-17 DIAGNOSIS — R001 Bradycardia, unspecified: Secondary | ICD-10-CM

## 2017-02-17 LAB — LIPID PANEL
CHOLESTEROL: 175 mg/dL (ref 0–200)
HDL: 61.5 mg/dL (ref >39.00)
LDL CALC: 90 mg/dL (ref 0–99)
NonHDL: 113.93
TRIGLYCERIDES: 121 mg/dL (ref 0.0–149.0)
Total CHOL/HDL Ratio: 3
VLDL: 24.2 mg/dL (ref 0.0–40.0)

## 2017-02-17 NOTE — Progress Notes (Addendum)
Patient comes in for pulse check from office visit on visit 02/14/17   .    She is on metoprolol l  100 mg 1 daily.    Patient declines dizziness .  Please advise.     Noted- discussed with patient over phone. Margaret, np   I have reviewed the above information and agree with above. No changes today  Deborra Medina, MD

## 2017-02-20 ENCOUNTER — Encounter: Payer: Self-pay | Admitting: Family

## 2017-02-21 ENCOUNTER — Telehealth: Payer: Self-pay | Admitting: Family

## 2017-02-21 DIAGNOSIS — E785 Hyperlipidemia, unspecified: Secondary | ICD-10-CM

## 2017-02-21 MED ORDER — SIMVASTATIN 20 MG PO TABS: 20.0000 mg | ORAL_TABLET | Freq: Every evening | ORAL | 4 refills | Status: DC

## 2017-02-21 NOTE — Telephone Encounter (Signed)
I have no formulary for [facility] prescribe what you think is best and once submitted we can do pA if needed? The stoll kit we can give her one from here to take home for stool culture or C-diff depending on what you  Are looking for .

## 2017-02-21 NOTE — Telephone Encounter (Signed)
    [name], please call [employer] and see exactly what needs to be ordered for a stool 'Home access' kit. That order is NOT in our system. Do they have a paper copy of test that I can sign and fax?   [name], are there any coupons or Pa for Amitiza or Linzess? Patient has IBS- Constiptation type. She has [employer].     Phone note:  Called patient and reviewed mychart messages with her.  Refill zocor and advised patient to stay on.  She Doesn't want to GI for IBS.  We will stay on toprol '100mg'$  for now as HR was 62 in office last week. We discussed CLOSELY monitoring.

## 2017-02-22 ENCOUNTER — Encounter: Payer: Self-pay | Admitting: Family

## 2017-02-23 NOTE — Telephone Encounter (Signed)
Cologuard is covered and order has been faxed.  Patient has also been made aware.

## 2017-02-28 ENCOUNTER — Encounter: Payer: Self-pay | Admitting: Family

## 2017-03-01 MED ORDER — LOSARTAN POTASSIUM 50 MG PO TABS: 50.0000 mg | ORAL_TABLET | Freq: Every day | ORAL | 3 refills | Status: DC

## 2017-03-01 MED ORDER — METOPROLOL SUCCINATE ER 100 MG PO TB24: 100.0000 mg | ORAL_TABLET | Freq: Every day | ORAL | 3 refills | Status: DC

## 2017-03-01 MED ORDER — TOPIRAMATE 50 MG PO TABS: 50.0000 mg | ORAL_TABLET | Freq: Two times a day (BID) | ORAL | 2 refills | Status: DC

## 2017-03-01 NOTE — Telephone Encounter (Signed)
Medication has been refilled.

## 2017-03-02 ENCOUNTER — Encounter: Payer: Self-pay | Admitting: Family

## 2017-03-02 DIAGNOSIS — Z1212 Encounter for screening for malignant neoplasm of rectum: Secondary | ICD-10-CM | POA: Diagnosis not present

## 2017-03-02 DIAGNOSIS — Z1211 Encounter for screening for malignant neoplasm of colon: Secondary | ICD-10-CM | POA: Diagnosis not present

## 2017-03-03 ENCOUNTER — Other Ambulatory Visit: Payer: Self-pay | Admitting: *Deleted

## 2017-03-03 MED ORDER — TOPIRAMATE 50 MG PO TABS: 50.0000 mg | ORAL_TABLET | Freq: Two times a day (BID) | ORAL | 2 refills | Status: DC

## 2017-03-03 MED ORDER — METOPROLOL SUCCINATE ER 100 MG PO TB24: 100.0000 mg | ORAL_TABLET | Freq: Every day | ORAL | 3 refills | Status: DC

## 2017-03-03 MED ORDER — LOSARTAN POTASSIUM 50 MG PO TABS: 50.0000 mg | ORAL_TABLET | Freq: Every day | ORAL | 3 refills | Status: DC

## 2017-03-06 ENCOUNTER — Encounter: Payer: Self-pay | Admitting: Family

## 2017-03-07 DIAGNOSIS — H353232 Exudative age-related macular degeneration, bilateral, with inactive choroidal neovascularization: Secondary | ICD-10-CM | POA: Diagnosis not present

## 2017-03-08 LAB — COLOGUARD: Cologuard: POSITIVE

## 2017-03-09 ENCOUNTER — Telehealth: Payer: Self-pay | Admitting: *Deleted

## 2017-03-09 DIAGNOSIS — R195 Other fecal abnormalities: Secondary | ICD-10-CM

## 2017-03-09 NOTE — Telephone Encounter (Signed)
Left message for patient to return call back.  

## 2017-03-09 NOTE — Telephone Encounter (Signed)
[facility] requested to know if the abnormal colon guard was received via fax.  Contact [phone]

## 2017-03-09 NOTE — Telephone Encounter (Signed)
Yes we have received FAX.

## 2017-03-09 NOTE — Telephone Encounter (Signed)
Call pt  Let her know that cologuard came back positive so we recommend a colonoscopy at this point  Would she be willing to have? Referral  placed

## 2017-03-13 NOTE — Telephone Encounter (Signed)
Please send my chart message

## 2017-03-13 NOTE — Telephone Encounter (Signed)
Mychart has been sent

## 2017-03-14 ENCOUNTER — Encounter: Payer: Self-pay | Admitting: Family

## 2017-03-14 ENCOUNTER — Encounter: Payer: Self-pay | Admitting: Gastroenterology

## 2017-03-15 ENCOUNTER — Other Ambulatory Visit: Payer: Self-pay

## 2017-03-15 ENCOUNTER — Encounter: Payer: Self-pay | Admitting: Family

## 2017-03-15 ENCOUNTER — Telehealth: Payer: Self-pay

## 2017-03-15 DIAGNOSIS — R195 Other fecal abnormalities: Secondary | ICD-10-CM

## 2017-03-15 NOTE — Telephone Encounter (Signed)
Gastroenterology Pre-Procedure Review  Request Date: Apr 04, 2017 Requesting Physician: Dr. Allen Norris  PATIENT REVIEW QUESTIONS: The patient responded to the following health history questions as indicated:    1. Are you having any GI issues? no 2. Do you have a personal history of Polyps? no 3. Do you have a family history of Colon Cancer or Polyps? yes (Mother and Grandfather Colon Cancer) 4. Diabetes Mellitus? no 5. Joint replacements in the past 12 months?no 6. Major health problems in the past 3 months?no 7. Any artificial heart valves, MVP, or defibrillator?no    MEDICATIONS & ALLERGIES:    Patient reports the following regarding taking any anticoagulation/antiplatelet therapy:   Plavix, Coumadin, Eliquis, Xarelto, Lovenox, Pradaxa, Brilinta, or Effient? no Aspirin? no  Patient confirms/reports the following medications:  Current Outpatient Prescriptions  Medication Sig Dispense Refill  . docusate sodium (COLACE) 100 MG capsule Take 100 mg by mouth daily.    . fluticasone (FLONASE) 50 MCG/ACT nasal spray Place 2 sprays into both nostrils daily. (Patient taking differently: Place 2 sprays into both nostrils daily as needed. ) 16 g 2  . losartan (COZAAR) 50 MG tablet Take 1 tablet (50 mg total) by mouth daily. 90 tablet 3  . metoprolol succinate (TOPROL-XL) 100 MG 24 hr tablet Take 1 tablet (100 mg total) by mouth daily. 90 tablet 3  . Multiple Vitamins-Minerals (MULTIVITAMIN WITH MINERALS) tablet Take 1 tablet by mouth daily.    . Multiple Vitamins-Minerals (PRESERVISION AREDS 2) CAPS Take 1 capsule by mouth 2 (two) times daily.    . Polyethylene Glycol 3350 (MIRALAX PO) Take by mouth daily as needed.    . simvastatin (ZOCOR) 20 MG tablet Take 1 tablet (20 mg total) by mouth every evening. 90 tablet 4  . topiramate (TOPAMAX) 50 MG tablet Take 1 tablet (50 mg total) by mouth 2 (two) times daily. 180 tablet 2   No current facility-administered medications for this visit.     Patient  confirms/reports the following allergies:  Allergies  Allergen Reactions  . Codeine     Nausea and vomiting    No orders of the defined types were placed in this encounter.   AUTHORIZATION INFORMATION Primary Insurance: 1D#: Group #:  Secondary Insurance: 1D#: Group #:  SCHEDULE INFORMATION: Date:  Time:May 22nd Dr. Charlesetta Ivory Location:

## 2017-03-17 ENCOUNTER — Telehealth: Payer: Self-pay | Admitting: Gastroenterology

## 2017-03-17 NOTE — Telephone Encounter (Signed)
03/17/17 Spoke with Bradly Chris at [facility] and NO prior auth required for Colonoscopy [id number] / R19.5 Reference #: [id number].

## 2017-03-21 ENCOUNTER — Encounter: Payer: Self-pay | Admitting: Gastroenterology

## 2017-03-22 ENCOUNTER — Telehealth: Payer: Self-pay | Admitting: Family

## 2017-03-22 NOTE — Telephone Encounter (Signed)
Pt called back and stated that she spoke with [facility]. She stated that her medication wound up being sent to [facility] and because [facility] had the rx [facility] cancelled the order. [employer] is expediting this and she will have her medication on Saturday.

## 2017-03-22 NOTE — Telephone Encounter (Signed)
Pt called and stated that she has not received her medication for losartan (COZAAR) 50 MG tablet, metoprolol succinate (TOPROL-XL) 100 MG 24 hr tablet, and topiramate (TOPAMAX) 50 MG tablet. Pt states that she is completely out of her losartan (COZAAR) 50 MG tablet. She would like [place] to get in touch with her pharmacy to see what is going on, and pt would like a call back. Please advise, thank you!  [facility] - [place], [place]  Call pt @ [phone]

## 2017-03-24 ENCOUNTER — Encounter: Payer: Self-pay | Admitting: Family

## 2017-03-27 ENCOUNTER — Encounter: Payer: Self-pay | Admitting: Family

## 2017-03-29 ENCOUNTER — Other Ambulatory Visit: Payer: Self-pay

## 2017-03-29 ENCOUNTER — Telehealth: Payer: Self-pay | Admitting: Gastroenterology

## 2017-03-29 DIAGNOSIS — Z1211 Encounter for screening for malignant neoplasm of colon: Secondary | ICD-10-CM

## 2017-03-29 DIAGNOSIS — Z1212 Encounter for screening for malignant neoplasm of rectum: Principal | ICD-10-CM

## 2017-03-29 MED ORDER — PEG 3350-KCL-NA BICARB-NACL 420 G PO SOLR: 4000.0000 mL | Freq: Once | ORAL | 0 refills | Status: AC

## 2017-03-29 NOTE — Telephone Encounter (Signed)
Patient has question regarding stopping medications before procedure. Please call

## 2017-03-30 ENCOUNTER — Telehealth: Payer: Self-pay | Admitting: Gastroenterology

## 2017-03-30 NOTE — Telephone Encounter (Signed)
Patient is waiting for a call back regarding some medication. Please call today.

## 2017-03-30 NOTE — Telephone Encounter (Signed)
Returned pts phone call to get additional info on medication she was waiting for, no answer I left a message for her to call office back.

## 2017-04-04 ENCOUNTER — Ambulatory Visit: Payer: Medicare HMO | Admitting: Anesthesiology

## 2017-04-04 ENCOUNTER — Encounter
Admission: RE | Disposition: A | Discharge status: Home / Self Care | Payer: Self-pay | Source: Ambulatory Visit | Attending: Gastroenterology

## 2017-04-04 ENCOUNTER — Encounter: Payer: Self-pay | Admitting: Family

## 2017-04-04 ENCOUNTER — Encounter: Payer: Self-pay | Admitting: Anesthesiology

## 2017-04-04 ENCOUNTER — Ambulatory Visit
Admission: RE | Admit: 2017-04-04 | Discharge: 2017-04-04 | Disposition: A | Discharge status: Home / Self Care | Payer: Medicare HMO | Source: Ambulatory Visit | Attending: Gastroenterology | Admitting: Gastroenterology

## 2017-04-04 DIAGNOSIS — Z833 Family history of diabetes mellitus: Secondary | ICD-10-CM | POA: Diagnosis not present

## 2017-04-04 DIAGNOSIS — Z8262 Family history of osteoporosis: Secondary | ICD-10-CM | POA: Insufficient documentation

## 2017-04-04 DIAGNOSIS — R195 Other fecal abnormalities: Secondary | ICD-10-CM

## 2017-04-04 DIAGNOSIS — Z8249 Family history of ischemic heart disease and other diseases of the circulatory system: Secondary | ICD-10-CM | POA: Diagnosis not present

## 2017-04-04 DIAGNOSIS — D490 Neoplasm of unspecified behavior of digestive system: Secondary | ICD-10-CM

## 2017-04-04 DIAGNOSIS — Z79899 Other long term (current) drug therapy: Secondary | ICD-10-CM | POA: Insufficient documentation

## 2017-04-04 DIAGNOSIS — Z8542 Personal history of malignant neoplasm of other parts of uterus: Secondary | ICD-10-CM | POA: Insufficient documentation

## 2017-04-04 DIAGNOSIS — K5730 Diverticulosis of large intestine without perforation or abscess without bleeding: Secondary | ICD-10-CM | POA: Diagnosis not present

## 2017-04-04 DIAGNOSIS — Z885 Allergy status to narcotic agent status: Secondary | ICD-10-CM | POA: Insufficient documentation

## 2017-04-04 DIAGNOSIS — E559 Vitamin D deficiency, unspecified: Secondary | ICD-10-CM | POA: Diagnosis not present

## 2017-04-04 DIAGNOSIS — C187 Malignant neoplasm of sigmoid colon: Secondary | ICD-10-CM | POA: Diagnosis not present

## 2017-04-04 DIAGNOSIS — M170 Bilateral primary osteoarthritis of knee: Secondary | ICD-10-CM | POA: Diagnosis not present

## 2017-04-04 DIAGNOSIS — I10 Essential (primary) hypertension: Secondary | ICD-10-CM | POA: Diagnosis not present

## 2017-04-04 DIAGNOSIS — K6389 Other specified diseases of intestine: Secondary | ICD-10-CM | POA: Diagnosis not present

## 2017-04-04 DIAGNOSIS — K921 Melena: Secondary | ICD-10-CM | POA: Diagnosis present

## 2017-04-04 DIAGNOSIS — Z803 Family history of malignant neoplasm of breast: Secondary | ICD-10-CM | POA: Insufficient documentation

## 2017-04-04 DIAGNOSIS — Z90710 Acquired absence of both cervix and uterus: Secondary | ICD-10-CM | POA: Insufficient documentation

## 2017-04-04 DIAGNOSIS — K5900 Constipation, unspecified: Secondary | ICD-10-CM | POA: Diagnosis not present

## 2017-04-04 DIAGNOSIS — M1990 Unspecified osteoarthritis, unspecified site: Secondary | ICD-10-CM | POA: Diagnosis not present

## 2017-04-04 DIAGNOSIS — Z800 Family history of malignant neoplasm of digestive organs: Secondary | ICD-10-CM | POA: Insufficient documentation

## 2017-04-04 DIAGNOSIS — Z818 Family history of other mental and behavioral disorders: Secondary | ICD-10-CM | POA: Insufficient documentation

## 2017-04-04 DIAGNOSIS — K589 Irritable bowel syndrome without diarrhea: Secondary | ICD-10-CM | POA: Diagnosis not present

## 2017-04-04 HISTORY — PX: COLONOSCOPY WITH PROPOFOL: SHX5780

## 2017-04-04 SURGERY — COLONOSCOPY WITH PROPOFOL
Anesthesia: General

## 2017-04-04 MED ORDER — PROPOFOL 500 MG/50ML IV EMUL
INTRAVENOUS | Status: DC | PRN
  Administered 2017-04-04: 150 ug/kg/min via INTRAVENOUS

## 2017-04-04 MED ORDER — DEXAMETHASONE SODIUM PHOSPHATE 10 MG/ML IJ SOLN
INTRAMUSCULAR | Status: DC | PRN
  Administered 2017-04-04: 8 mg via INTRAVENOUS

## 2017-04-04 MED ORDER — EPHEDRINE SULFATE 50 MG/ML IJ SOLN
INTRAMUSCULAR | Status: DC | PRN
  Administered 2017-04-04: 10 mg via INTRAVENOUS

## 2017-04-04 MED ORDER — PHENYLEPHRINE HCL 10 MG/ML IJ SOLN
INTRAMUSCULAR | Status: DC | PRN
  Administered 2017-04-04: 100 ug via INTRAVENOUS

## 2017-04-04 MED ORDER — SODIUM CHLORIDE 0.9 % IV SOLN
INTRAVENOUS | Status: DC
  Administered 2017-04-04 (×2): via INTRAVENOUS

## 2017-04-04 MED ORDER — PROPOFOL 500 MG/50ML IV EMUL
INTRAVENOUS | Status: AC
  Filled 2017-04-04: qty 50

## 2017-04-04 MED ORDER — PROPOFOL 10 MG/ML IV BOLUS
INTRAVENOUS | Status: AC
  Filled 2017-04-04: qty 20

## 2017-04-04 MED ORDER — FENTANYL CITRATE (PF) 100 MCG/2ML IJ SOLN
INTRAMUSCULAR | Status: AC
Start: 2017-04-04 — End: 2017-04-04
  Filled 2017-04-04: qty 2

## 2017-04-04 MED ORDER — FENTANYL CITRATE (PF) 100 MCG/2ML IJ SOLN
INTRAMUSCULAR | Status: DC | PRN
  Administered 2017-04-04: 50 ug via INTRAVENOUS

## 2017-04-04 MED ORDER — PROPOFOL 10 MG/ML IV BOLUS
INTRAVENOUS | Status: DC | PRN
  Administered 2017-04-04: 50 mg via INTRAVENOUS

## 2017-04-04 NOTE — Anesthesia Post-op Follow-up Note (Cosign Needed)
Anesthesia QCDR form completed.        

## 2017-04-04 NOTE — Anesthesia Postprocedure Evaluation (Signed)
Anesthesia Post Note  Patient: [name]  Procedure(s) Performed: Procedure(s) (LRB): COLONOSCOPY WITH PROPOFOL (N/A)  Patient location during evaluation: Endoscopy Anesthesia Type: General Level of consciousness: awake and alert Pain management: pain level controlled Vital Signs Assessment: post-procedure vital signs reviewed and stable Respiratory status: spontaneous breathing, nonlabored ventilation, respiratory function stable and patient connected to nasal cannula oxygen Cardiovascular status: blood pressure returned to baseline and stable Postop Assessment: no signs of nausea or vomiting Anesthetic complications: no     Last Vitals:  Vitals:   04/04/17 1210 04/04/17 1224  BP: (!) 142/68   Pulse:    Resp:    Temp: (!) 35.6 C (!) 35.6 C    Last Pain:  Vitals:   04/04/17 1224  TempSrc: Tympanic                 Kennth Vanbenschoten S

## 2017-04-04 NOTE — Transfer of Care (Signed)
Immediate Anesthesia Transfer of Care Note  Patient: [name]  Procedure(s) Performed: Procedure(s): COLONOSCOPY WITH PROPOFOL (N/A)  Patient Location: PACU  Anesthesia Type:General  Level of Consciousness: sedated  Airway & Oxygen Therapy: Patient Spontanous Breathing and Patient connected to nasal cannula oxygen  Post-op Assessment: Report given to RN and Post -op Vital signs reviewed and stable  Post vital signs: Reviewed and stable  Last Vitals:  Vitals:   04/04/17 0934 04/04/17 1140  BP: (!) 175/74 (!) 99/42  Pulse: 77 68  Resp: (!) 22 (!) 21  Temp: 36.5 C (!) (P) 36 C    Last Pain:  Vitals:   04/04/17 0934  TempSrc: Tympanic         Complications: No apparent anesthesia complications

## 2017-04-04 NOTE — Anesthesia Preprocedure Evaluation (Signed)
Anesthesia Evaluation  Patient identified by MRN, date of birth, ID band Patient awake    Reviewed: Allergy & Precautions, NPO status , Patient's Chart, lab work & pertinent test results, reviewed documented beta blocker date and time   Airway Mallampati: II  TM Distance: >3 FB     Dental  (+) Chipped, Poor Dentition, Missing   Pulmonary           Cardiovascular hypertension,      Neuro/Psych    GI/Hepatic   Endo/Other    Renal/GU      Musculoskeletal  (+) Arthritis ,   Abdominal   Peds  Hematology   Anesthesia Other Findings   Reproductive/Obstetrics                             Anesthesia Physical Anesthesia Plan  ASA: III  Anesthesia Plan: General   Post-op Pain Management:    Induction: Intravenous  Airway Management Planned:   Additional Equipment:   Intra-op Plan:   Post-operative Plan:   Informed Consent: I have reviewed the patients History and Physical, chart, labs and discussed the procedure including the risks, benefits and alternatives for the proposed anesthesia with the patient or authorized representative who has indicated his/her understanding and acceptance.     Plan Discussed with: CRNA  Anesthesia Plan Comments:         Anesthesia Quick Evaluation

## 2017-04-04 NOTE — H&P (Signed)
Mandy Lame, MD [address] [name], [place] [address] Phone:[phone] Fax : [phone]  Primary Care Physician:  Mandy Hawthorne, FNP Primary Gastroenterologist:  Dr. Allen Wyatt  Pre-Procedure History & Physical: HPI:  [name] is a 76 y.o. female is here for an colonoscopy.   Past Medical History:  Diagnosis Date  . Arthritis    bitateral knees and left foot,s/p cortisone injection in past  . Chronic constipation   . Endometrial cancer ([place])   . Hypertension   . IBS (irritable bowel syndrome)   . Menopause    age 68  . Vitamin D deficiency 06/07/10    Past Surgical History:  Procedure Laterality Date  . ABDOMINAL HYSTERECTOMY    . BUNIONECTOMY    . DILATION AND CURETTAGE OF UTERUS  1971  . FOOT SURGERY    . VAGINAL DELIVERY      Prior to Admission medications   Medication Sig Start Date End Date Taking? Authorizing Provider  docusate sodium (COLACE) 100 MG capsule Take 100 mg by mouth daily.   Yes [provider]  fluticasone (FLONASE) 50 MCG/ACT nasal spray Place 2 sprays into both nostrils daily. Patient taking differently: Place 2 sprays into both nostrils daily as needed.  08/08/16  Yes [name], Mandy Coder, FNP  losartan (COZAAR) 50 MG tablet Take 1 tablet (50 mg total) by mouth daily. 03/03/17  Yes Mandy Hawthorne, FNP  metoprolol succinate (TOPROL-XL) 100 MG 24 hr tablet Take 1 tablet (100 mg total) by mouth daily. 03/03/17  Yes Mandy Hawthorne, FNP  Polyethylene Glycol 3350 (MIRALAX PO) Take by mouth daily as needed.   Yes [provider]  simvastatin (ZOCOR) 20 MG tablet Take 1 tablet (20 mg total) by mouth every evening. 02/21/17  Yes Mandy Hawthorne, FNP  topiramate (TOPAMAX) 50 MG tablet Take 1 tablet (50 mg total) by mouth 2 (two) times daily. 03/03/17  Yes [name], Mandy Coder, FNP  Multiple Vitamins-Minerals (MULTIVITAMIN WITH MINERALS) tablet Take 1 tablet by mouth daily.    [provider]  Multiple  Vitamins-Minerals (PRESERVISION AREDS 2) CAPS Take 1 capsule by mouth 2 (two) times daily.    [provider]  penicillin v potassium (VEETID) 500 MG tablet  03/03/17   [provider]    Allergies as of 03/15/2017 - Review Complete 02/14/2017  Allergen Reaction Noted  . Codeine  07/08/2011    Family History  Problem Relation Age of Onset  . Heart disease Father 42       massive MI  . Diabetes Father   . Cancer Sister        breast  . Bipolar disorder Sister   . Osteoporosis Sister   . Breast cancer Sister 73  . Bipolar disorder Maternal Aunt   . Breast cancer Maternal Aunt   . Cancer Maternal Grandfather        colon cancer  . Colon cancer Maternal Grandfather 10  . Osteoporosis Mother   . Colon cancer Mother     Social History   Social History  . Marital status: Divorced    Spouse name: N/A  . Number of children: N/A  . Years of education: N/A   Occupational History  . Not on file.   Social History Main Topics  . Smoking status: Never Smoker  . Smokeless tobacco: Never Used  . Alcohol use Yes     Comment: socially/rarely  . Drug use: No  . Sexual activity: No  Other Topics Concern  . Not on file   Social History Narrative   Exercise- aerobic exercise at home 3-4x week.       Lives in [place]. No pets.    Review of Systems: See HPI, otherwise negative ROS  Physical Exam: BP (!) 175/74   Pulse 77   Temp 97.7 F (36.5 C) (Tympanic)   Resp (!) 22   Ht 5\' 5"  (1.651 m)   Wt 220 lb (99.8 kg)   SpO2 94%   BMI 36.61 kg/m  General:   Alert,  pleasant and cooperative in NAD Head:  Normocephalic and atraumatic. Neck:  Supple; no masses or thyromegaly. Lungs:  Clear throughout to auscultation.    Heart:  Regular rate and rhythm. Abdomen:  Soft, nontender and nondistended. Normal bowel sounds, without guarding, and without rebound.   Neurologic:  Alert and  oriented x4;  grossly normal neurologically.  Impression/Plan: [name] is here for an colonoscopy to be performed for heme positive stools  Risks, benefits, limitations, and alternatives regarding  colonoscopy have been reviewed with the patient.  Questions have been answered.  All parties agreeable.   Mandy Lame, MD  04/04/2017, 10:31 AM

## 2017-04-04 NOTE — Anesthesia Procedure Notes (Signed)
Date/Time: 04/04/2017 11:10 AM Performed by: Allean Found Pre-anesthesia Checklist: Patient identified, Emergency Drugs available, Suction available, Patient being monitored and Timeout performed Patient Re-evaluated:Patient Re-evaluated prior to inductionOxygen Delivery Method: Nasal cannula Placement Confirmation: positive ETCO2

## 2017-04-04 NOTE — Op Note (Signed)
[facility] Gastroenterology Patient Name: [name] Procedure Date: 04/04/2017 11:02 AM MRN: [id number] Account #: [id number] Date of Birth: May 25, 1941 Admit Type: Outpatient Age: 76 Room: [facility] ENDO ROOM 4 Gender: Female Note Status: Finalized Procedure:            Colonoscopy Indications:          Heme positive stool Providers:            [name] MD, MD Referring MD:         Yvetta Coder. Arnett (Referring MD) Medicines:            Propofol per Anesthesia Complications:        No immediate complications. Procedure:            Pre-Anesthesia Assessment:                       - Prior to the procedure, a History and Physical was                        performed, and patient medications and allergies were                        reviewed. The patient's tolerance of previous                        anesthesia was also reviewed. The risks and benefits of                        the procedure and the sedation options and risks were                        discussed with the patient. All questions were                        answered, and informed consent was obtained. Prior                        Anticoagulants: The patient has taken no previous                        anticoagulant or antiplatelet agents. ASA Grade                        Assessment: II - A patient with mild systemic disease.                        After reviewing the risks and benefits, the patient was                        deemed in satisfactory condition to undergo the                        procedure.                       After obtaining informed consent, the colonoscope was                        passed under direct vision. Throughout the procedure,  the patient's blood pressure, pulse, and oxygen                        saturations were monitored continuously. The                        Colonoscope was introduced through the anus and                        advanced to the  the cecum, identified by appendiceal                        orifice and ileocecal valve. The colonoscopy was                        performed without difficulty. The patient tolerated the                        procedure well. The quality of the bowel preparation                        was excellent. Findings:      The perianal and digital rectal examinations were normal.      A fungating partially obstructing large mass was found in the sigmoid       colon. The mass was non-circumferential. The mass measured seven cm in       length. Oozing was present. This was biopsied with a cold forceps for       histology. Area was tattooed with an injection of Spot (carbon black).      Sub mucosal sesion in the sigmoid that was marked with a tattoo. The       center of the lesion had what appeared to have a divertilculum at the       center.      Multiple small-mouthed diverticula were found in the sigmoid colon and       descending colon. Impression:           - Likely malignant partially obstructing tumor in the                        sigmoid colon. Biopsied. Tattooed. located at 20cm from                        anus.                       - Sub mucosal sesion in the sigmoid that was marked                        with a tattoo. The center of the lesion had what                        appeared to have a divertilculum at the center.                       - Diverticulosis in the sigmoid colon and in the                        descending colon. Recommendation:       - Discharge patient to home.                       -  Resume previous diet.                       - Continue present medications.                       - Await pathology results.                       - Refer to a Psychologist, sport and exercise. Procedure Code(s):    --- Professional ---                       [phone], Colonoscopy, flexible; with directed submucosal                        injection(s), any substance                       38250, Colonoscopy,  flexible; with biopsy, single or                        multiple Diagnosis Code(s):    --- Professional ---                       R19.5, Other fecal abnormalities                       D49.0, Neoplasm of unspecified behavior of digestive                        system CPT copyright 2016 American Medical Association. All rights reserved. The codes documented in this report are preliminary and upon coder review may  be revised to meet current compliance requirements. [name] MD, MD 04/04/2017 11:38:58 AM This report has been signed electronically. Number of Addenda: 0 Note Initiated On: 04/04/2017 11:02 AM Scope Withdrawal Time: 0 hours 12 minutes 42 seconds  Total Procedure Duration: 0 hours 22 minutes 35 seconds       [facility]

## 2017-04-05 ENCOUNTER — Other Ambulatory Visit: Payer: Self-pay

## 2017-04-05 ENCOUNTER — Other Ambulatory Visit
Admission: RE | Admit: 2017-04-05 | Discharge: 2017-04-05 | Disposition: A | Discharge status: Home / Self Care | Payer: Medicare HMO | Source: Ambulatory Visit | Attending: Surgery | Admitting: Surgery

## 2017-04-05 ENCOUNTER — Telehealth: Payer: Self-pay

## 2017-04-05 ENCOUNTER — Other Ambulatory Visit: Payer: Self-pay | Admitting: Pathology

## 2017-04-05 ENCOUNTER — Encounter: Payer: Self-pay | Admitting: Family

## 2017-04-05 ENCOUNTER — Ambulatory Visit (INDEPENDENT_AMBULATORY_CARE_PROVIDER_SITE_OTHER): Payer: Medicare HMO | Admitting: Surgery

## 2017-04-05 ENCOUNTER — Encounter: Payer: Self-pay | Admitting: Surgery

## 2017-04-05 VITALS — BP 151/91 | HR 65 | Temp 97.4°F | Ht 65.0 in | Wt 220.0 lb

## 2017-04-05 DIAGNOSIS — C187 Malignant neoplasm of sigmoid colon: Secondary | ICD-10-CM

## 2017-04-05 LAB — COMPREHENSIVE METABOLIC PANEL
ALK PHOS: 100 U/L (ref 38–126)
ALT: 16 U/L (ref 14–54)
AST: 30 U/L (ref 15–41)
Albumin: 3.8 g/dL (ref 3.5–5.0)
Anion gap: 7 (ref 5–15)
BILIRUBIN TOTAL: 0.7 mg/dL (ref 0.3–1.2)
BUN: 11 mg/dL (ref 6–20)
CALCIUM: 9.2 mg/dL (ref 8.9–10.3)
CO2: 20 mmol/L — ABNORMAL LOW (ref 22–32)
CREATININE: 0.88 mg/dL (ref 0.44–1.00)
Chloride: 113 mmol/L — ABNORMAL HIGH (ref 101–111)
Glucose, Bld: 91 mg/dL (ref 65–99)
Potassium: 3.6 mmol/L (ref 3.5–5.1)
Sodium: 140 mmol/L (ref 135–145)
Total Protein: 7.1 g/dL (ref 6.5–8.1)

## 2017-04-05 LAB — CBC WITH DIFFERENTIAL/PLATELET
Basophils Absolute: 0.1 10*3/uL (ref 0–0.1)
Basophils Relative: 1 %
EOS PCT: 2 %
Eosinophils Absolute: 0.2 10*3/uL (ref 0–0.7)
HEMATOCRIT: 36.3 % (ref 35.0–47.0)
HEMOGLOBIN: 12.4 g/dL (ref 12.0–16.0)
LYMPHS ABS: 2.8 10*3/uL (ref 1.0–3.6)
LYMPHS PCT: 29 %
MCH: 29.1 pg (ref 26.0–34.0)
MCHC: 34.1 g/dL (ref 32.0–36.0)
MCV: 85.4 fL (ref 80.0–100.0)
Monocytes Absolute: 0.7 10*3/uL (ref 0.2–0.9)
Monocytes Relative: 7 %
NEUTROS ABS: 5.8 10*3/uL (ref 1.4–6.5)
NEUTROS PCT: 61 %
Platelets: 241 10*3/uL (ref 150–440)
RBC: 4.25 MIL/uL (ref 3.80–5.20)
RDW: 14.6 % — ABNORMAL HIGH (ref 11.5–14.5)
WBC: 9.4 10*3/uL (ref 3.6–11.0)

## 2017-04-05 LAB — SURGICAL PATHOLOGY

## 2017-04-05 NOTE — Progress Notes (Signed)
Surgical Consultation  04/05/2017  [name] is an 76 y.o. female.   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 Is been tattooed. This was found on routine screening exam  Cold guard was positive. She had a colonoscopy 9 years ago which was normal. She's had no melena or hematochezia.  Of significance is that she has had endometrial cancer which necessitated a hand-assisted laparoscopic hysterectomy but she also had radiotherapy and she states he only had 5 treatments.  There is a strong family history of colon cancer.  Past Medical History:  Diagnosis Date  . Arthritis    bitateral knees and left foot,s/p cortisone injection in past  . Chronic constipation   . Endometrial cancer ([place])   . Hypertension   . IBS (irritable bowel syndrome)   . Menopause    age 18  . Vitamin D deficiency 06/07/10    Past Surgical History:  Procedure Laterality Date  . BUNIONECTOMY    . DILATION AND CURETTAGE OF UTERUS  1971  . FOOT SURGERY    . VAGINAL DELIVERY    . VAGINAL HYSTERECTOMY      Family History  Problem Relation Age of Onset  . Heart disease Father 54       massive MI  . Diabetes Father   . Cancer Sister        breast  . Bipolar disorder Sister   . Osteoporosis Sister   . Breast cancer Sister 65  . Bipolar disorder Maternal Aunt   . Breast cancer Maternal Aunt   . Cancer Maternal Grandfather        colon cancer  . Colon cancer Maternal Grandfather 40  . Osteoporosis Mother   . Colon cancer Mother     Social History:  reports that she has never smoked. She has never used smokeless tobacco. She reports that she drinks alcohol. She reports that she does not use drugs.  Allergies:  Allergies  Allergen Reactions  . Codeine Nausea And Vomiting    Nausea and vomiting    Medications reviewed.   Review of Systems:   Review of Systems   Constitutional: Negative.   HENT: Negative.   Eyes: Negative.   Respiratory: Negative.   Cardiovascular: Negative.   Gastrointestinal: Negative for abdominal pain, constipation, diarrhea, heartburn, nausea and vomiting.  Genitourinary: Negative.   Musculoskeletal: Negative.   Skin: Negative.   Neurological: Negative.   Endo/Heme/Allergies: Negative.   Psychiatric/Behavioral: Negative.      Physical Exam:  BP (!) 151/91   Pulse 65   Temp 97.4 F (36.3 C) (Oral)   Ht 5\' 5"  (1.651 m)   Wt 220 lb (99.8 kg)   BMI 36.61 kg/m   Physical Exam  Constitutional: She is oriented to person, place, and time and well-developed, well-nourished, and in no distress. No distress.  Obese no acute distress  HENT:  Head: Normocephalic and atraumatic.  Eyes: Pupils are equal, round, and reactive to light. Right eye exhibits no discharge. Left eye exhibits no discharge. No scleral icterus.  Neck: Normal range of motion.  Cardiovascular: Normal rate, regular rhythm and normal heart sounds.   Pulmonary/Chest: Effort normal and breath sounds normal. No respiratory distress. She has no wheezes. She has no rales.  Abdominal: Soft. She exhibits no distension. There is no tenderness. There is no rebound and no guarding.  Supraumbilical midline incision with questionable  hernia.  Musculoskeletal: Normal range of motion. She exhibits edema. She exhibits no tenderness.  Considerable edema both lower extremities  Lymphadenopathy:    She has no cervical adenopathy.  Neurological: She is alert and oriented to person, place, and time.  Skin: Skin is warm and dry. No rash noted. She is not diaphoretic. No erythema.  Psychiatric: Mood and affect normal.  Vitals reviewed.     Results for orders placed or performed during the hospital encounter of 04/04/17 (from the past 48 hour(s))  Surgical pathology     Status: None   Collection Time: 04/04/17 11:32 AM  Result Value Ref Range   SURGICAL PATHOLOGY       Surgical Pathology CASE: [id number] PATIENT: [name] Pathology Report     SPECIMEN SUBMITTED: A. Colon mass, sigmoid; cbx  CLINICAL HISTORY: None provided  PRE-OPERATIVE DIAGNOSIS: Z19.5 abnormal stool test  POST-OPERATIVE DIAGNOSIS: Sigmoid colon mass     DIAGNOSIS: A. COLON MASS, SIGMOID; COLD BIOPSY: - ADENOCARCINOMA.  Note: Tumor appears poorly differentiated in this material.   GROSS DESCRIPTION:  A. Labeled: C BX sigmoid colon mass  Tissue fragment(s): 2  Size: 0.3 and 0.4 cm  Description: tan fragments  Entirely submitted in 1 cassette(s).    Final Diagnosis performed by Delorse Lek, MD.  Electronically signed 04/05/2017 1:18:00PM    The electronic signature indicates that the named Attending Pathologist has evaluated the specimen  Technical component performed at [facility], [address] [place], [place] [address] Lab: [phone] Dir: Darrick Penna. Evette Doffing, MD  Professional component performed at [facility], [facility], [place], [place], [place] [address] Lab: [phone] Dir: Dellia Nims. Rubinas, MD     No results found.  Assessment/Plan:  Pathology shows adenocarcinoma. This is a process we 7 cm in the sigmoid colon approximately 20 cm from the anal verge and is been tattooed. Patient has no further workup in place. I would like to obtain CBC CMP CEA and abdominal pelvic CT scan. My concern is that for her radiation that she was given previously for endometrial cancer. She will require a low anterior resection or sigmoid colon resection depending on the results. Because of her considerable edema of the lower extremities I would like to obtain medical clearance as well.   Patient will be seen back in the office following the CT scan as well as primary care medical clearance. She has no primary care physician and we will attempt to arrange for that I would also review her studies at that time. This was  discussed with her she understood and agreed with this plan Florene Glen, MD, FACS

## 2017-04-05 NOTE — Telephone Encounter (Signed)
Called central scheduling at this time to schedule CAT Scan for patient. Was told her appointment would be tomorrow at 10AM at [facility].  [facility] was [facility] to have her labs done. I went to tell patient that she has a CAT Scan scheduled for tomorrow and to stop by Radiology to get her prep kit after getting her labs drawn. 

## 2017-04-05 NOTE — Patient Instructions (Signed)
We would like for you to have a CT Scan.  We will call you with that appointment tomorrow.   We would like for you to stop by the lab today.  Please see your follow up appointment listed below.

## 2017-04-05 NOTE — Addendum Note (Signed)
Addended by: Lowella Curb on: 04/05/2017 04:10 PM   Modules accepted: Orders

## 2017-04-06 ENCOUNTER — Encounter: Payer: Self-pay | Admitting: Gastroenterology

## 2017-04-06 ENCOUNTER — Telehealth: Payer: Self-pay

## 2017-04-06 LAB — CEA: CEA: 1.8 ng/mL (ref 0.0–4.7)

## 2017-04-06 NOTE — Telephone Encounter (Signed)
Medical Clearance faxed to Dr.Margaret Arnett at this time.   Notified patient of appointment with Dr.Margaret Arnett on 04/11/17 @ 11:00 am for Medical Clearance.  Patient verbalized understanding.

## 2017-04-11 ENCOUNTER — Encounter: Payer: Self-pay | Admitting: Family

## 2017-04-11 ENCOUNTER — Ambulatory Visit (INDEPENDENT_AMBULATORY_CARE_PROVIDER_SITE_OTHER): Payer: Medicare HMO | Admitting: Family

## 2017-04-11 VITALS — BP 135/65 | HR 66 | Temp 98.0°F | Ht 65.0 in

## 2017-04-11 DIAGNOSIS — D490 Neoplasm of unspecified behavior of digestive system: Secondary | ICD-10-CM

## 2017-04-11 DIAGNOSIS — I10 Essential (primary) hypertension: Secondary | ICD-10-CM | POA: Diagnosis not present

## 2017-04-11 NOTE — Patient Instructions (Signed)
Continue to think about you  From my standpoint, you are low risk for CT abdomen tomorrow and surgery

## 2017-04-11 NOTE — Progress Notes (Signed)
Subjective:    Patient ID: [name], female    DOB: [birth year], 76 y.o.   MRN: [id number]  CC: [name] is a 76 y.o. female who presents today for follow up.   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 CT abdomen tomorrow and 'needs clearance'. Not sure for what reason. Surgery TBD with Dr Burt Knack.   HTN- compliant with medication., on cozaar. Thinks stress has risen blood pressure past couple of weeks. Not checking at home. Denies exertional chest pain or pressure, numbness or tingling radiating to left arm or jaw, palpitations, dizziness, frequent headaches, changes in vision, or shortness of breath.  No CP with exercise. Has never seen cardiology.   No h/o CKD.     HISTORY:  Past Medical History:  Diagnosis Date  . Arthritis    bitateral knees and left foot,s/p cortisone injection in past  . Chronic constipation   . Endometrial cancer ([place])   . Hypertension   . IBS (irritable bowel syndrome)   . Menopause    age 53  . Vitamin D deficiency 06/07/10   Past Surgical History:  Procedure Laterality Date  . BUNIONECTOMY    . COLONOSCOPY WITH PROPOFOL N/A 04/04/2017   Procedure: COLONOSCOPY WITH PROPOFOL;  Surgeon: [name], MD;  Location: [facility];  Service: Endoscopy;  Laterality: N/A;  . DILATION AND CURETTAGE OF UTERUS  1971  . FOOT SURGERY    . VAGINAL DELIVERY    . VAGINAL HYSTERECTOMY     Family History  Problem Relation Age of Onset  . Heart disease Father 30       massive MI  . Diabetes Father   . Cancer Sister        breast  . Bipolar disorder Sister   . Osteoporosis Sister   . Breast cancer Sister 40  . Bipolar disorder Maternal Aunt   . Breast cancer Maternal Aunt   . Cancer Maternal Grandfather        colon cancer  . Colon cancer Maternal Grandfather 109  . Osteoporosis Mother   . Colon cancer Mother     Allergies: Codeine Current Outpatient Prescriptions on File Prior to Visit  Medication Sig Dispense Refill  . docusate sodium  (COLACE) 100 MG capsule Take 100 mg by mouth daily.    . fluticasone (FLONASE) 50 MCG/ACT nasal spray Place 2 sprays into both nostrils daily. (Patient taking differently: Place 2 sprays into both nostrils daily as needed. ) 16 g 2  . losartan (COZAAR) 50 MG tablet Take 1 tablet (50 mg total) by mouth daily. 90 tablet 3  . metoprolol succinate (TOPROL-XL) 100 MG 24 hr tablet Take 1 tablet (100 mg total) by mouth daily. 90 tablet 3  . Multiple Vitamins-Minerals (PRESERVISION AREDS 2) CAPS Take 1 capsule by mouth 2 (two) times daily.    . Polyethylene Glycol 3350 (MIRALAX PO) Take by mouth daily as needed.    . simvastatin (ZOCOR) 20 MG tablet Take 1 tablet (20 mg total) by mouth every evening. 90 tablet 4  . topiramate (TOPAMAX) 50 MG tablet Take 1 tablet (50 mg total) by mouth 2 (two) times daily. (Patient taking differently: Take 100 mg by mouth 2 (two) times daily. ) 180 tablet 2   No current facility-administered medications on file prior to visit.     Social History  Substance Use Topics  . Smoking status: Never Smoker  . Smokeless tobacco: Never Used  . Alcohol use Yes     Comment:  socially/rarely    Review of Systems  Constitutional: Negative for chills and fever.  Eyes: Negative for visual disturbance.  Respiratory: Negative for cough.   Cardiovascular: Negative for chest pain and palpitations.  Gastrointestinal: Negative for nausea and vomiting.  Neurological: Negative for headaches.      Objective:    BP 135/65   Pulse 66   Temp 98 F (36.7 C) (Oral)   Ht 5\' 5"  (1.651 m)   SpO2 97%  BP Readings from Last 3 Encounters:  04/11/17 135/65  04/05/17 (!) 151/91  04/04/17 (!) 133/98   Wt Readings from Last 3 Encounters:  04/05/17 220 lb (99.8 kg)  04/04/17 220 lb (99.8 kg)  01/25/17 235 lb 10.8 oz (106.9 kg)    Physical Exam  Constitutional: She appears well-developed and well-nourished.  Eyes: Conjunctivae are normal.  Cardiovascular: Normal rate, regular rhythm,  normal heart sounds and normal pulses.   Pulmonary/Chest: Effort normal and breath sounds normal. She has no wheezes. She has no rhonchi. She has no rales.  Neurological: She is alert.  Skin: Skin is warm and dry.  Psychiatric: She has a normal mood and affect. Her speech is normal and behavior is normal. Thought content normal.  Vitals reviewed.      Assessment & Plan:   Problem List Items Addressed This Visit      Cardiovascular and Mediastinum   Essential hypertension, benign - Primary    Blood pressure at goal. Discussed with patient will continue current regimen. In context of increased stress of late, advised patient to monitor blood pressure at home and if elevates, she will let me know so we can address prior to surgery. Reassured by normal EKG also compared this to prior EKG in 2015. No significant changes. No acute ischemia.  sinus rhythm.      Relevant Orders   EKG 12-Lead (Completed)     Digestive   Neoplasm of digestive system    Completed form for preop clearance. Advised patient that I assess her as low cardiovascular risk and that she may proceed with surgery, CT abdomen.          I am having [name] maintain her docusate sodium, Polyethylene Glycol 3350 (MIRALAX PO), fluticasone, PRESERVISION AREDS 2, simvastatin, losartan, metoprolol succinate, and topiramate.   No orders of the defined types were placed in this encounter.   Return precautions given.   Risks, benefits, and alternatives of the medications and treatment plan prescribed today were discussed, and patient expressed understanding.   Education regarding symptom management and diagnosis given to patient on AVS.  Continue to follow with Burnard Hawthorne, FNP for routine health maintenance.   [name] and I agreed with plan.   [name], FNP

## 2017-04-11 NOTE — Assessment & Plan Note (Signed)
Completed form for preop clearance. Advised patient that I assess her as low cardiovascular risk and that she may proceed with surgery, CT abdomen.

## 2017-04-11 NOTE — Progress Notes (Signed)
Pre visit review using our clinic review tool, if applicable. No additional management support is needed unless otherwise documented below in the visit note. 

## 2017-04-11 NOTE — Assessment & Plan Note (Signed)
Blood pressure at goal. Discussed with patient will continue current regimen. In context of increased stress of late, advised patient to monitor blood pressure at home and if elevates, she will let me know so we can address prior to surgery. Reassured by normal EKG also compared this to prior EKG in 2015. No significant changes. No acute ischemia.  sinus rhythm.

## 2017-04-12 ENCOUNTER — Telehealth: Payer: Self-pay

## 2017-04-12 ENCOUNTER — Ambulatory Visit
Admission: RE | Admit: 2017-04-12 | Discharge: 2017-04-12 | Disposition: A | Discharge status: Home / Self Care | Payer: Medicare HMO | Source: Ambulatory Visit | Attending: Surgery | Admitting: Surgery

## 2017-04-12 DIAGNOSIS — I700 Atherosclerosis of aorta: Secondary | ICD-10-CM | POA: Insufficient documentation

## 2017-04-12 DIAGNOSIS — C187 Malignant neoplasm of sigmoid colon: Secondary | ICD-10-CM | POA: Diagnosis not present

## 2017-04-12 DIAGNOSIS — K439 Ventral hernia without obstruction or gangrene: Secondary | ICD-10-CM | POA: Diagnosis not present

## 2017-04-12 DIAGNOSIS — K449 Diaphragmatic hernia without obstruction or gangrene: Secondary | ICD-10-CM | POA: Insufficient documentation

## 2017-04-12 DIAGNOSIS — I708 Atherosclerosis of other arteries: Secondary | ICD-10-CM | POA: Insufficient documentation

## 2017-04-12 DIAGNOSIS — I2510 Atherosclerotic heart disease of native coronary artery without angina pectoris: Secondary | ICD-10-CM | POA: Insufficient documentation

## 2017-04-12 DIAGNOSIS — K6389 Other specified diseases of intestine: Secondary | ICD-10-CM | POA: Diagnosis not present

## 2017-04-12 MED ORDER — IOPAMIDOL (ISOVUE-300) INJECTION 61%
100.0000 mL | Freq: Once | INTRAVENOUS | Status: AC | PRN
  Administered 2017-04-12: 100 mL via INTRAVENOUS

## 2017-04-12 NOTE — Telephone Encounter (Signed)
Medical Clearance obtained from DR.[name] at this time and will be scanned under Media.

## 2017-04-12 NOTE — Telephone Encounter (Signed)
CT Scan results called in by [facility] in Radiology. Discussed with Dr. Adonis Huguenin immediately. Labs also completed. Patient moved up to 04/17/17 per Dr. Adonis Huguenin.  Patient was called to give new appointment information. No answer. Left voicemail for return phone call.  If patient calls back, we have obtained her clearance information, all labs, and CT scan- we have moved up patient's appointment due to this.

## 2017-04-17 ENCOUNTER — Telehealth: Payer: Self-pay | Admitting: *Deleted

## 2017-04-17 ENCOUNTER — Encounter: Payer: Self-pay | Admitting: Family

## 2017-04-17 ENCOUNTER — Encounter: Payer: Self-pay | Admitting: Surgery

## 2017-04-17 ENCOUNTER — Ambulatory Visit (INDEPENDENT_AMBULATORY_CARE_PROVIDER_SITE_OTHER): Payer: Medicare HMO | Admitting: Surgery

## 2017-04-17 VITALS — BP 172/79 | HR 75 | Temp 97.6°F | Ht 65.0 in | Wt 230.0 lb

## 2017-04-17 DIAGNOSIS — C187 Malignant neoplasm of sigmoid colon: Secondary | ICD-10-CM | POA: Diagnosis not present

## 2017-04-17 DIAGNOSIS — Z1211 Encounter for screening for malignant neoplasm of colon: Secondary | ICD-10-CM

## 2017-04-17 NOTE — Telephone Encounter (Signed)
Patient received a denied authorization for her colonoscopy, due to incorrect dates.  Patient requested to have a new requested sent in to [facility] with a revised date of 04/04/17.  Pt contact   [phone]

## 2017-04-17 NOTE — Progress Notes (Signed)
Outpatient Surgical Follow Up  04/17/2017  [name] is an 76 y.o. female.   HL:KTGYB cancer  HPI:sig colon cancer. Patient has had a thorough workup with a CT scan and lab results. Currently she has no abdominal pain and has had some alternating diarrhea and constipation. But she attributes that to her IBS which is not uncommon for her No nausea vomiting fevers or chills no weight loss. Past Medical History:  Diagnosis Date  . Arthritis    bitateral knees and left foot,s/p cortisone injection in past  . Chronic constipation   . Endometrial cancer ([place]) 2014   Total Hysterectomy and Rad tx's.   Marland Kitchen Hypertension   . IBS (irritable bowel syndrome)   . Menopause    age 73  . Vitamin D deficiency 06/07/10    Past Surgical History:  Procedure Laterality Date  . BUNIONECTOMY    . COLONOSCOPY WITH PROPOFOL N/A 04/04/2017   Procedure: COLONOSCOPY WITH PROPOFOL;  Surgeon: [name], MD;  Location: [facility] ENDOSCOPY;  Service: Endoscopy;  Laterality: N/A;  . DILATION AND CURETTAGE OF UTERUS  1971  . FOOT SURGERY    . VAGINAL DELIVERY    . VAGINAL HYSTERECTOMY      Family History  Problem Relation Age of Onset  . Heart disease Father 36       massive MI  . Diabetes Father   . Cancer Sister        breast  . Bipolar disorder Sister   . Osteoporosis Sister   . Breast cancer Sister 83  . Bipolar disorder Maternal Aunt   . Breast cancer Maternal Aunt   . Cancer Maternal Grandfather        colon cancer  . Colon cancer Maternal Grandfather 33  . Osteoporosis Mother   . Colon cancer Mother     Social History:  reports that she has never smoked. She has never used smokeless tobacco. She reports that she drinks alcohol. She reports that she does not use drugs.  Allergies:  Allergies  Allergen Reactions  . Codeine Nausea And Vomiting    Nausea and vomiting    Medications reviewed.   Review of Systems:   Review of Systems  Constitutional: Negative.   HENT: Negative.    Eyes: Negative.   Respiratory: Negative.   Cardiovascular: Negative.   Gastrointestinal: Positive for constipation and diarrhea. Negative for abdominal pain, heartburn, nausea and vomiting.  Genitourinary: Negative.   Musculoskeletal: Negative.   Skin: Negative.   Neurological: Negative.   Endo/Heme/Allergies: Negative.   Psychiatric/Behavioral: Negative.      Physical Exam:  BP (!) 172/79   Pulse 75   Temp 97.6 F (36.4 C) (Oral)   Ht 5\' 5"  (1.651 m)   Wt 230 lb (104.3 kg)   BMI 38.27 kg/m   Physical Exam  Constitutional: She is oriented to person, place, and time and well-developed, well-nourished, and in no distress. No distress.  Obese in no acute distress  HENT:  Head: Normocephalic and atraumatic.  Eyes: Pupils are equal, round, and reactive to light. Right eye exhibits no discharge. Left eye exhibits no discharge. No scleral icterus.  Neck: Normal range of motion.  Cardiovascular: Normal rate, regular rhythm and normal heart sounds.   Pulmonary/Chest: Effort normal and breath sounds normal. No respiratory distress. She has no wheezes. She has no rales.  Abdominal: Soft. She exhibits no distension. There is no tenderness. There is no rebound.  Ventral hernia in the supraumbilical hand-assisted site. It is reducible  and nontender  Musculoskeletal: Normal range of motion. She exhibits edema. She exhibits no tenderness.  Lymphadenopathy:    She has no cervical adenopathy.  Neurological: She is alert and oriented to person, place, and time.  Skin: Skin is warm and dry. No rash noted. She is not diaphoretic. No erythema.  Psychiatric: Mood and affect normal.  Vitals reviewed.     No results found for this or any previous visit (from the past 48 hour(s)). No results found.  Assessment/Plan:  Pathology is reviewed. CT scan and labs are reviewed. CEA is normal at 1.8. Also obtained medical clearance.  CT scan suggests a ventral hernia but more importantly it  suggests a very long tumor with eccentric growth suggestive of growth past the lumen.  The patient has had a significant history of GYN cancer which resulted in several treatments of radiation therapy. My biggest concern is that this tumor is quite long and would require a formal low anterior resection in the face of prior radiation. Patient is adamant against having a colostomy or a protecting ileostomy. I believe that with the patient's desires as well as the history of radiotherapy to the pelvis and rather large tumor patient would benefit from being evaluated by an colorectal surgeon. I gave her the option of [place] to [place]. We will arrange for surgical evaluation by the colorectal surgeon is [facility] [place] [facility] in [place].  Florene Glen, MD, FACS

## 2017-04-17 NOTE — Patient Instructions (Signed)
I will be faxing your information to [facility]. Once they review your chart, they will contact you with an appointment. They stated that it usually take them about a week to contact the patient. If you don't hear from them, please give [place] a call so we could check on the referral.

## 2017-04-18 ENCOUNTER — Encounter: Payer: Self-pay | Admitting: Family

## 2017-04-18 NOTE — Telephone Encounter (Signed)
Melissa,   Confused by this note.   I have reordered with note as to correct date 04/04/17 in which patient had colo.   Do we resend to [place]?

## 2017-04-18 NOTE — Telephone Encounter (Signed)
Please advise 

## 2017-04-19 ENCOUNTER — Telehealth: Payer: Self-pay | Admitting: *Deleted

## 2017-04-19 ENCOUNTER — Encounter: Payer: Self-pay | Admitting: Family

## 2017-04-19 ENCOUNTER — Telehealth: Payer: Self-pay | Admitting: Surgery

## 2017-04-19 ENCOUNTER — Ambulatory Visit: Payer: Self-pay | Admitting: Surgery

## 2017-04-19 NOTE — Telephone Encounter (Signed)
Left message for patient to return call back.  

## 2017-04-19 NOTE — Telephone Encounter (Signed)
Patient requested a call at [phone]

## 2017-04-19 NOTE — Telephone Encounter (Signed)
Patient was referred by Dr Burt Knack to [facility]. Patient has been scheduled with Dr Marcello Moores on 05/05/17 @ 9:10am. Patient has been advised.

## 2017-04-19 NOTE — Telephone Encounter (Signed)
Patient requested a call in reference to the authorization for her colonoscopy  Pt contact [phone]

## 2017-04-20 NOTE — Telephone Encounter (Signed)
Patient has been informed that the dates on referral has been changed.

## 2017-04-20 NOTE — Telephone Encounter (Signed)
After multiple phone calls to [facility] to fix this for [place] I found that it was not the date of service or lack of referral submitted from pcp's office. It was the tax id that was used when the claim was submitted. The tax id used is out of network with [employer] which was why it was denied and which is why they said a referral was required by the pcp for the patient to have this done. The tax id used was [id number]. Was told by [facility] that it needs to be billed under Dr. Dorothey Baseman NPI [id number] and it would be processed as in network. I contacted Traci at Dr. Dorothey Baseman office (which is the person who did the initial authorization request for the colonoscopy) and let her know what I found and that they will need to submit a W9 form and fax it to [phone].  Message sent to [facility] regarding this information. Ref# for my call [id number]

## 2017-04-21 ENCOUNTER — Encounter: Payer: Self-pay | Admitting: Family

## 2017-04-21 MED ORDER — AMOXICILLIN 500 MG PO CAPS: 500.0000 mg | ORAL_CAPSULE | Freq: Two times a day (BID) | ORAL | 0 refills | Status: DC

## 2017-04-21 NOTE — Telephone Encounter (Signed)
Medication has been sent to pharmacy as per provider request throught mychart.

## 2017-05-05 ENCOUNTER — Encounter: Payer: Self-pay | Admitting: Family

## 2017-05-05 ENCOUNTER — Other Ambulatory Visit: Payer: Self-pay | Admitting: General Surgery

## 2017-05-05 DIAGNOSIS — C187 Malignant neoplasm of sigmoid colon: Secondary | ICD-10-CM | POA: Diagnosis not present

## 2017-05-05 NOTE — H&P (Signed)
History of Present Illness Mandy Ruff MD; 06/24/9146 10:18 AM) The patient is a 76 year old female who presents with colorectal cancer. 76 year old female with a history of endometrial cancer treated with hysterectomy and radiation who presents to the office for evaluation of a sigmoid mass seen on colonoscopy. She had a positive cologaurd test which prompted the colonoscopy. A nonobstructing mass was seen approximately 20 cm from the anal verge. This was biopsied and tattooed. Biopsy showed adenocarcinoma. CT scan of the abdomen shows no sign of metastatic disease. CEA was normal. CT scan did show a supraumbilical hernia.   Past Surgical History Jerrye Bushy, [place]; 05/05/2017 9:40 AM) Foot Surgery Right. Hysterectomy (due to cancer) - Complete Oral Surgery  Diagnostic Studies History Jerrye Bushy, [place]; 05/05/2017 9:40 AM) Colonoscopy within last year Mammogram 1-3 years ago  Allergies Jerrye Bushy, [place]; 05/05/2017 9:41 AM) Codeine Phosphate *ANALGESICS - OPIOID* Nausea Allergies Reconciled  Medication History Jerrye Bushy, RMA; 05/05/2017 9:43 AM) Topiramate (50MG  Tablet, Oral) Active. Simvastatin (20MG  Tablet, Oral) Active. Metoprolol Succinate ER (100MG  Tablet ER 24HR, Oral) Active. Losartan Potassium (50MG  Tablet, Oral) Active. Flonase Allergy Relief (50MCG/ACT Suspension, Nasal) Active. Colace (100MG  Capsule, Oral) Active. Multiple Vitamin (Oral) Active.  Social History Jerrye Bushy, [place]; 05/05/2017 9:40 AM) Alcohol use Occasional alcohol use. Caffeine use Coffee, Tea. No drug use Tobacco use Never smoker.  Family History Jerrye Bushy, [place]; 05/05/2017 9:40 AM) Breast Cancer Family Members In General, Sister. Colon Cancer Mother. Colon Polyps Mother, Sister. Heart Disease Family Members In General, Father. Heart disease in female family member before age 14  Pregnancy / Birth History Jerrye Bushy, [place]; 05/05/2017 9:40 AM) Age at menarche 45  years. Age of menopause 32-50 Contraceptive History Oral contraceptives. Gravida 2 Maternal age 39-25 Para 2  Other Problems Jerrye Bushy, [place]; 05/05/2017 9:40 AM) Arthritis Cancer Colon Cancer General anesthesia - complications High blood pressure Oophorectomy     Review of Systems [facility] Rogers RMA; 05/05/2017 9:40 AM) General Present- Fatigue and Weight Gain. Not Present- Appetite Loss, Chills, Fever, Night Sweats and Weight Loss. Skin Present- Dryness. Not Present- Change in Wart/Mole, Hives, Jaundice, New Lesions, Non-Healing Wounds, Rash and Ulcer. HEENT Present- Hearing Loss and Wears glasses/contact lenses. Not Present- Earache, Hoarseness, Nose Bleed, Oral Ulcers, Ringing in the Ears, Seasonal Allergies, Sinus Pain, Sore Throat, Visual Disturbances and Yellow Eyes. Respiratory Present- Snoring. Not Present- Bloody sputum, Chronic Cough, Difficulty Breathing and Wheezing. Breast Not Present- Breast Mass, Breast Pain, Nipple Discharge and Skin Changes. Cardiovascular Not Present- Chest Pain, Difficulty Breathing Lying Down, Leg Cramps, Palpitations, Rapid Heart Rate, Shortness of Breath and Swelling of Extremities. Gastrointestinal Present- Bloating, Constipation, Excessive gas and Indigestion. Not Present- Abdominal Pain, Bloody Stool, Change in Bowel Habits, Chronic diarrhea, Difficulty Swallowing, Gets full quickly at meals, Hemorrhoids, Nausea, Rectal Pain and Vomiting. Female Genitourinary Not Present- Frequency, Nocturia, Painful Urination, Pelvic Pain and Urgency. Musculoskeletal Not Present- Back Pain, Joint Pain, Joint Stiffness, Muscle Pain, Muscle Weakness and Swelling of Extremities. Neurological Present- Tremor. Not Present- Decreased Memory, Fainting, Headaches, Numbness, Seizures, Tingling, Trouble walking and Weakness. Psychiatric Not Present- Anxiety, Bipolar, Change in Sleep Pattern, Depression, Fearful and Frequent crying. Endocrine Not Present- Cold  Intolerance, Excessive Hunger, Hair Changes, Heat Intolerance, Hot flashes and New Diabetes. Hematology Not Present- Blood Thinners, Easy Bruising, Excessive bleeding, Gland problems, HIV and Persistent Infections.  Vitals [employer] Rogers RMA; 05/05/2017 9:41 AM) 05/05/2017 9:40 AM Weight: 228.8 lb Height: 65in Body Surface Area: 2.09 m Body Mass Index: 38.07 kg/m  Temp.: 97.54F  Pulse: 72 (Regular)  P.OX: 97% (Room air) BP: 130/68 (Sitting, Left Arm, Standard)      Physical Exam Mandy Ruff MD; 6/38/4665 10:18 AM)  General Mental Status-Alert. General Appearance-Not in acute distress. Build & Nutrition-Well nourished. Posture-Normal posture. Gait-Normal.  Head and Neck Head-normocephalic, atraumatic with no lesions or palpable masses. Trachea-midline.  Chest and Lung Exam Chest and lung exam reveals -on auscultation, normal breath sounds, no adventitious sounds and normal vocal resonance.  Cardiovascular Cardiovascular examination reveals -normal heart sounds, regular rate and rhythm with no murmurs and no digital clubbing, cyanosis, edema, increased warmth or tenderness.  Abdomen Inspection Hernias - Incisional - Reducible. Note: Supraumbilical. Palpation/Percussion Palpation and Percussion of the abdomen reveal - Soft, Non Tender, No Rigidity (guarding), No hepatosplenomegaly and No Palpable abdominal masses.  Neurologic Neurologic evaluation reveals -alert and oriented x 3 with no impairment of recent or remote memory, normal attention span and ability to concentrate, normal sensation and normal coordination.  Musculoskeletal Normal Exam - Bilateral-Upper Extremity Strength Normal and Lower Extremity Strength Normal.    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 This appears to be a distal  sigmoid mass on CT scan. She does have a history of radiation to the pelvis. I have recommended a robotic partial colectomy with possible low anterior resection and possible bladder wall resection. We discussed the possibility of problems with healing due to her radiation but I do not think that this would necessitate a diverting ostomy. We will plan on doing a primary repair of her incisional hernia at the same time as her surgery. The surgery and anatomy were described to the patient as well as the risks of surgery and the possible complications. These include: Bleeding, deep abdominal infections and possible wound complications such as hernia and infection, damage to adjacent structures, leak of surgical connections, which can lead to other surgeries and possibly an ostomy, possible need for other procedures, such as abscess drains in radiology, possible prolonged hospital stay, possible diarrhea from removal of part of the colon, possible constipation from narcotics, possible bowel, bladder or sexual dysfunction if having rectal surgery, prolonged fatigue/weakness or appetite loss, possible early recurrence of of disease, possible complications of their medical problems such as heart disease or arrhythmias or lung problems, death (less than 1%). I believe the patient understands and wishes to proceed with the surgery.

## 2017-05-08 ENCOUNTER — Encounter: Payer: Self-pay | Admitting: Family

## 2017-05-25 ENCOUNTER — Encounter: Payer: Self-pay | Admitting: Family

## 2017-05-29 ENCOUNTER — Encounter: Payer: Self-pay | Admitting: Family

## 2017-05-29 NOTE — Patient Instructions (Addendum)
Rubyann Lingle [place]  05/29/2017   Your procedure is scheduled on: 06/07/2017    Report to [facility] Main  Entrance Take [place]  elevators to 3rd floor to  [place] at    1100     Call this number if you have problems the morning of surgery [phone]    Remember: ONLY 1 PERSON MAY GO WITH YOU TO SHORT STAY TO GET  READY MORNING OF YOUR SURGERY.  Do not eat food or drink liquids :After Midnight.  Clear liquid diet the day before surgery.    Take these medicines the morning of surgery with A SIP OF WATER: Flonase if needed, Metoprolol ( Toprol), Topamax                                 You may not have any metal on your body including hair pins and              piercings  Do not wear jewelry, make-up, lotions, powders or perfumes, deodorant             Do not wear nail polish.  Do not shave  48 hours prior to surgery.     Do not bring valuables to the hospital. [place].  Contacts, dentures or bridgework may not be worn into surgery.  Leave suitcase in the car. After surgery it may be brought to your room.     :                Please read over the following fact sheets you were given: _____________________________________________________________________             [facility] - Preparing for Surgery Before surgery, you can play an important role.  Because skin is not sterile, your skin needs to be as free of germs as possible.  You can reduce the number of germs on your skin by washing with CHG (chlorahexidine gluconate) soap before surgery.  CHG is an antiseptic cleaner which kills germs and bonds with the skin to continue killing germs even after washing. Please DO NOT use if you have an allergy to CHG or antibacterial soaps.  If your skin becomes reddened/irritated stop using the CHG and inform your nurse when you arrive at Short Stay. Do not shave (including legs and underarms) for at least 48  hours prior to the first CHG shower.  You may shave your face/neck. Please follow these instructions carefully:  1.  Shower with CHG Soap the night before surgery and the  morning of Surgery.  2.  If you choose to wash your hair, wash your hair first as usual with your  normal  shampoo.  3.  After you shampoo, rinse your hair and body thoroughly to remove the  shampoo.                           4.  Use CHG as you would any other liquid soap.  You can apply chg directly  to the skin and wash                       Gently with a scrungie or clean  washcloth.  5.  Apply the CHG Soap to your body ONLY FROM THE NECK DOWN.   Do not use on face/ open                           Wound or open sores. Avoid contact with eyes, ears mouth and genitals (private parts).                       Wash face,  Genitals (private parts) with your normal soap.             6.  Wash thoroughly, paying special attention to the area where your surgery  will be performed.  7.  Thoroughly rinse your body with warm water from the neck down.  8.  DO NOT shower/wash with your normal soap after using and rinsing off  the CHG Soap.                9.  Pat yourself dry with a clean towel.            10.  Wear clean pajamas.            11.  Place clean sheets on your bed the night of your first shower and do not  sleep with pets. Day of Surgery : Do not apply any lotions/deodorants the morning of surgery.  Please wear clean clothes to the hospital/surgery center.  FAILURE TO FOLLOW THESE INSTRUCTIONS MAY RESULT IN THE CANCELLATION OF YOUR SURGERY PATIENT SIGNATURE_________________________________  NURSE SIGNATURE__________________________________  ________________________________________________________________________    CLEAR LIQUID DIET   Foods Allowed                                                                     Foods Excluded  Coffee and tea, regular and decaf                             liquids that you cannot   Plain Jell-O in any flavor                                             see through such as: Fruit ices (not with fruit pulp)                                     milk, soups, orange juice  Iced Popsicles                                    All solid food Carbonated beverages, regular and diet                                    Cranberry, grape and apple juices Sports drinks like Gatorade Lightly seasoned clear broth or consume(fat free) Sugar, honey syrup  Sample Menu Breakfast  Lunch                                     Supper Cranberry juice                    Beef broth                            Chicken broth Jell-O                                     Grape juice                           Apple juice Coffee or tea                        Jell-O                                      Popsicle                                                Coffee or tea                        Coffee or tea  _____________________________________________________________________   WHAT IS A BLOOD TRANSFUSION? Blood Transfusion Information  A transfusion is the replacement of blood or some of its parts. Blood is made up of multiple cells which provide different functions.  Red blood cells carry oxygen and are used for blood loss replacement.  White blood cells fight against infection.  Platelets control bleeding.  Plasma helps clot blood.  Other blood products are available for specialized needs, such as hemophilia or other clotting disorders. BEFORE THE TRANSFUSION  Who gives blood for transfusions?   Healthy volunteers who are fully evaluated to make sure their blood is safe. This is blood bank blood. Transfusion therapy is the safest it has ever been in the practice of medicine. Before blood is taken from a donor, a complete history is taken to make sure that person has no history of diseases nor engages in risky social behavior (examples are intravenous drug use or  sexual activity with multiple partners). The donor's travel history is screened to minimize risk of transmitting infections, such as malaria. The donated blood is tested for signs of infectious diseases, such as HIV and hepatitis. The blood is then tested to be sure it is compatible with you in order to minimize the chance of a transfusion reaction. If you or a relative donates blood, this is often done in anticipation of surgery and is not appropriate for emergency situations. It takes many days to process the donated blood. RISKS AND COMPLICATIONS Although transfusion therapy is very safe and saves many lives, the main dangers of transfusion include:   Getting an infectious disease.  Developing a transfusion reaction. This is an allergic reaction to something in the blood you were given. Every precaution is taken to prevent this. The decision to have  a blood transfusion has been considered carefully by your caregiver before blood is given. Blood is not given unless the benefits outweigh the risks. AFTER THE TRANSFUSION  Right after receiving a blood transfusion, you will usually feel much better and more energetic. This is especially true if your red blood cells have gotten low (anemic). The transfusion raises the level of the red blood cells which carry oxygen, and this usually causes an energy increase.  The nurse administering the transfusion will monitor you carefully for complications. HOME CARE INSTRUCTIONS  No special instructions are needed after a transfusion. You may find your energy is better. Speak with your caregiver about any limitations on activity for underlying diseases you may have. SEEK MEDICAL CARE IF:   Your condition is not improving after your transfusion.  You develop redness or irritation at the intravenous (IV) site. SEEK IMMEDIATE MEDICAL CARE IF:  Any of the following symptoms occur over the next 12 hours:  Shaking chills.  You have a temperature by mouth above  102 F (38.9 C), not controlled by medicine.  Chest, back, or muscle pain.  People around you feel you are not acting correctly or are confused.  Shortness of breath or difficulty breathing.  Dizziness and fainting.  You get a rash or develop hives.  You have a decrease in urine output.  Your urine turns a dark color or changes to pink, red, or brown. Any of the following symptoms occur over the next 10 days:  You have a temperature by mouth above 102 F (38.9 C), not controlled by medicine.  Shortness of breath.  Weakness after normal activity.  The white part of the eye turns yellow (jaundice).  You have a decrease in the amount of urine or are urinating less often.  Your urine turns a dark color or changes to pink, red, or brown. Document Released: 10/28/2000 Document Revised: 01/23/2012 Document Reviewed: 06/16/2008 [facility] Patient Information 2014 [place], [place].  _______________________________________________________________________

## 2017-05-30 ENCOUNTER — Other Ambulatory Visit: Payer: Self-pay | Admitting: Family

## 2017-05-30 ENCOUNTER — Encounter: Payer: Self-pay | Admitting: Family

## 2017-05-30 DIAGNOSIS — H353132 Nonexudative age-related macular degeneration, bilateral, intermediate dry stage: Secondary | ICD-10-CM | POA: Diagnosis not present

## 2017-05-30 DIAGNOSIS — H2513 Age-related nuclear cataract, bilateral: Secondary | ICD-10-CM | POA: Diagnosis not present

## 2017-05-30 DIAGNOSIS — D490 Neoplasm of unspecified behavior of digestive system: Secondary | ICD-10-CM

## 2017-05-30 DIAGNOSIS — H353190 Nonexudative age-related macular degeneration, unspecified eye, stage unspecified: Secondary | ICD-10-CM | POA: Diagnosis not present

## 2017-05-31 ENCOUNTER — Encounter (HOSPITAL_COMMUNITY)
Admission: RE | Admit: 2017-05-31 | Discharge: 2017-05-31 | Disposition: A | Discharge status: Home / Self Care | Payer: Medicare HMO | Source: Ambulatory Visit | Attending: General Surgery | Admitting: General Surgery

## 2017-05-31 ENCOUNTER — Encounter (HOSPITAL_COMMUNITY): Payer: Self-pay

## 2017-05-31 ENCOUNTER — Encounter: Payer: Self-pay | Admitting: Family

## 2017-05-31 DIAGNOSIS — C189 Malignant neoplasm of colon, unspecified: Secondary | ICD-10-CM | POA: Insufficient documentation

## 2017-05-31 DIAGNOSIS — Z01812 Encounter for preprocedural laboratory examination: Secondary | ICD-10-CM | POA: Insufficient documentation

## 2017-05-31 DIAGNOSIS — K432 Incisional hernia without obstruction or gangrene: Secondary | ICD-10-CM | POA: Insufficient documentation

## 2017-05-31 HISTORY — DX: Other specified postprocedural states: Z98.890

## 2017-05-31 HISTORY — DX: Other complications of anesthesia, initial encounter: T88.59XA

## 2017-05-31 HISTORY — DX: Gastro-esophageal reflux disease without esophagitis: K21.9

## 2017-05-31 HISTORY — DX: Adverse effect of unspecified anesthetic, initial encounter: T41.45XA

## 2017-05-31 HISTORY — DX: Other specified postprocedural states: R11.2

## 2017-05-31 LAB — CBC
HEMATOCRIT: 38.1 % (ref 36.0–46.0)
HEMOGLOBIN: 12.3 g/dL (ref 12.0–15.0)
MCH: 28.1 pg (ref 26.0–34.0)
MCHC: 32.3 g/dL (ref 30.0–36.0)
MCV: 87 fL (ref 78.0–100.0)
Platelets: 255 10*3/uL (ref 150–400)
RBC: 4.38 MIL/uL (ref 3.87–5.11)
RDW: 14.1 % (ref 11.5–15.5)
WBC: 7.4 10*3/uL (ref 4.0–10.5)

## 2017-05-31 LAB — BASIC METABOLIC PANEL
ANION GAP: 9 (ref 5–15)
BUN: 15 mg/dL (ref 6–20)
CHLORIDE: 109 mmol/L (ref 101–111)
CO2: 23 mmol/L (ref 22–32)
Calcium: 9.2 mg/dL (ref 8.9–10.3)
Creatinine, Ser: 0.88 mg/dL (ref 0.44–1.00)
GFR calc Af Amer: >60 mL/min (ref >60)
GLUCOSE: 122 mg/dL — AB (ref 65–99)
POTASSIUM: 4.6 mmol/L (ref 3.5–5.1)
Sodium: 141 mmol/L (ref 135–145)

## 2017-05-31 LAB — ABO/RH: ABO/RH(D): A POS

## 2017-05-31 NOTE — Progress Notes (Addendum)
04/11/17-ekg-epic  04/12/17-medical clearance noted in mediafrom PCP- Dr Vidal Schwalbe.

## 2017-06-01 LAB — HEMOGLOBIN A1C
HEMOGLOBIN A1C: 6.1 % — AB (ref 4.8–5.6)
MEAN PLASMA GLUCOSE: 128 mg/dL

## 2017-06-07 ENCOUNTER — Inpatient Hospital Stay (HOSPITAL_COMMUNITY): Payer: Medicare HMO | Admitting: Registered Nurse

## 2017-06-07 ENCOUNTER — Encounter (HOSPITAL_COMMUNITY)
Admission: RE | Disposition: A | Discharge status: Home / Self Care | Payer: Self-pay | Source: Ambulatory Visit | Attending: General Surgery

## 2017-06-07 ENCOUNTER — Encounter (HOSPITAL_COMMUNITY): Payer: Self-pay | Admitting: *Deleted

## 2017-06-07 ENCOUNTER — Inpatient Hospital Stay (HOSPITAL_COMMUNITY)
Admission: RE | Admit: 2017-06-07 | Discharge: 2017-06-11 | DRG: 331 | Disposition: A | Discharge status: Home / Self Care | Payer: Medicare HMO | Source: Ambulatory Visit | Attending: General Surgery | Admitting: General Surgery

## 2017-06-07 DIAGNOSIS — C186 Malignant neoplasm of descending colon: Secondary | ICD-10-CM | POA: Diagnosis present

## 2017-06-07 DIAGNOSIS — I10 Essential (primary) hypertension: Secondary | ICD-10-CM | POA: Diagnosis present

## 2017-06-07 DIAGNOSIS — K219 Gastro-esophageal reflux disease without esophagitis: Secondary | ICD-10-CM | POA: Diagnosis not present

## 2017-06-07 DIAGNOSIS — E785 Hyperlipidemia, unspecified: Secondary | ICD-10-CM | POA: Diagnosis not present

## 2017-06-07 DIAGNOSIS — Z90710 Acquired absence of both cervix and uterus: Secondary | ICD-10-CM | POA: Diagnosis not present

## 2017-06-07 DIAGNOSIS — Z8542 Personal history of malignant neoplasm of other parts of uterus: Secondary | ICD-10-CM

## 2017-06-07 DIAGNOSIS — C187 Malignant neoplasm of sigmoid colon: Principal | ICD-10-CM | POA: Diagnosis present

## 2017-06-07 DIAGNOSIS — H3530 Unspecified macular degeneration: Secondary | ICD-10-CM | POA: Diagnosis present

## 2017-06-07 DIAGNOSIS — K432 Incisional hernia without obstruction or gangrene: Secondary | ICD-10-CM | POA: Diagnosis not present

## 2017-06-07 DIAGNOSIS — Z923 Personal history of irradiation: Secondary | ICD-10-CM | POA: Diagnosis not present

## 2017-06-07 DIAGNOSIS — Z79899 Other long term (current) drug therapy: Secondary | ICD-10-CM

## 2017-06-07 DIAGNOSIS — Z6836 Body mass index (BMI) 36.0-36.9, adult: Secondary | ICD-10-CM

## 2017-06-07 DIAGNOSIS — Z800 Family history of malignant neoplasm of digestive organs: Secondary | ICD-10-CM

## 2017-06-07 DIAGNOSIS — Z885 Allergy status to narcotic agent status: Secondary | ICD-10-CM | POA: Diagnosis not present

## 2017-06-07 DIAGNOSIS — C189 Malignant neoplasm of colon, unspecified: Secondary | ICD-10-CM | POA: Diagnosis not present

## 2017-06-07 HISTORY — PX: ROBOTIC ASSISTED LAPAROSCOPIC VENTRAL/INCISIONAL HERNIA REPAIR: SHX6607

## 2017-06-07 LAB — TYPE AND SCREEN
ABO/RH(D): A POS
ANTIBODY SCREEN: NEGATIVE

## 2017-06-07 SURGERY — COLECTOMY, PARTIAL, ROBOT-ASSISTED, LAPAROSCOPIC
Anesthesia: General

## 2017-06-07 MED ORDER — ACETAMINOPHEN 500 MG PO TABS
1000.0000 mg | ORAL_TABLET | Freq: Four times a day (QID) | ORAL | Status: DC
  Filled 2017-06-07: qty 2

## 2017-06-07 MED ORDER — KCL IN DEXTROSE-NACL 20-5-0.45 MEQ/L-%-% IV SOLN
INTRAVENOUS | Status: DC
  Administered 2017-06-07: 75 mL/h via INTRAVENOUS
  Administered 2017-06-09: 1000 mL via INTRAVENOUS
  Filled 2017-06-07 (×2): qty 1000

## 2017-06-07 MED ORDER — EPHEDRINE SULFATE 50 MG/ML IJ SOLN
INTRAMUSCULAR | Status: DC | PRN
  Administered 2017-06-07 (×2): 10 mg via INTRAVENOUS

## 2017-06-07 MED ORDER — MORPHINE SULFATE (PF) 2 MG/ML IV SOLN: 2.0000 mg | INTRAVENOUS | Status: DC | PRN

## 2017-06-07 MED ORDER — ALVIMOPAN 12 MG PO CAPS
12.0000 mg | ORAL_CAPSULE | Freq: Two times a day (BID) | ORAL | Status: DC
  Administered 2017-06-08 – 2017-06-09 (×3): 12 mg via ORAL
  Filled 2017-06-07 (×3): qty 1

## 2017-06-07 MED ORDER — PROMETHAZINE HCL 25 MG/ML IJ SOLN
INTRAMUSCULAR | Status: AC
  Filled 2017-06-07: qty 1

## 2017-06-07 MED ORDER — ONDANSETRON HCL 4 MG PO TABS: 4.0000 mg | ORAL_TABLET | Freq: Four times a day (QID) | ORAL | Status: DC | PRN

## 2017-06-07 MED ORDER — DEXAMETHASONE SODIUM PHOSPHATE 10 MG/ML IJ SOLN
INTRAMUSCULAR | Status: DC | PRN
  Administered 2017-06-07: 10 mg via INTRAVENOUS

## 2017-06-07 MED ORDER — SUFENTANIL CITRATE 50 MCG/ML IV SOLN
INTRAVENOUS | Status: AC
  Filled 2017-06-07: qty 1

## 2017-06-07 MED ORDER — BUPIVACAINE LIPOSOME 1.3 % IJ SUSP
20.0000 mL | INTRAMUSCULAR | Status: DC
  Filled 2017-06-07: qty 20

## 2017-06-07 MED ORDER — SCOPOLAMINE 1 MG/3DAYS TD PT72
1.0000 | MEDICATED_PATCH | Freq: Once | TRANSDERMAL | Status: AC
  Administered 2017-06-07: 1 via TRANSDERMAL

## 2017-06-07 MED ORDER — PROPOFOL 10 MG/ML IV BOLUS
INTRAVENOUS | Status: AC
  Filled 2017-06-07: qty 20

## 2017-06-07 MED ORDER — TOPIRAMATE 25 MG PO TABS
100.0000 mg | ORAL_TABLET | Freq: Two times a day (BID) | ORAL | Status: DC
  Administered 2017-06-07: 100 mg via ORAL
  Administered 2017-06-08: 50 mg via ORAL
  Administered 2017-06-08: 100 mg via ORAL
  Administered 2017-06-09: 50 mg via ORAL
  Filled 2017-06-07 (×4): qty 4

## 2017-06-07 MED ORDER — PROMETHAZINE HCL 25 MG/ML IJ SOLN
6.2500 mg | INTRAMUSCULAR | Status: DC | PRN
  Administered 2017-06-07: 6.25 mg via INTRAVENOUS

## 2017-06-07 MED ORDER — SCOPOLAMINE 1 MG/3DAYS TD PT72
MEDICATED_PATCH | TRANSDERMAL | Status: AC
  Filled 2017-06-07: qty 1

## 2017-06-07 MED ORDER — ALUM & MAG HYDROXIDE-SIMETH 200-200-20 MG/5ML PO SUSP
30.0000 mL | Freq: Four times a day (QID) | ORAL | Status: DC | PRN
  Administered 2017-06-10: 30 mL via ORAL
  Filled 2017-06-07: qty 30

## 2017-06-07 MED ORDER — SODIUM CHLORIDE 0.9 % IJ SOLN
INTRAMUSCULAR | Status: AC
  Filled 2017-06-07: qty 10

## 2017-06-07 MED ORDER — BUPIVACAINE LIPOSOME 1.3 % IJ SUSP
INTRAMUSCULAR | Status: DC | PRN
  Administered 2017-06-07: 20 mL

## 2017-06-07 MED ORDER — ONDANSETRON HCL 4 MG/2ML IJ SOLN
4.0000 mg | Freq: Four times a day (QID) | INTRAMUSCULAR | Status: DC | PRN
  Administered 2017-06-07: 4 mg via INTRAVENOUS
  Filled 2017-06-07: qty 2

## 2017-06-07 MED ORDER — METOPROLOL SUCCINATE ER 50 MG PO TB24
100.0000 mg | ORAL_TABLET | Freq: Every day | ORAL | Status: DC
  Administered 2017-06-08 – 2017-06-11 (×4): 100 mg via ORAL
  Filled 2017-06-07 (×4): qty 2

## 2017-06-07 MED ORDER — MIDAZOLAM HCL 2 MG/2ML IJ SOLN
INTRAMUSCULAR | Status: AC
  Filled 2017-06-07: qty 2

## 2017-06-07 MED ORDER — LOSARTAN POTASSIUM 50 MG PO TABS
50.0000 mg | ORAL_TABLET | Freq: Every day | ORAL | Status: DC
  Administered 2017-06-08 – 2017-06-11 (×4): 50 mg via ORAL
  Filled 2017-06-07 (×4): qty 1

## 2017-06-07 MED ORDER — CEFOTETAN DISODIUM-DEXTROSE 2-2.08 GM-% IV SOLR
2.0000 g | Freq: Two times a day (BID) | INTRAVENOUS | Status: AC
  Administered 2017-06-08: 2 g via INTRAVENOUS
  Filled 2017-06-07: qty 50

## 2017-06-07 MED ORDER — LACTATED RINGERS IR SOLN
Status: DC | PRN
  Administered 2017-06-07: 2000 mL

## 2017-06-07 MED ORDER — ROCURONIUM BROMIDE 100 MG/10ML IV SOLN
INTRAVENOUS | Status: DC | PRN
  Administered 2017-06-07: 50 mg via INTRAVENOUS
  Administered 2017-06-07: 10 mg via INTRAVENOUS
  Administered 2017-06-07: 20 mg via INTRAVENOUS

## 2017-06-07 MED ORDER — SUGAMMADEX SODIUM 200 MG/2ML IV SOLN
INTRAVENOUS | Status: DC | PRN
  Administered 2017-06-07: 200 mg via INTRAVENOUS

## 2017-06-07 MED ORDER — ENOXAPARIN SODIUM 40 MG/0.4ML ~~LOC~~ SOLN
40.0000 mg | SUBCUTANEOUS | Status: DC
  Administered 2017-06-08 – 2017-06-11 (×4): 40 mg via SUBCUTANEOUS
  Filled 2017-06-07 (×4): qty 0.4

## 2017-06-07 MED ORDER — PROPOFOL 500 MG/50ML IV EMUL
INTRAVENOUS | Status: DC | PRN
  Administered 2017-06-07: 50 ug/kg/min via INTRAVENOUS

## 2017-06-07 MED ORDER — GABAPENTIN 300 MG PO CAPS
300.0000 mg | ORAL_CAPSULE | ORAL | Status: AC
  Administered 2017-06-07: 300 mg via ORAL
  Filled 2017-06-07: qty 1

## 2017-06-07 MED ORDER — LACTATED RINGERS IV SOLN
INTRAVENOUS | Status: DC | PRN
  Administered 2017-06-07 (×3): via INTRAVENOUS

## 2017-06-07 MED ORDER — PROPOFOL 10 MG/ML IV BOLUS
INTRAVENOUS | Status: DC | PRN
  Administered 2017-06-07: 20 mg via INTRAVENOUS
  Administered 2017-06-07: 220 mg via INTRAVENOUS

## 2017-06-07 MED ORDER — MIDAZOLAM HCL 2 MG/2ML IJ SOLN: 0.5000 mg | Freq: Once | INTRAMUSCULAR | Status: DC | PRN

## 2017-06-07 MED ORDER — CEFOTETAN DISODIUM-DEXTROSE 2-2.08 GM-% IV SOLR
2.0000 g | INTRAVENOUS | Status: AC
  Administered 2017-06-07: 2 g via INTRAVENOUS
  Filled 2017-06-07: qty 50

## 2017-06-07 MED ORDER — ALVIMOPAN 12 MG PO CAPS
12.0000 mg | ORAL_CAPSULE | ORAL | Status: AC
  Administered 2017-06-07: 12 mg via ORAL
  Filled 2017-06-07: qty 1

## 2017-06-07 MED ORDER — BUPIVACAINE-EPINEPHRINE 0.25% -1:200000 IJ SOLN
INTRAMUSCULAR | Status: AC
  Filled 2017-06-07: qty 1

## 2017-06-07 MED ORDER — ACETAMINOPHEN 500 MG PO TABS
1000.0000 mg | ORAL_TABLET | ORAL | Status: AC
  Administered 2017-06-07: 1000 mg via ORAL
  Filled 2017-06-07: qty 2

## 2017-06-07 MED ORDER — HYDROMORPHONE HCL 2 MG/ML IJ SOLN
INTRAMUSCULAR | Status: AC
  Filled 2017-06-07: qty 1

## 2017-06-07 MED ORDER — HYDROMORPHONE HCL-NACL 0.5-0.9 MG/ML-% IV SOSY: 0.2500 mg | PREFILLED_SYRINGE | INTRAVENOUS | Status: DC | PRN

## 2017-06-07 MED ORDER — SUFENTANIL CITRATE 50 MCG/ML IV SOLN
INTRAVENOUS | Status: DC | PRN
  Administered 2017-06-07: 20 ug via INTRAVENOUS
  Administered 2017-06-07 (×7): 10 ug via INTRAVENOUS

## 2017-06-07 MED ORDER — BUPIVACAINE-EPINEPHRINE 0.25% -1:200000 IJ SOLN
INTRAMUSCULAR | Status: DC | PRN
  Administered 2017-06-07: 50 mL

## 2017-06-07 MED ORDER — LIDOCAINE HCL (CARDIAC) 20 MG/ML IV SOLN
INTRAVENOUS | Status: DC | PRN
  Administered 2017-06-07: 75 mg via INTRAVENOUS

## 2017-06-07 MED ORDER — ONDANSETRON HCL 4 MG/2ML IJ SOLN
INTRAMUSCULAR | Status: DC | PRN
  Administered 2017-06-07: 4 mg via INTRAVENOUS

## 2017-06-07 MED ORDER — 0.9 % SODIUM CHLORIDE (POUR BTL) OPTIME
TOPICAL | Status: DC | PRN
  Administered 2017-06-07: 2000 mL

## 2017-06-07 MED ORDER — HEPARIN SODIUM (PORCINE) 5000 UNIT/ML IJ SOLN
5000.0000 [IU] | Freq: Once | INTRAMUSCULAR | Status: AC
  Administered 2017-06-07: 5000 [IU] via SUBCUTANEOUS
  Filled 2017-06-07: qty 1

## 2017-06-07 MED ORDER — MIDAZOLAM HCL 5 MG/5ML IJ SOLN
INTRAMUSCULAR | Status: DC | PRN
  Administered 2017-06-07: 1 mg via INTRAVENOUS

## 2017-06-07 MED ORDER — MEPERIDINE HCL 50 MG/ML IJ SOLN: 6.2500 mg | INTRAMUSCULAR | Status: DC | PRN

## 2017-06-07 MED ORDER — FLUORESCEIN SODIUM 10 % IV SOLN
INTRAVENOUS | Status: DC | PRN
  Administered 2017-06-07: 7.5 mg via INTRAVENOUS

## 2017-06-07 MED ORDER — LACTATED RINGERS IV SOLN
INTRAVENOUS | Status: DC | PRN
  Administered 2017-06-07 (×5): via INTRAVENOUS

## 2017-06-07 SURGICAL SUPPLY — 95 items
BLADE EXTENDED COATED 6.5IN (ELECTRODE) IMPLANT
CANNULA REDUC XI 12-8 STAPL (CANNULA) ×1
CANNULA REDUC XI 12-8MM STAPL (CANNULA) ×1
CANNULA REDUCER 12-8 DVNC XI (CANNULA) ×1 IMPLANT
CELLS DAT CNTRL 66122 CELL SVR (MISCELLANEOUS) IMPLANT
CLIP LIGATING HEM O LOK PURPLE (MISCELLANEOUS) IMPLANT
CLIP LIGATING HEMOLOK MED (MISCELLANEOUS) IMPLANT
COUNTER NEEDLE 20 DBL MAG RED (NEEDLE) ×3 IMPLANT
COVER MAYO STAND STRL (DRAPES) ×6 IMPLANT
COVER SURGICAL LIGHT HANDLE (MISCELLANEOUS) ×3 IMPLANT
COVER TIP SHEARS 8 DVNC (MISCELLANEOUS) ×1 IMPLANT
COVER TIP SHEARS 8MM DA VINCI (MISCELLANEOUS) ×2
DECANTER SPIKE VIAL GLASS SM (MISCELLANEOUS) ×3 IMPLANT
DEVICE TROCAR PUNCTURE CLOSURE (ENDOMECHANICALS) IMPLANT
DRAIN CHANNEL 19F RND (DRAIN) IMPLANT
DRAPE ARM DVNC X/XI (DISPOSABLE) ×4 IMPLANT
DRAPE COLUMN DVNC XI (DISPOSABLE) ×1 IMPLANT
DRAPE DA VINCI XI ARM (DISPOSABLE) ×8
DRAPE DA VINCI XI COLUMN (DISPOSABLE) ×2
DRAPE SURG IRRIG POUCH 19X23 (DRAPES) ×3 IMPLANT
DRSG OPSITE POSTOP 4X10 (GAUZE/BANDAGES/DRESSINGS) IMPLANT
DRSG OPSITE POSTOP 4X6 (GAUZE/BANDAGES/DRESSINGS) ×3 IMPLANT
DRSG OPSITE POSTOP 4X8 (GAUZE/BANDAGES/DRESSINGS) IMPLANT
ELECT PENCIL ROCKER SW 15FT (MISCELLANEOUS) ×6 IMPLANT
ELECT REM PT RETURN 15FT ADLT (MISCELLANEOUS) ×3 IMPLANT
ENDOLOOP SUT PDS II  0 18 (SUTURE)
ENDOLOOP SUT PDS II 0 18 (SUTURE) IMPLANT
EVACUATOR SILICONE 100CC (DRAIN) IMPLANT
GAUZE SPONGE 4X4 12PLY STRL (GAUZE/BANDAGES/DRESSINGS) IMPLANT
GLOVE BIO SURGEON STRL SZ 6.5 (GLOVE) ×6 IMPLANT
GLOVE BIO SURGEONS STRL SZ 6.5 (GLOVE) ×3
GLOVE BIOGEL PI IND STRL 7.0 (GLOVE) ×3 IMPLANT
GLOVE BIOGEL PI INDICATOR 7.0 (GLOVE) ×6
GOWN STRL REUS W/TWL 2XL LVL3 (GOWN DISPOSABLE) ×9 IMPLANT
GOWN STRL REUS W/TWL XL LVL3 (GOWN DISPOSABLE) ×12 IMPLANT
GRASPER ENDOPATH ANVIL 10MM (MISCELLANEOUS) IMPLANT
HOLDER FOLEY CATH W/STRAP (MISCELLANEOUS) ×3 IMPLANT
IRRIG SUCT STRYKERFLOW 2 WTIP (MISCELLANEOUS) ×3
IRRIGATION SUCT STRKRFLW 2 WTP (MISCELLANEOUS) ×1 IMPLANT
KIT PROCEDURE DA VINCI SI (MISCELLANEOUS) ×2
KIT PROCEDURE DVNC SI (MISCELLANEOUS) ×1 IMPLANT
LEGGING LITHOTOMY PAIR STRL (DRAPES) ×3 IMPLANT
NEEDLE INSUFFLATION 14GA 120MM (NEEDLE) ×3 IMPLANT
PACK CARDIOVASCULAR III (CUSTOM PROCEDURE TRAY) ×3 IMPLANT
PACK COLON (CUSTOM PROCEDURE TRAY) ×3 IMPLANT
PORT LAP GEL ALEXIS MED 5-9CM (MISCELLANEOUS) IMPLANT
RTRCTR WOUND ALEXIS 18CM MED (MISCELLANEOUS)
SCISSORS LAP 5X35 DISP (ENDOMECHANICALS) ×3 IMPLANT
SEAL CANN UNIV 5-8 DVNC XI (MISCELLANEOUS) ×3 IMPLANT
SEAL XI 5MM-8MM UNIVERSAL (MISCELLANEOUS) ×6
SEALER VESSEL DA VINCI XI (MISCELLANEOUS) ×2
SEALER VESSEL EXT DVNC XI (MISCELLANEOUS) ×1 IMPLANT
SLEEVE ADV FIXATION 5X100MM (TROCAR) IMPLANT
SOLUTION ELECTROLUBE (MISCELLANEOUS) ×3 IMPLANT
STAPLER 45 BLU RELOAD XI (STAPLE) IMPLANT
STAPLER 45 BLUE RELOAD XI (STAPLE)
STAPLER 45 GREEN RELOAD XI (STAPLE) ×4
STAPLER 45 GRN RELOAD XI (STAPLE) ×2 IMPLANT
STAPLER CANNULA SEAL DVNC XI (STAPLE) ×1 IMPLANT
STAPLER CANNULA SEAL XI (STAPLE) ×2
STAPLER CIRC ILS CVD 33MM 37CM (STAPLE) ×3 IMPLANT
STAPLER SHEATH (SHEATH) ×2
STAPLER SHEATH ENDOWRIST DVNC (SHEATH) ×1 IMPLANT
STAPLER VISISTAT 35W (STAPLE) ×3 IMPLANT
SUT ETHILON 2 0 PS N (SUTURE) IMPLANT
SUT NOVA NAB GS-21 0 18 T12 DT (SUTURE) ×6 IMPLANT
SUT NOVA NAB GS-21 1 T12 (SUTURE) ×30 IMPLANT
SUT PDS AB 1 CTX 36 (SUTURE) IMPLANT
SUT PDS AB 1 TP1 96 (SUTURE) IMPLANT
SUT PROLENE 2 0 KS (SUTURE) ×6 IMPLANT
SUT SILK 2 0 (SUTURE) ×2
SUT SILK 2 0 SH CR/8 (SUTURE) ×3 IMPLANT
SUT SILK 2-0 18XBRD TIE 12 (SUTURE) ×1 IMPLANT
SUT SILK 3 0 (SUTURE) ×2
SUT SILK 3 0 SH CR/8 (SUTURE) ×3 IMPLANT
SUT SILK 3-0 18XBRD TIE 12 (SUTURE) ×1 IMPLANT
SUT V-LOC BARB 180 2/0GR6 GS22 (SUTURE)
SUT VIC AB 2-0 SH 18 (SUTURE) ×6 IMPLANT
SUT VIC AB 2-0 SH 27 (SUTURE) ×2
SUT VIC AB 2-0 SH 27X BRD (SUTURE) ×1 IMPLANT
SUT VIC AB 3-0 SH 18 (SUTURE) ×3 IMPLANT
SUT VIC AB 4-0 PS2 18 (SUTURE) ×3 IMPLANT
SUT VIC AB 4-0 PS2 27 (SUTURE) ×6 IMPLANT
SUT VICRYL 0 UR6 27IN ABS (SUTURE) ×6 IMPLANT
SUTURE V-LC BRB 180 2/0GR6GS22 (SUTURE) IMPLANT
SYR 10ML LL (SYRINGE) ×3 IMPLANT
SYS LAPSCP GELPORT 120MM (MISCELLANEOUS)
SYSTEM LAPSCP GELPORT 120MM (MISCELLANEOUS) IMPLANT
TOWEL OR 17X26 10 PK STRL BLUE (TOWEL DISPOSABLE) ×3 IMPLANT
TOWEL OR NON WOVEN STRL DISP B (DISPOSABLE) ×3 IMPLANT
TRAY FOLEY W/METER SILVER 16FR (SET/KITS/TRAYS/PACK) ×3 IMPLANT
TROCAR ADV FIXATION 5X100MM (TROCAR) ×3 IMPLANT
TUBING CONNECTING 10 (TUBING) IMPLANT
TUBING CONNECTING 10' (TUBING)
TUBING INSUFFLATION 10FT LAP (TUBING) ×3 IMPLANT

## 2017-06-07 NOTE — H&P (Signed)
The patient is a 76 year old female who presents with colorectal cancer. 76 year old female with a history of endometrial cancer treated with hysterectomy and radiation who presents to the office for evaluation of a sigmoid mass seen on colonoscopy. She had a positive cologaurd test which prompted the colonoscopy. A nonobstructing mass was seen approximately 20 cm from the anal verge. This was biopsied and tattooed. Biopsy showed adenocarcinoma. CT scan of the abdomen shows no sign of metastatic disease. CEA was normal. CT scan did show a supraumbilical hernia.   Past Surgical History [name], [place]; 05/05/2017 9:40 AM) Foot Surgery Right. Hysterectomy (due to cancer) - Complete Oral Surgery  Diagnostic Studies History [name], [place]; 05/05/2017 9:40 AM) Colonoscopy within last year Mammogram 1-3 years ago  Allergies [name], [place]; 05/05/2017 9:41 AM) Codeine Phosphate *ANALGESICS - OPIOID* Nausea Allergies Reconciled  Medication History [name], RMA; 05/05/2017 9:43 AM) Topiramate (50MG  Tablet, Oral) Active. Simvastatin (20MG  Tablet, Oral) Active. Metoprolol Succinate ER (100MG  Tablet ER 24HR, Oral) Active. Losartan Potassium (50MG  Tablet, Oral) Active. Flonase Allergy Relief (50MCG/ACT Suspension, Nasal) Active. Colace (100MG  Capsule, Oral) Active. Multiple Vitamin (Oral) Active.  Social History [name], [place]; 05/05/2017 9:40 AM) Alcohol use Occasional alcohol use. Caffeine use Coffee, Tea. No drug use Tobacco use Never smoker.  Family History [name], [place]; 05/05/2017 9:40 AM) Breast Cancer Family Members In General, Sister. Colon Cancer Mother. Colon Polyps Mother, Sister. Heart Disease Family Members In General, Father. Heart disease in female family member before age 30  Pregnancy / Birth History [name], [place]; 05/05/2017 9:40 AM) Age at menarche 64 years. Age of menopause 60-50 Contraceptive History Oral  contraceptives. Gravida 2 Maternal age 60-25 Para 2  Other Problems [name], [place]; 05/05/2017 9:40 AM) Arthritis Cancer Colon Cancer General anesthesia - complications High blood pressure Oophorectomy     Review of Systems General Present- Fatigue and Weight Gain. Not Present- Appetite Loss, Chills, Fever, Night Sweats and Weight Loss. Skin Present- Dryness. Not Present- Change in Wart/Mole, Hives, Jaundice, New Lesions, Non-Healing Wounds, Rash and Ulcer. HEENT Present- Hearing Loss and Wears glasses/contact lenses. Not Present- Earache, Hoarseness, Nose Bleed, Oral Ulcers, Ringing in the Ears, Seasonal Allergies, Sinus Pain, Sore Throat, Visual Disturbances and Yellow Eyes. Respiratory Present- Snoring. Not Present- Bloody sputum, Chronic Cough, Difficulty Breathing and Wheezing. Breast Not Present- Breast Mass, Breast Pain, Nipple Discharge and Skin Changes. Cardiovascular Not Present- Chest Pain, Difficulty Breathing Lying Down, Leg Cramps, Palpitations, Rapid Heart Rate, Shortness of Breath and Swelling of Extremities. Gastrointestinal Present- Bloating, Constipation, Excessive gas and Indigestion. Not Present- Abdominal Pain, Bloody Stool, Change in Bowel Habits, Chronic diarrhea, Difficulty Swallowing, Gets full quickly at meals, Hemorrhoids, Nausea, Rectal Pain and Vomiting. Female Genitourinary Not Present- Frequency, Nocturia, Painful Urination, Pelvic Pain and Urgency. Musculoskeletal Not Present- Back Pain, Joint Pain, Joint Stiffness, Muscle Pain, Muscle Weakness and Swelling of Extremities. Neurological Present- Tremor. Not Present- Decreased Memory, Fainting, Headaches, Numbness, Seizures, Tingling, Trouble walking and Weakness. Psychiatric Not Present- Anxiety, Bipolar, Change in Sleep Pattern, Depression, Fearful and Frequent crying. Endocrine Not Present- Cold Intolerance, Excessive Hunger, Hair Changes, Heat Intolerance, Hot flashes and New  Diabetes. Hematology Not Present- Blood Thinners, Easy Bruising, Excessive bleeding, Gland problems, HIV and Persistent Infections.  BP 139/67   Pulse 91   Temp 97.8 F (36.6 C) (Oral)   Resp 18   Ht 5\' 5"  (1.651 m)   Wt 100.2 kg (221 lb)   SpO2 97%   BMI 36.78 kg/m  Physical Exam  General Mental Status-Alert. General Appearance-Not in acute distress. Build & Nutrition-Well nourished. Posture-Normal posture. Gait-Normal.  Head and Neck Head-normocephalic, atraumatic with no lesions or palpable masses. Trachea-midline.  Chest and Lung Exam Chest and lung exam reveals -on auscultation, normal breath sounds, no adventitious sounds and normal vocal resonance.  Cardiovascular Cardiovascular examination reveals -normal heart sounds, regular rate and rhythm with no murmurs and no digital clubbing, cyanosis, edema, increased warmth or tenderness.  Abdomen Inspection Hernias - Incisional - Reducible. Note: Supraumbilical. Palpation/Percussion Palpation and Percussion of the abdomen reveal - Soft, Non Tender, No Rigidity (guarding), No hepatosplenomegaly and No Palpable abdominal masses.  Neurologic Neurologic evaluation reveals -alert and oriented x 3 with no impairment of recent or remote memory, normal attention span and ability to concentrate, normal sensation and normal coordination.  Musculoskeletal Normal Exam - Bilateral-Upper Extremity Strength Normal and Lower Extremity Strength Normal.    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 This appears to be a distal sigmoid mass on CT scan. She does have a history of radiation to the pelvis. I have recommended a robotic partial colectomy with possible low anterior resection and possible bladder wall resection. We discussed the possibility of problems with healing due to her radiation but I do  not think that this would necessitate a diverting ostomy. We will plan on doing a primary repair of her incisional hernia at the same time as her surgery. The surgery and anatomy were described to the patient as well as the risks of surgery and the possible complications. These include: Bleeding, deep abdominal infections and possible wound complications such as hernia and infection, damage to adjacent structures, leak of surgical connections, which can lead to other surgeries and possibly an ostomy, possible need for other procedures, such as abscess drains in radiology, possible prolonged hospital stay, possible diarrhea from removal of part of the colon, possible constipation from narcotics, possible bowel, bladder or sexual dysfunction if having rectal surgery, prolonged fatigue/weakness or appetite loss, possible early recurrence of of disease, possible complications of their medical problems such as heart disease or arrhythmias or lung problems, death (less than 1%). I believe the patient understands and wishes to proceed with the surgery.

## 2017-06-07 NOTE — Anesthesia Preprocedure Evaluation (Addendum)
Anesthesia Evaluation  Patient identified by MRN, date of birth, ID band Patient awake    Reviewed: Allergy & Precautions, NPO status , Patient's Chart, lab work & pertinent test results  History of Anesthesia Complications (+) PONV  Airway Mallampati: II  TM Distance: >3 FB Neck ROM: Full    Dental  (+) Dental Advisory Given, Chipped   Pulmonary neg pulmonary ROS,    breath sounds clear to auscultation       Cardiovascular hypertension, Pt. on medications and Pt. on home beta blockers (-) angina Rhythm:Regular Rate:Normal     Neuro/Psych negative neurological ROS     GI/Hepatic Neg liver ROS, GERD  Controlled,Colon cancer   Endo/Other  negative endocrine ROSMorbid obesity  Renal/GU negative Renal ROS     Musculoskeletal  (+) Arthritis , Osteoarthritis,    Abdominal (+) + obese,   Peds  Hematology negative hematology ROS (+)   Anesthesia Other Findings H/o endometrial cancer  Reproductive/Obstetrics                            Anesthesia Physical Anesthesia Plan  ASA: III  Anesthesia Plan: General   Post-op Pain Management:    Induction: Intravenous  PONV Risk Score and Plan: 4 or greater and Ondansetron, Dexamethasone, Midazolam and Scopolamine patch - Pre-op  Airway Management Planned: Oral ETT  Additional Equipment:   Intra-op Plan:   Post-operative Plan: Extubation in OR  Informed Consent: I have reviewed the patients History and Physical, chart, labs and discussed the procedure including the risks, benefits and alternatives for the proposed anesthesia with the patient or authorized representative who has indicated his/her understanding and acceptance.   Dental advisory given  Plan Discussed with: CRNA and Surgeon  Anesthesia Plan Comments: (Plan routine monitors, GETA)        Anesthesia Quick Evaluation

## 2017-06-07 NOTE — Anesthesia Procedure Notes (Addendum)
Procedure Name: Intubation Date/Time: 06/07/2017 1:50 PM Performed by: Lissa Morales Pre-anesthesia Checklist: Patient identified, Emergency Drugs available, Suction available and Patient being monitored Patient Re-evaluated:Patient Re-evaluated prior to induction Oxygen Delivery Method: Circle system utilized Preoxygenation: Pre-oxygenation with 100% oxygen Induction Type: IV induction Ventilation: Mask ventilation without difficulty Laryngoscope Size: Miller and 2 Grade View: Grade I Tube type: Oral Number of attempts: 2 Airway Equipment and Method: Stylet and Oral airway Placement Confirmation: ETT inserted through vocal cords under direct vision,  positive ETCO2 and breath sounds checked- equal and bilateral Secured at: 22 cm Tube secured with: Tape Dental Injury: Teeth and Oropharynx as per pre-operative assessment  Comments: First attempt with MAC 3 GrII  view.  Second attempt with Mil 2 successful Grade 1 view.

## 2017-06-07 NOTE — Transfer of Care (Signed)
Immediate Anesthesia Transfer of Care Note  Patient: [name]  Procedure(s) Performed: Procedure(s): XI ROBOT  SIGMOIDECTOMY (N/A) ROBOTIC INCISIONAL HERNIA REPAIR (N/A)  Patient Location: PACU  Anesthesia Type:General  Level of Consciousness: awake, alert , oriented and patient cooperative  Airway & Oxygen Therapy: Patient Spontanous Breathing and Patient connected to face mask oxygen  Post-op Assessment: Report given to RN, Post -op Vital signs reviewed and stable and Patient moving all extremities X 4  Post vital signs: stable  Last Vitals:  Vitals:   06/07/17 1655 06/07/17 1700  BP: (!) 172/85 (!) 182/90  Pulse: 74 79  Resp: (!) 7 10  Temp: (!) 36.3 C     Last Pain:  Vitals:   06/07/17 1655  TempSrc:   PainSc: 0-No pain      Patients Stated Pain Goal: 3 (75/05/18 3358)  Complications: No apparent anesthesia complications

## 2017-06-07 NOTE — Op Note (Signed)
06/07/2017  4:41 PM  PATIENT:  [name]  76 y.o. female  Patient Care Team: Burnard Hawthorne, FNP as PCP - General (Family Medicine) Vladimir Crofts, MD (Neurology) Noreene Filbert, MD as Referring Physician (Radiation Oncology)  PRE-OPERATIVE DIAGNOSIS:  colon cancer, incisional hernia  POST-OPERATIVE DIAGNOSIS:  colon cancer, incisional hernia  PROCEDURE:   XI ROBOT  SIGMOIDECTOMY INCISIONAL HERNIA REPAIR   Surgeon(s): Leighton Ruff, MD Michael Boston, MD  ASSISTANT: Dr Johney Maine   ANESTHESIA:   local and general  EBL: 110ml Total I/O In: 2000 [I.V.:2000] Out: 140 [Urine:40; Blood:100]  Delay start of Pharmacological VTE agent (>24hrs) due to surgical blood loss or risk of bleeding:  no  DRAINS: none   SPECIMEN:  Source of Specimen:  Sigmoid colon  DISPOSITION OF SPECIMEN:  PATHOLOGY  COUNTS:  YES  PLAN OF CARE: Admit to inpatient   PATIENT DISPOSITION:  PACU - hemodynamically stable.  INDICATION:    76 year old female who presents to the office for evaluation of a colon cancer.  I recommended segmental resection:  The anatomy & physiology of the digestive tract was discussed.  The pathophysiology was discussed.  Natural history risks without surgery was discussed.   I worked to give an overview of the disease and the frequent need to have multispecialty involvement.  I feel the risks of no intervention will lead to serious problems that outweigh the operative risks; therefore, I recommended a partial colectomy to remove the pathology.  Laparoscopic & open techniques were discussed.   Risks such as bleeding, infection, abscess, leak, reoperation, possible ostomy, hernia, heart attack, death, and other risks were discussed.  I noted a good likelihood this will help address the problem.   Goals of post-operative recovery were discussed as well.    The patient expressed understanding & wished to proceed with surgery.  OR FINDINGS:   Patient had a proximal and  distal sigmoid mass both with tattoo.  No obvious metastatic disease on visceral parietal peritoneum or liver.  The anastomosis rests 13 cm from the anal verge by rigid proctoscopy.  DESCRIPTION:   Informed consent was confirmed.  The patient underwent general anaesthesia without difficulty.  The patient was positioned appropriately.  VTE prevention in place.  The patient's abdomen was clipped, prepped, & draped in a sterile fashion.  Surgical timeout confirmed our plan.  The patient was positioned in reverse Trendelenburg.  Abdominal entry was gained using a Varies needle in the left upper quadrant.  Entry was clean.  I induced carbon dioxide insufflation.  I placed an 8 mm robotic port in the LUQ. Camera inspection revealed no injury.  Extra ports were carefully placed under direct laparoscopic visualization.  The robot was done to the patient's left side. Instrument were placed under direct visualization.   I reflected the greater omentum and the upper abdomen the small bowel in the upper abdomen. I scored the base of peritoneum of the right side of the mesentery of the left colon from the ligament of Treitz to the peritoneal reflection of the mid rectum.  The patient had 2 masses in the sigmoid colon both marked with tattoo. The patient's tumor was adherent to the left pelvic sidewall posterior to the bladder. I divided the peritoneum approximately 1 cm around the tumor edge and continued dividing the subcutaneous fat underneath the peritoneum. I identified the left ureter at the junction into the bladder. This was left out of the way. I continued dissection until I reach the posterior peritoneum.  This allowed me to take the entire specimen en bloc.   I then elevated the sigmoid mesentery and enetered into the retro-mesenteric plane. We were able to identify the left ureter and gonadal vessels. We kept those posterior within the retroperitoneum and elevated the left colon mesentery off that. I did  isolated IMA pedicle but did not ligate it yet.  I continued distally and got into the avascular plane posterior to the mesorectum. This allowed me to help mobilize the rectum as well by freeing the mesorectum off the sacrum.  I mobilized the peritoneal coverings towards the peritoneal reflection on both the right and left sides of the rectum.  I could see the right and left ureters and stayed away from them.    I skeletonized the inferior mesenteric artery pedicle.  I went down to its takeoff from the aorta. After confirming the left ureter was out of the way, I went ahead and ligated the inferior mesenteric artery pedicle with bipolar robotic vessel sealer ~2cm above its takeoff from the aorta.   We ensured hemostasis. I skeletonized the mesorectum at the junction at the proximal rectum using blunt dissection & bipolar EnSeal.  I mobilized the left colon in a lateral to medial fashion off the line of Toldt up towards the splenic flexure to ensure good mobilization of the left colon to reach into the pelvis.  I divided the remaining mesentery using the robotic vessel sealer to the level of approximately 5 cm proximal to the most proximal tumor.  The robot was then undocked. I made an incision around the previous incisional scar in the upper midline using a 10 blade scalpel. Dissection was carried down to the hernia sac using electrocautery. The hernia sac was divided off of the fascia. [place] wound protector was placed. The dissected colon was removed.   The pursestring device was placed on the proximal dissection margin. A 2-0 Prolene for string was used. A 33 mm EEA anvil was then placed into the colon and the pursestring was tied tightly around this. This was placed back into the abdomen and anastomosis was created under laparoscopic visualization. There was no tension on the anastomosis. There was no leak when tested with insufflation underwater. The anastomosis rests approximately 13 cm from the anal  verge.   We then switched to clean gowns, gloves, drapes and instruments. The hernia site fascia was mobilized and the subcutaneous tissue was divided using electrocautery. The fascia was then closed using #1 Novafil interrupted sutures. The subcutaneous tissue was then reapproximated using interrupted 2-0 Vicryl sutures in layers. The skin was then closed with a running 4-0 Vicryl subcuticular suture. The 12 mm port site was closed using a 0 Vicryl suture for the fascia and the remaining port site skin was closed using interrupted 4-0 Vicryl sutures. Dermabond was placed over the port sites and a sterile dressing was placed over the midline wound. The patient was then awakened from anesthesia and sent to the postanesthesia care unit in stable condition. All counts were correct per operating room staff.

## 2017-06-07 NOTE — Anesthesia Postprocedure Evaluation (Signed)
Anesthesia Post Note  Patient: [name]  Procedure(s) Performed: Procedure(s) (LRB): XI ROBOT  SIGMOIDECTOMY (N/A) ROBOTIC INCISIONAL HERNIA REPAIR (N/A)     Patient location during evaluation: PACU Anesthesia Type: General Level of consciousness: awake and alert, patient cooperative and oriented Pain management: pain level controlled Vital Signs Assessment: post-procedure vital signs reviewed and stable Respiratory status: spontaneous breathing, nonlabored ventilation, respiratory function stable and patient connected to nasal cannula oxygen Cardiovascular status: blood pressure returned to baseline and stable : nausea improving. Anesthetic complications: no    Last Vitals:  Vitals:   06/07/17 1730 06/07/17 1745  BP: (!) 176/80 (!) 163/83  Pulse: 77 70  Resp: 20 11  Temp:  (!) 36.3 C    Last Pain:  Vitals:   06/07/17 1745  TempSrc:   PainSc: 0-No pain                 Alette Kataoka,E. Enes Rokosz

## 2017-06-08 ENCOUNTER — Encounter (HOSPITAL_COMMUNITY): Payer: Self-pay | Admitting: General Surgery

## 2017-06-08 LAB — CBC
HEMATOCRIT: 34.3 % — AB (ref 36.0–46.0)
HEMOGLOBIN: 11.1 g/dL — AB (ref 12.0–15.0)
MCH: 27.6 pg (ref 26.0–34.0)
MCHC: 32.4 g/dL (ref 30.0–36.0)
MCV: 85.3 fL (ref 78.0–100.0)
Platelets: 193 10*3/uL (ref 150–400)
RBC: 4.02 MIL/uL (ref 3.87–5.11)
RDW: 13.8 % (ref 11.5–15.5)
WBC: 10.2 10*3/uL (ref 4.0–10.5)

## 2017-06-08 LAB — BASIC METABOLIC PANEL
ANION GAP: 9 (ref 5–15)
BUN: 9 mg/dL (ref 6–20)
CALCIUM: 8.7 mg/dL — AB (ref 8.9–10.3)
CO2: 19 mmol/L — AB (ref 22–32)
Chloride: 113 mmol/L — ABNORMAL HIGH (ref 101–111)
Creatinine, Ser: 0.85 mg/dL (ref 0.44–1.00)
GFR calc Af Amer: >60 mL/min (ref >60)
GFR calc non Af Amer: >60 mL/min (ref >60)
GLUCOSE: 175 mg/dL — AB (ref 65–99)
Potassium: 4.3 mmol/L (ref 3.5–5.1)
SODIUM: 141 mmol/L (ref 135–145)

## 2017-06-08 MED ORDER — MENTHOL 3 MG MT LOZG
1.0000 | LOZENGE | OROMUCOSAL | Status: DC | PRN
  Administered 2017-06-08: 3 mg via ORAL
  Filled 2017-06-08: qty 9

## 2017-06-08 MED ORDER — ACETAMINOPHEN 500 MG PO TABS
1000.0000 mg | ORAL_TABLET | Freq: Four times a day (QID) | ORAL | Status: DC
  Administered 2017-06-08 – 2017-06-11 (×9): 1000 mg via ORAL
  Filled 2017-06-08 (×10): qty 2

## 2017-06-08 NOTE — Progress Notes (Signed)
Spoke with pharmacy about pt 100mg  dose of topamax given tonight. Pt agreed to take the 100mg , about 10 minutes later called the nurse back in to state she only should have had 50mg  and that her normal dose is 50mg  twice daily. Pharmacy stated they would get more clarification on the issue.

## 2017-06-08 NOTE — Progress Notes (Signed)
1 Day Post-Op robotic sigmoidectomy Subjective: Doing well.  Some nausea last night.  None this AM  Objective: Vital signs in last 24 hours: Temp:  [97.3 F (36.3 C)-98.2 F (36.8 C)] 97.6 F (36.4 C) (07/26 0542) Pulse Rate:  [47-91] 49 (07/26 0542) Resp:  [7-20] 16 (07/26 0542) BP: (133-182)/(49-97) 134/51 (07/26 0542) SpO2:  [97 %-100 %] 98 % (07/26 0542) Weight:  [100.2 kg (221 lb)] 100.2 kg (221 lb) (07/25 1136)   Intake/Output from previous day: 07/25 0701 - 07/26 0700 In: 4508.8 [I.V.:4508.8] Out: 2140 [Urine:2040; Blood:100] Intake/Output this shift: No intake/output data recorded.   General appearance: alert and cooperative GI: normal findings: soft, non-tender and non-distended  Incision: no significant drainage  Lab Results:   Recent Labs  06/08/17 0525  WBC 10.2  HGB 11.1*  HCT 34.3*  PLT 193   BMET  Recent Labs  06/08/17 0525  NA 141  K 4.3  CL 113*  CO2 19*  GLUCOSE 175*  BUN 9  CREATININE 0.85  CALCIUM 8.7*   PT/INR No results for input(s): LABPROT, INR in the last 72 hours. ABG No results for input(s): PHART, HCO3 in the last 72 hours.  Invalid input(s): PCO2, PO2  MEDS, Scheduled . acetaminophen  1,000 mg Oral Q6H  . alvimopan  12 mg Oral BID  . enoxaparin (LOVENOX) injection  40 mg Subcutaneous Q24H  . losartan  50 mg Oral Daily  . metoprolol succinate  100 mg Oral Daily  . topiramate  100 mg Oral BID    Studies/Results: No results found.  Assessment: s/p Procedure(s): XI ROBOT  SIGMOIDECTOMY ROBOTIC INCISIONAL HERNIA REPAIR Patient Active Problem List   Diagnosis Date Noted  . Colon cancer ([place]) 06/07/2017  . Abnormal stools   . Neoplasm of digestive system   . Bradycardia 02/14/2017  . HLD (hyperlipidemia) 02/13/2017  . Onychomycosis 12/29/2016  . Osteopenia 08/16/2016  . Macular degeneration 08/12/2015  . Routine general medical examination at a health care facility 09/26/2014  . Chronic constipation  05/27/2014  . Bartholin cyst 03/28/2014  . Endometrial cancer ([place]) 12/30/2013  . Essential hypertension, benign 04/17/2013  . Tremor 07/08/2011    Expected post op course  Plan: Advance diet to clears and then FLD as tolerated Cont ambulation Decrease MIV D/C foley   LOS: 1 day     .Rosario Adie, [facility], [place] [phone]   06/08/2017 7:30 AM

## 2017-06-08 NOTE — Evaluation (Signed)
Occupational Therapy Evaluation Patient Details Name: [name] MRN: [id number] DOB: 1941/04/16 Today's Date: 06/08/2017    History of Present Illness 76 yo female admitted with colon cancer. S/P sigmoidectomy, incisional hernia repair 7/25   Clinical Impression   Pt was admitted for the above. She is independent at baseline and lives alone.  Pt currently needs min guard for transfers and up to max A for LB adls. She will benefit from continued OT in acute as well as post acute OT to restore independence with adls and iadls. Goals in acute are for supervision level    Follow Up Recommendations  SNF (if pt doesn't quality/HHOT and aide)    Equipment Recommendations   (to be further assessed)    Recommendations for Other Services       Precautions / Restrictions Precautions Precautions: Fall Precaution Comments: abd surgery, abd binder Restrictions Weight Bearing Restrictions: No      Mobility Bed Mobility Overal bed mobility: Needs Assistance Bed Mobility: Rolling;Sidelying to Sit Rolling: Min guard Sidelying to sit: Min guard       General bed mobility comments: pt used rails and asked OT not to assist. Would do better with a little help as she has soreness after moving  Transfers Overall transfer level: Needs assistance Equipment used: None Transfers: Sit to/from [facility] Sit to Stand: Min guard;From elevated surface Stand pivot transfers: Min guard       General transfer comment: for safety    Balance                                           ADL either performed or assessed with clinical judgement   ADL Overall ADL's : Needs assistance/impaired     Grooming: Set up;Sitting   Upper Body Bathing: Set up;Sitting   Lower Body Bathing: Moderate assistance;Sit to/from stand   Upper Body Dressing : Set up;Sitting   Lower Body Dressing: Maximal assistance;Sit to/from stand   Toilet Transfer: Minimal  assistance;Stand-pivot (to recliner)             General ADL Comments: pt has catheter in place. Educated on AE, but pt did not use. She is hopeful that she will be able to comfortably perform ADLs without AE.       Vision         Perception     Praxis      Pertinent Vitals/Pain Pain Assessment: 0-10 Pain Score: 3  Faces Pain Scale: Hurts little more Pain Location: abdomen Pain Descriptors / Indicators: Sore;Aching Pain Intervention(s): Limited activity within patient's tolerance;Monitored during session;Repositioned;Patient requesting pain meds-RN notified     Hand Dominance     Extremity/Trunk Assessment Upper Extremity Assessment Upper Extremity Assessment: Generalized weakness      Cervical / Trunk Assessment Cervical / Trunk Assessment: Normal   Communication Communication Communication: No difficulties   Cognition Arousal/Alertness: Awake/alert Behavior During Therapy: WFL for tasks assessed/performed Overall Cognitive Status: Within Functional Limits for tasks assessed                                     General Comments       Exercises     Shoulder Instructions      Home Living Family/patient expects to be discharged to:: Unsure Living Arrangements: Alone  Additional Comments: pt has a tub and regular commode with vanity next to it      Prior Functioning/Environment Level of Independence: Independent                 OT Problem List: Decreased strength;Decreased activity tolerance;Decreased knowledge of use of DME or AE;Decreased knowledge of precautions;Pain      OT Treatment/Interventions: Self-care/ADL training;Patient/family education;DME and/or AE instruction;Energy conservation    OT Goals(Current goals can be found in the care plan section) Acute Rehab OT Goals Patient Stated Goal: to regain PLOF/independence OT Goal Formulation: With patient Time For Goal  Achievement: 06/15/17 Potential to Achieve Goals: Good ADL Goals Pt Will Perform Lower Body Bathing: with supervision;with adaptive equipment;sit to/from stand Pt Will Perform Lower Body Dressing: with supervision;with adaptive equipment;sit to/from stand Pt Will Transfer to Toilet: with supervision;ambulating;regular height toilet;bedside commode (vs) Additional ADL Goal #1: pt will perform bed mobility at supervison level in preparation for adls  OT Frequency: Min 2X/week   Barriers to D/C:            Co-evaluation              AM-PAC PT "6 Clicks" Daily Activity     Outcome Measure Help from another person eating meals?: None Help from another person taking care of personal grooming?: A Little Help from another person toileting, which includes using toliet, bedpan, or urinal?: A Little Help from another person bathing (including washing, rinsing, drying)?: A Lot Help from another person to put on and taking off regular upper body clothing?: A Little Help from another person to put on and taking off regular lower body clothing?: A Lot 6 Click Score: 17   End of Session Nurse Communication: Patient requests pain meds  Activity Tolerance: Patient tolerated treatment well Patient left: in chair;with call bell/phone within reach  OT Visit Diagnosis: Muscle weakness (generalized) (M62.81)                Time: 2423-5361 OT Time Calculation (min): 24 min Charges:  OT General Charges $OT Visit: 1 Procedure OT Evaluation $OT Eval Low Complexity: 1 Procedure OT Treatments $Self Care/Home Management : 8-22 mins G-Codes:     [place], OTR/L [phone] 06/08/2017  [name] 06/08/2017, 2:52 PM

## 2017-06-08 NOTE — Evaluation (Signed)
Physical Therapy Evaluation Patient Details Name: [name] MRN: [id number] DOB: [birth year] Today's Date: 06/08/2017   History of Present Illness  76 yo female admitted with colon cancer. S/P sigmoidectomy, incisional hernia repair 7/25  Clinical Impression  On eval, pt required Min assist for mobility. Pt had most difficulty with bed mobility. She walked ~1000 feet with a RW. Some pain reported with mobility. She is very motivated to regain PLOF. Unsure if pt will qualify for ST SNF. At this time, will recommend HHPT and a home health aide to help with ADLs. Will continue to assess and progress activity as tolerated. Encouraged pt to walk often with nursing. Placed OT consult to eval/tx pt in the event she returns home after hospital stay.     Follow Up Recommendations Home health PT; Home Health Aide    Equipment Recommendations  Rolling walker with 5" wheels (possibly-continuing to assess)    Recommendations for Other Services       Precautions / Restrictions Precautions Precautions: Fall Precaution Comments: abd surgery, abd binder Restrictions Weight Bearing Restrictions: No      Mobility  Bed Mobility Overal bed mobility: Needs Assistance Bed Mobility: Rolling;Sidelying to Sit Rolling: Min assist Sidelying to sit: Min assist       General bed mobility comments: Cues for logroll technique from flat bed. Assist for trunk and LEs. Increased time.   Transfers Overall transfer level: Needs assistance Equipment used: Rolling walker (2 wheeled) Transfers: Sit to/from Stand Sit to Stand: Min guard;From elevated surface         General transfer comment: close guard for safety. VCs safety, hand placement.   Ambulation/Gait Ambulation/Gait assistance: Min guard Ambulation Distance (Feet): 1000 Feet Assistive device: Rolling walker (2 wheeled) Gait Pattern/deviations: Step-through pattern;Trunk flexed     General Gait Details: Cues for posture and distance from  RW. Pt is very motivated to ambulate. She tolerated distance well. Dyspnea 2/4. O2 sats >90% on RA  Stairs            Wheelchair Mobility    Modified Rankin (Stroke Patients Only)       Balance                                             Pertinent Vitals/Pain Pain Assessment: Faces Faces Pain Scale: Hurts little more Pain Location: abdomen with mobility Pain Descriptors / Indicators: Sore;Aching Pain Intervention(s): Monitored during session;Repositioned    Home Living Family/patient expects to be discharged to:: Private residence Living Arrangements: Alone   Type of Home: Apartment Home Access: Level entry     Home Layout: One level Home Equipment: None      Prior Function Level of Independence: Independent               Hand Dominance        Extremity/Trunk Assessment   Upper Extremity Assessment Upper Extremity Assessment: Generalized weakness    Lower Extremity Assessment Lower Extremity Assessment: Generalized weakness    Cervical / Trunk Assessment Cervical / Trunk Assessment: Normal  Communication   Communication: No difficulties  Cognition Arousal/Alertness: Awake/alert Behavior During Therapy: WFL for tasks assessed/performed Overall Cognitive Status: Within Functional Limits for tasks assessed  General Comments      Exercises     Assessment/Plan    PT Assessment Patient needs continued PT services  PT Problem List Decreased strength;Decreased mobility;Decreased activity tolerance;Decreased knowledge of use of DME;Pain       PT Treatment Interventions DME instruction;Gait training;Therapeutic activities;Therapeutic exercise;Patient/family education;Balance training;Functional mobility training    PT Goals (Current goals can be found in the Care Plan section)  Acute Rehab PT Goals Patient Stated Goal: to regain PLOF/independence PT Goal  Formulation: With patient Time For Goal Achievement: 06/22/17 Potential to Achieve Goals: Good    Frequency Min 3X/week   Barriers to discharge        Co-evaluation               AM-PAC PT "6 Clicks" Daily Activity  Outcome Measure Difficulty turning over in bed (including adjusting bedclothes, sheets and blankets)?: Total Difficulty moving from lying on back to sitting on the side of the bed? : Total Difficulty sitting down on and standing up from a chair with arms (e.g., wheelchair, bedside commode, etc,.)?: A Little Help needed moving to and from a bed to chair (including a wheelchair)?: A Little Help needed walking in hospital room?: A Little Help needed climbing 3-5 steps with a railing? : A Little 6 Click Score: 14    End of Session   Activity Tolerance: Patient tolerated treatment well Patient left: in chair;with call bell/phone within reach;with family/visitor present   PT Visit Diagnosis: Muscle weakness (generalized) (M62.81);Difficulty in walking, not elsewhere classified (R26.2)    Time: 4818-5631 PT Time Calculation (min) (ACUTE ONLY): 25 min   Charges:   PT Evaluation $PT Eval Low Complexity: 1 Procedure PT Treatments $Gait Training: 8-22 mins   PT G Codes:          Weston Anna, MPT Pager: [phone]

## 2017-06-09 LAB — CBC
HCT: 32.8 % — ABNORMAL LOW (ref 36.0–46.0)
Hemoglobin: 10.5 g/dL — ABNORMAL LOW (ref 12.0–15.0)
MCH: 27.9 pg (ref 26.0–34.0)
MCHC: 32 g/dL (ref 30.0–36.0)
MCV: 87 fL (ref 78.0–100.0)
Platelets: 216 10*3/uL (ref 150–400)
RBC: 3.77 MIL/uL — ABNORMAL LOW (ref 3.87–5.11)
RDW: 14.4 % (ref 11.5–15.5)
WBC: 7.8 10*3/uL (ref 4.0–10.5)

## 2017-06-09 LAB — BASIC METABOLIC PANEL
ANION GAP: 6 (ref 5–15)
CALCIUM: 8.6 mg/dL — AB (ref 8.9–10.3)
CO2: 24 mmol/L (ref 22–32)
Chloride: 113 mmol/L — ABNORMAL HIGH (ref 101–111)
Creatinine, Ser: 0.86 mg/dL (ref 0.44–1.00)
GFR calc Af Amer: >60 mL/min (ref >60)
GLUCOSE: 110 mg/dL — AB (ref 65–99)
Potassium: 3.8 mmol/L (ref 3.5–5.1)
SODIUM: 143 mmol/L (ref 135–145)

## 2017-06-09 MED ORDER — TRAMADOL HCL 50 MG PO TABS
50.0000 mg | ORAL_TABLET | Freq: Four times a day (QID) | ORAL | Status: DC | PRN
  Filled 2017-06-09: qty 1

## 2017-06-09 MED ORDER — TOPIRAMATE 25 MG PO TABS
50.0000 mg | ORAL_TABLET | Freq: Two times a day (BID) | ORAL | Status: DC
  Administered 2017-06-09 – 2017-06-11 (×4): 50 mg via ORAL
  Filled 2017-06-09 (×4): qty 2

## 2017-06-09 NOTE — Progress Notes (Signed)
2 Days Post-Op robotic sigmoidectomy Subjective: Doing well.  Tolerating fulls.  Having flatus.  Objective: Vital signs in last 24 hours: Temp:  [97.6 F (36.4 C)-98.5 F (36.9 C)] 97.6 F (36.4 C) (07/27 0611) Pulse Rate:  [48-64] 64 (07/27 0611) Resp:  [16-18] 18 (07/27 0611) BP: (114-143)/(43-71) 143/43 (07/27 0611) SpO2:  [99 %-100 %] 100 % (07/27 0611)   Intake/Output from previous day: 07/26 0701 - 07/27 0700 In: 2870 [P.O.:1920; I.V.:950] Out: 3850 [Urine:3850] Intake/Output this shift: Total I/O In: 600 [P.O.:600] Out: 0    General appearance: alert and cooperative GI: normal findings: soft, non-tender and non-distended  Incision: no significant drainage  Lab Results:   Recent Labs  06/08/17 0525 06/09/17 0450  WBC 10.2 7.8  HGB 11.1* 10.5*  HCT 34.3* 32.8*  PLT 193 216   BMET  Recent Labs  06/08/17 0525 06/09/17 0450  NA 141 143  K 4.3 3.8  CL 113* 113*  CO2 19* 24  GLUCOSE 175* 110*  BUN 9 <5*  CREATININE 0.85 0.86  CALCIUM 8.7* 8.6*   PT/INR No results for input(s): LABPROT, INR in the last 72 hours. ABG No results for input(s): PHART, HCO3 in the last 72 hours.  Invalid input(s): PCO2, PO2  MEDS, Scheduled . acetaminophen  1,000 mg Oral Q6H  . alvimopan  12 mg Oral BID  . enoxaparin (LOVENOX) injection  40 mg Subcutaneous Q24H  . losartan  50 mg Oral Daily  . metoprolol succinate  100 mg Oral Daily  . topiramate  50 mg Oral BID    Studies/Results: No results found.  Assessment: s/p Procedure(s): XI ROBOT  SIGMOIDECTOMY ROBOTIC INCISIONAL HERNIA REPAIR Patient Active Problem List   Diagnosis Date Noted  . Colon cancer ([place]) 06/07/2017  . Abnormal stools   . Neoplasm of digestive system   . Bradycardia 02/14/2017  . HLD (hyperlipidemia) 02/13/2017  . Onychomycosis 12/29/2016  . Osteopenia 08/16/2016  . Macular degeneration 08/12/2015  . Routine general medical examination at a health care facility 09/26/2014  .  Chronic constipation 05/27/2014  . Bartholin cyst 03/28/2014  . Endometrial cancer ([place]) 12/30/2013  . Essential hypertension, benign 04/17/2013  . Tremor 07/08/2011    Expected post op course  Plan: Advance diet to soft foods Cont ambulation SL MIV    LOS: 2 days     .Rosario Adie, [facility], [place]   06/09/2017 10:44 AM

## 2017-06-09 NOTE — Discharge Instructions (Signed)

## 2017-06-09 NOTE — Progress Notes (Addendum)
Physical Therapy Treatment Patient Details Name: [name] MRN: [id number] DOB: [birth year] Today's Date: 06/09/2017    History of Present Illness 76 yo female admitted with colon cancer. S/P sigmoidectomy, incisional hernia repair 7/25    PT Comments    Progressing with mobility. Pt is upset that she doesn't qualify for ST rehab. Recommend home health aide, if possible, and HHPT f/u.    Follow Up Recommendations  Home health PT;Supervision - Intermittent; Home health aide (per pt, she does not quality for SNF)     Equipment Recommendations  Rolling walker with 5" wheels (if pt decides she wants to use it)    Recommendations for Other Services       Precautions / Restrictions Precautions Precautions: Fall Precaution Comments: abd surgery, abd binder Restrictions Weight Bearing Restrictions: No    Mobility  Bed Mobility Overal bed mobility: Needs Assistance Bed Mobility: Sit to Supine     Sit to supine: Supervision   General bed mobility comments: Pt declined to use logroll technique for getting back into bed. She preferred to swing legs onto bed into long sitting position.   Transfers Overall transfer level: Needs assistance Equipment used: Rolling walker (2 wheeled) Transfers: Sit to/from Stand Sit to Stand: Supervision         General transfer comment: for safety  Ambulation/Gait Ambulation/Gait assistance: Min guard Ambulation Distance (Feet): 450 Feet Assistive device:  (IV pole) Gait Pattern/deviations: Step-through pattern;Decreased stride length     General Gait Details: After walking ~25 feet, pt requested to walk without the RW. Close guard for safety. Dyspnea 2/4.    Stairs            Wheelchair Mobility    Modified Rankin (Stroke Patients Only)       Balance                                            Cognition Arousal/Alertness: Awake/alert Behavior During Therapy: WFL for tasks assessed/performed Overall  Cognitive Status: Within Functional Limits for tasks assessed                                        Exercises      General Comments        Pertinent Vitals/Pain Pain Assessment: 0-10 Pain Score: 4  Pain Location: abdomen Pain Descriptors / Indicators: Sore;Aching Pain Intervention(s): Patient requesting pain meds-RN notified;Monitored during session    Home Living                      Prior Function            PT Goals (current goals can now be found in the care plan section) Progress towards PT goals: Progressing toward goals    Frequency    Min 3X/week      PT Plan Current plan remains appropriate    Co-evaluation              AM-PAC PT "6 Clicks" Daily Activity  Outcome Measure  Difficulty turning over in bed (including adjusting bedclothes, sheets and blankets)?: A Little Difficulty moving from lying on back to sitting on the side of the bed? : A Little Difficulty sitting down on and standing up from a chair with arms (e.g., wheelchair, bedside commode,  etc,.)?: A Little Help needed moving to and from a bed to chair (including a wheelchair)?: A Little Help needed walking in hospital room?: A Little Help needed climbing 3-5 steps with a railing? : A Little 6 Click Score: 18    End of Session   Activity Tolerance: Patient tolerated treatment well Patient left: in bed;with call bell/phone within reach;with nursing/sitter in room   PT Visit Diagnosis: Muscle weakness (generalized) (M62.81);Difficulty in walking, not elsewhere classified (R26.2)     Time: 2767-0110 PT Time Calculation (min) (ACUTE ONLY): 23 min  Charges:  $Gait Training: 23-37 mins                    G Codes:          Weston Anna, MPT Pager: [phone]

## 2017-06-09 NOTE — Progress Notes (Signed)
Dr. Hulen Skains back and said he doesn't  wants to restart simvastatin tonight.  Patient was informed.  And she was not happy about that.

## 2017-06-09 NOTE — Progress Notes (Signed)
rm [place], s/p  sigmoidectomy and hernia repair POD#2. on PO's already, Wants to restart her simvastatin 20mg  at night. Thanks.

## 2017-06-09 NOTE — Progress Notes (Signed)
Following pt due to spiritual care consult.   Pt on phone when chaplain rounded on pt.  Will return to meet with patient at a more convenient time for pt.

## 2017-06-09 NOTE — Progress Notes (Addendum)
Occupational Therapy Treatment Patient Details Name: REDINA ZELLER MRN: [id number] DOB: December 30, 1940 Today's Date: 06/09/2017    History of present illness 76 yo female admitted with colon cancer. S/P sigmoidectomy, incisional hernia repair 7/25   OT comments  Pt improving. She is very independent and was able to don underwear without AD.  Follow Up Recommendations  Home health OT    Equipment Recommendations  None recommended by OT    Recommendations for Other Services      Precautions / Restrictions Precautions Precautions: Fall Precaution Comments: abd surgery, abd binder Restrictions Weight Bearing Restrictions: No       Mobility Bed Mobility     Rolling: Min guard Sidelying to sit: Min guard       General bed mobility comments: pt able to get up with extra time. Min guard as she was close to edge of bed  Transfers       Sit to Stand: Modified independent (Device/Increase time)              Balance                                           ADL either performed or assessed with clinical judgement   ADL                       Lower Body Dressing: Set up;Sit to/from stand   Toilet Transfer: Min guard;Transfer board;Comfort height toilet             General ADL Comments: pt did not want to use RW walking to bathroom. She was able to get onto/off of comfort height commode without difficulty;  she has a standard commode with a vanity next to it and feels she will be fine with this at home.  Did not need AE to don underwear this session.  Needed assistance to secure binder as doctor had loosened it while she was lying in bed     Vision       Perception     Praxis      Cognition Arousal/Alertness: Awake/alert Behavior During Therapy: WFL for tasks assessed/performed Overall Cognitive Status: Within Functional Limits for tasks assessed                                          Exercises      Shoulder Instructions       General Comments      Pertinent Vitals/ Pain       Pain Score: 2  Pain Location: abdomen Pain Descriptors / Indicators: Sore;Aching Pain Intervention(s): Limited activity within patient's tolerance;Monitored during session;Repositioned  Home Living                                          Prior Functioning/Environment              Frequency  Min 2X/week        Progress Toward Goals  OT Goals(current goals can now be found in the care plan section)  Progress towards OT goals: Progressing toward goals     Plan      Co-evaluation  AM-PAC PT "6 Clicks" Daily Activity     Outcome Measure   Help from another person eating meals?: None Help from another person taking care of personal grooming?: A Little Help from another person toileting, which includes using toliet, bedpan, or urinal?: A Little Help from another person bathing (including washing, rinsing, drying)?: A Little Help from another person to put on and taking off regular upper body clothing?: A Little Help from another person to put on and taking off regular lower body clothing?: A Little 6 Click Score: 19    End of Session        Activity Tolerance Patient tolerated treatment well   Patient Left in chair;with call bell/phone within reach   Nurse Communication          Time: 1115-1130 OT Time Calculation (min): 15 min  Charges: OT General Charges $OT Visit: 1 Procedure OT Treatments $Self Care/Home Management : 8-22 mins  Lesle Chris, OTR/L [phone] 06/09/2017   [place] 06/09/2017, 12:15 PM

## 2017-06-10 LAB — BASIC METABOLIC PANEL
Anion gap: 9 (ref 5–15)
BUN: 8 mg/dL (ref 6–20)
CHLORIDE: 111 mmol/L (ref 101–111)
CO2: 22 mmol/L (ref 22–32)
CREATININE: 0.78 mg/dL (ref 0.44–1.00)
Calcium: 8.8 mg/dL — ABNORMAL LOW (ref 8.9–10.3)
GFR calc Af Amer: >60 mL/min (ref >60)
GFR calc non Af Amer: >60 mL/min (ref >60)
GLUCOSE: 107 mg/dL — AB (ref 65–99)
Potassium: 3.8 mmol/L (ref 3.5–5.1)
SODIUM: 142 mmol/L (ref 135–145)

## 2017-06-10 LAB — CBC
HEMATOCRIT: 32.8 % — AB (ref 36.0–46.0)
Hemoglobin: 10.5 g/dL — ABNORMAL LOW (ref 12.0–15.0)
MCH: 27.9 pg (ref 26.0–34.0)
MCHC: 32 g/dL (ref 30.0–36.0)
MCV: 87.2 fL (ref 78.0–100.0)
PLATELETS: 229 10*3/uL (ref 150–400)
RBC: 3.76 MIL/uL — ABNORMAL LOW (ref 3.87–5.11)
RDW: 14.5 % (ref 11.5–15.5)
WBC: 7.4 10*3/uL (ref 4.0–10.5)

## 2017-06-10 NOTE — Progress Notes (Signed)
Occupational Therapy Treatment Patient Details Name: [name] MRN: [id number] DOB: Jan 10, 1941 Today's Date: 06/10/2017    History of present illness 76 yo female admitted with colon cancer. S/P sigmoidectomy, incisional hernia repair 7/25   OT comments  Pt is making progress in OT.  Visibly, I see fatique and unsteadiness when she comes out of the bathroom. She was unable to get up from a sidelying position without the use of the rail   Follow Up Recommendations  Home health OT;Supervision - Intermittent    Equipment Recommendations  None recommended by OT (pt doesn't want 3:1 commode)    Recommendations for Other Services      Precautions / Restrictions Precautions Precautions: Fall Precaution Comments: abd surgery, abd binder Restrictions Weight Bearing Restrictions: No       Mobility Bed Mobility       Sidelying to sit: Min guard (with rail)       General bed mobility comments: pt does not have the strength to push up on R arm to get up from sidelying.  Used bedrail, which she doesn't have  Transfers   Equipment used: None   Sit to Stand: Supervision              Balance                                           ADL either performed or assessed with clinical judgement   ADL                           Toilet Transfer: Supervision/safety;Min guard;Ambulation             General ADL Comments: educated on energy conservation. Pt will be going home alone with church friends bringing food.  Demonstrated sidestepping over tub. Recommended either someone be in the home with her or she should call them and tell them when she is getting in and again when she is out.  Pt has things placed at hip to head level for things she will need. She has thought about putting kitchen stool by her bedside to make getting in/out easier..  Pt much more fatiqued and unsteady walking out of bathroom     Vision       Perception      Praxis      Cognition Arousal/Alertness: Awake/alert Behavior During Therapy: WFL for tasks assessed/performed Overall Cognitive Status: Within Functional Limits for tasks assessed                                          Exercises     Shoulder Instructions       General Comments      Pertinent Vitals/ Pain       Pain Score: 2  Pain Location: abdomen Pain Intervention(s): Limited activity within patient's tolerance;Monitored during session  Home Living                                          Prior Functioning/Environment              Frequency  Progress Toward Goals  OT Goals(current goals can now be found in the care plan section)  Progress towards OT goals: Progressing toward goals  Acute Rehab OT Goals Patient Stated Goal: to regain PLOF/independence  Plan      Co-evaluation                 AM-PAC PT "6 Clicks" Daily Activity     Outcome Measure   Help from another person eating meals?: None Help from another person taking care of personal grooming?: A Little Help from another person toileting, which includes using toliet, bedpan, or urinal?: A Little Help from another person bathing (including washing, rinsing, drying)?: A Little Help from another person to put on and taking off regular upper body clothing?: A Little Help from another person to put on and taking off regular lower body clothing?: A Little 6 Click Score: 19    End of Session    OT Visit Diagnosis: Muscle weakness (generalized) (M62.81)   Activity Tolerance Patient tolerated treatment well   Patient Left in chair;with call bell/phone within reach   Nurse Communication          Time: 1447-1520 OT Time Calculation (min): 33 min  Charges: OT General Charges $OT Visit: 1 Procedure OT Treatments $Self Care/Home Management : 8-22 mins $Therapeutic Activity: 8-22 mins  Lesle Chris,  OTR/L [phone] 06/10/2017   [place] 06/10/2017, 3:39 PM

## 2017-06-10 NOTE — Progress Notes (Signed)
3 Days Post-Op   Subjective/Chief Complaint: No complaints. Seems to be progressing well. Concerned about going home as she lives alone   Objective: Vital signs in last 24 hours: Temp:  [97.6 F (36.4 C)-98.3 F (36.8 C)] 97.9 F (36.6 C) (07/28 0515) Pulse Rate:  [56-67] 67 (07/28 0515) Resp:  [16-18] 16 (07/28 0515) BP: (119-143)/(43-56) 125/51 (07/28 0515) SpO2:  [97 %-100 %] 97 % (07/28 0515) Last BM Date: 06/06/17  Intake/Output from previous day: 07/27 0701 - 07/28 0700 In: 1100 [P.O.:1100] Out: 2851 [Urine:2850; Stool:1] Intake/Output this shift: Total I/O In: -  Out: 1200 [Urine:1200]  General appearance: alert and cooperative Resp: clear to auscultation bilaterally Cardio: regular rate and rhythm GI: soft, minimal tenderness. having bm's  Lab Results:   Recent Labs  06/09/17 0450 06/10/17 0502  WBC 7.8 7.4  HGB 10.5* 10.5*  HCT 32.8* 32.8*  PLT 216 229   BMET  Recent Labs  06/09/17 0450 06/10/17 0502  NA 143 142  K 3.8 3.8  CL 113* 111  CO2 24 22  GLUCOSE 110* 107*  BUN <5* 8  CREATININE 0.86 0.78  CALCIUM 8.6* 8.8*   PT/INR No results for input(s): LABPROT, INR in the last 72 hours. ABG No results for input(s): PHART, HCO3 in the last 72 hours.  Invalid input(s): PCO2, PO2  Studies/Results: No results found.  Anti-infectives: Anti-infectives    Start     Dose/Rate Route Frequency Ordered Stop   06/08/17 0200  cefoTEtan in Dextrose 5% (CEFOTAN) IVPB 2 g     2 g Intravenous Every 12 hours 06/07/17 1826 06/08/17 0132   06/07/17 1059  cefoTEtan in Dextrose 5% (CEFOTAN) IVPB 2 g     2 g Intravenous On call to O.R. 06/07/17 1059 06/07/17 1352      Assessment/Plan: s/p Procedure(s): XI ROBOT  SIGMOIDECTOMY (N/A) ROBOTIC INCISIONAL HERNIA REPAIR (N/A) Advance diet  Ambulate If she has a good day today then will consider d/c tomorrow or Monday POD3  LOS: 3 days    TOTH III,[name] S 06/10/2017

## 2017-06-11 MED ORDER — TRAMADOL HCL 50 MG PO TABS: 50.0000 mg | ORAL_TABLET | Freq: Four times a day (QID) | ORAL | 0 refills | Status: DC | PRN

## 2017-06-11 NOTE — Progress Notes (Signed)
Nurse reviewed discharge instructions with pt.  Pt verbalized understanding of discharge instructions, follow up appointments and new medication.  No concerns at time of discharge. Prescription given to pt prior to discharge.

## 2017-06-11 NOTE — Progress Notes (Signed)
4 Days Post-Op   Subjective/Chief Complaint: Feels good. Ready to go home   Objective: Vital signs in last 24 hours: Temp:  [97.8 F (36.6 C)-98 F (36.7 C)] 97.9 F (36.6 C) (07/29 0600) Pulse Rate:  [59-72] 72 (07/29 0600) Resp:  [16] 16 (07/29 0600) BP: (131-152)/(52-77) 136/52 (07/29 0600) SpO2:  [97 %-98 %] 97 % (07/29 0600) Last BM Date: 06/10/17  Intake/Output from previous day: 07/28 0701 - 07/29 0700 In: 600 [P.O.:600] Out: 800 [Urine:800] Intake/Output this shift: No intake/output data recorded.  General appearance: alert and cooperative Resp: clear to auscultation bilaterally Cardio: regular rate and rhythm GI: soft, nontender. incisions look good  Lab Results:   Recent Labs  06/09/17 0450 06/10/17 0502  WBC 7.8 7.4  HGB 10.5* 10.5*  HCT 32.8* 32.8*  PLT 216 229   BMET  Recent Labs  06/09/17 0450 06/10/17 0502  NA 143 142  K 3.8 3.8  CL 113* 111  CO2 24 22  GLUCOSE 110* 107*  BUN <5* 8  CREATININE 0.86 0.78  CALCIUM 8.6* 8.8*   PT/INR No results for input(s): LABPROT, INR in the last 72 hours. ABG No results for input(s): PHART, HCO3 in the last 72 hours.  Invalid input(s): PCO2, PO2  Studies/Results: No results found.  Anti-infectives: Anti-infectives    Start     Dose/Rate Route Frequency Ordered Stop   06/08/17 0200  cefoTEtan in Dextrose 5% (CEFOTAN) IVPB 2 g     2 g Intravenous Every 12 hours 06/07/17 1826 06/08/17 0132   06/07/17 1059  cefoTEtan in Dextrose 5% (CEFOTAN) IVPB 2 g     2 g Intravenous On call to O.R. 06/07/17 1059 06/07/17 1352      Assessment/Plan: s/p Procedure(s): XI ROBOT  SIGMOIDECTOMY (N/A) ROBOTIC INCISIONAL HERNIA REPAIR (N/A) Advance diet Discharge  LOS: 4 days    TOTH III,Tage Feggins S 06/11/2017

## 2017-06-11 NOTE — Progress Notes (Signed)
Occupational Therapy Treatment Patient Details Name: [name] MRN: [id number] DOB: 04/02/1941 Today's Date: 06/11/2017    History of present illness 76 y.o. female admitted with colon cancer. S/P sigmoidectomy, incisional hernia repair 7/25   OT comments  Pt progressing. Practiced bed mobility in session and education provided.  Follow Up Recommendations  Home health OT;Supervision - Intermittent    Equipment Recommendations  None recommended by OT (pt doesn't want 3 in 1 commode)    Recommendations for Other Services      Precautions / Restrictions Precautions Precautions: Fall Precaution Comments: abd surgery, abd binder Restrictions Weight Bearing Restrictions: No       Mobility Bed Mobility Overal bed mobility: Needs Assistance Bed Mobility: Rolling;Sidelying to Sit;Sit to Sidelying Rolling: Supervision Sidelying to sit: Supervision     Sit to sidelying: Supervision General bed mobility comments: cues given   Transfers Overall transfer level: Needs assistance     Sit to Stand: Supervision              Balance       A little unsteady on feet.                                      ADL either performed or assessed with clinical judgement   ADL Overall ADL's : Needs assistance/impaired     Grooming: Wash/dry hands;Supervision/safety;Standing           Upper Body Dressing : Minimal assistance Upper Body Dressing Details (indicate cue type and reason): assisted to tighten binder Lower Body Dressing: Supervision/safety;Sit to/from stand   Toilet Transfer: Supervision/safety;Ambulation;Regular Toilet   Toileting- Water quality scientist and Hygiene: Supervision/safety;Sit to/from stand       Functional mobility during ADLs: Supervision/safety General ADL Comments: Educated on energy conservation. Pt able to don pants without AE but showed pt use of AE to prevent her straining her abdomen. Pt reports that she has a stool to use  in shower and OT recommended using it.      Vision       Perception     Praxis      Cognition Arousal/Alertness: Awake/alert Behavior During Therapy: WFL for tasks assessed/performed Overall Cognitive Status: Within Functional Limits for tasks assessed                                          Exercises     Shoulder Instructions       General Comments      Pertinent Vitals/ Pain       Pain Assessment: 0-10 Pain Score: 4  Pain Location: abdomen Pain Descriptors / Indicators: Sore Pain Intervention(s): Monitored during session;Repositioned  Home Living                                          Prior Functioning/Environment              Frequency  Min 2X/week        Progress Toward Goals  OT Goals(current goals can now be found in the care plan section)  Progress towards OT goals: Progressing toward goals (updated bed mobility goal)  Acute Rehab OT Goals Patient Stated Goal: to go home OT Goal Formulation: With patient  Time For Goal Achievement: 06/15/17 Potential to Achieve Goals: Good ADL Goals Pt Will Perform Lower Body Bathing: with supervision;with adaptive equipment;sit to/from stand Pt Will Perform Lower Body Dressing: with supervision;with adaptive equipment;sit to/from stand Pt Will Transfer to Toilet: with supervision;ambulating;regular height toilet;bedside commode (vs) Additional ADL Goal #1: pt will perfom bed mobility at Mod I level in preparation for ADLs.  Plan Discharge plan remains appropriate    Co-evaluation                 AM-PAC PT "6 Clicks" Daily Activity     Outcome Measure   Help from another person eating meals?: None Help from another person taking care of personal grooming?: A Little Help from another person toileting, which includes using toliet, bedpan, or urinal?: A Little Help from another person bathing (including washing, rinsing, drying)?: A Little Help from another  person to put on and taking off regular upper body clothing?: A Little Help from another person to put on and taking off regular lower body clothing?: A Little 6 Click Score: 19    End of Session Equipment Utilized During Treatment: Other (comment) (abdominal binder)  OT Visit Diagnosis: Unsteadiness on feet (R26.81);Pain   Activity Tolerance Patient tolerated treatment well   Patient Left in bed;with call bell/phone within reach   Nurse Communication          Time: 1610-9604 OT Time Calculation (min): 22 min  Charges: OT General Charges $OT Visit: 1 Procedure OT Treatments $Self Care/Home Management : 8-22 mins    Gethsemane Fischler L Jenisha Faison OTR/L 06/11/2017, 9:12 AM

## 2017-06-13 NOTE — Discharge Summary (Signed)
[facility] Discharge Summary   Patient ID: [name] MRN: [id number] DOB/AGE: 05-04-1941 76 y.o.  Admit date: 06/07/2017 Discharge date: 06/13/2017  Admitting Diagnosis: Colorectal cancer  Discharge Diagnosis Patient Active Problem List   Diagnosis Date Noted  . Colon cancer ([place]) 06/07/2017  . Abnormal stools   . Neoplasm of digestive system   . Bradycardia 02/14/2017  . HLD (hyperlipidemia) 02/13/2017  . Onychomycosis 12/29/2016  . Osteopenia 08/16/2016  . Macular degeneration 08/12/2015  . Routine general medical examination at a health care facility 09/26/2014  . Chronic constipation 05/27/2014  . Bartholin cyst 03/28/2014  . Endometrial cancer ([place]) 12/30/2013  . Essential hypertension, benign 04/17/2013  . Tremor 07/08/2011    Consultants None  Imaging: No results found.  Procedures Dr. Marcello Moores (06/07/17) - XI ROBOT SIGMOIDECTOMY, INCISIONAL HERNIA REPAIR  Hospital Course:  [name] is a 76yo female PMH colorectal cancer who was admitted to [facility] for XI robot sigmoidectomy. She had a nonobstructing mass approximately 20 cm from the anal verge which biopsy proved to be adenocarcinoma. CT scan of the abdomen showed no sign of metastatic disease andCEA was normal. Patient underwent procedure listed above.  Tolerated procedure well and was transferred to the floor. Diet was advanced as tolerated.  On POD4 the patient was voiding well, tolerating diet, ambulating well, pain well controlled, vital signs stable, incisions c/d/i and felt stable for discharge home.  Patient will follow up in our office in 2 weeks and knows to call with questions or concerns.  She will call to confirm appointment date/time.    I was not directly involved in this patient's care therefore the information in this discharge summary was taken from the chart.   Allergies as of 06/11/2017      Reactions   Codeine Nausea And Vomiting      Medication List    TAKE these  medications   amoxicillin 500 MG capsule Commonly known as:  AMOXIL Take 1 capsule (500 mg total) by mouth 2 (two) times daily.   docusate sodium 100 MG capsule Commonly known as:  COLACE Take 300 mg by mouth daily.   fluticasone 50 MCG/ACT nasal spray Commonly known as:  FLONASE Place 2 sprays into both nostrils daily. What changed:  when to take this  reasons to take this   losartan 50 MG tablet Commonly known as:  COZAAR Take 1 tablet (50 mg total) by mouth daily.   Magnesium 400 MG Tabs Take 400 mg by mouth daily as needed (irritable bowel syndrome).   metoprolol succinate 100 MG 24 hr tablet Commonly known as:  TOPROL-XL Take 1 tablet (100 mg total) by mouth daily.   MIRALAX PO Take 17 g by mouth daily as needed (constipation).   mometasone 0.1 % lotion Commonly known as:  ELOCON Apply 4 drops topically at bedtime. 4 drops in each ear qhs prn itching in the ear   multivitamin with minerals Tabs tablet Take 1 tablet by mouth daily.   OVER THE COUNTER MEDICATION Take 1 capsule by mouth daily after breakfast. IBgard   PRESERVISION AREDS 2 Caps Take 1 capsule by mouth 2 (two) times daily.   simvastatin 20 MG tablet Commonly known as:  ZOCOR Take 1 tablet (20 mg total) by mouth every evening.   topiramate 50 MG tablet Commonly known as:  TOPAMAX Take 1 tablet (50 mg total) by mouth 2 (two) times daily.   traMADol 50 MG tablet Commonly known as:  ULTRAM Take 1-2 tablets (  50-100 mg total) by mouth every 6 (six) hours as needed.        Follow-up Information    Leighton Ruff, MD. Schedule an appointment as soon as possible for a visit in 2 week(s).   Specialty:  General Surgery Contact information: [address] [place] [address] [phone]           Signed: Wellington [place], [facility] 06/13/2017, 2:58 PM Pager: [phone] Consults: [phone] Mon-Fri 7:00 am-4:30 pm Sat-Sun 7:00 am-11:30 am

## 2017-06-21 ENCOUNTER — Encounter: Payer: Self-pay | Admitting: Family

## 2017-07-07 ENCOUNTER — Encounter: Payer: Self-pay | Admitting: *Deleted

## 2017-07-07 ENCOUNTER — Inpatient Hospital Stay: Payer: Medicare HMO | Attending: Oncology | Admitting: Oncology

## 2017-07-07 ENCOUNTER — Encounter: Payer: Self-pay | Admitting: Oncology

## 2017-07-07 ENCOUNTER — Inpatient Hospital Stay: Payer: Medicare HMO

## 2017-07-07 VITALS — BP 163/74 | HR 70 | Temp 97.9°F | Resp 20 | Ht 65.0 in | Wt 224.4 lb

## 2017-07-07 DIAGNOSIS — Z8542 Personal history of malignant neoplasm of other parts of uterus: Secondary | ICD-10-CM

## 2017-07-07 DIAGNOSIS — K589 Irritable bowel syndrome without diarrhea: Secondary | ICD-10-CM | POA: Insufficient documentation

## 2017-07-07 DIAGNOSIS — K5909 Other constipation: Secondary | ICD-10-CM | POA: Diagnosis not present

## 2017-07-07 DIAGNOSIS — Z800 Family history of malignant neoplasm of digestive organs: Secondary | ICD-10-CM | POA: Insufficient documentation

## 2017-07-07 DIAGNOSIS — K219 Gastro-esophageal reflux disease without esophagitis: Secondary | ICD-10-CM | POA: Insufficient documentation

## 2017-07-07 DIAGNOSIS — I10 Essential (primary) hypertension: Secondary | ICD-10-CM | POA: Insufficient documentation

## 2017-07-07 DIAGNOSIS — E559 Vitamin D deficiency, unspecified: Secondary | ICD-10-CM | POA: Insufficient documentation

## 2017-07-07 DIAGNOSIS — Z79899 Other long term (current) drug therapy: Secondary | ICD-10-CM | POA: Diagnosis not present

## 2017-07-07 DIAGNOSIS — C187 Malignant neoplasm of sigmoid colon: Secondary | ICD-10-CM | POA: Diagnosis not present

## 2017-07-07 DIAGNOSIS — Z803 Family history of malignant neoplasm of breast: Secondary | ICD-10-CM

## 2017-07-07 MED ORDER — LIDOCAINE-PRILOCAINE 2.5-2.5 % EX CREA: 1.0000 "application " | TOPICAL_CREAM | CUTANEOUS | 0 refills | Status: DC | PRN

## 2017-07-07 NOTE — Progress Notes (Signed)
Met with Ms. Bellantoni after her initial appointment with Dr. Tasia Catchings to discuss further treatment for her colon cancer s/p surgery. Patient was a candidate for Alliance [phone].  The study was explained to the patient and she refused the study once she learned that she would need treatment with atezolizumab for 6 months after her initial FOLFOX therapy was completed.  Patient was informed that she could be randomized to the arm without the IP but she was still not interested and did not want to take the information home to review. Thanked the patient for her time.  Patient was not accompanied by family.  Mariea Clonts RN was present at the time I was speaking with the patient.  Raynelle Dick, RN BSN "07/07/2017 4:10 PM"

## 2017-07-07 NOTE — Patient Instructions (Signed)
NEXT STEPS:  Genetic testing Port a catheter placement Chemotherapy class with Magdalene Patricia FOLFOX every 2 weeks for 6 months (12 cycles) Clinical trial

## 2017-07-07 NOTE — Progress Notes (Signed)
  Oncology Nurse Navigator Documentation  Navigator Location: [facility] (07/07/17 1500)   )Navigator Encounter Type: Initial MedOnc (07/07/17 1500)                  Met with [name] during and after consult with Dr. Tasia Catchings. Introduced nurse navigator services and provided contact number for any future questions or needs. Educated further on next steps.       Barriers/Navigation Needs: Education;Coordination of Care (07/07/17 1500)   Interventions: Education (07/07/17 1500)     Education Method: Verbal (07/07/17 1500)      Acuity: Level 2 (07/07/17 1500)   Acuity Level 2: Initial guidance, education and coordination as needed;Educational needs;Ongoing guidance and education throughout treatment as needed (07/07/17 1500)     Time Spent with Patient: 60 (07/07/17 1500)

## 2017-07-07 NOTE — Progress Notes (Signed)
Patient here for initial visit. She has a history of endometrial cancer.

## 2017-07-07 NOTE — Progress Notes (Addendum)
Hematology/Oncology Consult note [facility] Telephone:([phone] [phone]  CONSULT NOTE Patient Care Team: Burnard Hawthorne, FNP as PCP - General (Family Medicine) Vladimir Crofts, MD (Neurology) Noreene Filbert, MD as Referring Physician (Radiation Oncology)  CHIEF COMPLAINTS/PURPOSE OF CONSULTATION:  Colon cancer   HISTORY OF PRESENTING ILLNESS:  [name] 76 y.o.  female with PMH listed as below was referred to me for evaluation and management of colon cancer.  She had colonoscopy evaluation due to positive cologuard.  She was found to have a nonobstructing mass approximately 20 cm from the anal verge which biopsy proved to be adenocarcinoma. CT scan showed a large mass from the sigmoid and extends beyond the wall of the colon toward the left into the surrounding fat. Preoperational CEA was normal. Patient underwent robotic sigmoidectomy on 06/07/2017.   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 MLH1  loss of function, PMS2 loss of function.   Today she is here for evaluation for adjuvant chemotherapy. Patient denies SOB, chest pain, abdominal pain, fever or chills. She has a history of endometrial cancer s/p hysterotomy and radiation. She has a family history of colon cancer and breast cancer.    ROS:  Review of Systems  Constitutional: Negative.   HENT:  Negative.   Eyes: Negative.   Respiratory: Negative.   Cardiovascular: Negative.   Gastrointestinal: Negative.   Endocrine: Negative.   Genitourinary: Negative.    Musculoskeletal: Negative.   Skin: Negative.     MEDICAL HISTORY:  Past Medical History:  Diagnosis Date  . Arthritis    bitateral knees and left foot,s/p cortisone injection in past  . Chronic constipation   . Complication of anesthesia     slow to wake up   . Endometrial cancer ([place]) 2014   Total Hysterectomy  and Rad tx's.   Marland Kitchen GERD (gastroesophageal reflux disease)   . Hypertension   . IBS (irritable bowel syndrome)   . Menopause    age 79  . PONV (postoperative nausea and vomiting)   . Vitamin D deficiency 06/07/10    SURGICAL HISTORY: Past Surgical History:  Procedure Laterality Date  . BUNIONECTOMY    . COLONOSCOPY WITH PROPOFOL N/A 04/04/2017   Procedure: COLONOSCOPY WITH PROPOFOL;  Surgeon: [name], MD;  Location: [facility] ENDOSCOPY;  Service: Endoscopy;  Laterality: N/A;  . DILATION AND CURETTAGE OF UTERUS  1971  . FOOT SURGERY    . NASAL SINUS SURGERY    . ROBOTIC ASSISTED LAPAROSCOPIC VENTRAL/INCISIONAL HERNIA REPAIR N/A 06/07/2017   Procedure: ROBOTIC INCISIONAL HERNIA REPAIR;  Surgeon: Leighton Ruff, MD;  Location: [facility] ORS;  Service: General;  Laterality: N/A;  . VAGINAL DELIVERY    . VAGINAL HYSTERECTOMY      SOCIAL HISTORY: Social History   Social History  . Marital status: Divorced    Spouse name: N/A  . Number of children: N/A  . Years of education: N/A   Occupational History  . Not on file.   Social History Main Topics  . Smoking status: Never Smoker  . Smokeless tobacco: Never Used  . Alcohol use Yes     Comment: socially/rarely  . Drug use: No  . Sexual activity: No   Other Topics Concern  . Not on file   Social History Narrative   Exercise- aerobic exercise at home 3-4x week.       [place] No pets.  FAMILY HISTORY: Family History  Problem Relation Age of Onset  . Heart disease Father 49       massive MI  . Diabetes Father   . Cancer Sister        breast  . Bipolar disorder Sister   . Osteoporosis Sister   . Breast cancer Sister 74  . Bipolar disorder Maternal Aunt   . Breast cancer Maternal Aunt   . Cancer Maternal Grandfather        colon cancer  . Colon cancer Maternal Grandfather 73  . Osteoporosis Mother   . Colon cancer Mother     ALLERGIES:  is allergic to codeine.  MEDICATIONS:  Current Outpatient  Prescriptions  Medication Sig Dispense Refill  . docusate sodium (COLACE) 100 MG capsule Take 300 mg by mouth daily.     . fluticasone (FLONASE) 50 MCG/ACT nasal spray Place 2 sprays into both nostrils daily. (Patient taking differently: Place 2 sprays into both nostrils daily as needed for allergies. ) 16 g 2  . losartan (COZAAR) 50 MG tablet Take 1 tablet (50 mg total) by mouth daily. 90 tablet 3  . Magnesium 400 MG TABS Take 400 mg by mouth daily as needed (irritable bowel syndrome).    . metoprolol succinate (TOPROL-XL) 100 MG 24 hr tablet Take 1 tablet (100 mg total) by mouth daily. 90 tablet 3  . Multiple Vitamin (MULTIVITAMIN WITH MINERALS) TABS tablet Take 1 tablet by mouth daily.    . Multiple Vitamins-Minerals (PRESERVISION AREDS 2) CAPS Take 1 capsule by mouth 2 (two) times daily.    Marland Kitchen OVER THE COUNTER MEDICATION Take 1 capsule by mouth daily after breakfast. IBgard    . Polyethylene Glycol 3350 (MIRALAX PO) Take 17 g by mouth daily as needed (constipation).     . simvastatin (ZOCOR) 20 MG tablet Take 1 tablet (20 mg total) by mouth every evening. 90 tablet 4  . topiramate (TOPAMAX) 50 MG tablet Take 1 tablet (50 mg total) by mouth 2 (two) times daily. 180 tablet 2  . lidocaine-prilocaine (EMLA) cream Apply 1 application topically as needed. 30 g 0   No current facility-administered medications for this visit.       Marland Kitchen  PHYSICAL EXAMINATION: ECOG PERFORMANCE STATUS: 0 - Asymptomatic Vitals:   07/07/17 1428  BP: (!) 163/74  Pulse: 70  Resp: 20  Temp: 97.9 F (36.6 C)   Filed Weights   07/07/17 1428  Weight: 224 lb 6.4 oz (101.8 kg)    GENERAL: Well-nourished well-developed; Alert, no distress and comfortable.  EYES: no pallor or icterus OROPHARYNX: no thrush or ulceration; good dentition  NECK: supple, no masses felt LYMPH:  no palpable lymphadenopathy in the cervical, axillary or inguinal regions LUNGS: clear to auscultation and  No wheeze or crackles HEART/CVS:  regular rate & rhythm and no murmurs; No lower extremity edema ABDOMEN: abdomen soft, non-tender and normal bowel sounds Musculoskeletal:no cyanosis of digits and no clubbing  PSYCH: alert & oriented x 3  NEURO: no focal motor/sensory deficits SKIN:  no rashes or significant lesions  LABORATORY DATA:  I have reviewed the data as listed Lab Results  Component Value Date   WBC 7.4 06/10/2017   HGB 10.5 (L) 06/10/2017   HCT 32.8 (L) 06/10/2017   MCV 87.2 06/10/2017   PLT 229 06/10/2017    Recent Labs  08/17/16 0803 04/05/17 1612  06/08/17 0525 06/09/17 0450 06/10/17 0502  NA 143 140  < > 141 143 142  K 4.1 3.6  < >  4.3 3.8 3.8  CL 107 113*  < > 113* 113* 111  CO2 27 20*  < > 19* 24 22  GLUCOSE 111* 91  < > 175* 110* 107*  BUN 12 11  < > 9 <5* 8  CREATININE 0.88 0.88  < > 0.85 0.86 0.78  CALCIUM 9.2 9.2  < > 8.7* 8.6* 8.8*  GFRNONAA  --  >60  < > >60 >60 >60  GFRAA  --  >60  < > >60 >60 >60  PROT 7.4 7.1  --   --   --   --   ALBUMIN 3.8 3.8  --   --   --   --   AST 25 30  --   --   --   --   ALT 22 16  --   --   --   --   ALKPHOS 106 100  --   --   --   --   BILITOT 0.5 0.7  --   --   --   --   < > = values in this interval not displayed.  RADIOGRAPHIC STUDIES: I have personally reviewed the radiological images as listed and agreed with the findings in the report. 04/12/2017 CT abdomen pelvis w contrast Large mass arising from the sigmoid colon. This mass extends beyond the wall of the colon toward the left into the surrounding fat. This lesion does not reach the left pelvic sidewall. No adenopathy or liver lesions evident. No omental lesions appreciable. Sizable midline ventral hernia slightly superior to the umbilicus containing fat and a loop of colon without bowel compromise. Much smaller midline umbilical ventral hernia containing only fat. There is a small hiatal hernia. No bowel obstruction.  No abscess.  Appendix region appears normal. No renal or ureteral  calculus.  No hydronephrosis. There is aortoiliac atherosclerosis. There are foci of coronary artery calcification.   ASSESSMENT & PLAN:  76 y.o.female who has Stage IIIB colon cancer.  Cancer Staging Colon cancer [facility]) Staging form: Colon and Rectum, AJCC 8th Edition - Clinical stage from 07/06/2017: Stage IIIB (cT4a, cN1c, cM0) - Unsigned  1. Malignant neoplasm of sigmoid colon ([place])   2. History of endometrial cancer   3. Family history of colon cancer   4. Family history of breast cancer    # Pathology result was discussed with patient. Adjuvant chemotherapy is recommended.  [facility]iance trial with FOLFOX +/- Huey Bienenstock was discussed with patient. Patient declined.   #Standard adjuvant chemotherapy is recommended. I would recommend ajuvant chemotherapy with FOLFOX which would be 5FU with Oxalipaltin and leucovorin IV Q2 weeks for 12 treatments. I explained to the patient the risks and benefits of adjuvant chemotherapy including all but not limited to nausea, vomiting, hair loss, hand and foot syndrome, neuropathy, diarrhea, low blood counts and risk of infection and hospitalization, even death. Risk of neuropathy is associated with Oxaliplatin. Patient understands and agrees to proceed as planned. Given her age, will start with FOLFOX, however if she develops toxicity, will omit Oxaliplatin.  # Intent of treatment: curative  # Obtain CT chest to complete staging.  #  Chemotherapy education; port placement. Hopefully the planned start chemotherapy in 2-3 weeks.  Antiemetics-Zofran and Compazine; EMLA cream sent to pharmacy # refer to surgeon for medi port placement.  #  T4 tumor, will refer to RadOnc to discuss RT after chemotherapy. Patient appears to be overwhelmed today with chemotherapy discussion. Will revisit this next visit.  #  Significant family  history suspecting germline mutation, ?lynch syndrome. Genetic test panel will be obtained.  All questions were answered. The patient  knows to call the clinic with any problems questions or concerns.  Return of visit: 2 weeks Thank you for this kind referral and the opportunity to participate in the care of this patient. A copy of today's note is routed to referring provider    Earlie Server, MD, PhD Hematology Oncology [facility] Pager- [phone] 07/07/2017

## 2017-07-09 ENCOUNTER — Encounter: Payer: Self-pay | Admitting: Family

## 2017-07-11 ENCOUNTER — Encounter: Payer: Self-pay | Admitting: Surgery

## 2017-07-11 ENCOUNTER — Ambulatory Visit (INDEPENDENT_AMBULATORY_CARE_PROVIDER_SITE_OTHER): Payer: Medicare HMO | Admitting: Surgery

## 2017-07-11 VITALS — BP 169/81 | HR 62 | Temp 98.0°F | Ht 65.0 in | Wt 227.2 lb

## 2017-07-11 DIAGNOSIS — C187 Malignant neoplasm of sigmoid colon: Secondary | ICD-10-CM

## 2017-07-11 NOTE — Progress Notes (Signed)
Surgical Consultation  07/11/2017  [name] is an 76 y.o. female.   Chief Complaint  Patient presents with  . Established Patient    Discuss Port Placement     HPI: 76 year old female with recent sigmoid colectomy for cancer and positive nodes in need for chemotherapy and port placement. She has done and recovered very well after surgery and has no abdominal complaints. She does not take any anticoagulants and denies any easy bruising or any manipulation of her central veins. She was me go ahead and do the port placement as soon as possible. Path reviewed as well as previous op reports. nml Pl  Past Medical History:  Diagnosis Date  . Arthritis    bitateral knees and left foot,s/p cortisone injection in past  . Chronic constipation   . Complication of anesthesia     slow to wake up   . Endometrial cancer ([place]) 2014   Total Hysterectomy and Rad tx's.   Marland Kitchen GERD (gastroesophageal reflux disease)   . Hypertension   . IBS (irritable bowel syndrome)   . Menopause    age 74  . PONV (postoperative nausea and vomiting)   . Vitamin D deficiency 06/07/10    Past Surgical History:  Procedure Laterality Date  . BUNIONECTOMY    . COLONOSCOPY WITH PROPOFOL N/A 04/04/2017   Procedure: COLONOSCOPY WITH PROPOFOL;  Surgeon: [name], MD;  Location: [facility] ENDOSCOPY;  Service: Endoscopy;  Laterality: N/A;  . DILATION AND CURETTAGE OF UTERUS  1971  . FOOT SURGERY    . NASAL SINUS SURGERY    . ROBOTIC ASSISTED LAPAROSCOPIC VENTRAL/INCISIONAL HERNIA REPAIR N/A 06/07/2017   Procedure: ROBOTIC INCISIONAL HERNIA REPAIR;  Surgeon: Leighton Ruff, MD;  Location: [facility] ORS;  Service: General;  Laterality: N/A;  . VAGINAL DELIVERY    . VAGINAL HYSTERECTOMY      Family History  Problem Relation Age of Onset  . Heart disease Father 20       massive MI  . Diabetes Father   . Cancer Sister        breast  . Bipolar disorder Sister   . Osteoporosis Sister   . Breast cancer Sister 8  .  Bipolar disorder Maternal Aunt   . Breast cancer Maternal Aunt   . Cancer Maternal Grandfather        colon cancer  . Colon cancer Maternal Grandfather 5  . Osteoporosis Mother   . Colon cancer Mother     Social History:  reports that she has never smoked. She has never used smokeless tobacco. She reports that she drinks alcohol. She reports that she does not use drugs.  Allergies:  Allergies  Allergen Reactions  . Codeine Nausea And Vomiting    Medications reviewed.     ROS Full ROS performed and is otherwise negative other than what is stated in the HPI    BP (!) 169/81   Pulse 62   Temp 98 F (36.7 C) (Oral)   Ht 5\' 5"  (1.651 m)   Wt 103.1 kg (227 lb 3.2 oz)   BMI 37.81 kg/m    Physical Exam  Constitutional: She is oriented to person, place, and time and well-developed, well-nourished, and in no distress. No distress.  Neck: Normal range of motion. No JVD present. No tracheal deviation present.  Cardiovascular: Normal rate.   Pulmonary/Chest: Effort normal. No stridor. No respiratory distress. She has no wheezes. She has no rales. She exhibits no tenderness.  Abdominal: Soft. She exhibits no mass. There  is no tenderness. There is no rebound and no guarding.  Incisions healing well , no infection  Musculoskeletal: Normal range of motion. She exhibits no edema.  Neurological: She is alert and oriented to person, place, and time. Gait normal. GCS score is 15.  Psychiatric: Mood, memory, affect and judgment normal.  Nursing note and vitals reviewed.  Assessment/Plan: Colon cancer need for adjuvant chemotherapy and port placement. Discussed with the patient in detail about the procedure. Risk, benefits and possible complications including but not limited to: Bleeding, pneumothorax, vascular injury and infection. She understands and wishes to proceed. All her questions were answered   Caroleen Hamman, MD [place] Surgeon

## 2017-07-11 NOTE — Patient Instructions (Signed)
We have seen you today and have spoken about your port placement. This has been scheduled for 07/12/17 at [facility] by Dr. Dahlia Byes.  Please see the [facility]) Sheet provided for further details.  Please call our office with any questions or concerns that you have.   Port[facility]) A central line is a soft, flexible tube (catheter) that can be used to collect blood for testing or to give medicine or nutrition through a vein. The tip of the central line ends in a large vein just above the heart called the vena cava. A central line may be placed because:  You need to get medicines or fluids through an IV tube for a long period of time.  You need nutrition but cannot eat or absorb nutrients.  The veins in your hands or arms are hard to access.  You need to have blood taken often for blood tests.  You need a blood transfusion  You need chemotherapy or dialysis.  There are many types of central lines:  Peripherally inserted central catheter (PICC) line. This type is used for intermediate access to long-term access of one week or more. It can be used to draw blood and give fluids or medicines. A PICC looks like an IV tube, but it goes up the arm to the heart. It is usually inserted in the upper arm and taped in place on the arm.  Tunneled central line. This type is used for long-term therapy and dialysis. It is placed in a large vein in the neck, chest, or groin. A tunneled central line is inserted through a small incision made over the vein and is advanced into the heart. It is tunneled beneath the skin and brought out through a second incision.  Non-tunneled central line. This type is used for short-term access, usually of a maximum of 7 days. It is often used in the emergency department. A non-tunneled central line is inserted in the neck, chest, or groin.  Implanted port. This type is used for long-term therapy. It can stay in place longer than other types of central lines. An  implanted port is normally inserted in the upper chest but can also be placed in the upper arm or in the abdomen. It is inserted and removed with surgery, and it is accessed using a special needle.  The type of central line that you receive depends on how long you will need it, your medical condition, and the condition of your veins. What are the risks? Using any type of central line has risks that you should be aware of, including:  Infection.  A blood clot that blocks the central line or forms in the vein and travels to the heart.  Bleeding from the place where the central line was put in.  Developing a hole or crack within the central line. If this happens, the central line will need to be replaced.  Developing an abnormal heart rhythm (arrhythmia). This is rare.  Central line failure.  Follow these instructions at home: Flushing and cleaning the central line  Follow instructions from the health care provider about flushing and cleaning the central line.  Wear a mask when flushing or cleaning the central line.  Before you flush or clean the central line: ? Wash your hands with soap and water. ? Clean the central line hub with rubbing alcohol. Insertion site care  Keep the insertion site of your central line clean and dry at all times.  Check your incision or central  line site every day for signs of infection. Check for: ? More redness, swelling, or pain. ? More fluid or blood. ? Warmth. ? Pus or a bad smell. General instructions  Follow instructions from your health care provider for the type of device that you have.  If the central line accidentally gets pulled on, make sure: ? The bandage (dressing) is okay. ? There is no bleeding. ? The line has not been pulled out.  Return to your normal activities as told by your health care provider. Ask your health care provider what activities are safe for you. You may be restricted from lifting or making repetitive arm  movements on the side with the catheter.  Do not swim or bathe unless your health care provider approves.  Keep your dressing dry. Your health care provider can instruct you about how to keep your specific type of dressing from getting wet.  Keep all follow-up visits as told by your health care provider. This is important. Contact a health care provider if:  You have more redness, swelling, or pain around your incision.  You have more fluid or blood coming from your incision.  Your incision feels warm to the touch.  You have pus or a bad smell coming from your incision. Get help right away if:  You have: ? Chills. ? A fever. ? Shortness of breath. ? Trouble breathing. ? Chest pain. ? Swelling in your neck, face, chest, or arm on the side of your central line.  You are coughing.  You feel your heart beating rapidly or skipping beats.  You feel dizzy or you faint.  Your incision or central line site has red streaks spreading away from the area.  Your incision or central line site is bleeding and does not stop.  Your central line is difficult to flush or will not flush.  You do not get a blood return from the central line.  Your central line gets loose or comes out.  Your central line gets damaged.  Your catheter leaks when flushed or when fluids are infused into it. This information is not intended to replace advice given to you by your health care provider. Make sure you discuss any questions you have with your health care provider. Document Released: 12/22/2005 Document Revised: 06/29/2016 Document Reviewed: 06/08/2016 Elsevier Interactive Patient Education  2017 [employer].

## 2017-07-11 NOTE — Patient Instructions (Signed)

## 2017-07-12 ENCOUNTER — Encounter
Admission: RE | Disposition: A | Discharge status: Home / Self Care | Payer: Self-pay | Source: Ambulatory Visit | Attending: Surgery

## 2017-07-12 ENCOUNTER — Ambulatory Visit: Payer: Medicare HMO

## 2017-07-12 ENCOUNTER — Ambulatory Visit: Payer: Medicare HMO | Admitting: Anesthesiology

## 2017-07-12 ENCOUNTER — Telehealth: Payer: Self-pay | Admitting: Surgery

## 2017-07-12 ENCOUNTER — Ambulatory Visit
Admission: RE | Admit: 2017-07-12 | Discharge: 2017-07-12 | Disposition: A | Discharge status: Home / Self Care | Payer: Medicare HMO | Source: Ambulatory Visit | Attending: Surgery | Admitting: Surgery

## 2017-07-12 ENCOUNTER — Encounter: Payer: Self-pay | Admitting: *Deleted

## 2017-07-12 DIAGNOSIS — Z833 Family history of diabetes mellitus: Secondary | ICD-10-CM | POA: Insufficient documentation

## 2017-07-12 DIAGNOSIS — E559 Vitamin D deficiency, unspecified: Secondary | ICD-10-CM | POA: Diagnosis not present

## 2017-07-12 DIAGNOSIS — Z885 Allergy status to narcotic agent status: Secondary | ICD-10-CM | POA: Insufficient documentation

## 2017-07-12 DIAGNOSIS — C189 Malignant neoplasm of colon, unspecified: Secondary | ICD-10-CM | POA: Diagnosis not present

## 2017-07-12 DIAGNOSIS — K5909 Other constipation: Secondary | ICD-10-CM | POA: Insufficient documentation

## 2017-07-12 DIAGNOSIS — K589 Irritable bowel syndrome without diarrhea: Secondary | ICD-10-CM | POA: Diagnosis not present

## 2017-07-12 DIAGNOSIS — Z9049 Acquired absence of other specified parts of digestive tract: Secondary | ICD-10-CM | POA: Insufficient documentation

## 2017-07-12 DIAGNOSIS — Z8262 Family history of osteoporosis: Secondary | ICD-10-CM | POA: Diagnosis not present

## 2017-07-12 DIAGNOSIS — Z803 Family history of malignant neoplasm of breast: Secondary | ICD-10-CM | POA: Diagnosis not present

## 2017-07-12 DIAGNOSIS — Z8542 Personal history of malignant neoplasm of other parts of uterus: Secondary | ICD-10-CM | POA: Insufficient documentation

## 2017-07-12 DIAGNOSIS — K219 Gastro-esophageal reflux disease without esophagitis: Secondary | ICD-10-CM | POA: Diagnosis not present

## 2017-07-12 DIAGNOSIS — Z95828 Presence of other vascular implants and grafts: Secondary | ICD-10-CM

## 2017-07-12 DIAGNOSIS — Z800 Family history of malignant neoplasm of digestive organs: Secondary | ICD-10-CM | POA: Insufficient documentation

## 2017-07-12 DIAGNOSIS — I517 Cardiomegaly: Secondary | ICD-10-CM | POA: Diagnosis not present

## 2017-07-12 DIAGNOSIS — Z8249 Family history of ischemic heart disease and other diseases of the circulatory system: Secondary | ICD-10-CM | POA: Diagnosis not present

## 2017-07-12 DIAGNOSIS — Z818 Family history of other mental and behavioral disorders: Secondary | ICD-10-CM | POA: Diagnosis not present

## 2017-07-12 DIAGNOSIS — C187 Malignant neoplasm of sigmoid colon: Secondary | ICD-10-CM | POA: Insufficient documentation

## 2017-07-12 DIAGNOSIS — M1990 Unspecified osteoarthritis, unspecified site: Secondary | ICD-10-CM | POA: Diagnosis not present

## 2017-07-12 DIAGNOSIS — Z90710 Acquired absence of both cervix and uterus: Secondary | ICD-10-CM | POA: Diagnosis not present

## 2017-07-12 DIAGNOSIS — Z452 Encounter for adjustment and management of vascular access device: Secondary | ICD-10-CM | POA: Diagnosis not present

## 2017-07-12 DIAGNOSIS — I10 Essential (primary) hypertension: Secondary | ICD-10-CM | POA: Diagnosis not present

## 2017-07-12 HISTORY — PX: PORTACATH PLACEMENT: SHX2246

## 2017-07-12 SURGERY — INSERTION, TUNNELED CENTRAL VENOUS DEVICE, WITH PORT
Anesthesia: General | Laterality: Right | Wound class: Clean

## 2017-07-12 MED ORDER — BUPIVACAINE-EPINEPHRINE (PF) 0.25% -1:200000 IJ SOLN
INTRAMUSCULAR | Status: DC | PRN
  Administered 2017-07-12: 20 mL via PERINEURAL

## 2017-07-12 MED ORDER — PROPOFOL 500 MG/50ML IV EMUL
INTRAVENOUS | Status: DC | PRN
  Administered 2017-07-12: 75 ug/kg/min via INTRAVENOUS

## 2017-07-12 MED ORDER — ONDANSETRON HCL 4 MG/2ML IJ SOLN
INTRAMUSCULAR | Status: DC | PRN
  Administered 2017-07-12: 4 mg via INTRAVENOUS

## 2017-07-12 MED ORDER — HEPARIN SODIUM (PORCINE) 5000 UNIT/ML IJ SOLN
INTRAMUSCULAR | Status: AC
  Filled 2017-07-12: qty 1

## 2017-07-12 MED ORDER — SODIUM CHLORIDE 0.9 % IJ SOLN
INTRAMUSCULAR | Status: AC
  Filled 2017-07-12: qty 10

## 2017-07-12 MED ORDER — CEFAZOLIN SODIUM-DEXTROSE 2-4 GM/100ML-% IV SOLN
2.0000 g | INTRAVENOUS | Status: AC
  Administered 2017-07-12: 2 g via INTRAVENOUS

## 2017-07-12 MED ORDER — HYDROCODONE-ACETAMINOPHEN 5-325 MG PO TABS: 1.0000 | ORAL_TABLET | Freq: Four times a day (QID) | ORAL | 0 refills | Status: DC | PRN

## 2017-07-12 MED ORDER — LACTATED RINGERS IV SOLN
INTRAVENOUS | Status: DC
  Administered 2017-07-12: 11:00:00 via INTRAVENOUS

## 2017-07-12 MED ORDER — FENTANYL CITRATE (PF) 100 MCG/2ML IJ SOLN
INTRAMUSCULAR | Status: DC | PRN
  Administered 2017-07-12: 50 ug via INTRAVENOUS
  Administered 2017-07-12 (×2): 25 ug via INTRAVENOUS

## 2017-07-12 MED ORDER — FAMOTIDINE 20 MG PO TABS
20.0000 mg | ORAL_TABLET | Freq: Once | ORAL | Status: AC
  Administered 2017-07-12: 20 mg via ORAL

## 2017-07-12 MED ORDER — BUPIVACAINE-EPINEPHRINE (PF) 0.25% -1:200000 IJ SOLN
INTRAMUSCULAR | Status: AC
  Filled 2017-07-12: qty 30

## 2017-07-12 MED ORDER — FENTANYL CITRATE (PF) 100 MCG/2ML IJ SOLN
INTRAMUSCULAR | Status: AC
  Filled 2017-07-12: qty 2

## 2017-07-12 MED ORDER — FAMOTIDINE 20 MG PO TABS
ORAL_TABLET | ORAL | Status: AC
  Filled 2017-07-12: qty 1

## 2017-07-12 MED ORDER — FENTANYL CITRATE (PF) 100 MCG/2ML IJ SOLN: 25.0000 ug | INTRAMUSCULAR | Status: DC | PRN

## 2017-07-12 MED ORDER — LIDOCAINE HCL (PF) 1 % IJ SOLN
INTRAMUSCULAR | Status: DC | PRN
  Administered 2017-07-12: 20 mL

## 2017-07-12 MED ORDER — LIDOCAINE HCL (PF) 1 % IJ SOLN
INTRAMUSCULAR | Status: AC
  Filled 2017-07-12: qty 30

## 2017-07-12 MED ORDER — SCOPOLAMINE 1 MG/3DAYS TD PT72: 1.0000 | MEDICATED_PATCH | Freq: Once | TRANSDERMAL | Status: DC

## 2017-07-12 MED ORDER — SODIUM CHLORIDE 0.9 % IV SOLN
INTRAVENOUS | Status: DC | PRN
  Administered 2017-07-12: 2 mL via INTRAMUSCULAR

## 2017-07-12 MED ORDER — LIDOCAINE HCL (CARDIAC) 20 MG/ML IV SOLN
INTRAVENOUS | Status: DC | PRN
  Administered 2017-07-12: 50 mg via INTRAVENOUS

## 2017-07-12 MED ORDER — CHLORHEXIDINE GLUCONATE CLOTH 2 % EX PADS: 6.0000 | MEDICATED_PAD | Freq: Once | CUTANEOUS | Status: DC

## 2017-07-12 MED ORDER — CEFAZOLIN SODIUM-DEXTROSE 2-4 GM/100ML-% IV SOLN
INTRAVENOUS | Status: AC
  Filled 2017-07-12: qty 100

## 2017-07-12 MED ORDER — PROPOFOL 10 MG/ML IV BOLUS
INTRAVENOUS | Status: AC
  Filled 2017-07-12: qty 40

## 2017-07-12 SURGICAL SUPPLY — 32 items
BAG DECANTER FOR FLEXI CONT (MISCELLANEOUS) IMPLANT
BLADE SURG SZ11 CARB STEEL (BLADE) ×3 IMPLANT
BOOT SUTURE AID YELLOW STND (SUTURE) ×3 IMPLANT
CANISTER SUCT 1200ML W/VALVE (MISCELLANEOUS) ×3 IMPLANT
CHLORAPREP W/TINT 26ML (MISCELLANEOUS) ×3 IMPLANT
COVER LIGHT HANDLE STERIS (MISCELLANEOUS) ×6 IMPLANT
DERMABOND ADVANCED (GAUZE/BANDAGES/DRESSINGS) ×2
DERMABOND ADVANCED .7 DNX12 (GAUZE/BANDAGES/DRESSINGS) ×1 IMPLANT
DRAPE C-ARM XRAY 36X54 (DRAPES) ×3 IMPLANT
DRAPE INCISE IOBAN 66X45 STRL (DRAPES) ×3 IMPLANT
ELECT REM PT RETURN 9FT ADLT (ELECTROSURGICAL) ×3
ELECTRODE REM PT RTRN 9FT ADLT (ELECTROSURGICAL) ×1 IMPLANT
GEL ULTRASOUND 20GR AQUASONIC (MISCELLANEOUS) ×3 IMPLANT
GLOVE BIO SURGEON STRL SZ7 (GLOVE) ×9 IMPLANT
GOWN STRL REUS W/ TWL LRG LVL3 (GOWN DISPOSABLE) ×2 IMPLANT
GOWN STRL REUS W/TWL LRG LVL3 (GOWN DISPOSABLE) ×4
IV NS 500ML (IV SOLUTION)
IV NS 500ML BAXH (IV SOLUTION) IMPLANT
KIT PORT POWER 8FR ISP CVUE (Miscellaneous) ×3 IMPLANT
NEEDLE HYPO 22GX1.5 SAFETY (NEEDLE) ×3 IMPLANT
NS IRRIG 1000ML POUR BTL (IV SOLUTION) ×3 IMPLANT
PACK PORT-A-CATH (MISCELLANEOUS) ×3 IMPLANT
SPONGE LAP 18X18 5 PK (GAUZE/BANDAGES/DRESSINGS) ×3 IMPLANT
SUT MNCRL AB 4-0 PS2 18 (SUTURE) ×3 IMPLANT
SUT PROLENE 2-0 (SUTURE) ×2
SUT PROLENE 2-0 RB1 36X2 ARM (SUTURE) ×1
SUT VIC AB 3-0 SH 27 (SUTURE) ×2
SUT VIC AB 3-0 SH 27X BRD (SUTURE) ×1 IMPLANT
SUTURE PROLEN 2-0 RB1 36X2 ARM (SUTURE) ×1 IMPLANT
SYR 20CC LL (SYRINGE) ×3 IMPLANT
SYR 5ML LL (SYRINGE) ×3 IMPLANT
TOWEL OR 17X26 4PK STRL BLUE (TOWEL DISPOSABLE) IMPLANT

## 2017-07-12 NOTE — Telephone Encounter (Signed)
Pt advised of pre op date/time and sx date. Sx: 07/12/17 with Dr Jeremy Johann placement.  Pre op: Arrive 2 hours prior to sx.

## 2017-07-12 NOTE — Anesthesia Preprocedure Evaluation (Signed)
Anesthesia Evaluation  Patient identified by MRN, date of birth, ID band Patient awake    Reviewed: Allergy & Precautions, H&P , NPO status , Patient's Chart, lab work & pertinent test results  History of Anesthesia Complications (+) PONV, PROLONGED EMERGENCE and history of anesthetic complications  Airway Mallampati: III  TM Distance: <3 FB Neck ROM: limited    Dental  (+) Chipped, Caps, Poor Dentition   Pulmonary neg pulmonary ROS, neg shortness of breath,           Cardiovascular Exercise Tolerance: Good hypertension, (-) angina(-) Past MI and (-) DOE      Neuro/Psych negative neurological ROS  negative psych ROS   GI/Hepatic Neg liver ROS, GERD  Medicated and Controlled,  Endo/Other  negative endocrine ROS  Renal/GU negative Renal ROS  negative genitourinary   Musculoskeletal  (+) Arthritis ,   Abdominal   Peds  Hematology negative hematology ROS (+)   Anesthesia Other Findings Past Medical History: No date: Arthritis     Comment:  bitateral knees and left foot,s/p cortisone injection in              past No date: Chronic constipation No date: Complication of anesthesia     Comment:   slow to wake up  2014: Endometrial cancer ([place])     Comment:  Total Hysterectomy and Rad tx's.  No date: GERD (gastroesophageal reflux disease) No date: Hypertension No date: IBS (irritable bowel syndrome) No date: Menopause     Comment:  age 18 No date: PONV (postoperative nausea and vomiting) 06/07/10: Vitamin D deficiency  Past Surgical History: No date: BUNIONECTOMY 04/04/2017: COLONOSCOPY WITH PROPOFOL; N/A     Comment:  Procedure: COLONOSCOPY WITH PROPOFOL;  Surgeon: [name], MD;  Location: [facility] ENDOSCOPY;  Service:               Endoscopy;  Laterality: N/A; 1971: DILATION AND CURETTAGE OF UTERUS No date: FOOT SURGERY No date: NASAL SINUS SURGERY 06/07/2017: ROBOTIC ASSISTED LAPAROSCOPIC  VENTRAL/INCISIONAL HERNIA  REPAIR; N/A     Comment:  Procedure: ROBOTIC INCISIONAL HERNIA REPAIR;  Surgeon:               Leighton Ruff, MD;  Location: [facility] ORS;  Service: General;              Laterality: N/A; No date: VAGINAL DELIVERY No date: VAGINAL HYSTERECTOMY  BMI    Body Mass Index:  37.77 kg/m      Reproductive/Obstetrics negative OB ROS                             Anesthesia Physical Anesthesia Plan  ASA: III  Anesthesia Plan: General   Post-op Pain Management:    Induction: Intravenous  PONV Risk Score and Plan: Propofol infusion  Airway Management Planned: Natural Airway and Nasal Cannula  Additional Equipment:   Intra-op Plan:   Post-operative Plan:   Informed Consent: I have reviewed the patients History and Physical, chart, labs and discussed the procedure including the risks, benefits and alternatives for the proposed anesthesia with the patient or authorized representative who has indicated his/her understanding and acceptance.   Dental Advisory Given  Plan Discussed with: Anesthesiologist, CRNA and Surgeon  Anesthesia Plan Comments: (Patient consented for risks of anesthesia including but not limited to:  - adverse reactions to medications - risk of intubation if required -  damage to teeth, lips or other oral mucosa - sore throat or hoarseness - Damage to heart, brain, lungs or loss of life  Patient voiced understanding.)        Anesthesia Quick Evaluation  

## 2017-07-12 NOTE — Anesthesia Post-op Follow-up Note (Signed)
Anesthesia QCDR form completed.        

## 2017-07-12 NOTE — Discharge Instructions (Signed)

## 2017-07-12 NOTE — Interval H&P Note (Signed)
History and Physical Interval Note:  07/12/2017 12:18 PM  [name]  has presented today for surgery, with the diagnosis of COLON CANCER  The various methods of treatment have been discussed with the patient and family. After consideration of risks, benefits and other options for treatment, the patient has consented to  Procedure(s): INSERTION PORT-A-CATH (N/A) as a surgical intervention .  The patient's history has been reviewed, patient examined, no change in status, stable for surgery.  I have reviewed the patient's chart and labs.  Questions were answered to the patient's satisfaction.     Theodosia

## 2017-07-12 NOTE — H&P (View-Only) (Signed)
Surgical Consultation  07/11/2017  [name] is an 76 y.o. female.   Chief Complaint  Patient presents with  . Established Patient    Discuss Port Placement     HPI: 76 year old female with recent sigmoid colectomy for cancer and positive nodes in need for chemotherapy and port placement. She has done and recovered very well after surgery and has no abdominal complaints. She does not take any anticoagulants and denies any easy bruising or any manipulation of her central veins. She was me go ahead and do the port placement as soon as possible. Path reviewed as well as previous op reports. nml Pl  Past Medical History:  Diagnosis Date  . Arthritis    bitateral knees and left foot,s/p cortisone injection in past  . Chronic constipation   . Complication of anesthesia     slow to wake up   . Endometrial cancer ([place]) 2014   Total Hysterectomy and Rad tx's.   Marland Kitchen GERD (gastroesophageal reflux disease)   . Hypertension   . IBS (irritable bowel syndrome)   . Menopause    age 57  . PONV (postoperative nausea and vomiting)   . Vitamin D deficiency 06/07/10    Past Surgical History:  Procedure Laterality Date  . BUNIONECTOMY    . COLONOSCOPY WITH PROPOFOL N/A 04/04/2017   Procedure: COLONOSCOPY WITH PROPOFOL;  Surgeon: [name], MD;  [facility] END[facility]COPY;  Service: Endoscopy;  Laterality: N/A;  . DILATION AND CURETTAGE OF UTERUS  1971  . FOOT SURGERY    . NASAL SINUS SURGERY    . ROBOTIC ASSISTED LAPAROSCOPIC VENTRAL/INCISIONAL HERNIA REPAIR N/A 06/07/2017   Procedure: ROBOTIC INCISIONAL HERNIA REPAIR;  Surgeon: Leighton Ruff, MD;  Location: [facility]S;  Service: General;  Laterality: N/A;  . VAGINAL DELIVERY    . VAGINAL HYSTERECTOMY      Family History  Problem Relation Age of Onset  . Heart disease Father 102       massive MI  . Diabetes Father   . Cancer Sister        breast  . Bipolar disorder Sister   . Osteoporosis Sister   . Breast cancer Sister 50  .  Bipolar disorder Maternal Aunt   . Breast cancer Maternal Aunt   . Cancer Maternal Grandfather        colon cancer  . Colon cancer Maternal Grandfather 42  . Osteoporosis Mother   . Colon cancer Mother     Social History:  reports that she has never smoked. She has never used smokeless tobacco. She reports that she drinks alcohol. She reports that she does not use drugs.  Allergies:  Allergies  Allergen Reactions  . Codeine Nausea And Vomiting    Medications reviewed.     ROS Full ROS performed and is otherwise negative other than what is stated in the HPI    BP (!) 169/81   Pulse 62   Temp 98 F (36.7 C) (Oral)   Ht 5\' 5"  (1.651 m)   Wt 103.1 kg (227 lb 3.2 oz)   BMI 37.81 kg/m    Physical Exam  Constitutional: She is oriented to person, place, and time and well-developed, well-nourished, and in no distress. No distress.  Neck: Normal range of motion. No JVD present. No tracheal deviation present.  Cardiovascular: Normal rate.   Pulmonary/Chest: Effort normal. No stridor. No respiratory distress. She has no wheezes. She has no rales. She exhibits no tenderness.  Abdominal: Soft. She exhibits no mass. There  is no tenderness. There is no rebound and no guarding.  Incisions healing well , no infection  Musculoskeletal: Normal range of motion. She exhibits no edema.  Neurological: She is alert and oriented to person, place, and time. Gait normal. GCS score is 15.  Psychiatric: Mood, memory, affect and judgment normal.  Nursing note and vitals reviewed.  Assessment/Plan: Colon cancer need for adjuvant chemotherapy and port placement. Discussed with the patient in detail about the procedure. Risk, benefits and possible complications including but not limited to: Bleeding, pneumothorax, vascular injury and infection. She understands and wishes to proceed. All her questions were answered   Caroleen Hamman, MD [place] Surgeon

## 2017-07-12 NOTE — Op Note (Signed)
  Pre-operative Diagnosis: Colon Ca  Post-operative Diagnosis: Same   Surgeon: Caroleen Hamman, MD FACS  Anesthesia: IV sedation, marcaine .25% w epi and lidocaine 1%  Procedure: right [place]  Port placement under fluoroscopic and under U/S guidance  Findings: Good position of the tip of the catheter by fluoroscopy  Estimated Blood Loss: Minimal         Drains: None         Specimens: None       Complications: none            Procedure Details  The patient was seen again in the Holding Room. The benefits, complications, treatment options, and expected outcomes were discussed with the patient. The risks of bleeding, infection, recurrence of symptoms, failure to resolve symptoms,  thrombosis nonfunction breakage pneumothorax hemopneumothorax any of which could require chest tube or further surgery were reviewed with the patient.   The patient was taken to Operating Room, identified as [name] and the procedure verified.  A Time Out was held and the above information confirmed.  Prior to the induction of general anesthesia, antibiotic prophylaxis was administered. VTE prophylaxis was in place. Appropriate anesthesia was then administered and tolerated well. The chest was prepped with Chloraprep and draped in the sterile fashion. The patient was positioned in the supine position. Then the patient was placed in Trendelenburg position.  Patient was prepped and draped in sterile fashion and in a Trendelenburg position local anesthetic was infiltrated into the skin and subcutaneous tissues in the neck and anterior chest wall. The large bore needle was placed into the internal jugular vein under U/S guidance without difficulty and then the Seldinger wire was advanced. We encounter difficulty with advancing the wire , we were unable to do it after 2 attempts. She did have very small Right IJ.  Because of failed attempt to use the Right IJ we turned our attention to the chest wall and cannulated  the right  vein using the needle guide , we only used one single stick. Good venous non pulsatile blood obtained.   Fluoroscopy was utilized to confirm that the Seldinger wire was in the superior vena cava.  An incision was made and a port pocket developed with blunt and electrocautery dissection. The introducer dilator was placed over the Seldinger wire the wire was removed. The previously flushed catheter was placed into the introducer dilator and the peel-away sheath was removed. The catheter length was confirmed and trimmed utilizing fluoroscopy for proper positioning. The catheter was then attached to the previously flushed port. The port was placed into the pocket. The port was held in with 2-0 Prolenes and flushed for function and heparin locked.  The wound was closed with interrupted 3-0 Vicryl followed by 4-0 subcuticular Monocryl sutures. Dermabond used to coat the skin  Patient was taken to the recovery room in stable condition where a postoperative chest film has been ordered.

## 2017-07-12 NOTE — Transfer of Care (Signed)
Immediate Anesthesia Transfer of Care Note  Patient: [name]  Procedure(s) Performed: Procedure(s): INSERTION PORT-A-CATH (Right)  Patient Location: PACU  Anesthesia Type:MAC  Level of Consciousness: awake  Airway & Oxygen Therapy: Patient Spontanous Breathing  Post-op Assessment: Report given to RN  Post vital signs: Reviewed  Last Vitals:  Vitals:   07/12/17 1040  BP: (!) 160/79  Pulse: 61  Temp: (!) 36.2 C  SpO2: 97%    Last Pain:  Vitals:   07/12/17 1040  TempSrc: Oral  PainSc: 0-No pain         Complications: No apparent anesthesia complications

## 2017-07-13 ENCOUNTER — Inpatient Hospital Stay: Payer: Medicare HMO

## 2017-07-13 ENCOUNTER — Encounter: Payer: Self-pay | Admitting: Surgery

## 2017-07-14 ENCOUNTER — Ambulatory Visit
Admission: RE | Admit: 2017-07-14 | Discharge: 2017-07-14 | Disposition: A | Discharge status: Home / Self Care | Payer: Medicare HMO | Source: Ambulatory Visit | Attending: Oncology | Admitting: Oncology

## 2017-07-14 DIAGNOSIS — I2510 Atherosclerotic heart disease of native coronary artery without angina pectoris: Secondary | ICD-10-CM | POA: Diagnosis not present

## 2017-07-14 DIAGNOSIS — I700 Atherosclerosis of aorta: Secondary | ICD-10-CM | POA: Diagnosis not present

## 2017-07-14 DIAGNOSIS — Z8542 Personal history of malignant neoplasm of other parts of uterus: Secondary | ICD-10-CM | POA: Insufficient documentation

## 2017-07-14 DIAGNOSIS — R911 Solitary pulmonary nodule: Secondary | ICD-10-CM | POA: Diagnosis not present

## 2017-07-14 DIAGNOSIS — C187 Malignant neoplasm of sigmoid colon: Secondary | ICD-10-CM | POA: Diagnosis not present

## 2017-07-14 DIAGNOSIS — Z9889 Other specified postprocedural states: Secondary | ICD-10-CM | POA: Diagnosis not present

## 2017-07-14 DIAGNOSIS — K229 Disease of esophagus, unspecified: Secondary | ICD-10-CM | POA: Insufficient documentation

## 2017-07-14 MED ORDER — IOPAMIDOL (ISOVUE-300) INJECTION 61%
75.0000 mL | Freq: Once | INTRAVENOUS | Status: AC | PRN
  Administered 2017-07-14: 75 mL via INTRAVENOUS

## 2017-07-14 NOTE — Anesthesia Postprocedure Evaluation (Signed)
Anesthesia Post Note  Patient: [name]  Procedure(s) Performed: Procedure(s) (LRB): INSERTION PORT-A-CATH (Right)  Patient location during evaluation: PACU Anesthesia Type: General Level of consciousness: awake and alert Pain management: pain level controlled Vital Signs Assessment: post-procedure vital signs reviewed and stable Respiratory status: spontaneous breathing, nonlabored ventilation, respiratory function stable and patient connected to nasal cannula oxygen Cardiovascular status: blood pressure returned to baseline and stable Postop Assessment: no signs of nausea or vomiting Anesthetic complications: no     Last Vitals:  Vitals:   07/12/17 1520 07/12/17 1546  BP: (!) 156/62 (!) 155/58  Pulse:  (!) 48  Resp:  18  Temp:    SpO2:  99%    Last Pain:  Vitals:   07/12/17 1546  TempSrc:   PainSc: 0-No pain                 Precious Haws Syre Knerr

## 2017-07-20 ENCOUNTER — Other Ambulatory Visit: Payer: Self-pay | Admitting: Oncology

## 2017-07-20 DIAGNOSIS — C187 Malignant neoplasm of sigmoid colon: Secondary | ICD-10-CM

## 2017-07-20 MED ORDER — LIDOCAINE-PRILOCAINE 2.5-2.5 % EX CREA: TOPICAL_CREAM | CUTANEOUS | 3 refills | Status: DC

## 2017-07-20 MED ORDER — PROCHLORPERAZINE MALEATE 10 MG PO TABS: 10.0000 mg | ORAL_TABLET | Freq: Four times a day (QID) | ORAL | 1 refills | Status: DC | PRN

## 2017-07-20 MED ORDER — ONDANSETRON HCL 8 MG PO TABS: 8.0000 mg | ORAL_TABLET | Freq: Two times a day (BID) | ORAL | 1 refills | Status: DC | PRN

## 2017-07-20 NOTE — Progress Notes (Signed)
START ON PATHWAY REGIMEN - Colorectal     A cycle is every 14 days:     Oxaliplatin      Leucovorin      5-Fluorouracil      5-Fluorouracil   **Always confirm dose/schedule in your pharmacy ordering system**    Patient Characteristics: Colon Adjuvant, Stage III, High Risk (T4 or N2) Current evidence of distant metastases<= No AJCC T Category: T4a AJCC N Category: N1c AJCC M Category: M0 AJCC 8 Stage Grouping: IIIB Intent of Therapy: Curative Intent, Discussed with Patient

## 2017-07-21 ENCOUNTER — Other Ambulatory Visit: Payer: Medicare HMO

## 2017-07-21 ENCOUNTER — Ambulatory Visit: Payer: Medicare HMO | Admitting: Oncology

## 2017-07-24 ENCOUNTER — Inpatient Hospital Stay: Payer: Medicare HMO | Attending: Oncology | Admitting: Oncology

## 2017-07-24 ENCOUNTER — Encounter: Payer: Self-pay | Admitting: Oncology

## 2017-07-24 ENCOUNTER — Inpatient Hospital Stay: Payer: Medicare HMO

## 2017-07-24 VITALS — BP 125/71 | HR 56 | Temp 97.8°F | Resp 18 | Wt 225.2 lb

## 2017-07-24 DIAGNOSIS — C187 Malignant neoplasm of sigmoid colon: Secondary | ICD-10-CM | POA: Diagnosis not present

## 2017-07-24 DIAGNOSIS — Z90710 Acquired absence of both cervix and uterus: Secondary | ICD-10-CM | POA: Insufficient documentation

## 2017-07-24 DIAGNOSIS — Z5111 Encounter for antineoplastic chemotherapy: Secondary | ICD-10-CM | POA: Diagnosis not present

## 2017-07-24 DIAGNOSIS — E559 Vitamin D deficiency, unspecified: Secondary | ICD-10-CM | POA: Insufficient documentation

## 2017-07-24 DIAGNOSIS — M899 Disorder of bone, unspecified: Secondary | ICD-10-CM | POA: Diagnosis not present

## 2017-07-24 DIAGNOSIS — K219 Gastro-esophageal reflux disease without esophagitis: Secondary | ICD-10-CM | POA: Diagnosis not present

## 2017-07-24 DIAGNOSIS — K5900 Constipation, unspecified: Secondary | ICD-10-CM | POA: Diagnosis not present

## 2017-07-24 DIAGNOSIS — Z800 Family history of malignant neoplasm of digestive organs: Secondary | ICD-10-CM

## 2017-07-24 DIAGNOSIS — Z79899 Other long term (current) drug therapy: Secondary | ICD-10-CM | POA: Insufficient documentation

## 2017-07-24 DIAGNOSIS — E59 Dietary selenium deficiency: Secondary | ICD-10-CM

## 2017-07-24 DIAGNOSIS — I10 Essential (primary) hypertension: Secondary | ICD-10-CM | POA: Diagnosis not present

## 2017-07-24 DIAGNOSIS — Z8542 Personal history of malignant neoplasm of other parts of uterus: Secondary | ICD-10-CM | POA: Insufficient documentation

## 2017-07-24 DIAGNOSIS — Z803 Family history of malignant neoplasm of breast: Secondary | ICD-10-CM

## 2017-07-24 DIAGNOSIS — M129 Arthropathy, unspecified: Secondary | ICD-10-CM | POA: Diagnosis not present

## 2017-07-24 DIAGNOSIS — Z923 Personal history of irradiation: Secondary | ICD-10-CM | POA: Insufficient documentation

## 2017-07-24 DIAGNOSIS — C189 Malignant neoplasm of colon, unspecified: Secondary | ICD-10-CM | POA: Diagnosis not present

## 2017-07-24 DIAGNOSIS — K589 Irritable bowel syndrome without diarrhea: Secondary | ICD-10-CM | POA: Diagnosis not present

## 2017-07-24 DIAGNOSIS — Z90722 Acquired absence of ovaries, bilateral: Secondary | ICD-10-CM | POA: Insufficient documentation

## 2017-07-24 LAB — COMPREHENSIVE METABOLIC PANEL
ALBUMIN: 3.7 g/dL (ref 3.5–5.0)
ALT: 17 U/L (ref 14–54)
ANION GAP: 6 (ref 5–15)
AST: 27 U/L (ref 15–41)
Alkaline Phosphatase: 102 U/L (ref 38–126)
BUN: 16 mg/dL (ref 6–20)
CALCIUM: 9.1 mg/dL (ref 8.9–10.3)
CHLORIDE: 111 mmol/L (ref 101–111)
CO2: 22 mmol/L (ref 22–32)
Creatinine, Ser: 0.91 mg/dL (ref 0.44–1.00)
GFR calc non Af Amer: 60 mL/min — ABNORMAL LOW (ref >60)
GLUCOSE: 125 mg/dL — AB (ref 65–99)
Potassium: 3.9 mmol/L (ref 3.5–5.1)
SODIUM: 139 mmol/L (ref 135–145)
Total Bilirubin: 0.6 mg/dL (ref 0.3–1.2)
Total Protein: 6.8 g/dL (ref 6.5–8.1)

## 2017-07-24 LAB — CBC WITH DIFFERENTIAL/PLATELET
BASOS PCT: 1 %
Basophils Absolute: 0.1 10*3/uL (ref 0–0.1)
EOS ABS: 0.3 10*3/uL (ref 0–0.7)
EOS PCT: 5 %
HCT: 34.3 % — ABNORMAL LOW (ref 35.0–47.0)
HEMOGLOBIN: 11.6 g/dL — AB (ref 12.0–16.0)
LYMPHS ABS: 1.6 10*3/uL (ref 1.0–3.6)
Lymphocytes Relative: 27 %
MCH: 28.8 pg (ref 26.0–34.0)
MCHC: 33.9 g/dL (ref 32.0–36.0)
MCV: 85.1 fL (ref 80.0–100.0)
MONOS PCT: 9 %
Monocytes Absolute: 0.6 10*3/uL (ref 0.2–0.9)
NEUTROS PCT: 58 %
Neutro Abs: 3.6 10*3/uL (ref 1.4–6.5)
PLATELETS: 226 10*3/uL (ref 150–440)
RBC: 4.03 MIL/uL (ref 3.80–5.20)
RDW: 16 % — AB (ref 11.5–14.5)
WBC: 6.1 10*3/uL (ref 3.6–11.0)

## 2017-07-24 MED ORDER — SODIUM CHLORIDE 0.9 % IV SOLN
2400.0000 mg/m2 | INTRAVENOUS | Status: DC
  Administered 2017-07-24: 5200 mg via INTRAVENOUS
  Filled 2017-07-24: qty 104

## 2017-07-24 MED ORDER — PALONOSETRON HCL INJECTION 0.25 MG/5ML
0.2500 mg | Freq: Once | INTRAVENOUS | Status: AC
  Administered 2017-07-24: 0.25 mg via INTRAVENOUS
  Filled 2017-07-24: qty 5

## 2017-07-24 MED ORDER — DEXAMETHASONE SODIUM PHOSPHATE 100 MG/10ML IJ SOLN
20.0000 mg | Freq: Once | INTRAMUSCULAR | Status: AC
  Administered 2017-07-24: 20 mg via INTRAVENOUS
  Filled 2017-07-24 (×2): qty 2

## 2017-07-24 MED ORDER — SODIUM CHLORIDE 0.9% FLUSH
10.0000 mL | Freq: Once | INTRAVENOUS | Status: AC
  Administered 2017-07-24: 10 mL via INTRAVENOUS
  Filled 2017-07-24: qty 10

## 2017-07-24 MED ORDER — OXALIPLATIN CHEMO INJECTION 100 MG/20ML
150.0000 mg | Freq: Once | INTRAVENOUS | Status: AC
  Administered 2017-07-24: 150 mg via INTRAVENOUS
  Filled 2017-07-24: qty 10

## 2017-07-24 MED ORDER — HEPARIN SOD (PORK) LOCK FLUSH 100 UNIT/ML IV SOLN
500.0000 [IU] | Freq: Once | INTRAVENOUS | Status: DC
  Filled 2017-07-24: qty 5

## 2017-07-24 MED ORDER — LEUCOVORIN CALCIUM INJECTION 350 MG
800.0000 mg | Freq: Once | INTRAVENOUS | Status: AC
  Administered 2017-07-24: 800 mg via INTRAVENOUS
  Filled 2017-07-24: qty 35

## 2017-07-24 MED ORDER — FLUOROURACIL CHEMO INJECTION 2.5 GM/50ML
400.0000 mg/m2 | Freq: Once | INTRAVENOUS | Status: AC
  Administered 2017-07-24: 850 mg via INTRAVENOUS
  Filled 2017-07-24: qty 17

## 2017-07-24 MED ORDER — DEXTROSE 5 % IV SOLN
Freq: Once | INTRAVENOUS | Status: AC
  Administered 2017-07-24: 10:00:00 via INTRAVENOUS
  Filled 2017-07-24: qty 1000

## 2017-07-24 NOTE — Progress Notes (Signed)
Patient here today for follow up.   

## 2017-07-24 NOTE — Progress Notes (Signed)
Hematology/Oncology Follow Up visit. [place] [phone] [phone]  CONSULT NOTE Patient Care Team: Burnard Hawthorne, FNP as PCP - General (Family Medicine) Vladimir Crofts, MD (Neurology) Noreene Filbert, MD as Referring Physician (Radiation Oncology)  CHIEF COMPLAINTS/PURPOSE OF CONSULTATION:  Colon cancer   HISTORY OF PRESENTING ILLNESS:  [name] 76 y.o.  female with PMH listed as below was referred to me for evaluation and management of colon cancer.  She had colonoscopy evaluation due to positive cologuard.  She was found to have a nonobstructing mass approximately 20 cm from the anal verge which biopsy proved to be adenocarcinoma. CT scan showed a large mass from the sigmoid and extends beyond the wall of the colon toward the left into the surrounding fat. Preoperational CEA was normal. Patient underwent robotic sigmoidectomy on 06/07/2017.   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 MLH1  loss of function, PMS2 loss of function.   Today she is here for evaluation for adjuvant chemotherapy. Patient denies SOB, chest pain, abdominal pain, fever or chills. She has a history of endometrial cancer s/p hysterotomy and radiation. She has a family history of colon cancer and breast cancer.   INTERVAL HISTORY Patient presents to evaluation prior to cycle 1 Adjuvant chemotherapy with FOLFOX. She was accompanied by her daughter.    ROS:  Review of Systems  Constitutional: Negative.   HENT:  Negative.   Eyes: Negative.   Respiratory: Negative.   Cardiovascular: Negative.   Gastrointestinal: Negative.   Endocrine: Negative.   Genitourinary: Negative.    Musculoskeletal: Negative.   Skin: Negative.     MEDICAL HISTORY:  Past Medical History:  Diagnosis Date  . Arthritis    bitateral knees and left foot,s/p cortisone  injection in past  . Chronic constipation   . Complication of anesthesia     slow to wake up   . Endometrial cancer ([place]) 2014   Total Hysterectomy and Rad tx's.   Marland Kitchen GERD (gastroesophageal reflux disease)   . Hypertension   . IBS (irritable bowel syndrome)   . Menopause    age 30  . PONV (postoperative nausea and vomiting)   . Vitamin D deficiency 06/07/10    SURGICAL HISTORY: Past Surgical History:  Procedure Laterality Date  . BUNIONECTOMY    . COLONOSCOPY WITH PROPOFOL N/A 04/04/2017   Procedure: COLONOSCOPY WITH PROPOFOL;  Surgeon: [name], MD;  Location: [facility] ENDOSCOPY;  Service: Endoscopy;  Laterality: N/A;  . DILATION AND CURETTAGE OF UTERUS  1971  . FOOT SURGERY    . NASAL SINUS SURGERY    . PORTACATH PLACEMENT Right 07/12/2017   Procedure: INSERTION PORT-A-CATH;  Surgeon: Jules Husbands, MD;  Location: [facility] ORS;  Service: General;  Laterality: Right;  . ROBOTIC ASSISTED LAPAROSCOPIC VENTRAL/INCISIONAL HERNIA REPAIR N/A 06/07/2017   Procedure: ROBOTIC INCISIONAL HERNIA REPAIR;  Surgeon: Leighton Ruff, MD;  Location: [facility] ORS;  Service: General;  Laterality: N/A;  . VAGINAL DELIVERY    . VAGINAL HYSTERECTOMY      SOCIAL HISTORY: Social History   Social History  . Marital status: Divorced    Spouse name: N/A  . Number of children: N/A  . Years of education: N/A   Occupational History  . Not on file.   Social History Main Topics  . Smoking status: Never Smoker  . Smokeless tobacco: Never Used  . Alcohol use Yes  Comment: socially/rarely  . Drug use: No  . Sexual activity: No   Other Topics Concern  . Not on file   Social History Narrative   Exercise- aerobic exercise at home 3-4x week.       Lives in [place]. No pets.    FAMILY HISTORY: Family History  Problem Relation Age of Onset  . Heart disease Father 29       massive MI  . Diabetes Father   . Cancer Sister        breast  . Bipolar disorder Sister   . Osteoporosis Sister   . Breast  cancer Sister 45  . Bipolar disorder Maternal Aunt   . Breast cancer Maternal Aunt   . Cancer Maternal Grandfather        colon cancer  . Colon cancer Maternal Grandfather 50  . Osteoporosis Mother   . Colon cancer Mother     ALLERGIES:  is allergic to codeine.  MEDICATIONS:  Current Outpatient Prescriptions  Medication Sig Dispense Refill  . fluticasone (FLONASE) 50 MCG/ACT nasal spray Place 2 sprays into both nostrils daily. (Patient taking differently: Place 2 sprays into both nostrils daily as needed for allergies. ) 16 g 2  . HYDROcodone-acetaminophen (NORCO/VICODIN) 5-325 MG tablet Take 1 tablet by mouth every 6 (six) hours as needed for moderate pain. 20 tablet 0  . lidocaine-prilocaine (EMLA) cream Apply 1 application topically as needed. 30 g 0  . lidocaine-prilocaine (EMLA) cream Apply to affected area once 30 g 3  . losartan (COZAAR) 50 MG tablet Take 1 tablet (50 mg total) by mouth daily. 90 tablet 3  . Magnesium 400 MG TABS Take 400 mg by mouth daily as needed (irritable bowel syndrome).    . metoprolol succinate (TOPROL-XL) 100 MG 24 hr tablet Take 1 tablet (100 mg total) by mouth daily. 90 tablet 3  . Multiple Vitamin (MULTIVITAMIN WITH MINERALS) TABS tablet Take 1 tablet by mouth daily.    . Multiple Vitamins-Minerals (PRESERVISION AREDS 2) CAPS Take 1 capsule by mouth 2 (two) times daily.    . ondansetron (ZOFRAN) 8 MG tablet Take 1 tablet (8 mg total) by mouth 2 (two) times daily as needed for refractory nausea / vomiting. Start on day 3 after chemotherapy. 30 tablet 1  . OVER THE COUNTER MEDICATION Take 1 capsule by mouth daily after breakfast. IBgard    . Polyethylene Glycol 3350 (MIRALAX PO) Take 17 g by mouth daily as needed (constipation).     . prochlorperazine (COMPAZINE) 10 MG tablet Take 1 tablet (10 mg total) by mouth every 6 (six) hours as needed (Nausea or vomiting). 30 tablet 1  . simvastatin (ZOCOR) 20 MG tablet Take 1 tablet (20 mg total) by mouth every  evening. 90 tablet 4  . topiramate (TOPAMAX) 50 MG tablet Take 1 tablet (50 mg total) by mouth 2 (two) times daily. 180 tablet 2   No current facility-administered medications for this visit.    Facility-Administered Medications Ordered in Other Visits  Medication Dose Route Frequency Provider Last Rate Last Dose  . heparin lock flush 100 unit/mL  500 Units Intravenous Once Earlie Server, MD          .  PHYSICAL EXAMINATION: ECOG PERFORMANCE STATUS: 0 - Asymptomatic Vitals:   07/24/17 0909  BP: 125/71  Pulse: (!) 56  Resp: 18  Temp: 97.8 F (36.6 C)   Filed Weights   07/24/17 0909  Weight: 225 lb 4 oz (102.2 kg)    GENERAL: Well-nourished  well-developed; Alert, no distress and comfortable.  EYES: no pallor or icterus OROPHARYNX: no thrush or ulceration; good dentition  NECK: supple, no masses felt LYMPH:  no palpable lymphadenopathy in the cervical, axillary or inguinal regions LUNGS: clear to auscultation and  No wheeze or crackles. (+) Medi port in place.  HEART/CVS: regular rate & rhythm and no murmurs; No lower extremity edema ABDOMEN: abdomen soft, non-tender and normal bowel sounds. Well healed scar.  Musculoskeletal:no cyanosis of digits and no clubbing  PSYCH: alert & oriented x 3  NEURO: no focal motor/sensory deficits SKIN:  no rashes or significant lesions  LABORATORY DATA:  I have reviewed the data as listed Lab Results  Component Value Date   WBC 7.4 06/10/2017   HGB 10.5 (L) 06/10/2017   HCT 32.8 (L) 06/10/2017   MCV 87.2 06/10/2017   PLT 229 06/10/2017    Recent Labs  08/17/16 0803 04/05/17 1612  06/08/17 0525 06/09/17 0450 06/10/17 0502  NA 143 140  < > 141 143 142  K 4.1 3.6  < > 4.3 3.8 3.8  CL 107 113*  < > 113* 113* 111  CO2 27 20*  < > 19* 24 22  GLUCOSE 111* 91  < > 175* 110* 107*  BUN 12 11  < > 9 <5* 8  CREATININE 0.88 0.88  < > 0.85 0.86 0.78  CALCIUM 9.2 9.2  < > 8.7* 8.6* 8.8*  GFRNONAA  --  >60  < > >60 >60 >60  GFRAA  --  >60   < > >60 >60 >60  PROT 7.4 7.1  --   --   --   --   ALBUMIN 3.8 3.8  --   --   --   --   AST 25 30  --   --   --   --   ALT 22 16  --   --   --   --   ALKPHOS 106 100  --   --   --   --   BILITOT 0.5 0.7  --   --   --   --   < > = values in this interval not displayed.  RADIOGRAPHIC STUDIES: I have personally reviewed the radiological images as listed and agreed with the findings in the report. 04/12/2017 CT abdomen pelvis w contrast Large mass arising from the sigmoid colon. This mass extends beyond the wall of the colon toward the left into the surrounding fat. This lesion does not reach the left pelvic sidewall. No adenopathy or liver lesions evident. No omental lesions appreciable. Sizable midline ventral hernia slightly superior to the umbilicus containing fat and a loop of colon without bowel compromise. Much smaller midline umbilical ventral hernia containing only fat. There is a small hiatal hernia. No bowel obstruction.  No abscess.  Appendix region appears normal. No renal or ureteral calculus.  No hydronephrosis. There is aortoiliac atherosclerosis. There are foci of coronary artery calcification.   ASSESSMENT & PLAN:  76 y.o.female who has Stage IIIB colon cancer.  Cancer Staging Colon cancer [facility]) Staging form: Colon and Rectum, AJCC 8th Edition - Clinical stage from 07/06/2017: Stage IIIB (cT4a, cN1c, cM0) - Unsigned  1. Malignant neoplasm of sigmoid colon ([place])   2. History of endometrial cancer   3. Family history of colon cancer   4. Family history of breast cancer   5. Encounter for antineoplastic chemotherapy    # Pathology result was discussed with patient. Adjuvant chemotherapy is recommended.  [facility] trial with FOLFOX +/- Huey Bienenstock was discussed with patient. I discussed with patient again about the trial and patient declined.   #Standard adjuvant chemotherapy is recommended. I would recommend ajuvant chemotherapy with FOLFOX which would be 5FU  with Oxalipaltin and leucovorin IV Q2 weeks for 12 treatments. I explained to the patient the risks and benefits of adjuvant chemotherapy including all but not limited to nausea, vomiting, hair loss, hand and foot syndrome, neuropathy, diarrhea, low blood counts and risk of infection and hospitalization, even death. Risk of neuropathy is associated with Oxaliplatin. Patient understands and agrees to proceed as planned. # Intent of treatment: curative  Given her age, will start with FOLFOX, however if she develops toxicity, will omit Oxaliplatin.   # Ok to proceed cycle 1 FOLFOX (oxaliplatin reduce to 18m/m2 due to her age). CBC CMP CEA prior to chemo #  CT chest reviewed. Small lung nodule (514m to watch.    #  T4 tumor, will refer to RadOnc to discuss RT after chemotherapy.Will revisit this next visit.   #  Significant family history suspecting germline mutation, ?lynch syndrome. Genetic test panel sent.   All questions were answered. The patient knows to call the clinic with any problems questions or concerns.  Return of visit: 2 weeks on cycle 2 FOLFOX  ZhEarlie ServerMD, PhD Hematology Oncology [facility] [id number]/08/2017

## 2017-07-25 LAB — CEA: CEA: 1.6 ng/mL (ref 0.0–4.7)

## 2017-07-26 ENCOUNTER — Inpatient Hospital Stay: Payer: Medicare HMO

## 2017-07-26 ENCOUNTER — Encounter: Payer: Self-pay | Admitting: *Deleted

## 2017-07-26 VITALS — BP 112/71 | HR 64 | Resp 18

## 2017-07-26 DIAGNOSIS — M129 Arthropathy, unspecified: Secondary | ICD-10-CM | POA: Diagnosis not present

## 2017-07-26 DIAGNOSIS — I10 Essential (primary) hypertension: Secondary | ICD-10-CM | POA: Diagnosis not present

## 2017-07-26 DIAGNOSIS — K589 Irritable bowel syndrome without diarrhea: Secondary | ICD-10-CM | POA: Diagnosis not present

## 2017-07-26 DIAGNOSIS — Z5111 Encounter for antineoplastic chemotherapy: Secondary | ICD-10-CM | POA: Diagnosis not present

## 2017-07-26 DIAGNOSIS — K5900 Constipation, unspecified: Secondary | ICD-10-CM | POA: Diagnosis not present

## 2017-07-26 DIAGNOSIS — C801 Malignant (primary) neoplasm, unspecified: Secondary | ICD-10-CM

## 2017-07-26 DIAGNOSIS — E559 Vitamin D deficiency, unspecified: Secondary | ICD-10-CM | POA: Diagnosis not present

## 2017-07-26 DIAGNOSIS — M899 Disorder of bone, unspecified: Secondary | ICD-10-CM | POA: Diagnosis not present

## 2017-07-26 DIAGNOSIS — C187 Malignant neoplasm of sigmoid colon: Secondary | ICD-10-CM | POA: Diagnosis not present

## 2017-07-26 DIAGNOSIS — K219 Gastro-esophageal reflux disease without esophagitis: Secondary | ICD-10-CM | POA: Diagnosis not present

## 2017-07-26 MED ORDER — SODIUM CHLORIDE 0.9% FLUSH
10.0000 mL | INTRAVENOUS | Status: DC | PRN
  Administered 2017-07-26: 10 mL via INTRAVENOUS
  Filled 2017-07-26: qty 10

## 2017-07-26 MED ORDER — HEPARIN SOD (PORK) LOCK FLUSH 100 UNIT/ML IV SOLN
500.0000 [IU] | Freq: Once | INTRAVENOUS | Status: AC
  Administered 2017-07-26: 500 [IU] via INTRAVENOUS
  Filled 2017-07-26: qty 5

## 2017-07-28 ENCOUNTER — Encounter: Payer: Self-pay | Admitting: Family

## 2017-08-07 ENCOUNTER — Encounter: Payer: Self-pay | Admitting: Oncology

## 2017-08-07 ENCOUNTER — Inpatient Hospital Stay (HOSPITAL_BASED_OUTPATIENT_CLINIC_OR_DEPARTMENT_OTHER): Payer: Medicare HMO | Admitting: Oncology

## 2017-08-07 ENCOUNTER — Inpatient Hospital Stay: Payer: Medicare HMO

## 2017-08-07 VITALS — BP 116/77 | HR 72 | Temp 97.7°F | Wt 225.2 lb

## 2017-08-07 DIAGNOSIS — C187 Malignant neoplasm of sigmoid colon: Secondary | ICD-10-CM

## 2017-08-07 DIAGNOSIS — I10 Essential (primary) hypertension: Secondary | ICD-10-CM | POA: Diagnosis not present

## 2017-08-07 DIAGNOSIS — K589 Irritable bowel syndrome without diarrhea: Secondary | ICD-10-CM | POA: Diagnosis not present

## 2017-08-07 DIAGNOSIS — Z923 Personal history of irradiation: Secondary | ICD-10-CM | POA: Diagnosis not present

## 2017-08-07 DIAGNOSIS — Z79899 Other long term (current) drug therapy: Secondary | ICD-10-CM | POA: Diagnosis not present

## 2017-08-07 DIAGNOSIS — C189 Malignant neoplasm of colon, unspecified: Secondary | ICD-10-CM | POA: Diagnosis not present

## 2017-08-07 DIAGNOSIS — Z800 Family history of malignant neoplasm of digestive organs: Secondary | ICD-10-CM

## 2017-08-07 DIAGNOSIS — Z8542 Personal history of malignant neoplasm of other parts of uterus: Secondary | ICD-10-CM

## 2017-08-07 DIAGNOSIS — M129 Arthropathy, unspecified: Secondary | ICD-10-CM | POA: Diagnosis not present

## 2017-08-07 DIAGNOSIS — G620 Drug-induced polyneuropathy: Secondary | ICD-10-CM

## 2017-08-07 DIAGNOSIS — T451X5A Adverse effect of antineoplastic and immunosuppressive drugs, initial encounter: Secondary | ICD-10-CM

## 2017-08-07 DIAGNOSIS — K5900 Constipation, unspecified: Secondary | ICD-10-CM | POA: Diagnosis not present

## 2017-08-07 DIAGNOSIS — Z5111 Encounter for antineoplastic chemotherapy: Secondary | ICD-10-CM | POA: Diagnosis not present

## 2017-08-07 DIAGNOSIS — Z803 Family history of malignant neoplasm of breast: Secondary | ICD-10-CM

## 2017-08-07 DIAGNOSIS — M899 Disorder of bone, unspecified: Secondary | ICD-10-CM | POA: Diagnosis not present

## 2017-08-07 DIAGNOSIS — K219 Gastro-esophageal reflux disease without esophagitis: Secondary | ICD-10-CM | POA: Diagnosis not present

## 2017-08-07 DIAGNOSIS — E559 Vitamin D deficiency, unspecified: Secondary | ICD-10-CM | POA: Diagnosis not present

## 2017-08-07 LAB — CBC WITH DIFFERENTIAL/PLATELET
BASOS ABS: 0 10*3/uL (ref 0–0.1)
BASOS PCT: 1 %
EOS ABS: 0.3 10*3/uL (ref 0–0.7)
Eosinophils Relative: 7 %
HEMATOCRIT: 33.4 % — AB (ref 35.0–47.0)
HEMOGLOBIN: 11.4 g/dL — AB (ref 12.0–16.0)
Lymphocytes Relative: 40 %
Lymphs Abs: 1.5 10*3/uL (ref 1.0–3.6)
MCH: 29.3 pg (ref 26.0–34.0)
MCHC: 34.2 g/dL (ref 32.0–36.0)
MCV: 85.6 fL (ref 80.0–100.0)
Monocytes Absolute: 0.4 10*3/uL (ref 0.2–0.9)
Monocytes Relative: 10 %
NEUTROS ABS: 1.6 10*3/uL (ref 1.4–6.5)
NEUTROS PCT: 42 %
Platelets: 166 10*3/uL (ref 150–440)
RBC: 3.91 MIL/uL (ref 3.80–5.20)
RDW: 15.7 % — AB (ref 11.5–14.5)
WBC: 3.8 10*3/uL (ref 3.6–11.0)

## 2017-08-07 LAB — COMPREHENSIVE METABOLIC PANEL
ALBUMIN: 3.5 g/dL (ref 3.5–5.0)
ALT: 19 U/L (ref 14–54)
ANION GAP: 5 (ref 5–15)
AST: 27 U/L (ref 15–41)
Alkaline Phosphatase: 103 U/L (ref 38–126)
BILIRUBIN TOTAL: 0.6 mg/dL (ref 0.3–1.2)
BUN: 16 mg/dL (ref 6–20)
CO2: 24 mmol/L (ref 22–32)
Calcium: 9.1 mg/dL (ref 8.9–10.3)
Chloride: 110 mmol/L (ref 101–111)
Creatinine, Ser: 0.87 mg/dL (ref 0.44–1.00)
GFR calc non Af Amer: >60 mL/min (ref >60)
GLUCOSE: 157 mg/dL — AB (ref 65–99)
POTASSIUM: 3.7 mmol/L (ref 3.5–5.1)
SODIUM: 139 mmol/L (ref 135–145)
TOTAL PROTEIN: 6.5 g/dL (ref 6.5–8.1)

## 2017-08-07 MED ORDER — FLUOROURACIL CHEMO INJECTION 2.5 GM/50ML
400.0000 mg/m2 | Freq: Once | INTRAVENOUS | Status: AC
  Administered 2017-08-07: 850 mg via INTRAVENOUS
  Filled 2017-08-07: qty 17

## 2017-08-07 MED ORDER — FLUOROURACIL CHEMO INJECTION 5 GM/100ML
2400.0000 mg/m2 | INTRAVENOUS | Status: DC
  Administered 2017-08-07: 5200 mg via INTRAVENOUS
  Filled 2017-08-07: qty 104

## 2017-08-07 MED ORDER — DEXTROSE 5 % IV SOLN
Freq: Once | INTRAVENOUS | Status: AC
  Administered 2017-08-07: 10:00:00 via INTRAVENOUS
  Filled 2017-08-07: qty 1000

## 2017-08-07 MED ORDER — SODIUM CHLORIDE 0.9% FLUSH
10.0000 mL | INTRAVENOUS | Status: DC | PRN
  Administered 2017-08-07: 10 mL via INTRAVENOUS
  Filled 2017-08-07: qty 10

## 2017-08-07 MED ORDER — LEUCOVORIN CALCIUM INJECTION 350 MG
800.0000 mg | Freq: Once | INTRAVENOUS | Status: AC
  Administered 2017-08-07: 800 mg via INTRAVENOUS
  Filled 2017-08-07: qty 35

## 2017-08-07 MED ORDER — SODIUM CHLORIDE 0.9% FLUSH
10.0000 mL | INTRAVENOUS | Status: DC | PRN
  Filled 2017-08-07: qty 10

## 2017-08-07 MED ORDER — HEPARIN SOD (PORK) LOCK FLUSH 100 UNIT/ML IV SOLN: 500.0000 [IU] | Freq: Once | INTRAVENOUS | Status: DC

## 2017-08-07 MED ORDER — HEPARIN SOD (PORK) LOCK FLUSH 100 UNIT/ML IV SOLN: 500.0000 [IU] | Freq: Once | INTRAVENOUS | Status: DC | PRN

## 2017-08-07 MED ORDER — PALONOSETRON HCL INJECTION 0.25 MG/5ML
0.2500 mg | Freq: Once | INTRAVENOUS | Status: AC
Start: 2017-08-07 — End: 2017-08-07
  Administered 2017-08-07: 0.25 mg via INTRAVENOUS
  Filled 2017-08-07: qty 5

## 2017-08-07 MED ORDER — OXALIPLATIN CHEMO INJECTION 100 MG/20ML
150.0000 mg | Freq: Once | INTRAVENOUS | Status: AC
  Administered 2017-08-07: 150 mg via INTRAVENOUS
  Filled 2017-08-07: qty 20

## 2017-08-07 MED ORDER — SODIUM CHLORIDE 0.9 % IV SOLN
20.0000 mg | Freq: Once | INTRAVENOUS | Status: AC
  Administered 2017-08-07: 20 mg via INTRAVENOUS
  Filled 2017-08-07: qty 2

## 2017-08-07 NOTE — Progress Notes (Addendum)
Hematology/Oncology Follow Up visit. [place] Telephone:([phone]) [phone] Fax:(336) [phone]  CONSULT NOTE Patient Care Team: Burnard Hawthorne, FNP as PCP - General (Family Medicine) Vladimir Crofts, MD (Neurology) Noreene Filbert, MD as Referring Physician (Radiation Oncology)  PURPOSE OF CONSULTATION:  Follow up for treatment of Colon cancer   HISTORY OF PRESENTING ILLNESS:  [name] 76 y.o.  female with PMH listed as below was referred to me for evaluation and management of colon cancer.  She had colonoscopy evaluation due to positive cologuard.  She was found to have a nonobstructing mass approximately 20 cm from the anal verge which biopsy proved to be adenocarcinoma. CT scan showed a large mass from the sigmoid and extends beyond the wall of the colon toward the left into the surrounding fat. Preoperational CEA was normal. Patient underwent robotic sigmoidectomy on 06/07/2017.   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 MLH1  loss of function, PMS2 loss of function.   Today she is here for evaluation for adjuvant chemotherapy. Patient denies SOB, chest pain, abdominal pain, fever or chills. She has a history of endometrial cancer s/p hysterotomy and radiation. She has a family history of colon cancer and breast cancer.   INTERVAL HISTORY 76 yo female with above history presents to evaluation prior to cycle 2 Adjuvant chemotherapy with FOLFOX. She had a few loose bowel movements on Day 6 and again on Day 11,improved with imodium. Currently no diarrhea. She denies any finger to toe numbness or tingling. She has no taste and not eating well. Weight is stable. Her lost of taste is chronic problem for about 2 years due to chronic sinusitis.    ROS:  Review of Systems  Constitutional: Positive for fatigue.  HENT:  Negative.   Eyes: Negative.     Respiratory: Negative.   Cardiovascular: Negative.   Gastrointestinal: Negative.   Endocrine: Negative.   Genitourinary: Negative.    Musculoskeletal: Negative.   Skin: Negative.   Neurological: Positive for numbness.    MEDICAL HISTORY:  Past Medical History:  Diagnosis Date  . Arthritis    bitateral knees and left foot,s/p cortisone injection in past  . Chronic constipation   . Complication of anesthesia     slow to wake up   . Endometrial cancer ([facility]) 2014   Total Hysterectomy and Rad tx's.   Marland Kitchen GERD (gastroesophageal reflux disease)   . Hypertension   . IBS (irritable bowel syndrome)   . Menopause    age 61  . PONV (postoperative nausea and vomiting)   . Vitamin D deficiency 06/07/10    SURGICAL HISTORY: Past Surgical History:  Procedure Laterality Date  . BUNIONECTOMY    . COLONOSCOPY WITH PROPOFOL N/A 04/04/2017   Procedure: COLONOSCOPY WITH PROPOFOL;  Surgeon: [name], MD;  Location: [facility] ENDOSCOPY;  Service: Endoscopy;  Laterality: N/A;  . DILATION AND CURETTAGE OF UTERUS  1971  . FOOT SURGERY    . NASAL SINUS SURGERY    . PORTACATH PLACEMENT Right 07/12/2017   Procedure: INSERTION PORT-A-CATH;  Surgeon: Jules Husbands, MD;  Location: [facility] ORS;  Service: General;  Laterality: Right;  . ROBOTIC ASSISTED LAPAROSCOPIC VENTRAL/INCISIONAL HERNIA REPAIR N/A 06/07/2017   Procedure: ROBOTIC INCISIONAL HERNIA REPAIR;  Surgeon: Leighton Ruff, MD;  Location: [facility] ORS;  Service: General;  Laterality: N/A;  . VAGINAL DELIVERY    . VAGINAL HYSTERECTOMY      SOCIAL  HISTORY: Social History   Social History  . Marital status: Divorced    Spouse name: N/A  . Number of children: N/A  . Years of education: N/A   Occupational History  . Not on file.   Social History Main Topics  . Smoking status: Never Smoker  . Smokeless tobacco: Never Used  . Alcohol use Yes     Comment: socially/rarely  . Drug use: No  . Sexual activity: No   Other Topics Concern  . Not on  file   Social History Narrative   Exercise- aerobic exercise at home 3-4x week.       Lives in [place]. No pets.    FAMILY HISTORY: Family History  Problem Relation Age of Onset  . Heart disease Father 59       massive MI  . Diabetes Father   . Cancer Sister        breast  . Bipolar disorder Sister   . Osteoporosis Sister   . Breast cancer Sister 53  . Bipolar disorder Maternal Aunt   . Breast cancer Maternal Aunt   . Cancer Maternal Grandfather        colon cancer  . Colon cancer Maternal Grandfather 18  . Osteoporosis Mother   . Colon cancer Mother     ALLERGIES:  is allergic to codeine.  MEDICATIONS:  Current Outpatient Prescriptions  Medication Sig Dispense Refill  . fluticasone (FLONASE) 50 MCG/ACT nasal spray Place 2 sprays into both nostrils daily. (Patient taking differently: Place 2 sprays into both nostrils daily as needed for allergies. ) 16 g 2  . lidocaine-prilocaine (EMLA) cream Apply 1 application topically as needed. 30 g 0  . lidocaine-prilocaine (EMLA) cream Apply to affected area once 30 g 3  . losartan (COZAAR) 50 MG tablet Take 1 tablet (50 mg total) by mouth daily. 90 tablet 3  . Magnesium 400 MG TABS Take 400 mg by mouth daily as needed (irritable bowel syndrome).    . metoprolol succinate (TOPROL-XL) 100 MG 24 hr tablet Take 1 tablet (100 mg total) by mouth daily. 90 tablet 3  . Multiple Vitamin (MULTIVITAMIN WITH MINERALS) TABS tablet Take 1 tablet by mouth daily.    . Multiple Vitamins-Minerals (PRESERVISION AREDS 2) CAPS Take 1 capsule by mouth 2 (two) times daily.    . ondansetron (ZOFRAN) 8 MG tablet Take 1 tablet (8 mg total) by mouth 2 (two) times daily as needed for refractory nausea / vomiting. Start on day 3 after chemotherapy. 30 tablet 1  . OVER THE COUNTER MEDICATION Take 1 capsule by mouth daily after breakfast. IBgard    . Polyethylene Glycol 3350 (MIRALAX PO) Take 17 g by mouth daily as needed (constipation).     .  prochlorperazine (COMPAZINE) 10 MG tablet Take 1 tablet (10 mg total) by mouth every 6 (six) hours as needed (Nausea or vomiting). 30 tablet 1  . simvastatin (ZOCOR) 20 MG tablet Take 1 tablet (20 mg total) by mouth every evening. 90 tablet 4  . topiramate (TOPAMAX) 50 MG tablet Take 1 tablet (50 mg total) by mouth 2 (two) times daily. 180 tablet 2   No current facility-administered medications for this visit.    Facility-Administered Medications Ordered in Other Visits  Medication Dose Route Frequency Provider Last Rate Last Dose  . heparin lock flush 100 unit/mL  500 Units Intravenous Once Earlie Server, MD      . sodium chloride flush (NS) 0.9 % injection 10 mL  10 mL Intravenous  PRN Earlie Server, MD   10 mL at 08/07/17 0818      .  PHYSICAL EXAMINATION: ECOG PERFORMANCE STATUS: 0 - Asymptomatic Vitals:   08/07/17 0850  BP: 116/77  Pulse: 72  Temp: 97.7 F (36.5 C)   Filed Weights   08/07/17 0850  Weight: 225 lb 4 oz (102.2 kg)    GENERAL: Well-nourished well-developed; Alert, no distress and comfortable.  EYES: no pallor or icterus OROPHARYNX: no thrush or ulceration; good dentition  NECK: supple, no masses felt LYMPH:  no palpable lymphadenopathy in the cervical, axillary or inguinal regions LUNGS: clear to auscultation and  No wheeze or crackles. (+) Medi port in place, erythema or discharge.  HEART/CVS: regular rate & rhythm and no murmurs; No lower extremity edema ABDOMEN: abdomen soft, non-tender and normal bowel sounds. Well healed scar.  Musculoskeletal:no cyanosis of digits and no clubbing  PSYCH: alert & oriented x 3  NEURO: no focal motor/sensory deficits SKIN:  no rashes or significant lesions  LABORATORY DATA:  I have reviewed the data as listed Lab Results  Component Value Date   WBC 6.1 07/24/2017   HGB 11.6 (L) 07/24/2017   HCT 34.3 (L) 07/24/2017   MCV 85.1 07/24/2017   PLT 226 07/24/2017    Recent Labs  08/17/16 0803 04/05/17 1612  06/09/17 0450  06/10/17 0502 07/24/17 0833  NA 143 140  < > 143 142 139  K 4.1 3.6  < > 3.8 3.8 3.9  CL 107 113*  < > 113* 111 111  CO2 27 20*  < > _0 GLUCOSE 111* 91  < > 110* 107* 125*  BUN 12 11  < > <5* 8 16  CREATININE 0.88 0.88  < > 0.86 0.78 0.91  CALCIUM 9.2 9.2  < > 8.6* 8.8* 9.1  GFRNONAA  --  >60  < > >60 >60 60*  GFRAA  --  >60  < > >60 >60 >60  PROT 7.4 7.1  --   --   --  6.8  ALBUMIN 3.8 3.8  --   --   --  3.7  AST 25 30  --   --   --  27  ALT 22 16  --   --   --  17  ALKPHOS 106 100  --   --   --  102  BILITOT 0.5 0.7  --   --   --  0.6  < > = values in this interval not displayed.  RADIOGRAPHIC STUDIES: I have personally reviewed the radiological images as listed and agreed with the findings in the report. 04/12/2017 CT abdomen pelvis w contrast Large mass arising from the sigmoid colon. This mass extends beyond the wall of the colon toward the left into the surrounding fat. This lesion does not reach the left pelvic sidewall. No adenopathy or liver lesions evident. No omental lesions appreciable. Sizable midline ventral hernia slightly superior to the umbilicus containing fat and a loop of colon without bowel compromise. Much smaller midline umbilical ventral hernia containing only fat. There is a small hiatal hernia. No bowel obstruction.  No abscess.  Appendix region appears normal. No renal or ureteral calculus.  No hydronephrosis. There is aortoiliac atherosclerosis. There are foci of coronary artery calcification.   ASSESSMENT & PLAN:  76 y.o.female who has Stage IIIB colon cancer.  Cancer Staging Colon cancer [facility]) Staging form: Colon and Rectum, AJCC 8th Edition - Clinical stage from 07/06/2017: Stage IIIB (cT4a, cN1c,  cM0) - Unsigned  1. Malignant neoplasm of sigmoid colon ([place])   2. Encounter for antineoplastic chemotherapy   3. History of endometrial cancer   4. Family history of colon cancer   5. Family history of breast cancer   6. Neuropathy due  to chemotherapeutic drug ([place])     1&2:  Sigmoid colon cancer:  Ok to proceed cycle 1 FOLFOX (oxaliplatin reduce to 24m/m2 due to her age). CBC CMP prior to chemo Dietitian consultation.  T4 tumor, will refer to RadOnc to discuss RT after chemotherapy. She had radiation previously due to endometrial cancer. Defer Dr.Chrystal to decide if she can still receive additional RT.  # Patient asked if her dentist can out her crown onto her implanted post. I advise patient to have dental work done close to next treatment so that her counts would have recovered by then. Prefer not to have invasive dental work. She voices understanding.   3,4,5  Significant family history suspecting germline mutation, ?lynch syndrome. Genetic test panel sent, awaiting for results.  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 Prescribe Vitamin B6 supplementation. She did not tolerate Neurontin in the past due to memory loss.  All questions were answered. The patient knows to call the clinic with any problems questions or concerns.  Return of visit: 2 weeks on cycle 3 FOLFOX w cbc, cmp  ZEarlie Server MD, PhD Hematology Oncology CArlington Day Surgeryat AGlaston[facility] Endoscopy CenterPager- [phone]/24/2018

## 2017-08-07 NOTE — Addendum Note (Signed)
Addended by: Vernetta Honey on: 08/07/2017 10:39 AM   Modules accepted: Orders

## 2017-08-07 NOTE — Progress Notes (Signed)
Patient states appetite has decreased, loss of smell and taste.

## 2017-08-09 ENCOUNTER — Inpatient Hospital Stay: Payer: Medicare HMO

## 2017-08-09 VITALS — BP 127/71 | HR 76 | Resp 20

## 2017-08-09 DIAGNOSIS — K219 Gastro-esophageal reflux disease without esophagitis: Secondary | ICD-10-CM | POA: Diagnosis not present

## 2017-08-09 DIAGNOSIS — K589 Irritable bowel syndrome without diarrhea: Secondary | ICD-10-CM | POA: Diagnosis not present

## 2017-08-09 DIAGNOSIS — E559 Vitamin D deficiency, unspecified: Secondary | ICD-10-CM | POA: Diagnosis not present

## 2017-08-09 DIAGNOSIS — C187 Malignant neoplasm of sigmoid colon: Secondary | ICD-10-CM

## 2017-08-09 DIAGNOSIS — I10 Essential (primary) hypertension: Secondary | ICD-10-CM | POA: Diagnosis not present

## 2017-08-09 DIAGNOSIS — M129 Arthropathy, unspecified: Secondary | ICD-10-CM | POA: Diagnosis not present

## 2017-08-09 DIAGNOSIS — M899 Disorder of bone, unspecified: Secondary | ICD-10-CM | POA: Diagnosis not present

## 2017-08-09 DIAGNOSIS — K5900 Constipation, unspecified: Secondary | ICD-10-CM | POA: Diagnosis not present

## 2017-08-09 DIAGNOSIS — Z5111 Encounter for antineoplastic chemotherapy: Secondary | ICD-10-CM | POA: Diagnosis not present

## 2017-08-09 MED ORDER — HEPARIN SOD (PORK) LOCK FLUSH 100 UNIT/ML IV SOLN
500.0000 [IU] | Freq: Once | INTRAVENOUS | Status: AC | PRN
  Administered 2017-08-09: 500 [IU]
  Filled 2017-08-09: qty 5

## 2017-08-10 ENCOUNTER — Inpatient Hospital Stay: Payer: Medicare HMO

## 2017-08-10 NOTE — Progress Notes (Signed)
Nutrition Assessment   Reason for Assessment:   Patient requested to meet with RD regarding poor appetite.  ASSESSMENT:  76 year old female with colon cancer receiving chemotherapy.  Patient s/p robotic sigmoidectomy on 06/07/2017, chronic constipation, GERD, IBS, HLD, endometrial cancer s/p hysterectomy and radiation  Patient seen in clinic today.  Patient reports that since starting chemotherapy she has had poor appetite, food does not looking appealing.  Reports that she has had a chronic problem due to chronic sinusitis of no taste or smell of food.  Patient reports that she makes herself eat even though she can't taste the food.  Reports that typical day she may eat a bagel for breakfast or cereal with almond milk or eggs, for lunch fruit or more cereal and milk or yogurt, and dinner includes lean cuisine meal, salad or chicken salad, tuna salad sandwich.  Patient has not tried oral nutrition supplements but wanting to try them.  No problems with nausea per patient.    Nutrition Focused Physical Exam: deferred  Medications: Mag, MVI, zofran, miralax, compazine  Labs: glucose 157  Anthropometrics:   Height: 65 inches Weight: 225 lb 4 oz on 9/24 UBW: Patient reports 5 lb weight loss since cancer diagnosis BMI: 37  2% weight loss in the last 3 months, not significant   Estimated Energy Needs  Kcals: 2200-2300 calories/d Protein: 85-114 g/d Fluid: > 2.2 L/d  NUTRITION DIAGNOSIS: Inadequate oral intake related to cancer treatment therapy as evidenced by poor appetite, 2% weight loss   MALNUTRITION DIAGNOSIS: none at this time   INTERVENTION:   Discussed importance of good nutrition during treatment. Discussed ways to increase nutrition during meal times and discussed ways to add more calories and protein.  Discussed oral nutrition supplements and difference between supplements. Samples given to patient to try today and coupons.  Offered suggestions for decreased taste  and smell and fact sheet offered but patient denied fact sheet.   Contact information given and patient knows to call with questions or concerns.     MONITORING, EVALUATION, GOAL: weight trends, intake   NEXT VISIT: October 8 during infusion  Christino Mcglinchey B. Zenia Resides, [place], [place] Registered Dietitian [phone] (pager)

## 2017-08-21 ENCOUNTER — Inpatient Hospital Stay: Payer: Medicare HMO

## 2017-08-21 ENCOUNTER — Inpatient Hospital Stay: Payer: Medicare HMO | Attending: Oncology | Admitting: Oncology

## 2017-08-21 VITALS — BP 113/63 | HR 62 | Temp 98.0°F | Wt 221.0 lb

## 2017-08-21 DIAGNOSIS — C541 Malignant neoplasm of endometrium: Secondary | ICD-10-CM | POA: Diagnosis not present

## 2017-08-21 DIAGNOSIS — K589 Irritable bowel syndrome without diarrhea: Secondary | ICD-10-CM | POA: Insufficient documentation

## 2017-08-21 DIAGNOSIS — C187 Malignant neoplasm of sigmoid colon: Secondary | ICD-10-CM

## 2017-08-21 DIAGNOSIS — K219 Gastro-esophageal reflux disease without esophagitis: Secondary | ICD-10-CM | POA: Diagnosis not present

## 2017-08-21 DIAGNOSIS — Z5111 Encounter for antineoplastic chemotherapy: Secondary | ICD-10-CM | POA: Diagnosis not present

## 2017-08-21 DIAGNOSIS — R739 Hyperglycemia, unspecified: Secondary | ICD-10-CM | POA: Insufficient documentation

## 2017-08-21 DIAGNOSIS — D492 Neoplasm of unspecified behavior of bone, soft tissue, and skin: Secondary | ICD-10-CM | POA: Insufficient documentation

## 2017-08-21 DIAGNOSIS — T451X5A Adverse effect of antineoplastic and immunosuppressive drugs, initial encounter: Secondary | ICD-10-CM

## 2017-08-21 DIAGNOSIS — E559 Vitamin D deficiency, unspecified: Secondary | ICD-10-CM | POA: Diagnosis not present

## 2017-08-21 DIAGNOSIS — D701 Agranulocytosis secondary to cancer chemotherapy: Secondary | ICD-10-CM | POA: Insufficient documentation

## 2017-08-21 DIAGNOSIS — K5909 Other constipation: Secondary | ICD-10-CM | POA: Insufficient documentation

## 2017-08-21 DIAGNOSIS — I10 Essential (primary) hypertension: Secondary | ICD-10-CM | POA: Insufficient documentation

## 2017-08-21 DIAGNOSIS — G620 Drug-induced polyneuropathy: Secondary | ICD-10-CM

## 2017-08-21 DIAGNOSIS — T451X5S Adverse effect of antineoplastic and immunosuppressive drugs, sequela: Secondary | ICD-10-CM | POA: Diagnosis not present

## 2017-08-21 DIAGNOSIS — Z79899 Other long term (current) drug therapy: Secondary | ICD-10-CM | POA: Diagnosis not present

## 2017-08-21 LAB — CBC WITH DIFFERENTIAL/PLATELET
Basophils Absolute: 0.1 10*3/uL (ref 0–0.1)
Basophils Relative: 2 %
EOS ABS: 0 10*3/uL (ref 0–0.7)
Eosinophils Relative: 2 %
HEMATOCRIT: 34.5 % — AB (ref 35.0–47.0)
HEMOGLOBIN: 11.5 g/dL — AB (ref 12.0–16.0)
LYMPHS ABS: 1.4 10*3/uL (ref 1.0–3.6)
LYMPHS PCT: 56 %
MCH: 29.2 pg (ref 26.0–34.0)
MCHC: 33.4 g/dL (ref 32.0–36.0)
MCV: 87.4 fL (ref 80.0–100.0)
MONOS PCT: 15 %
Monocytes Absolute: 0.4 10*3/uL (ref 0.2–0.9)
NEUTROS PCT: 25 %
Neutro Abs: 0.6 10*3/uL — ABNORMAL LOW (ref 1.4–6.5)
Platelets: 169 10*3/uL (ref 150–440)
RBC: 3.95 MIL/uL (ref 3.80–5.20)
RDW: 16.3 % — ABNORMAL HIGH (ref 11.5–14.5)
WBC: 2.5 10*3/uL — ABNORMAL LOW (ref 3.6–11.0)

## 2017-08-21 LAB — COMPREHENSIVE METABOLIC PANEL WITH GFR
ALT: 20 U/L (ref 14–54)
AST: 31 U/L (ref 15–41)
Albumin: 3.5 g/dL (ref 3.5–5.0)
Alkaline Phosphatase: 121 U/L (ref 38–126)
Anion gap: 6 (ref 5–15)
BUN: 14 mg/dL (ref 6–20)
CO2: 25 mmol/L (ref 22–32)
Calcium: 9.1 mg/dL (ref 8.9–10.3)
Chloride: 108 mmol/L (ref 101–111)
Creatinine, Ser: 0.93 mg/dL (ref 0.44–1.00)
GFR calc Af Amer: >60 mL/min
GFR calc non Af Amer: 58 mL/min — ABNORMAL LOW
Glucose, Bld: 168 mg/dL — ABNORMAL HIGH (ref 65–99)
Potassium: 3.5 mmol/L (ref 3.5–5.1)
Sodium: 139 mmol/L (ref 135–145)
Total Bilirubin: 0.6 mg/dL (ref 0.3–1.2)
Total Protein: 6.5 g/dL (ref 6.5–8.1)

## 2017-08-21 MED ORDER — SODIUM CHLORIDE 0.9 % IV SOLN
Freq: Once | INTRAVENOUS | Status: AC
  Administered 2017-08-21: 10:00:00 via INTRAVENOUS
  Filled 2017-08-21: qty 1000

## 2017-08-21 MED ORDER — TBO-FILGRASTIM 480 MCG/0.8ML ~~LOC~~ SOSY
480.0000 ug | PREFILLED_SYRINGE | Freq: Once | SUBCUTANEOUS | Status: AC
  Administered 2017-08-21: 480 ug via SUBCUTANEOUS

## 2017-08-21 MED ORDER — FILGRASTIM 480 MCG/0.8ML IJ SOSY: 480.0000 ug | PREFILLED_SYRINGE | Freq: Once | INTRAMUSCULAR | Status: DC

## 2017-08-21 MED ORDER — SODIUM CHLORIDE 0.9% FLUSH
10.0000 mL | Freq: Once | INTRAVENOUS | Status: AC
  Administered 2017-08-21: 10 mL via INTRAVENOUS
  Filled 2017-08-21: qty 10

## 2017-08-21 MED ORDER — CHLORHEXIDINE GLUCONATE 0.12 % MT SOLN: 15.0000 mL | Freq: Two times a day (BID) | OROMUCOSAL | 0 refills | Status: DC

## 2017-08-21 MED ORDER — HEPARIN SOD (PORK) LOCK FLUSH 100 UNIT/ML IV SOLN
500.0000 [IU] | Freq: Once | INTRAVENOUS | Status: AC
  Administered 2017-08-21: 500 [IU] via INTRAVENOUS
  Filled 2017-08-21: qty 5

## 2017-08-21 NOTE — Progress Notes (Signed)
Patient here today for follow up. Pt c/o decrease in urine output.

## 2017-08-21 NOTE — Progress Notes (Signed)
Nutrition Follow-up:  Patient with colon cancer, followed by Dr. Tasia Catchings.   Patient seen during infusion today, getting fluids, chemotherapy held today due to low WBC.  Patient reports that she has tried oral nutrition supplements and is able to tolerate them.  Reports that she is not drinking daily.  Reports that she has really been trying to increase her protein.  Taste is a chronic issue for her due to sinusitis.  Reports that she taste based on memory.    Medications: reviewed  Labs: reviewed  Anthropometrics:   Weight has decreased to 221 lb today from 225 lb on 9/24   NUTRITION DIAGNOSIS: Inadequate oral intake continues   MALNUTRITION DIAGNOSIS: continue to monitor   INTERVENTION:   Reviewed foods high in protein with patient.  She is planning on trying egg salad, adding cheese to BLT sandwich and adding cottage cheese. Encouraged oral nutrition supplement daily    MONITORING, EVALUATION, GOAL: weight trends, intake   NEXT VISIT: as needed  Edris Schneck B. Zenia Resides, [place], [place] Registered Dietitian [phone] (pager)

## 2017-08-21 NOTE — Progress Notes (Addendum)
Hematology/Oncology Follow Up visit. [place] Telephone:(336) [phone] Fax:(336) [phone]  CONSULT NOTE Patient Care Team: Burnard Hawthorne, FNP as PCP - General (Family Medicine) Vladimir Crofts, MD (Neurology) Noreene Filbert, MD as Referring Physician (Radiation Oncology)  PURPOSE OF CONSULTATION:  Follow up for treatment of Colon cancer   HISTORY OF PRESENTING ILLNESS:  [name] 76 y.o.  female with PMH listed as below was referred to me for evaluation and management of colon cancer.  She had colonoscopy evaluation due to positive cologuard.  She was found to have a nonobstructing mass approximately 20 cm from the anal verge which biopsy proved to be adenocarcinoma. CT scan showed a large mass from the sigmoid and extends beyond the wall of the colon toward the left into the surrounding fat. Preoperational CEA was normal. Patient underwent robotic sigmoidectomy on 06/07/2017.   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 MLH1  loss of function, PMS2 loss of function. Declined option of clinical trial  She has a history of endometrial cancer s/p hysterotomy and radiation. She has a family history of colon cancer and breast cancer.   INTERVAL HISTORY 76 yo female with above history presents to evaluation prior to cycle 3  Adjuvant chemotherapy with FOLFOX. Chemotherapy induced diarrhea: stable, controlled with antidiarrhea.    Weight is stable. Her lost of taste is chronic problem for about 2 years due to chronic sinusitis.   Patient denies SOB, chest pain, abdominal pain, fever or chills.     Review of Systems - Oncology Constitutional: Negative for fever, night sweats,unintentional weight loss, change in appetite. HENT: Negative for ear pain, hearing loss, nasal bleeding Eyes: Negative for eye pain, double vision   Respiratory: Negative for  wheezing, shortness of breath, cough Cardiovascular: Negative for chest pain, palpitation.   Gastrointestinal: Negative abdominal pain, diarrhea, nausea vomiting Endocrine: Negative  Genitourinary: Negative for dysuria, hematuria, frequency Skin: Negative for rash, iching, bruising Neurological: Negative for headache, dizziness, seizure Hematological: Negative for easy bruising/bleeding, lymph node enlargement Psychiatric/Behavioral: Negative for depression, anxiety, suicidality  MEDICAL HISTORY:  Past Medical History:  Diagnosis Date  . Arthritis    bitateral knees and left foot,s/p cortisone injection in past  . Chronic constipation   . Complication of anesthesia     slow to wake up   . Endometrial cancer ([place]) 2014   Total Hysterectomy and Rad tx's.   Marland Kitchen GERD (gastroesophageal reflux disease)   . Hypertension   . IBS (irritable bowel syndrome)   . Menopause    age 55  . PONV (postoperative nausea and vomiting)   . Vitamin D deficiency 06/07/10    SURGICAL HISTORY: Past Surgical History:  Procedure Laterality Date  . BUNIONECTOMY    . COLONOSCOPY WITH PROPOFOL N/A 04/04/2017   Procedure: COLONOSCOPY WITH PROPOFOL;  Surgeon: [name], MD;  Location: [facility] ENDOSCOPY;  Service: Endoscopy;  Laterality: N/A;  . DILATION AND CURETTAGE OF UTERUS  1971  . FOOT SURGERY    . NASAL SINUS SURGERY    . PORTACATH PLACEMENT Right 07/12/2017   Procedure: INSERTION PORT-A-CATH;  Surgeon: Jules Husbands, MD;  Location: [facility] ORS;  Service: General;  Laterality: Right;  . ROBOTIC ASSISTED LAPAROSCOPIC VENTRAL/INCISIONAL HERNIA REPAIR N/A 06/07/2017   Procedure: ROBOTIC INCISIONAL HERNIA REPAIR;  Surgeon: Leighton Ruff, MD;  Location: [facility] ORS;  Service: General;  Laterality: N/A;  . VAGINAL DELIVERY    .  VAGINAL HYSTERECTOMY      SOCIAL HISTORY: Social History   Social History  . Marital status: Divorced    Spouse name: N/A  . Number of children: N/A  . Years of education: N/A    Occupational History  . Not on file.   Social History Main Topics  . Smoking status: Never Smoker  . Smokeless tobacco: Never Used  . Alcohol use Yes     Comment: socially/rarely  . Drug use: No  . Sexual activity: No   Other Topics Concern  . Not on file   Social History Narrative   Exercise- aerobic exercise at home 3-4x week.       Lives in [place]. No pets.    FAMILY HISTORY: Family History  Problem Relation Age of Onset  . Heart disease Father 63       massive MI  . Diabetes Father   . Cancer Sister        breast  . Bipolar disorder Sister   . Osteoporosis Sister   . Breast cancer Sister 59  . Bipolar disorder Maternal Aunt   . Breast cancer Maternal Aunt   . Cancer Maternal Grandfather        colon cancer  . Colon cancer Maternal Grandfather 72  . Osteoporosis Mother   . Colon cancer Mother     ALLERGIES:  is allergic to codeine.  MEDICATIONS:  Current Outpatient Prescriptions  Medication Sig Dispense Refill  . fluticasone (FLONASE) 50 MCG/ACT nasal spray Place 2 sprays into both nostrils daily. (Patient taking differently: Place 2 sprays into both nostrils daily as needed for allergies. ) 16 g 2  . lidocaine-prilocaine (EMLA) cream Apply 1 application topically as needed. 30 g 0  . lidocaine-prilocaine (EMLA) cream Apply to affected area once 30 g 3  . losartan (COZAAR) 50 MG tablet Take 1 tablet (50 mg total) by mouth daily. 90 tablet 3  . Magnesium 400 MG TABS Take 400 mg by mouth daily as needed (irritable bowel syndrome).    . metoprolol succinate (TOPROL-XL) 100 MG 24 hr tablet Take 1 tablet (100 mg total) by mouth daily. 90 tablet 3  . Multiple Vitamin (MULTIVITAMIN WITH MINERALS) TABS tablet Take 1 tablet by mouth daily.    . Multiple Vitamins-Minerals (PRESERVISION AREDS 2) CAPS Take 1 capsule by mouth 2 (two) times daily.    . ondansetron (ZOFRAN) 8 MG tablet Take 1 tablet (8 mg total) by mouth 2 (two) times daily as needed for refractory  nausea / vomiting. Start on day 3 after chemotherapy. 30 tablet 1  . OVER THE COUNTER MEDICATION Take 1 capsule by mouth daily after breakfast. IBgard    . Polyethylene Glycol 3350 (MIRALAX PO) Take 17 g by mouth daily as needed (constipation).     . prochlorperazine (COMPAZINE) 10 MG tablet Take 1 tablet (10 mg total) by mouth every 6 (six) hours as needed (Nausea or vomiting). 30 tablet 1  . simvastatin (ZOCOR) 20 MG tablet Take 1 tablet (20 mg total) by mouth every evening. 90 tablet 4  . topiramate (TOPAMAX) 50 MG tablet Take 1 tablet (50 mg total) by mouth 2 (two) times daily. 180 tablet 2   No current facility-administered medications for this visit.    Facility-Administered Medications Ordered in Other Visits  Medication Dose Route Frequency Provider Last Rate Last Dose  . heparin lock flush 100 unit/mL  500 Units Intravenous Once Earlie Server, MD          .  PHYSICAL EXAMINATION: ECOG PERFORMANCE STATUS: 0 - Asymptomatic Vitals:   08/21/17 0851  BP: 113/63  Pulse: 62  Temp: 98 F (36.7 C)   There were no vitals filed for this visit.  GENERAL: No distress, well nourished.  SKIN:  No rashes or significant lesions  HEAD: Normocephalic, No masses, lesions, tenderness or abnormalities  EYES: Conjunctiva are pink, non icteric ENT: External ears normal ,lips , buccal mucosa, and tongue normal and mucous membranes are moist  LYMPH: No palpable cervical and axillary lymphadenopathy  LUNGS: Clear to auscultation, no crackles or wheezes HEART: Regular rate & rhythm, no murmurs, no gallops, S1 normal and S2 normal  ABDOMEN: Abdomen soft, non-tender, normal bowel sounds, I did not appreciate any  masses or organomegaly  MUSCULOSKELETAL: No CVA tenderness and no tenderness on percussion of the back or rib cage.  EXTREMITIES: No edema, no skin discoloration or tenderness NEURO: Alert & oriented, no focal motor/sensory deficits.  LABORATORY DATA:  I have reviewed the data as listed Lab  Results  Component Value Date   WBC 3.8 08/07/2017   HGB 11.4 (L) 08/07/2017   HCT 33.4 (L) 08/07/2017   MCV 85.6 08/07/2017   PLT 166 08/07/2017    Recent Labs  04/05/17 1612  06/10/17 0502 07/24/17 0833 08/07/17 0818  NA 140  < > 142 139 139  K 3.6  < > 3.8 3.9 3.7  CL 113*  < > 111 111 110  CO2 20*  < > '22 22 24  ' GLUCOSE 91  < > 107* 125* 157*  BUN 11  < > '8 16 16  ' CREATININE 0.88  < > 0.78 0.91 0.87  CALCIUM 9.2  < > 8.8* 9.1 9.1  GFRNONAA >60  < > >60 60* >60  GFRAA >60  < > >60 >60 >60  PROT 7.1  --   --  6.8 6.5  ALBUMIN 3.8  --   --  3.7 3.5  AST 30  --   --  27 27  ALT 16  --   --  17 19  ALKPHOS 100  --   --  102 103  BILITOT 0.7  --   --  0.6 0.6  < > = values in this interval not displayed.  RADIOGRAPHIC STUDIES: I have personally reviewed the radiological images as listed and agreed with the findings in the report. 04/12/2017 CT abdomen pelvis w contrast Large mass arising from the sigmoid colon. This mass extends beyond the wall of the colon toward the left into the surrounding fat. This lesion does not reach the left pelvic sidewall.No adenopathy or liver lesions evident. No omental lesions appreciable. Sizable midline ventral hernia slightly superior to theumbilicus containing fat and a loop of colon without bowel compromise. Much smaller midline umbilical ventral hernia containingonly fat. There is a small hiatal hernia. No bowel obstruction.  No abscess.  Appendix region appears normal. No renal or ureteral calculus.  No hydronephrosis.There is aortoiliac atherosclerosis. There are foci of coronary artery calcification.   ASSESSMENT & PLAN:  76 y.o.female who has Stage IIIB colon cancer.  Cancer Staging Colon cancer [facility]) Staging form: Colon and Rectum, AJCC 8th Edition - Clinical stage from 07/06/2017: Stage IIIB (cT4a, cN1c, cM0) - Unsigned  1. Chemotherapy induced neutropenia (HCC)   2. Malignant neoplasm of sigmoid colon ([place])   3. 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 Neupogen 433mg x1.  Offered her to check if neutropenia resolve, she can have cycle 3 treatment on 08/30/2017. Patient prefers to have all her treatments done on Monday, also need non invasive dental work done. Will delay Cycle 3 to 08/28/2017.   Peridex solution swish and spit.   2 Sigmoid colon cancer: Cycle 3 adjuvant chemotherapy 08/28/2017, consider omit oxaliplatin.  CBC CMP prior to chemo  3  T4 tumor, will refer to RadOnc to discuss RT after chemotherapy. She had radiation previously due to endometrial cancer. Defer Dr.Chrystal to decide if she can still receive additional RT.  4 Patient asked if her dentist can out her crown onto her implanted post. I advise patient to avoid invasive dental work. Currently she is neutropenic, getting Neupogen x 1 dose today. Repeat CBC on 08/23/2017. If ANC>1.5, she can go to dentist on 08/24/2017 to put on dental crown. 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 She did not tolerate Neurontin in the past due to memory loss.   All questions were answered. The patient knows to call the clinic with any problems questions or concerns.  Return of visit: 1 weeks for delayed  cycle 3 chemotherapy w cbc, cmp  ZEarlie Server MD, PhD Hematology Oncology [facility] Center[facility]ager- [phone]/06/2017

## 2017-08-23 ENCOUNTER — Inpatient Hospital Stay: Payer: Medicare HMO

## 2017-08-23 DIAGNOSIS — I10 Essential (primary) hypertension: Secondary | ICD-10-CM | POA: Diagnosis not present

## 2017-08-23 DIAGNOSIS — R739 Hyperglycemia, unspecified: Secondary | ICD-10-CM | POA: Diagnosis not present

## 2017-08-23 DIAGNOSIS — D492 Neoplasm of unspecified behavior of bone, soft tissue, and skin: Secondary | ICD-10-CM | POA: Diagnosis not present

## 2017-08-23 DIAGNOSIS — T451X5S Adverse effect of antineoplastic and immunosuppressive drugs, sequela: Secondary | ICD-10-CM | POA: Diagnosis not present

## 2017-08-23 DIAGNOSIS — C189 Malignant neoplasm of colon, unspecified: Secondary | ICD-10-CM | POA: Diagnosis not present

## 2017-08-23 DIAGNOSIS — Z5111 Encounter for antineoplastic chemotherapy: Secondary | ICD-10-CM | POA: Diagnosis not present

## 2017-08-23 DIAGNOSIS — C187 Malignant neoplasm of sigmoid colon: Secondary | ICD-10-CM

## 2017-08-23 DIAGNOSIS — C541 Malignant neoplasm of endometrium: Secondary | ICD-10-CM | POA: Diagnosis not present

## 2017-08-23 DIAGNOSIS — E559 Vitamin D deficiency, unspecified: Secondary | ICD-10-CM | POA: Diagnosis not present

## 2017-08-23 DIAGNOSIS — D701 Agranulocytosis secondary to cancer chemotherapy: Secondary | ICD-10-CM | POA: Diagnosis not present

## 2017-08-23 LAB — COMPREHENSIVE METABOLIC PANEL
ALT: 22 U/L (ref 14–54)
ANION GAP: 9 (ref 5–15)
AST: 33 U/L (ref 15–41)
Albumin: 3.6 g/dL (ref 3.5–5.0)
Alkaline Phosphatase: 138 U/L — ABNORMAL HIGH (ref 38–126)
BILIRUBIN TOTAL: 0.8 mg/dL (ref 0.3–1.2)
BUN: 9 mg/dL (ref 6–20)
CHLORIDE: 108 mmol/L (ref 101–111)
CO2: 21 mmol/L — ABNORMAL LOW (ref 22–32)
Calcium: 9.2 mg/dL (ref 8.9–10.3)
Creatinine, Ser: 0.94 mg/dL (ref 0.44–1.00)
GFR, EST NON AFRICAN AMERICAN: 57 mL/min — AB (ref >60)
Glucose, Bld: 197 mg/dL — ABNORMAL HIGH (ref 65–99)
POTASSIUM: 3.5 mmol/L (ref 3.5–5.1)
Sodium: 138 mmol/L (ref 135–145)
TOTAL PROTEIN: 6.7 g/dL (ref 6.5–8.1)

## 2017-08-23 LAB — CBC WITH DIFFERENTIAL/PLATELET
BASOS ABS: 0.1 10*3/uL (ref 0–0.1)
Basophils Relative: 1 %
EOS PCT: 3 %
Eosinophils Absolute: 0.2 10*3/uL (ref 0–0.7)
HEMATOCRIT: 36.5 % (ref 35.0–47.0)
Hemoglobin: 12.1 g/dL (ref 12.0–16.0)
LYMPHS PCT: 21 %
Lymphs Abs: 1.5 10*3/uL (ref 1.0–3.6)
MCH: 29.1 pg (ref 26.0–34.0)
MCHC: 33.2 g/dL (ref 32.0–36.0)
MCV: 87.5 fL (ref 80.0–100.0)
MONO ABS: 0.6 10*3/uL (ref 0.2–0.9)
MONOS PCT: 9 %
Neutro Abs: 4.7 10*3/uL (ref 1.4–6.5)
Neutrophils Relative %: 66 %
PLATELETS: 166 10*3/uL (ref 150–440)
RBC: 4.17 MIL/uL (ref 3.80–5.20)
RDW: 16.8 % — AB (ref 11.5–14.5)
WBC: 7.1 10*3/uL (ref 3.6–11.0)

## 2017-08-28 ENCOUNTER — Inpatient Hospital Stay (HOSPITAL_BASED_OUTPATIENT_CLINIC_OR_DEPARTMENT_OTHER): Payer: Medicare HMO | Admitting: Oncology

## 2017-08-28 ENCOUNTER — Inpatient Hospital Stay: Payer: Medicare HMO

## 2017-08-28 ENCOUNTER — Encounter: Payer: Self-pay | Admitting: Oncology

## 2017-08-28 VITALS — BP 134/75 | HR 61 | Temp 97.9°F | Wt 221.0 lb

## 2017-08-28 DIAGNOSIS — D701 Agranulocytosis secondary to cancer chemotherapy: Secondary | ICD-10-CM

## 2017-08-28 DIAGNOSIS — Z5111 Encounter for antineoplastic chemotherapy: Secondary | ICD-10-CM

## 2017-08-28 DIAGNOSIS — Z79899 Other long term (current) drug therapy: Secondary | ICD-10-CM

## 2017-08-28 DIAGNOSIS — T451X5A Adverse effect of antineoplastic and immunosuppressive drugs, initial encounter: Secondary | ICD-10-CM

## 2017-08-28 DIAGNOSIS — C187 Malignant neoplasm of sigmoid colon: Secondary | ICD-10-CM

## 2017-08-28 DIAGNOSIS — C541 Malignant neoplasm of endometrium: Secondary | ICD-10-CM | POA: Diagnosis not present

## 2017-08-28 DIAGNOSIS — D492 Neoplasm of unspecified behavior of bone, soft tissue, and skin: Secondary | ICD-10-CM | POA: Diagnosis not present

## 2017-08-28 DIAGNOSIS — Z800 Family history of malignant neoplasm of digestive organs: Secondary | ICD-10-CM

## 2017-08-28 DIAGNOSIS — G620 Drug-induced polyneuropathy: Secondary | ICD-10-CM

## 2017-08-28 DIAGNOSIS — Z8542 Personal history of malignant neoplasm of other parts of uterus: Secondary | ICD-10-CM

## 2017-08-28 DIAGNOSIS — C189 Malignant neoplasm of colon, unspecified: Secondary | ICD-10-CM | POA: Diagnosis not present

## 2017-08-28 DIAGNOSIS — Z803 Family history of malignant neoplasm of breast: Secondary | ICD-10-CM

## 2017-08-28 DIAGNOSIS — R739 Hyperglycemia, unspecified: Secondary | ICD-10-CM

## 2017-08-28 DIAGNOSIS — T451X5S Adverse effect of antineoplastic and immunosuppressive drugs, sequela: Secondary | ICD-10-CM | POA: Diagnosis not present

## 2017-08-28 DIAGNOSIS — I10 Essential (primary) hypertension: Secondary | ICD-10-CM | POA: Diagnosis not present

## 2017-08-28 DIAGNOSIS — E559 Vitamin D deficiency, unspecified: Secondary | ICD-10-CM | POA: Diagnosis not present

## 2017-08-28 LAB — CBC WITH DIFFERENTIAL/PLATELET
Basophils Absolute: 0.1 10*3/uL (ref 0–0.1)
Basophils Relative: 1 %
Eosinophils Absolute: 0.1 10*3/uL (ref 0–0.7)
Eosinophils Relative: 1 %
HEMATOCRIT: 35.9 % (ref 35.0–47.0)
Hemoglobin: 11.9 g/dL — ABNORMAL LOW (ref 12.0–16.0)
LYMPHS ABS: 1.7 10*3/uL (ref 1.0–3.6)
Lymphocytes Relative: 27 %
MCH: 29.1 pg (ref 26.0–34.0)
MCHC: 33.2 g/dL (ref 32.0–36.0)
MCV: 87.6 fL (ref 80.0–100.0)
MONOS PCT: 13 %
Monocytes Absolute: 0.8 10*3/uL (ref 0.2–0.9)
Neutro Abs: 3.6 10*3/uL (ref 1.4–6.5)
Neutrophils Relative %: 58 %
Platelets: 205 10*3/uL (ref 150–440)
RBC: 4.1 MIL/uL (ref 3.80–5.20)
RDW: 16.4 % — AB (ref 11.5–14.5)
WBC: 6.3 10*3/uL (ref 3.6–11.0)

## 2017-08-28 LAB — COMPREHENSIVE METABOLIC PANEL
ALT: 22 U/L (ref 14–54)
ANION GAP: 9 (ref 5–15)
AST: 29 U/L (ref 15–41)
Albumin: 3.5 g/dL (ref 3.5–5.0)
Alkaline Phosphatase: 132 U/L — ABNORMAL HIGH (ref 38–126)
BILIRUBIN TOTAL: 0.5 mg/dL (ref 0.3–1.2)
BUN: 13 mg/dL (ref 6–20)
CO2: 23 mmol/L (ref 22–32)
Calcium: 9.1 mg/dL (ref 8.9–10.3)
Chloride: 109 mmol/L (ref 101–111)
Creatinine, Ser: 0.78 mg/dL (ref 0.44–1.00)
GFR calc Af Amer: >60 mL/min (ref >60)
Glucose, Bld: 145 mg/dL — ABNORMAL HIGH (ref 65–99)
POTASSIUM: 3.6 mmol/L (ref 3.5–5.1)
Sodium: 141 mmol/L (ref 135–145)
TOTAL PROTEIN: 6.6 g/dL (ref 6.5–8.1)

## 2017-08-28 LAB — HEMOGLOBIN A1C
HEMOGLOBIN A1C: 5.9 % — AB (ref 4.8–5.6)
Mean Plasma Glucose: 122.63 mg/dL

## 2017-08-28 MED ORDER — FLUOROURACIL CHEMO INJECTION 2.5 GM/50ML
400.0000 mg/m2 | Freq: Once | INTRAVENOUS | Status: AC
  Administered 2017-08-28: 850 mg via INTRAVENOUS
  Filled 2017-08-28: qty 17

## 2017-08-28 MED ORDER — LEUCOVORIN CALCIUM INJECTION 350 MG: 400.0000 mg/m2 | Freq: Once | INTRAVENOUS | Status: DC

## 2017-08-28 MED ORDER — SODIUM CHLORIDE 0.9 % IV SOLN
2400.0000 mg/m2 | INTRAVENOUS | Status: DC
  Administered 2017-08-28: 5200 mg via INTRAVENOUS
  Filled 2017-08-28: qty 104

## 2017-08-28 MED ORDER — DEXTROSE 5 % IV SOLN
Freq: Once | INTRAVENOUS | Status: DC
Start: 2017-08-28 — End: 2017-08-28
  Filled 2017-08-28: qty 1000

## 2017-08-28 MED ORDER — HEPARIN SOD (PORK) LOCK FLUSH 100 UNIT/ML IV SOLN: 500.0000 [IU] | Freq: Once | INTRAVENOUS | Status: DC

## 2017-08-28 MED ORDER — PALONOSETRON HCL INJECTION 0.25 MG/5ML
0.2500 mg | Freq: Once | INTRAVENOUS | Status: AC
  Administered 2017-08-28: 0.25 mg via INTRAVENOUS
  Filled 2017-08-28: qty 5

## 2017-08-28 MED ORDER — SODIUM CHLORIDE 0.9 % IV SOLN
INTRAVENOUS | Status: DC
  Administered 2017-08-28: 10:00:00 via INTRAVENOUS
  Filled 2017-08-28: qty 1000

## 2017-08-28 MED ORDER — LEUCOVORIN CALCIUM INJECTION 100 MG
20.0000 mg/m2 | Freq: Once | INTRAMUSCULAR | Status: AC
  Administered 2017-08-28: 42 mg via INTRAVENOUS
  Filled 2017-08-28: qty 2.1

## 2017-08-28 MED ORDER — SODIUM CHLORIDE 0.9% FLUSH
10.0000 mL | INTRAVENOUS | Status: DC | PRN
  Administered 2017-08-28: 10 mL via INTRAVENOUS
  Filled 2017-08-28: qty 10

## 2017-08-28 NOTE — Progress Notes (Signed)
Patient here today for follow up and chemotherapy.  Patient c/o constipation

## 2017-08-28 NOTE — Progress Notes (Signed)
Hematology/Oncology Follow Up visit. [place] Telephone:(336) [phone] Fax:(336) [phone]  CONSULT NOTE Patient Care Team: Burnard Hawthorne, FNP as PCP - General (Family Medicine) Vladimir Crofts, MD (Neurology) Noreene Filbert, MD as Referring Physician (Radiation Oncology)  PURPOSE OF CONSULTATION:  Follow up for treatment of Colon cancer   HISTORY OF PRESENTING ILLNESS:  [name] 76 y.o.  female with PMH listed as below was referred to me for evaluation and management of colon cancer.  She had colonoscopy evaluation due to positive cologuard.  She was found to have a nonobstructing mass approximately 20 cm from the anal verge which biopsy proved to be adenocarcinoma. CT scan showed a large mass from the sigmoid and extends beyond the wall of the colon toward the left into the surrounding fat. Preoperational CEA was normal. Patient underwent robotic sigmoidectomy on 06/07/2017.   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 MLH1  loss of function, PMS2 loss of function. Declined option of clinical trial ATOMIC She has a history of endometrial cancer s/p hysterotomy and radiation. She has a family history of colon cancer and breast cancer.   INTERVAL HISTORY 76 yo female with above history presents to evaluation prior to cycle 3  Adjuvant chemotherapy. This was delayed for a week  due to Grade 3 Neutropenia. Got 1 dose of Neupogen on 08/21/2017, counts improved. She has had dental crown placed during the interval.  Chemotherapy induced diarrhea: She reports having constipation, and uses stool softer and Miralax PRN.   Weight is stable. Her lost of taste is chronic problem for about 2 years due to chronic sinusitis and this is stable.  She noticed that her glucose levels have been high, ranging from 160s -190s. No history of DM.  She noticed dry mouth which is  new to her, but contributes to her sleeping with mouth open due to sinusitis.  Chemotherapy induced neuropathy: resolved.   Patient denies SOB, chest pain, abdominal pain, fever or chills.     Review of Systems - Oncology Constitutional: Negative for fever, night sweats,unintentional weight loss, change in appetite. HENT: Negative for ear pain, hearing loss, nasal bleeding (+)Dry mouth Eyes: Negative for eye pain, double vision   Respiratory: Negative for wheezing, shortness of breath, cough Cardiovascular: Negative for chest pain, palpitation.   Gastrointestinal: Negative abdominal pain, diarrhea, nausea vomiting. (+) constipation Endocrine: Negative  Genitourinary: Negative for dysuria, hematuria, frequency Skin: Negative for rash, iching, bruising Neurological: Negative for headache, dizziness, seizure Hematological: Negative for easy bruising/bleeding, lymph node enlargement Psychiatric/Behavioral: Negative for depression, anxiety, suicidality  MEDICAL HISTORY:  Past Medical History:  Diagnosis Date  . Arthritis    bitateral knees and left foot,s/p cortisone injection in past  . Chronic constipation   . Complication of anesthesia     slow to wake up   . Endometrial cancer ([place]) 2014   Total Hysterectomy and Rad tx's.   Marland Kitchen GERD (gastroesophageal reflux disease)   . Hypertension   . IBS (irritable bowel syndrome)   . Menopause    age 42  . PONV (postoperative nausea and vomiting)   . Vitamin D deficiency 06/07/10    SURGICAL HISTORY: Past Surgical History:  Procedure Laterality Date  . BUNIONECTOMY    . COLONOSCOPY WITH PROPOFOL N/A 04/04/2017   Procedure: COLONOSCOPY WITH PROPOFOL;  Surgeon: [name], MD;  Location: [facility] ENDOSCOPY;  Service: Endoscopy;  Laterality: N/A;  .  DILATION AND CURETTAGE OF UTERUS  1971  . FOOT SURGERY    . NASAL SINUS SURGERY    . PORTACATH PLACEMENT Right 07/12/2017   Procedure: INSERTION PORT-A-CATH;  Surgeon: Jules Husbands, MD;   Location: [facility] ORS;  Service: General;  Laterality: Right;  . ROBOTIC ASSISTED LAPAROSCOPIC VENTRAL/INCISIONAL HERNIA REPAIR N/A 06/07/2017   Procedure: ROBOTIC INCISIONAL HERNIA REPAIR;  Surgeon: Leighton Ruff, MD;  Location: [facility] ORS;  Service: General;  Laterality: N/A;  . VAGINAL DELIVERY    . VAGINAL HYSTERECTOMY      SOCIAL HISTORY: Social History   Social History  . Marital status: Divorced    Spouse name: N/A  . Number of children: N/A  . Years of education: N/A   Occupational History  . Not on file.   Social History Main Topics  . Smoking status: Never Smoker  . Smokeless tobacco: Never Used  . Alcohol use Yes     Comment: socially/rarely  . Drug use: No  . Sexual activity: No   Other Topics Concern  . Not on file   Social History Narrative   Exercise- aerobic exercise at home 3-4x week.       Lives in [place]. No pets.    FAMILY HISTORY: Family History  Problem Relation Age of Onset  . Heart disease Father 31       massive MI  . Diabetes Father   . Cancer Sister        breast  . Bipolar disorder Sister   . Osteoporosis Sister   . Breast cancer Sister 27  . Bipolar disorder Maternal Aunt   . Breast cancer Maternal Aunt   . Cancer Maternal Grandfather        colon cancer  . Colon cancer Maternal Grandfather 29  . Osteoporosis Mother   . Colon cancer Mother     ALLERGIES:  is allergic to codeine.  MEDICATIONS:  Current Outpatient Prescriptions  Medication Sig Dispense Refill  . chlorhexidine (PERIDEX) 0.12 % solution Use as directed 15 mLs in the mouth or throat 2 (two) times daily. 473 mL 0  . fluticasone (FLONASE) 50 MCG/ACT nasal spray Place 2 sprays into both nostrils daily. (Patient taking differently: Place 2 sprays into both nostrils daily as needed for allergies. ) 16 g 2  . lidocaine-prilocaine (EMLA) cream Apply 1 application topically as needed. 30 g 0  . lidocaine-prilocaine (EMLA) cream Apply to affected area once 30 g 3  .  losartan (COZAAR) 50 MG tablet Take 1 tablet (50 mg total) by mouth daily. 90 tablet 3  . Magnesium 400 MG TABS Take 400 mg by mouth daily as needed (irritable bowel syndrome).    . metoprolol succinate (TOPROL-XL) 100 MG 24 hr tablet Take 1 tablet (100 mg total) by mouth daily. 90 tablet 3  . Multiple Vitamin (MULTIVITAMIN WITH MINERALS) TABS tablet Take 1 tablet by mouth daily.    . Multiple Vitamins-Minerals (PRESERVISION AREDS 2) CAPS Take 1 capsule by mouth 2 (two) times daily.    . ondansetron (ZOFRAN) 8 MG tablet Take 1 tablet (8 mg total) by mouth 2 (two) times daily as needed for refractory nausea / vomiting. Start on day 3 after chemotherapy. 30 tablet 1  . OVER THE COUNTER MEDICATION Take 1 capsule by mouth daily after breakfast. IBgard    . Polyethylene Glycol 3350 (MIRALAX PO) Take 17 g by mouth daily as needed (constipation).     . prochlorperazine (COMPAZINE) 10 MG tablet Take 1 tablet (10 mg  total) by mouth every 6 (six) hours as needed (Nausea or vomiting). (Patient not taking: Reported on 08/21/2017) 30 tablet 1  . simvastatin (ZOCOR) 20 MG tablet Take 1 tablet (20 mg total) by mouth every evening. 90 tablet 4  . topiramate (TOPAMAX) 50 MG tablet Take 1 tablet (50 mg total) by mouth 2 (two) times daily. 180 tablet 2   No current facility-administered medications for this visit.    Facility-Administered Medications Ordered in Other Visits  Medication Dose Route Frequency Provider Last Rate Last Dose  . heparin lock flush 100 unit/mL  500 Units Intravenous Once Earlie Server, MD      . sodium chloride flush (NS) 0.9 % injection 10 mL  10 mL Intravenous PRN Earlie Server, MD   10 mL at 08/28/17 0086      .  PHYSICAL EXAMINATION: ECOG PERFORMANCE STATUS: 0 - Asymptomatic Vitals:   08/28/17 0853  BP: 134/75  Pulse: 61  Temp: 97.9 F (36.6 C)   Filed Weights   08/28/17 0853  Weight: 221 lb (100.2 kg)    GENERAL: No distress, well nourished.  SKIN:  No rashes or significant  lesions  HEAD: Normocephalic, No masses, lesions, tenderness or abnormalities  EYES: Conjunctiva are pink, non icteric ENT: External ears normal ,lips , buccal mucosa, and tongue normal and mucous membranes are moist. No thrush LYMPH: No palpable cervical and axillary lymphadenopathy  LUNGS: Clear to auscultation, no crackles or wheezes HEART: Regular rate & rhythm, no murmurs, no gallops, S1 normal and S2 normal  ABDOMEN: Abdomen soft, non-tender, normal bowel sounds,   MUSCULOSKELETAL: No CVA tenderness and no tenderness on percussion of the back or rib cage.  EXTREMITIES: No edema, no skin discoloration or tenderness NEURO: Alert & oriented, no focal motor/sensory deficits.  LABORATORY DATA:  I have reviewed the data as listed Lab Results  Component Value Date   WBC 6.3 08/28/2017   HGB 11.9 (L) 08/28/2017   HCT 35.9 08/28/2017   MCV 87.6 08/28/2017   PLT 205 08/28/2017    Recent Labs  08/07/17 0818 08/21/17 0812 08/23/17 0904  NA 139 139 138  K 3.7 3.5 3.5  CL 110 108 108  CO2 24 25 21*  GLUCOSE 157* 168* 197*  BUN '16 14 9  ' CREATININE 0.87 0.93 0.94  CALCIUM 9.1 9.1 9.2  GFRNONAA >60 58* 57*  GFRAA >60 >60 >60  PROT 6.5 6.5 6.7  ALBUMIN 3.5 3.5 3.6  AST 27 31 33  ALT '19 20 22  ' ALKPHOS 103 121 138*  BILITOT 0.6 0.6 0.8    RADIOGRAPHIC STUDIES: I have personally reviewed the radiological images as listed and agreed with the findings in the report. 04/12/2017 CT abdomen pelvis w contrast Large mass arising from the sigmoid colon. This mass extends beyond the wall of the colon toward the left into the surrounding fat. This lesion does not reach the left pelvic sidewall.No adenopathy or liver lesions evident. No omental lesions appreciable. Sizable midline ventral hernia slightly superior to theumbilicus containing fat and a loop of colon without bowel compromise. Much smaller midline umbilical ventral hernia containingonly fat. There is a small hiatal hernia. No bowel  obstruction.  No abscess.  Appendix region appears normal. No renal or ureteral calculus.  No hydronephrosis.There is aortoiliac atherosclerosis. There are foci of coronary artery calcification.   ASSESSMENT & PLAN:  76 y.o.female who has Stage IIIB colon cancer.  Cancer Staging Colon cancer [facility]) Staging form: Colon and Rectum, AJCC 8th Edition -  Clinical stage from 07/06/2017: Stage IIIB (cT4a, cN1c, cM0) - Unsigned  1. Malignant neoplasm of sigmoid colon ([place])   2. Encounter for antineoplastic chemotherapy   3. Hyperglycemia     # chemotherapy induced neutropenia,resolved today.  Proceed cycle 3 treatment today. Omit Oxaliplatin, just proceed with single agent 5FU treatment from now on.  Discussed with patient and she agrees.   #  T4 tumor, refer to RadOnc to discuss RT after chemotherapy. She had radiation previously due to endometrial cancer. Defer Dr.Chrystal to decide if she can still receive additional RT.   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  Continue Vitamin B6 supplementation. She did not tolerate Neurontin in the past due to memory loss.   # Hyperglycemia: explained to patient that this can be due to the steroids given as premedication. Will check HbA1C #  Significant personal history, somatic MLH1  loss of function, PMS2 loss of function.  family history suspecting germline mutation, ?lynch syndrome. Genetic test panel sent, awaiting for results.   All questions were answered. The patient knows to call the clinic with any problems questions or concerns.  Return of visit:  2 weeks to see  Dr Grayland Ormond who covers me for cycle 4 adjuvant 5 FU treatment, cbc, cmp prior to treatment She will see me in 4 weeks prior to cycle 5 adjuvant 5-FU, cbc, cmp prior to treatment  Earlie Server, MD, PhD Hematology Oncology [facility] Pager- [phone] 08/28/2017

## 2017-08-30 ENCOUNTER — Inpatient Hospital Stay: Payer: Medicare HMO

## 2017-08-30 VITALS — BP 114/76 | HR 71 | Temp 97.1°F | Resp 18

## 2017-08-30 DIAGNOSIS — Z5111 Encounter for antineoplastic chemotherapy: Secondary | ICD-10-CM | POA: Diagnosis not present

## 2017-08-30 DIAGNOSIS — T451X5S Adverse effect of antineoplastic and immunosuppressive drugs, sequela: Secondary | ICD-10-CM | POA: Diagnosis not present

## 2017-08-30 DIAGNOSIS — E559 Vitamin D deficiency, unspecified: Secondary | ICD-10-CM | POA: Diagnosis not present

## 2017-08-30 DIAGNOSIS — R739 Hyperglycemia, unspecified: Secondary | ICD-10-CM | POA: Diagnosis not present

## 2017-08-30 DIAGNOSIS — D492 Neoplasm of unspecified behavior of bone, soft tissue, and skin: Secondary | ICD-10-CM | POA: Diagnosis not present

## 2017-08-30 DIAGNOSIS — D701 Agranulocytosis secondary to cancer chemotherapy: Secondary | ICD-10-CM | POA: Diagnosis not present

## 2017-08-30 DIAGNOSIS — C187 Malignant neoplasm of sigmoid colon: Secondary | ICD-10-CM | POA: Diagnosis not present

## 2017-08-30 DIAGNOSIS — I10 Essential (primary) hypertension: Secondary | ICD-10-CM | POA: Diagnosis not present

## 2017-08-30 DIAGNOSIS — C541 Malignant neoplasm of endometrium: Secondary | ICD-10-CM | POA: Diagnosis not present

## 2017-08-30 MED ORDER — HEPARIN SOD (PORK) LOCK FLUSH 100 UNIT/ML IV SOLN
500.0000 [IU] | Freq: Once | INTRAVENOUS | Status: AC | PRN
  Administered 2017-08-30: 500 [IU]

## 2017-08-30 MED ORDER — SODIUM CHLORIDE 0.9% FLUSH
10.0000 mL | INTRAVENOUS | Status: DC | PRN
  Administered 2017-08-30: 10 mL
  Filled 2017-08-30: qty 10

## 2017-08-30 MED ORDER — HEPARIN SOD (PORK) LOCK FLUSH 100 UNIT/ML IV SOLN
INTRAVENOUS | Status: AC
  Filled 2017-08-30: qty 5

## 2017-09-08 ENCOUNTER — Other Ambulatory Visit: Payer: Self-pay | Admitting: Oncology

## 2017-09-08 NOTE — Progress Notes (Signed)
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  She is tolerating her treatments well without significant side effects.  She no longer planes of diarrhea.  She has no neurologic complaints.  She denies any recent fevers or illnesses.  She has a appetite, but denies weight loss.  She has no chest pain or shortness of breath.  She denies any nausea, vomiting, constipation, or diarrhea.  She has no urinary complaints.  Patient offers no further specific complaints today.  REVIEW OF SYSTEMS:   Review of Systems  Constitutional: Negative.  Negative for fever, malaise/fatigue and weight loss.  Respiratory: Negative.  Negative for cough and shortness of breath.   Cardiovascular: Negative.  Negative for chest pain and leg swelling.  Gastrointestinal: Negative.  Negative for abdominal pain, blood in stool, diarrhea and melena.  Genitourinary: Negative.   Musculoskeletal: Negative.   Skin: Negative.  Negative for rash.  Neurological: Negative for weakness.  Psychiatric/Behavioral: Negative.  The patient is not nervous/anxious.     As per HPI. Otherwise, a complete review of systems is negative.  PAST MEDICAL HISTORY: Past Medical History:  Diagnosis Date  . Arthritis    bitateral knees and left foot,s/p cortisone injection in past  . Chronic constipation   . Complication of anesthesia     slow to wake up   . Endometrial cancer ([place]) 2014   Total Hysterectomy and Rad tx's.   Marland Kitchen GERD  (gastroesophageal reflux disease)   . Hypertension   . IBS (irritable bowel syndrome)   . Menopause    age 29  . PONV (postoperative nausea and vomiting)   . Vitamin D deficiency 06/07/10    PAST SURGICAL HISTORY: Past Surgical History:  Procedure Laterality Date  . BUNIONECTOMY    . COLONOSCOPY WITH PROPOFOL N/A 04/04/2017   Procedure: COLONOSCOPY WITH PROPOFOL;  Surgeon: [name], MD;  Location[facility] ENDOSCOPY;  Service: Endoscopy;  Laterality: N/A;  . DILATION AND CURETTAGE OF UTERUS  1971  . FOOT SURGERY    . NASAL SINUS SURGERY    . PORTACATH PLACEMENT Right 07/12/2017   Procedure: INSERTION PORT-A-CATH;  Surgeon: Jules Husbands, MD;  Location: [facility] ORS;  Service: General;  Laterality: Right;  . ROBOTIC ASSISTED LAPAROSCOPIC VENTRAL/INCISIONAL HERNIA REPAIR N/A 06/07/2017   Procedure: ROBOTIC INCISIONAL HERNIA REPAIR;  Surgeon: Leighton Ruff, MD;  Location: [facility] ORS;  Service: General;  Laterality: N/A;  . VAGINAL DELIVERY    . VAGINAL HYSTERECTOMY      FAMILY HISTORY: Family History  Problem Relation Age of Onset  . Heart disease Father 15       massive MI  . Diabetes Father   . Cancer Sister        breast  . Bipolar disorder Sister   . Osteoporosis Sister   . Breast cancer Sister 48  . Bipolar disorder Maternal Aunt   . Breast cancer Maternal Aunt   . Cancer Maternal Grandfather        colon cancer  . Colon cancer Maternal Grandfather 37  . Osteoporosis Mother   .  Colon cancer Mother     ADVANCED DIRECTIVES (Y/N):  N  HEALTH MAINTENANCE: Social History  Substance Use Topics  . Smoking status: Never Smoker  . Smokeless tobacco: Never Used  . Alcohol use Yes     Comment: socially/rarely     Colonoscopy:  PAP:  Bone density:  Lipid panel:  Allergies  Allergen Reactions  . Codeine Nausea And Vomiting    Current Outpatient Prescriptions  Medication Sig Dispense Refill  . chlorhexidine (PERIDEX) 0.12 % solution Use as directed 15 mLs in the mouth  or throat 2 (two) times daily. 473 mL 0  . fluticasone (FLONASE) 50 MCG/ACT nasal spray Place 2 sprays into both nostrils daily. (Patient taking differently: Place 2 sprays into both nostrils daily as needed for allergies. ) 16 g 2  . lidocaine-prilocaine (EMLA) cream Apply 1 application topically as needed. 30 g 0  . lidocaine-prilocaine (EMLA) cream Apply to affected area once 30 g 3  . losartan (COZAAR) 50 MG tablet Take 1 tablet (50 mg total) by mouth daily. 90 tablet 3  . Magnesium 400 MG TABS Take 400 mg by mouth daily as needed (irritable bowel syndrome).    . metoprolol succinate (TOPROL-XL) 100 MG 24 hr tablet Take 1 tablet (100 mg total) by mouth daily. 90 tablet 3  . Multiple Vitamin (MULTIVITAMIN WITH MINERALS) TABS tablet Take 1 tablet by mouth daily.    . Multiple Vitamins-Minerals (PRESERVISION AREDS 2) CAPS Take 1 capsule by mouth 2 (two) times daily.    . ondansetron (ZOFRAN) 8 MG tablet Take 1 tablet (8 mg total) by mouth 2 (two) times daily as needed for refractory nausea / vomiting. Start on day 3 after chemotherapy. 30 tablet 1  . OVER THE COUNTER MEDICATION Take 1 capsule by mouth daily after breakfast. IBgard    . Polyethylene Glycol 3350 (MIRALAX PO) Take 17 g by mouth daily as needed (constipation).     . prochlorperazine (COMPAZINE) 10 MG tablet Take 1 tablet (10 mg total) by mouth every 6 (six) hours as needed (Nausea or vomiting). 30 tablet 1  . simvastatin (ZOCOR) 20 MG tablet Take 1 tablet (20 mg total) by mouth every evening. 90 tablet 4  . topiramate (TOPAMAX) 50 MG tablet Take 1 tablet (50 mg total) by mouth 2 (two) times daily. 180 tablet 2   No current facility-administered medications for this visit.    Facility-Administered Medications Ordered in Other Visits  Medication Dose Route Frequency Provider Last Rate Last Dose  . dextrose 5 % solution   Intravenous Once Lloyd Huger, MD      . fluorouracil (ADRUCIL) 5,200 mg in sodium chloride 0.9 % 146 mL  chemo infusion  2,400 mg/m2 (Treatment Plan Recorded) Intravenous 1 day or 1 dose Lloyd Huger, MD      . fluorouracil (ADRUCIL) chemo injection 850 mg  400 mg/m2 (Treatment Plan Recorded) Intravenous Once Lloyd Huger, MD      . leucovorin injection 42 mg  42 mg Intravenous Once Lloyd Huger, MD      . palonosetron (ALOXI) injection 0.25 mg  0.25 mg Intravenous Once Lloyd Huger, MD        OBJECTIVE: Vitals:   09/11/17 0851  BP: 135/73  Resp: 18  Temp: (!) 97.4 F (36.3 C)     Body mass index is 36.99 kg/m.    ECOG FS:0 - Asymptomatic  General: Well-developed, well-nourished, no acute distress. Eyes: Pink conjunctiva, anicteric sclera. HEENT: Normocephalic, moist mucous  membranes, clear oropharnyx. Lungs: Clear to auscultation bilaterally. Heart: Regular rate and rhythm. No rubs, murmurs, or gallops. Abdomen: Soft, nontender, nondistended. No organomegaly noted, normoactive bowel sounds. Musculoskeletal: No edema, cyanosis, or clubbing. Neuro: Alert, answering all questions appropriately. Cranial nerves grossly intact. Skin: No rashes or petechiae noted. Psych: Normal affect. Lymphatics: No cervical, calvicular, axillary or inguinal LAD.   LAB RESULTS:  Lab Results  Component Value Date   NA 140 09/11/2017   K 3.8 09/11/2017   CL 110 09/11/2017   CO2 23 09/11/2017   GLUCOSE 156 (H) 09/11/2017   BUN 16 09/11/2017   CREATININE 0.85 09/11/2017   CALCIUM 9.3 09/11/2017   PROT 6.7 09/11/2017   ALBUMIN 3.5 09/11/2017   AST 28 09/11/2017   ALT 21 09/11/2017   ALKPHOS 133 (H) 09/11/2017   BILITOT 0.9 09/11/2017   GFRNONAA >60 09/11/2017   GFRAA >60 09/11/2017    Lab Results  Component Value Date   WBC 4.3 09/11/2017   NEUTROABS 2.3 09/11/2017   HGB 11.5 (L) 09/11/2017   HCT 35.1 09/11/2017   MCV 89.0 09/11/2017   PLT 228 09/11/2017     STUDIES: No results found.  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  MLH1  loss of function, PMS2 loss of function .  PLAN:    # chemotherapy induced neutropenia, resolved.  Proceed cycle 4 treatment today. Omit Oxaliplatin, just proceed with single agent 5FU treatment from now on.  Return to clinic in 2 days for pump removal and then in 2 weeks for consideration of cycle 5.  #  T4 tumor, refer to RadOnc to discuss RT after chemotherapy. She had radiation previously due to endometrial cancer. Defer Dr.Chrystal to decide if she can still receive additional RT. patient has an appointment on September 13, 2017.  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  Continue Vitamin B6 supplementation. She did not tolerate Neurontin in the past due to memory loss.   # Hyperglycemia: explained to patient that this can be due to the steroids given as premedication.   # Significant personal history, somatic MLH1  loss of function, PMS2 loss of function. Family history suspecting germline mutation, ?lynch syndrome. Genetic test panel sent, awaiting for results.    Patient expressed understanding and was in agreement with this plan. She also understands that She can call clinic at any time with any questions, concerns, or complaints.   Cancer Staging Colon cancer [facility]) Staging form: Colon and Rectum, AJCC 8th Edition - Clinical stage from 07/06/2017: Stage IIIB (cT4a, cN1c, cM0) - Unsigned   Lloyd Huger, MD   09/11/2017 9:31 AM

## 2017-09-11 ENCOUNTER — Inpatient Hospital Stay: Payer: Medicare HMO

## 2017-09-11 ENCOUNTER — Inpatient Hospital Stay (HOSPITAL_BASED_OUTPATIENT_CLINIC_OR_DEPARTMENT_OTHER): Payer: Medicare HMO | Admitting: Oncology

## 2017-09-11 VITALS — BP 135/73 | Temp 97.4°F | Resp 18 | Wt 222.3 lb

## 2017-09-11 DIAGNOSIS — C541 Malignant neoplasm of endometrium: Secondary | ICD-10-CM | POA: Diagnosis not present

## 2017-09-11 DIAGNOSIS — C189 Malignant neoplasm of colon, unspecified: Secondary | ICD-10-CM | POA: Diagnosis not present

## 2017-09-11 DIAGNOSIS — Z79899 Other long term (current) drug therapy: Secondary | ICD-10-CM | POA: Diagnosis not present

## 2017-09-11 DIAGNOSIS — T451X5S Adverse effect of antineoplastic and immunosuppressive drugs, sequela: Secondary | ICD-10-CM | POA: Diagnosis not present

## 2017-09-11 DIAGNOSIS — R739 Hyperglycemia, unspecified: Secondary | ICD-10-CM

## 2017-09-11 DIAGNOSIS — C187 Malignant neoplasm of sigmoid colon: Secondary | ICD-10-CM

## 2017-09-11 DIAGNOSIS — Z5111 Encounter for antineoplastic chemotherapy: Secondary | ICD-10-CM | POA: Diagnosis not present

## 2017-09-11 DIAGNOSIS — D492 Neoplasm of unspecified behavior of bone, soft tissue, and skin: Secondary | ICD-10-CM | POA: Diagnosis not present

## 2017-09-11 DIAGNOSIS — E559 Vitamin D deficiency, unspecified: Secondary | ICD-10-CM | POA: Diagnosis not present

## 2017-09-11 DIAGNOSIS — I10 Essential (primary) hypertension: Secondary | ICD-10-CM | POA: Diagnosis not present

## 2017-09-11 DIAGNOSIS — D701 Agranulocytosis secondary to cancer chemotherapy: Secondary | ICD-10-CM | POA: Diagnosis not present

## 2017-09-11 LAB — COMPREHENSIVE METABOLIC PANEL
ALK PHOS: 133 U/L — AB (ref 38–126)
ALT: 21 U/L (ref 14–54)
ANION GAP: 7 (ref 5–15)
AST: 28 U/L (ref 15–41)
Albumin: 3.5 g/dL (ref 3.5–5.0)
BUN: 16 mg/dL (ref 6–20)
CALCIUM: 9.3 mg/dL (ref 8.9–10.3)
CO2: 23 mmol/L (ref 22–32)
Chloride: 110 mmol/L (ref 101–111)
Creatinine, Ser: 0.85 mg/dL (ref 0.44–1.00)
GFR calc non Af Amer: >60 mL/min (ref >60)
Glucose, Bld: 156 mg/dL — ABNORMAL HIGH (ref 65–99)
Potassium: 3.8 mmol/L (ref 3.5–5.1)
SODIUM: 140 mmol/L (ref 135–145)
Total Bilirubin: 0.9 mg/dL (ref 0.3–1.2)
Total Protein: 6.7 g/dL (ref 6.5–8.1)

## 2017-09-11 LAB — CBC WITH DIFFERENTIAL/PLATELET
Basophils Absolute: 0 10*3/uL (ref 0–0.1)
Basophils Relative: 1 %
EOS ABS: 0.3 10*3/uL (ref 0–0.7)
Eosinophils Relative: 7 %
HCT: 35.1 % (ref 35.0–47.0)
HEMOGLOBIN: 11.5 g/dL — AB (ref 12.0–16.0)
LYMPHS ABS: 1.2 10*3/uL (ref 1.0–3.6)
Lymphocytes Relative: 28 %
MCH: 29.3 pg (ref 26.0–34.0)
MCHC: 32.9 g/dL (ref 32.0–36.0)
MCV: 89 fL (ref 80.0–100.0)
MONO ABS: 0.4 10*3/uL (ref 0.2–0.9)
MONOS PCT: 10 %
Neutro Abs: 2.3 10*3/uL (ref 1.4–6.5)
Neutrophils Relative %: 54 %
PLATELETS: 228 10*3/uL (ref 150–440)
RBC: 3.94 MIL/uL (ref 3.80–5.20)
RDW: 17.3 % — ABNORMAL HIGH (ref 11.5–14.5)
WBC: 4.3 10*3/uL (ref 3.6–11.0)

## 2017-09-11 MED ORDER — DEXTROSE 5 % IV SOLN: 400.0000 mg/m2 | Freq: Once | INTRAVENOUS | Status: DC

## 2017-09-11 MED ORDER — SODIUM CHLORIDE 0.9 % IV SOLN
2400.0000 mg/m2 | INTRAVENOUS | Status: DC
  Administered 2017-09-11: 5200 mg via INTRAVENOUS
  Filled 2017-09-11: qty 104

## 2017-09-11 MED ORDER — DEXTROSE 5 % IV SOLN
Freq: Once | INTRAVENOUS | Status: AC
  Administered 2017-09-11: 10:00:00 via INTRAVENOUS
  Filled 2017-09-11: qty 1000

## 2017-09-11 MED ORDER — LEUCOVORIN CALCIUM INJECTION 100 MG
42.0000 mg | Freq: Once | INTRAMUSCULAR | Status: AC
  Administered 2017-09-11: 42 mg via INTRAVENOUS
  Filled 2017-09-11: qty 2.1

## 2017-09-11 MED ORDER — PALONOSETRON HCL INJECTION 0.25 MG/5ML
0.2500 mg | Freq: Once | INTRAVENOUS | Status: AC
  Administered 2017-09-11: 0.25 mg via INTRAVENOUS
  Filled 2017-09-11: qty 5

## 2017-09-11 MED ORDER — FLUOROURACIL CHEMO INJECTION 2.5 GM/50ML
400.0000 mg/m2 | Freq: Once | INTRAVENOUS | Status: AC
  Administered 2017-09-11: 850 mg via INTRAVENOUS
  Filled 2017-09-11: qty 17

## 2017-09-13 ENCOUNTER — Ambulatory Visit
Admission: RE | Admit: 2017-09-13 | Discharge: 2017-09-13 | Disposition: A | Discharge status: Home / Self Care | Payer: Medicare HMO | Source: Ambulatory Visit | Attending: Radiation Oncology | Admitting: Radiation Oncology

## 2017-09-13 ENCOUNTER — Encounter: Payer: Self-pay | Admitting: Radiation Oncology

## 2017-09-13 ENCOUNTER — Inpatient Hospital Stay: Payer: Medicare HMO

## 2017-09-13 VITALS — BP 111/70 | HR 66 | Temp 97.0°F | Resp 18

## 2017-09-13 VITALS — BP 111/70 | HR 66 | Temp 95.6°F | Resp 18 | Wt 220.8 lb

## 2017-09-13 DIAGNOSIS — C20 Malignant neoplasm of rectum: Secondary | ICD-10-CM | POA: Insufficient documentation

## 2017-09-13 DIAGNOSIS — I10 Essential (primary) hypertension: Secondary | ICD-10-CM | POA: Insufficient documentation

## 2017-09-13 DIAGNOSIS — Z8542 Personal history of malignant neoplasm of other parts of uterus: Secondary | ICD-10-CM | POA: Diagnosis not present

## 2017-09-13 DIAGNOSIS — Z90710 Acquired absence of both cervix and uterus: Secondary | ICD-10-CM | POA: Insufficient documentation

## 2017-09-13 DIAGNOSIS — K219 Gastro-esophageal reflux disease without esophagitis: Secondary | ICD-10-CM | POA: Diagnosis not present

## 2017-09-13 DIAGNOSIS — C541 Malignant neoplasm of endometrium: Secondary | ICD-10-CM | POA: Diagnosis not present

## 2017-09-13 DIAGNOSIS — Z800 Family history of malignant neoplasm of digestive organs: Secondary | ICD-10-CM | POA: Diagnosis not present

## 2017-09-13 DIAGNOSIS — C187 Malignant neoplasm of sigmoid colon: Secondary | ICD-10-CM | POA: Diagnosis not present

## 2017-09-13 DIAGNOSIS — Z923 Personal history of irradiation: Secondary | ICD-10-CM | POA: Diagnosis not present

## 2017-09-13 DIAGNOSIS — K5900 Constipation, unspecified: Secondary | ICD-10-CM | POA: Diagnosis not present

## 2017-09-13 DIAGNOSIS — Z803 Family history of malignant neoplasm of breast: Secondary | ICD-10-CM | POA: Diagnosis not present

## 2017-09-13 DIAGNOSIS — Z79899 Other long term (current) drug therapy: Secondary | ICD-10-CM | POA: Insufficient documentation

## 2017-09-13 DIAGNOSIS — K589 Irritable bowel syndrome without diarrhea: Secondary | ICD-10-CM | POA: Diagnosis not present

## 2017-09-13 DIAGNOSIS — R739 Hyperglycemia, unspecified: Secondary | ICD-10-CM | POA: Diagnosis not present

## 2017-09-13 DIAGNOSIS — Z9049 Acquired absence of other specified parts of digestive tract: Secondary | ICD-10-CM | POA: Diagnosis not present

## 2017-09-13 DIAGNOSIS — Z9221 Personal history of antineoplastic chemotherapy: Secondary | ICD-10-CM | POA: Insufficient documentation

## 2017-09-13 DIAGNOSIS — R197 Diarrhea, unspecified: Secondary | ICD-10-CM | POA: Diagnosis not present

## 2017-09-13 DIAGNOSIS — M129 Arthropathy, unspecified: Secondary | ICD-10-CM | POA: Insufficient documentation

## 2017-09-13 DIAGNOSIS — Z809 Family history of malignant neoplasm, unspecified: Secondary | ICD-10-CM | POA: Diagnosis not present

## 2017-09-13 DIAGNOSIS — E559 Vitamin D deficiency, unspecified: Secondary | ICD-10-CM | POA: Diagnosis not present

## 2017-09-13 DIAGNOSIS — Z5111 Encounter for antineoplastic chemotherapy: Secondary | ICD-10-CM | POA: Diagnosis not present

## 2017-09-13 DIAGNOSIS — Z90722 Acquired absence of ovaries, bilateral: Secondary | ICD-10-CM | POA: Diagnosis not present

## 2017-09-13 DIAGNOSIS — D701 Agranulocytosis secondary to cancer chemotherapy: Secondary | ICD-10-CM | POA: Diagnosis not present

## 2017-09-13 DIAGNOSIS — D492 Neoplasm of unspecified behavior of bone, soft tissue, and skin: Secondary | ICD-10-CM | POA: Diagnosis not present

## 2017-09-13 DIAGNOSIS — T451X5S Adverse effect of antineoplastic and immunosuppressive drugs, sequela: Secondary | ICD-10-CM | POA: Diagnosis not present

## 2017-09-13 MED ORDER — HEPARIN SOD (PORK) LOCK FLUSH 100 UNIT/ML IV SOLN
500.0000 [IU] | Freq: Once | INTRAVENOUS | Status: AC | PRN
  Administered 2017-09-13: 500 [IU]

## 2017-09-13 MED ORDER — SODIUM CHLORIDE 0.9% FLUSH
10.0000 mL | INTRAVENOUS | Status: DC | PRN
  Administered 2017-09-13: 10 mL
  Filled 2017-09-13: qty 10

## 2017-09-13 MED ORDER — HEPARIN SOD (PORK) LOCK FLUSH 100 UNIT/ML IV SOLN
INTRAVENOUS | Status: AC
  Filled 2017-09-13: qty 5

## 2017-09-13 NOTE — Consult Note (Signed)
NEW PATIENT EVALUATION  Name: [name]  MRN: [id number]  Date:   09/13/2017     DOB: 1941-05-11   This 76 y.o. female patient presents to the clinic for initial evaluation of locally advanced rectal cancer stage IIIB status post robotic colectomy in patient with prior history of endometrial carcinoma and vaginal brachytherapy.  REFERRING PHYSICIAN: Burnard Hawthorne, FNP  CHIEF COMPLAINT:  Chief Complaint  Patient presents with  . Cancer    Pt is here for 3 year follow up of endometrial cancer ,also here for new rectal cancer.      DIAGNOSIS: The primary encounter diagnosis was Endometrial cancer ([place]). A diagnosis of Malignant neoplasm of sigmoid colon [facility]) was also pertinent to this visit.   PREVIOUS INVESTIGATIONS:  CT scans reviewed Pathology report reviewed Clinical notes and past treatment plans reviewed  HPI: Patient is a 76 year old female former patient of mine who was treated for FIGO grade 1 endometrial adenocarcinoma 3 years prior status post TAH/BSO. She N1 vaginal brachytherapy receiving 2350 cGy in 5 fractions back in May 2014. She had a positive: ColoGuard test which prompted colonoscopy. Clinically showed a nonobstructing mass 20 cm from the anal verge which was biopsy positive for adenocarcinoma. Patient underwent a robotic partial colectomy showing moderately differentiated adenocarcinoma with lymphovascular space invasion. There were tumor nodules present. Tumor measured 5.5 cm and tumor invaded the visceral peritoneum. Resection margins were less than 1 mm from the peritoneal surface. 19 lymph nodes were showed no evidence of metastatic disease. He was deemed an N1 lesion because separate tumor nodules was within the lymph drainage area the primary tumor although there were multiple areas of tumor nodule involvement. Tumor was adherent to the pelvic sidewall. There was a loss of the MMR proteins major and minor. Interesting her initial CT scan did show mass  extending the on the wall the colon towards the left sidewall. Tumor was stage IIIB (T4 1 N1 c M0). She is referred to medical oncology and She has been started on 5-FU oxide platinum has been held. She is doing well. She is now referred to radiation oncology for consideration of treatment. She does have some minor intermittent diarrhea. No specific increased lower urinary tract symptoms. She has no residual complaints from her prior vaginal brachytherapy.  PLANNED TREATMENT REGIMEN: Adjuvant radiation therapy to her pelvis  PAST MEDICAL HISTORY:  has a past medical history of Arthritis; Chronic constipation; Complication of anesthesia; Endometrial cancer ([place]) (2014); GERD (gastroesophageal reflux disease); Hypertension; IBS (irritable bowel syndrome); Menopause; PONV (postoperative nausea and vomiting); and Vitamin D deficiency (06/07/10).    PAST SURGICAL HISTORY:  Past Surgical History:  Procedure Laterality Date  . BUNIONECTOMY    . COLONOSCOPY WITH PROPOFOL N/A 04/04/2017   Procedure: COLONOSCOPY WITH PROPOFOL;  Surgeon: [name], MD;  Location: [facility] ENDOSCOPY;  Service: Endoscopy;  Laterality: N/A;  . DILATION AND CURETTAGE OF UTERUS  1971  . FOOT SURGERY    . NASAL SINUS SURGERY    . PORTACATH PLACEMENT Right 07/12/2017   Procedure: INSERTION PORT-A-CATH;  Surgeon: Jules Husbands, MD;  Location: [facility] ORS;  Service: General;  Laterality: Right;  . ROBOTIC ASSISTED LAPAROSCOPIC VENTRAL/INCISIONAL HERNIA REPAIR N/A 06/07/2017   Procedure: ROBOTIC INCISIONAL HERNIA REPAIR;  Surgeon: Leighton Ruff, MD;  Location: [facility] ORS;  Service: General;  Laterality: N/A;  . VAGINAL DELIVERY    . VAGINAL HYSTERECTOMY      FAMILY HISTORY: family history includes Bipolar disorder in her maternal aunt and sister;  Breast cancer in her maternal aunt; Breast cancer (age of onset: 71) in her sister; Cancer in her maternal grandfather and sister; Colon cancer in her mother; Colon cancer (age of onset: 3) in her  maternal grandfather; Diabetes in her father; Heart disease (age of onset: 66) in her father; Osteoporosis in her mother and sister.  SOCIAL HISTORY:  reports that she has never smoked. She has never used smokeless tobacco. She reports that she drinks alcohol. She reports that she does not use drugs.  ALLERGIES: Codeine  MEDICATIONS:  Current Outpatient Prescriptions  Medication Sig Dispense Refill  . chlorhexidine (PERIDEX) 0.12 % solution Use as directed 15 mLs in the mouth or throat 2 (two) times daily. 473 mL 0  . fluticasone (FLONASE) 50 MCG/ACT nasal spray Place 2 sprays into both nostrils daily. (Patient taking differently: Place 2 sprays into both nostrils daily as needed for allergies. ) 16 g 2  . lidocaine-prilocaine (EMLA) cream Apply 1 application topically as needed. 30 g 0  . lidocaine-prilocaine (EMLA) cream Apply to affected area once 30 g 3  . losartan (COZAAR) 50 MG tablet Take 1 tablet (50 mg total) by mouth daily. 90 tablet 3  . Magnesium 400 MG TABS Take 400 mg by mouth daily as needed (irritable bowel syndrome).    . metoprolol succinate (TOPROL-XL) 100 MG 24 hr tablet Take 1 tablet (100 mg total) by mouth daily. 90 tablet 3  . Multiple Vitamin (MULTIVITAMIN WITH MINERALS) TABS tablet Take 1 tablet by mouth daily.    . Multiple Vitamins-Minerals (PRESERVISION AREDS 2) CAPS Take 1 capsule by mouth 2 (two) times daily.    . ondansetron (ZOFRAN) 8 MG tablet Take 1 tablet (8 mg total) by mouth 2 (two) times daily as needed for refractory nausea / vomiting. Start on day 3 after chemotherapy. 30 tablet 1  . OVER THE COUNTER MEDICATION Take 1 capsule by mouth daily after breakfast. IBgard    . Polyethylene Glycol 3350 (MIRALAX PO) Take 17 g by mouth daily as needed (constipation).     . prochlorperazine (COMPAZINE) 10 MG tablet Take 1 tablet (10 mg total) by mouth every 6 (six) hours as needed (Nausea or vomiting). 30 tablet 1  . simvastatin (ZOCOR) 20 MG tablet Take 1 tablet (20  mg total) by mouth every evening. 90 tablet 4  . topiramate (TOPAMAX) 50 MG tablet Take 1 tablet (50 mg total) by mouth 2 (two) times daily. 180 tablet 2   No current facility-administered medications for this encounter.    Facility-Administered Medications Ordered in Other Encounters  Medication Dose Route Frequency Provider Last Rate Last Dose  . sodium chloride flush (NS) 0.9 % injection 10 mL  10 mL Intracatheter PRN Lloyd Huger, MD   10 mL at 09/13/17 1030    ECOG PERFORMANCE STATUS:  0 - Asymptomatic  REVIEW OF SYSTEMS:  Patient denies any weight loss, fatigue, weakness, fever, chills or night sweats. Patient denies any loss of vision, blurred vision. Patient denies any ringing  of the ears or hearing loss. No irregular heartbeat. Patient denies heart murmur or history of fainting. Patient denies any chest pain or pain radiating to her upper extremities. Patient denies any shortness of breath, difficulty breathing at night, cough or hemoptysis. Patient denies any swelling in the lower legs. Patient denies any nausea vomiting, vomiting of blood, or coffee ground material in the vomitus. Patient denies any stomach pain. Patient states has had normal bowel movements no significant constipation or diarrhea. Patient  denies any dysuria, hematuria or significant nocturia. Patient denies any problems walking, swelling in the joints or loss of balance. Patient denies any skin changes, loss of hair or loss of weight. Patient denies any excessive worrying or anxiety or significant depression. Patient denies any problems with insomnia. Patient denies excessive thirst, polyuria, polydipsia. Patient denies any swollen glands, patient denies easy bruising or easy bleeding. Patient denies any recent infections, allergies or URI. Patient "s visual fields have not changed significantly in recent time.    PHYSICAL EXAM: BP 111/70   Pulse 66   Temp (!) 95.6 F (35.3 C)   Resp 18   Wt 220 lb 12.7 oz  (100.1 kg)   BMI 36.74 kg/m  On speculum examination vaginal vault is clear vaginal apex shows no evidence of mass or nodularity. Bimanual examination shows no evidence of parametrial mass. Rectal exam is unremarkable. Well-developed well-nourished patient in NAD. HEENT reveals PERLA, EOMI, discs not visualized.  Oral cavity is clear. No oral mucosal lesions are identified. Neck is clear without evidence of cervical or supraclavicular adenopathy. Lungs are clear to A&P. Cardiac examination is essentially unremarkable with regular rate and rhythm without murmur rub or thrill. Abdomen is benign with no organomegaly or masses noted. Motor sensory and DTR levels are equal and symmetric in the upper and lower extremities. Cranial nerves II through XII are grossly intact. Proprioception is intact. No peripheral adenopathy or edema is identified. No motor or sensory levels are noted. Crude visual fields are within normal range.  LABORATORY DATA: Pathology reports reviewed    RADIOLOGY RESULTS: CT scans reviewed   IMPRESSION: Stage IIIB adenocarcinoma of the rectum status post partial colectomy in 76 year old female with prior history of vaginal brachytherapy for endometrial adenocarcinoma  PLAN: I reviewed her prior treatment with brachytherapy approximately 4 years prior. I believe there is room for pelvic radiation therapy. I would plan on delivering 4500 cGy over 5 weeks with 5-FU chemotherapy to decrease the chances of local regional recurrence. This is based on lymphovascular space invasion, pelvic sidewall involvement, and multiple tumor nodules. I will see her back in follow-up after completion of chemotherapy and will discuss the case with medical oncology personally to may be treat her concurrently with 5-FU chemotherapy in the adjuvant setting. Risks and benefits of radiation therapy including increased lower urinary tract symptoms diarrhea fatigue alteration of blood counts and possible further  risk second or 2 prior brachytherapy all were discussed in detail with the patient. She seems to comprehend my treatment plan well.  I would like to take this opportunity to thank you for allowing me to participate in the care of your patient.Armstead Peaks., MD

## 2017-09-19 ENCOUNTER — Ambulatory Visit: Payer: Medicare HMO

## 2017-09-23 DIAGNOSIS — C189 Malignant neoplasm of colon, unspecified: Secondary | ICD-10-CM | POA: Diagnosis not present

## 2017-09-25 ENCOUNTER — Encounter: Payer: Self-pay | Admitting: Family

## 2017-09-25 ENCOUNTER — Other Ambulatory Visit: Payer: Self-pay

## 2017-09-25 ENCOUNTER — Inpatient Hospital Stay: Payer: Medicare HMO | Attending: Oncology | Admitting: Oncology

## 2017-09-25 ENCOUNTER — Encounter: Payer: Self-pay | Admitting: Oncology

## 2017-09-25 ENCOUNTER — Inpatient Hospital Stay: Payer: Medicare HMO

## 2017-09-25 VITALS — BP 118/71 | HR 65 | Temp 94.8°F | Resp 18 | Wt 221.8 lb

## 2017-09-25 DIAGNOSIS — Z8542 Personal history of malignant neoplasm of other parts of uterus: Secondary | ICD-10-CM

## 2017-09-25 DIAGNOSIS — K589 Irritable bowel syndrome without diarrhea: Secondary | ICD-10-CM | POA: Insufficient documentation

## 2017-09-25 DIAGNOSIS — C189 Malignant neoplasm of colon, unspecified: Secondary | ICD-10-CM | POA: Diagnosis not present

## 2017-09-25 DIAGNOSIS — K5909 Other constipation: Secondary | ICD-10-CM | POA: Insufficient documentation

## 2017-09-25 DIAGNOSIS — Z923 Personal history of irradiation: Secondary | ICD-10-CM | POA: Diagnosis not present

## 2017-09-25 DIAGNOSIS — Z79899 Other long term (current) drug therapy: Secondary | ICD-10-CM | POA: Diagnosis not present

## 2017-09-25 DIAGNOSIS — K219 Gastro-esophageal reflux disease without esophagitis: Secondary | ICD-10-CM | POA: Diagnosis not present

## 2017-09-25 DIAGNOSIS — C187 Malignant neoplasm of sigmoid colon: Secondary | ICD-10-CM

## 2017-09-25 DIAGNOSIS — E559 Vitamin D deficiency, unspecified: Secondary | ICD-10-CM | POA: Diagnosis not present

## 2017-09-25 DIAGNOSIS — Z5111 Encounter for antineoplastic chemotherapy: Secondary | ICD-10-CM | POA: Diagnosis not present

## 2017-09-25 DIAGNOSIS — G620 Drug-induced polyneuropathy: Secondary | ICD-10-CM

## 2017-09-25 DIAGNOSIS — T451X5A Adverse effect of antineoplastic and immunosuppressive drugs, initial encounter: Secondary | ICD-10-CM

## 2017-09-25 DIAGNOSIS — Z800 Family history of malignant neoplasm of digestive organs: Secondary | ICD-10-CM

## 2017-09-25 DIAGNOSIS — I10 Essential (primary) hypertension: Secondary | ICD-10-CM | POA: Insufficient documentation

## 2017-09-25 DIAGNOSIS — R739 Hyperglycemia, unspecified: Secondary | ICD-10-CM

## 2017-09-25 DIAGNOSIS — D701 Agranulocytosis secondary to cancer chemotherapy: Secondary | ICD-10-CM

## 2017-09-25 DIAGNOSIS — Z803 Family history of malignant neoplasm of breast: Secondary | ICD-10-CM

## 2017-09-25 LAB — CBC WITH DIFFERENTIAL/PLATELET
Basophils Absolute: 0.1 10*3/uL (ref 0–0.1)
Basophils Relative: 1 %
Eosinophils Absolute: 0.1 10*3/uL (ref 0–0.7)
Eosinophils Relative: 2 %
HCT: 35.7 % (ref 35.0–47.0)
HEMOGLOBIN: 11.7 g/dL — AB (ref 12.0–16.0)
LYMPHS ABS: 1.2 10*3/uL (ref 1.0–3.6)
Lymphocytes Relative: 24 %
MCH: 29.7 pg (ref 26.0–34.0)
MCHC: 32.9 g/dL (ref 32.0–36.0)
MCV: 90.1 fL (ref 80.0–100.0)
MONOS PCT: 10 %
Monocytes Absolute: 0.5 10*3/uL (ref 0.2–0.9)
NEUTROS PCT: 63 %
Neutro Abs: 3.2 10*3/uL (ref 1.4–6.5)
Platelets: 197 10*3/uL (ref 150–440)
RBC: 3.96 MIL/uL (ref 3.80–5.20)
RDW: 17.9 % — ABNORMAL HIGH (ref 11.5–14.5)
WBC: 5.1 10*3/uL (ref 3.6–11.0)

## 2017-09-25 LAB — COMPREHENSIVE METABOLIC PANEL
ALT: 18 U/L (ref 14–54)
AST: 28 U/L (ref 15–41)
Albumin: 3.5 g/dL (ref 3.5–5.0)
Alkaline Phosphatase: 136 U/L — ABNORMAL HIGH (ref 38–126)
Anion gap: 7 (ref 5–15)
BUN: 17 mg/dL (ref 6–20)
CHLORIDE: 111 mmol/L (ref 101–111)
CO2: 22 mmol/L (ref 22–32)
Calcium: 9.2 mg/dL (ref 8.9–10.3)
Creatinine, Ser: 0.84 mg/dL (ref 0.44–1.00)
Glucose, Bld: 146 mg/dL — ABNORMAL HIGH (ref 65–99)
POTASSIUM: 3.6 mmol/L (ref 3.5–5.1)
SODIUM: 140 mmol/L (ref 135–145)
Total Bilirubin: 0.6 mg/dL (ref 0.3–1.2)
Total Protein: 6.6 g/dL (ref 6.5–8.1)

## 2017-09-25 MED ORDER — HEPARIN SOD (PORK) LOCK FLUSH 100 UNIT/ML IV SOLN: 500.0000 [IU] | Freq: Once | INTRAVENOUS | Status: DC

## 2017-09-25 MED ORDER — SODIUM CHLORIDE 0.9% FLUSH
10.0000 mL | Freq: Once | INTRAVENOUS | Status: AC
  Administered 2017-09-25: 10 mL via INTRAVENOUS
  Filled 2017-09-25: qty 10

## 2017-09-25 MED ORDER — LEUCOVORIN CALCIUM INJECTION 100 MG
42.0000 mg | Freq: Once | INTRAMUSCULAR | Status: AC
  Administered 2017-09-25: 42 mg via INTRAVENOUS
  Filled 2017-09-25: qty 2.1

## 2017-09-25 MED ORDER — FLUOROURACIL CHEMO INJECTION 2.5 GM/50ML
400.0000 mg/m2 | Freq: Once | INTRAVENOUS | Status: AC
  Administered 2017-09-25: 850 mg via INTRAVENOUS
  Filled 2017-09-25: qty 17

## 2017-09-25 MED ORDER — LEUCOVORIN CALCIUM INJECTION 350 MG: 400.0000 mg/m2 | Freq: Once | INTRAVENOUS | Status: DC

## 2017-09-25 MED ORDER — DEXTROSE 5 % IV SOLN
Freq: Once | INTRAVENOUS | Status: AC
  Administered 2017-09-25: 09:00:00 via INTRAVENOUS
  Filled 2017-09-25: qty 1000

## 2017-09-25 MED ORDER — PALONOSETRON HCL INJECTION 0.25 MG/5ML
0.2500 mg | Freq: Once | INTRAVENOUS | Status: AC
  Administered 2017-09-25: 0.25 mg via INTRAVENOUS
  Filled 2017-09-25: qty 5

## 2017-09-25 MED ORDER — SODIUM CHLORIDE 0.9 % IV SOLN
2400.0000 mg/m2 | INTRAVENOUS | Status: DC
  Administered 2017-09-25: 5200 mg via INTRAVENOUS
  Filled 2017-09-25: qty 104

## 2017-09-25 NOTE — Progress Notes (Signed)
Here for follow up. C/o diarrhea x 4 3 days ago- no longer an issue

## 2017-09-25 NOTE — Progress Notes (Signed)
Nutrition Follow-up:  Patient with colon cancer, followed by Dr. Tasia Catchings.  Patient seen during infusion today.  Patient reports appetite is good, still struggles with enjoying food due to lack of taste from chronic sinusitis issues.  Reports that yesterday she ate cereal and 1/2 bagel with peanut butter, cottage cheese and fruit and salad with cheese and for dinner had frozen meal of [place], stuffing and vegetable.  Had a snack of celery and peanut butter as well.  Reports that she is drinking 1-2 ensure per day.  No issues with nausea and only 1 episode of diarrhea.  Looking forward to spending Thanksgiving with friends and eating stuffing.   Medications:  reviewed  Labs: glucose 146  Anthropometrics:   Weight stable at 221 lb 12.8 oz today, noted 221 lb on 10/08.  Weight 225 lb on 9/24   NUTRITION DIAGNOSIS: Inadequate oral intake improving   MALNUTRITION DIAGNOSIS: continue to monitor   INTERVENTION:   Encouraged patient to continue to focus on good nutrition and to include good sources of protein at every meal. Encouraged intake of oral nutrition supplements 1-2 per day.    MONITORING, EVALUATION, GOAL: weight trends, intake   NEXT VISIT: as needed  Leen Tworek B. Zenia Resides, [place], [place] Registered Dietitian [phone] (pager)

## 2017-09-25 NOTE — Progress Notes (Signed)
Hematology/Oncology Follow Up visit. [place] [phone] [phone]  CONSULT NOTE Patient Care Team: Burnard Hawthorne, FNP as PCP - General (Family Medicine) Vladimir Crofts, MD (Neurology) Noreene Filbert, MD as Referring Physician (Radiation Oncology)  PURPOSE OF CONSULTATION:  Follow up for treatment of Colon cancer   HISTORY OF PRESENTING ILLNESS:  [name] 76 y.o.  female with PMH listed as below was referred to me for evaluation and management of colon cancer.  She had colonoscopy evaluation due to positive cologuard.  She was found to have a nonobstructing mass approximately 20 cm from the anal verge which biopsy proved to be adenocarcinoma. CT scan showed a large mass from the sigmoid and extends beyond the wall of the colon toward the left into the surrounding fat. Preoperational CEA was normal. Patient underwent robotic sigmoidectomy on 06/07/2017.   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 MLH1  loss of function, PMS2 loss of function. Declined option of clinical trial ATOMIC She has a history of endometrial cancer s/p hysterotomy and radiation. She has a family history of colon cancer and breast cancer.   INTERVAL HISTORY 76 yo female with above history presents to evaluation prior to cycle 5  Adjuvant chemotherapy. Patient reports 1 episode of diarrhea which resolved after taking Imodium. She has some hand skin peeling. Otherwise doing well no nausea or vomiting. Neuropathy has completely resolved. Denies shortness of breath chest pain abdominal pain fever or chills.   Review of Systems  Constitutional: Negative for chills and fatigue.  HENT:   Negative for hearing loss.   Eyes: Negative for eye problems.  Respiratory: Negative for chest tightness and cough.   Cardiovascular: Negative for chest pain.    Gastrointestinal: Negative for abdominal distention.  Endocrine: Negative for hot flashes.  Genitourinary: Negative for dysuria.   Musculoskeletal: Negative for arthralgias.  Skin:       Palm skin peeling.   Neurological: Negative for dizziness.  Hematological: Negative for adenopathy.  Psychiatric/Behavioral: The patient is not nervous/anxious.    MEDICAL HISTORY:  Past Medical History:  Diagnosis Date  . Arthritis    bitateral knees and left foot,s/p cortisone injection in past  . Chronic constipation   . Complication of anesthesia     slow to wake up   . Endometrial cancer ([place]) 2014   Total Hysterectomy and Rad tx's.   Marland Kitchen GERD (gastroesophageal reflux disease)   . Hypertension   . IBS (irritable bowel syndrome)   . Menopause    age 69  . PONV (postoperative nausea and vomiting)   . Vitamin D deficiency 06/07/10    SURGICAL HISTORY: Past Surgical History:  Procedure Laterality Date  . BUNIONECTOMY    . DILATION AND CURETTAGE OF UTERUS  1971  . FOOT SURGERY    . NASAL SINUS SURGERY    . VAGINAL DELIVERY    . VAGINAL HYSTERECTOMY      SOCIAL HISTORY: Social History   Socioeconomic History  . Marital status: Divorced    Spouse name: Not on file  . Number of children: Not on file  . Years of education: Not on file  . Highest education level: Not on file  Social Needs  . Financial resource strain: Not on file  . Food insecurity - worry: Not on file  . Food insecurity - inability: Not on file  . Transportation needs - medical: Not on  file  . Transportation needs - non-medical: Not on file  Occupational History  . Not on file  Tobacco Use  . Smoking status: Never Smoker  . Smokeless tobacco: Never Used  Substance and Sexual Activity  . Alcohol use: Yes    Comment: socially/rarely  . Drug use: No  . Sexual activity: No  Other Topics Concern  . Not on file  Social History Narrative   Exercise- aerobic exercise at home 3-4x week.       Lives in  [place]. No pets.    FAMILY HISTORY: Family History  Problem Relation Age of Onset  . Heart disease Father 28       massive MI  . Diabetes Father   . Cancer Sister        breast  . Bipolar disorder Sister   . Osteoporosis Sister   . Breast cancer Sister 80  . Bipolar disorder Maternal Aunt   . Breast cancer Maternal Aunt   . Cancer Maternal Grandfather        colon cancer  . Colon cancer Maternal Grandfather 65  . Osteoporosis Mother   . Colon cancer Mother     ALLERGIES:  is allergic to codeine.  MEDICATIONS:  Current Outpatient Medications  Medication Sig Dispense Refill  . chlorhexidine (PERIDEX) 0.12 % solution Use as directed 15 mLs in the mouth or throat 2 (two) times daily. 473 mL 0  . lidocaine-prilocaine (EMLA) cream Apply 1 application topically as needed. 30 g 0  . losartan (COZAAR) 50 MG tablet Take 1 tablet (50 mg total) by mouth daily. 90 tablet 3  . Magnesium 400 MG TABS Take 400 mg by mouth daily as needed (irritable bowel syndrome).    . metoprolol succinate (TOPROL-XL) 100 MG 24 hr tablet Take 1 tablet (100 mg total) by mouth daily. 90 tablet 3  . Multiple Vitamin (MULTIVITAMIN WITH MINERALS) TABS tablet Take 1 tablet by mouth daily.    . simvastatin (ZOCOR) 20 MG tablet Take 1 tablet (20 mg total) by mouth every evening. 90 tablet 4  . topiramate (TOPAMAX) 50 MG tablet Take 1 tablet (50 mg total) by mouth 2 (two) times daily. 180 tablet 2  . fluticasone (FLONASE) 50 MCG/ACT nasal spray Place 2 sprays into both nostrils daily. (Patient not taking: Reported on 09/25/2017) 16 g 2  . ondansetron (ZOFRAN) 8 MG tablet Take 1 tablet (8 mg total) by mouth 2 (two) times daily as needed for refractory nausea / vomiting. Start on day 3 after chemotherapy. (Patient not taking: Reported on 09/25/2017) 30 tablet 1  . OVER THE COUNTER MEDICATION Take 1 capsule by mouth daily after breakfast. IBgard    . Polyethylene Glycol 3350 (MIRALAX PO) Take 17 g by mouth daily as  needed (constipation).     . prochlorperazine (COMPAZINE) 10 MG tablet Take 1 tablet (10 mg total) by mouth every 6 (six) hours as needed (Nausea or vomiting). (Patient not taking: Reported on 09/25/2017) 30 tablet 1   No current facility-administered medications for this visit.    Facility-Administered Medications Ordered in Other Visits  Medication Dose Route Frequency Provider Last Rate Last Dose  . heparin lock flush 100 unit/mL  500 Units Intravenous Once Earlie Server, MD          .  PHYSICAL EXAMINATION: ECOG PERFORMANCE STATUS: 0 - Asymptomatic Vitals:   09/25/17 0828  BP: 118/71  Pulse: 65  Resp: 18  Temp: (!) 94.8 F (34.9 C)   Filed Weights  09/25/17 0828  Weight: 221 lb 12.8 oz (100.6 kg)    GENERAL: No distress, well nourished.  SKIN:  No rashes or significant lesions  HEAD: Normocephalic, No masses, lesions, tenderness or abnormalities  EYES: Conjunctiva are pink, non icteric ENT: External ears normal ,lips , buccal mucosa, and tongue normal and mucous membranes are moist  LYMPH: No palpable cervical and axillary lymphadenopathy  LUNGS: Clear to auscultation, no crackles or wheezes HEART: Regular rate & rhythm, no murmurs, no gallops, S1 normal and S2 normal  ABDOMEN: Abdomen soft, non-tender, normal bowel sounds, I did not appreciate any  masses or organomegaly  MUSCULOSKELETAL: No CVA tenderness and no tenderness on percussion of the back or rib cage.  EXTREMITIES: No edema, no skin discoloration or tenderness NEURO: Alert & oriented, no focal motor/sensory deficits. Marland Kitchen  LABORATORY DATA:  I have reviewed the data as listed Lab Results  Component Value Date   WBC 5.1 09/25/2017   HGB 11.7 (L) 09/25/2017   HCT 35.7 09/25/2017   MCV 90.1 09/25/2017   PLT 197 09/25/2017   Recent Labs    08/23/17 0904 08/28/17 0816 09/11/17 0808  NA 138 141 140  K 3.5 3.6 3.8  CL 108 109 110  CO2 21* 23 23  GLUCOSE 197* 145* 156*  BUN '9 13 16  ' CREATININE 0.94 0.78  0.85  CALCIUM 9.2 9.1 9.3  GFRNONAA 57* >60 >60  GFRAA >60 >60 >60  PROT 6.7 6.6 6.7  ALBUMIN 3.6 3.5 3.5  AST 33 29 28  ALT '22 22 21  ' ALKPHOS 138* 132* 133*  BILITOT 0.8 0.5 0.9    RADIOGRAPHIC STUDIES: I have personally reviewed the radiological images as listed and agreed with the findings in the report. 04/12/2017 CT abdomen pelvis w contrast Large mass arising from the sigmoid colon. This mass extends beyond the wall of the colon toward the left into the surrounding fat. This lesion does not reach the left pelvic sidewall.No adenopathy or liver lesions evident. No omental lesions appreciable. Sizable midline ventral hernia slightly superior to theumbilicus containing fat and a loop of colon without bowel compromise. Much smaller midline umbilical ventral hernia containingonly fat. There is a small hiatal hernia. No bowel obstruction.  No abscess.  Appendix region appears normal. No renal or ureteral calculus.  No hydronephrosis.There is aortoiliac atherosclerosis. There are foci of coronary artery calcification.   ASSESSMENT & PLAN:  76 y.o.female who has Stage IIIB colon cancer.  Cancer Staging Colon cancer [facility]) Staging form: Colon and Rectum, AJCC 8th Edition - Clinical stage from 07/06/2017: Stage IIIB (cT4a, cN1c, cM0) - Unsigned  1. Malignant neoplasm of sigmoid colon ([place])   2. Encounter for antineoplastic chemotherapy   3. 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 History of endometrial cancer   6. Family history of breast cancer   7. Family history of colon cancer     # chemotherapy induced neutropenia,resolved today.  Proceed cycle 5 treatment today. Oxaliplatin was discontinued due to neuropathy. #  T4 tumor, refer to RadOnc to discuss RT after chemotherapy. She had radiation previously due to endometrial cancer. He has been seen by Dr. Baruch Gouty and deemed a candidate for radiation. Well set up appointment with  RadOnc after she finsihes adjuvant chemother    # Neuropathy resolved. Continue Vitamin B6 supplementation.   # Hyperglycemia: HbA1C 5.9, follow up with primary care physician #  Significant personal history, somatic MLH1  loss of function,  PMS2 loss of function.  family history suspecting germline mutation, ?lynch syndrome.  Will refer to genetic testing.  # Palm skin peeling: educated patient that this could be from 5 FU toxicity. Advice her to apply cream on both palm and bottom of feet.   All questions were answered. The patient knows to call the clinic with any problems questions or concerns. Return of visit: She will see me in 2weeks prior to cycle 6 adjuvant 5-FU, cbc, cmp prior to treatment  Earlie Server, MD, PhD Hematology Oncology [facility] at [facility] Pager- [phone] 09/25/2017

## 2017-09-26 ENCOUNTER — Telehealth: Payer: Self-pay | Admitting: Genetic Counselor

## 2017-09-26 ENCOUNTER — Encounter: Payer: Self-pay | Admitting: Genetic Counselor

## 2017-09-26 DIAGNOSIS — Z1379 Encounter for other screening for genetic and chromosomal anomalies: Secondary | ICD-10-CM

## 2017-09-26 DIAGNOSIS — Z809 Family history of malignant neoplasm, unspecified: Secondary | ICD-10-CM

## 2017-09-26 HISTORY — DX: Encounter for other screening for genetic and chromosomal anomalies: Z13.79

## 2017-09-26 NOTE — Telephone Encounter (Signed)
[name] was referred for genetic counseling by Dr. Tasia Catchings due to a personal and family history of cancer. She is scheduled for a phone genetic counseling visit for later this afternoon on 09/26/17.   Steele Berg, [place], [place] Genetic Counselor Phone: [phone]

## 2017-09-26 NOTE — Telephone Encounter (Signed)
Cancer Genetics            Telegenetics Initial Visit    Patient Name: RUHAMA LEHEW Patient DOB: [birth year] Patient Age: 76 y.o. Phone Call Date: 09/26/2017  Referring Provider: Earlie Server, MD  Reason for Visit: Evaluate for hereditary susceptibility to cancer    Assessment and Plan:  . Ms. Putz's personal history of both uterine and colon cancers is somewhat suggestive of a hereditary predisposition to cancer, even though she was not diagnosed at young ages. It is unclear whether the lack of expression of hMLH1/hPMS2 and the MSI-high results are truly due to Lynch syndrome since BRAF or hypermethylation studies have not been done. She has a family history of colorectal cancers on her maternal side, but these are also not occurring at young ages. Of note, her father died at a young age and had only one sister who also died young. This limited family structure makes risk assessment challenging.   . Testing is recommended to determine whether she has a pathogenic mutation that will impact her screening and risk-reduction for cancer. A negative result will be reassuring.  . Ms. Ballentine wished to pursue genetic testing and will have her blood drawn on 10/09/17 when she is already scheduled for routine blood work. Analysis will include the 83 genes on [facility]'s Multi-Cancer panel (ALK, APC, ATM, AXIN2, BAP1, BARD1, BLM, BMPR1A, BRCA1, BRCA2, BRIP1, CASR, CDC73, CDH1, CDK4, CDKN1B, CDKN1C, CDKN2A, CEBPA, CHEK2, CTNNA1, DICER1, DIS3L2, EGFR, EPCAM, FH, FLCN, GATA2, GPC3, GREM1, HOXB13, HRAS, KIT, MAX, MEN1, MET, MITF, MLH1, MSH2, MSH3, MSH6, MUTYH, NBN, NF1, NF2, NTHL1, PALB2, PDGFRA, PHOX2B, PMS2, POLD1, POLE, POT1, PRKAR1A, PTCH1, PTEN, RAD50, RAD51C, RAD51D, RB1, RECQL4, RET, RUNX1, SDHA, SDHAF2, SDHB, SDHC, SDHD, SMAD4, SMARCA4, SMARCB1, SMARCE1, STK11, SUFU, TERC, TERT, TMEM127, TP53, TSC1, TSC2, VHL, WRN, WT1).   . Results should be available in approximately 2-3 weeks once  received by the lab, at which point we will contact her and address implications for her as well as address genetic testing for at-risk family members, if needed.     Dr. Grayland Ormond was available for questions concerning this case. Total time spent by counseling by phone was approximately 30 minutes.   _____________________________________________________________________   History of Present Illness: Ms. RALONDA TARTT, a 76 y.o. female, was referred for genetic counseling to discuss the possibility of a hereditary predisposition to cancer and discuss whether genetic testing is warranted. This was a telegenetics visit via phone.  Ms. Saxby was recently diagnosed with colon cancer at the age of 89. She is s/p surgery and is receiving chemotherapy.  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 There was no record that either BRAF or hypermethylation testing has been done.  Ms. Tuohy also has a history of endometrial cancer at age 22 for which she had TAH/BSO and radiation.    Colon cancer ([place])   06/07/2017 Initial Diagnosis    Colon cancer [facility])        Past Medical History:  Diagnosis Date  . Arthritis    bitateral knees and left foot,s/p cortisone injection in past  . Chronic constipation   . Complication of anesthesia     slow to wake up   . Endometrial cancer ([place]) 2014   Total Hysterectomy and Rad tx's.   . Family history of cancer   . GERD (gastroesophageal reflux disease)   .  Hypertension   . IBS (irritable bowel syndrome)   . Menopause    age 56  . PONV (postoperative nausea and vomiting)   . Vitamin D deficiency 06/07/10    Past Surgical History:  Procedure Laterality Date  . BUNIONECTOMY    . DILATION AND CURETTAGE OF UTERUS  1971  . FOOT SURGERY    . NASAL SINUS SURGERY    . VAGINAL DELIVERY    . VAGINAL HYSTERECTOMY      Family History: Significant diagnoses include the following:  Family History  Problem  Relation Age of Onset  . Heart disease Father 21       massive MI  . Diabetes Father   . Bipolar disorder Sister   . Osteoporosis Sister   . Breast cancer Sister 18       currently 72  . Bipolar disorder Maternal Aunt   . Breast cancer Maternal Aunt        age at dx unknown; deceased 75s  . Colon cancer Maternal Grandfather 74       deceased 34  . Osteoporosis Mother   . Colon cancer Mother        age at dx unknown; deceased 23  . Cancer Maternal Grandmother        unk. type; deceased 32  . Breast cancer Paternal Aunt        deceased 66    Additionally, Ms. Zahner has a son (age 69) and a daughter (age 71). She had two sisters who passed away in addition to the sister with breast cancer noted above. Her mother had a total of 2 sisters and a brother. Her father had only the one sister with breast cancer noted above.  Ms. Luckenbaugh ancestry is Caucasian - NOS. There is no known Jewish ancestry and no consanguinity.  Discussion: We reviewed the characteristics, features and inheritance patterns of hereditary cancer syndromes. We discussed her risk of harboring a mutation in the context of her personal and family history. We reviewed the results of her tumor testing showing MSI-high and absent MMR proteins hMLH1/hPMS2. We discussed that her small paternal family and her father's early death makes risk assessment somewhat challenging. We discussed the process of genetic testing, insurance coverage and implications of results: positive, negative and variant of unknown significance (VUS).   Ms. [place] questions were answered to her satisfaction today and she is welcome to call with any additional questions or concerns. Thank you for the referral and allowing [place] to share in the care of your patient.    Steele Berg, MS, [place] Certified Genetic Counselor phone: [phone]

## 2017-09-27 ENCOUNTER — Inpatient Hospital Stay: Payer: Medicare HMO

## 2017-09-27 VITALS — BP 132/82 | HR 67 | Temp 97.0°F | Resp 18

## 2017-09-27 DIAGNOSIS — Z5111 Encounter for antineoplastic chemotherapy: Secondary | ICD-10-CM | POA: Diagnosis not present

## 2017-09-27 DIAGNOSIS — C187 Malignant neoplasm of sigmoid colon: Secondary | ICD-10-CM

## 2017-09-27 DIAGNOSIS — Z923 Personal history of irradiation: Secondary | ICD-10-CM | POA: Diagnosis not present

## 2017-09-27 DIAGNOSIS — I10 Essential (primary) hypertension: Secondary | ICD-10-CM | POA: Diagnosis not present

## 2017-09-27 DIAGNOSIS — Z79899 Other long term (current) drug therapy: Secondary | ICD-10-CM | POA: Diagnosis not present

## 2017-09-27 DIAGNOSIS — Z8542 Personal history of malignant neoplasm of other parts of uterus: Secondary | ICD-10-CM | POA: Diagnosis not present

## 2017-09-27 DIAGNOSIS — K219 Gastro-esophageal reflux disease without esophagitis: Secondary | ICD-10-CM | POA: Diagnosis not present

## 2017-09-27 DIAGNOSIS — E559 Vitamin D deficiency, unspecified: Secondary | ICD-10-CM | POA: Diagnosis not present

## 2017-09-27 DIAGNOSIS — R739 Hyperglycemia, unspecified: Secondary | ICD-10-CM | POA: Diagnosis not present

## 2017-09-27 MED ORDER — SODIUM CHLORIDE 0.9% FLUSH
10.0000 mL | INTRAVENOUS | Status: DC | PRN
  Administered 2017-09-27: 10 mL
  Filled 2017-09-27: qty 10

## 2017-09-27 MED ORDER — HEPARIN SOD (PORK) LOCK FLUSH 100 UNIT/ML IV SOLN
500.0000 [IU] | Freq: Once | INTRAVENOUS | Status: AC | PRN
  Administered 2017-09-27: 500 [IU]

## 2017-09-27 MED ORDER — HEPARIN SOD (PORK) LOCK FLUSH 100 UNIT/ML IV SOLN
INTRAVENOUS | Status: AC
  Filled 2017-09-27: qty 5

## 2017-10-09 ENCOUNTER — Other Ambulatory Visit: Payer: Self-pay

## 2017-10-09 ENCOUNTER — Inpatient Hospital Stay: Payer: Medicare HMO

## 2017-10-09 ENCOUNTER — Encounter: Payer: Self-pay | Admitting: Oncology

## 2017-10-09 ENCOUNTER — Inpatient Hospital Stay (HOSPITAL_BASED_OUTPATIENT_CLINIC_OR_DEPARTMENT_OTHER): Payer: Medicare HMO | Admitting: Oncology

## 2017-10-09 VITALS — BP 153/63 | HR 51 | Temp 96.6°F | Resp 20 | Wt 221.8 lb

## 2017-10-09 DIAGNOSIS — Z8542 Personal history of malignant neoplasm of other parts of uterus: Secondary | ICD-10-CM | POA: Diagnosis not present

## 2017-10-09 DIAGNOSIS — C189 Malignant neoplasm of colon, unspecified: Secondary | ICD-10-CM | POA: Diagnosis not present

## 2017-10-09 DIAGNOSIS — Z800 Family history of malignant neoplasm of digestive organs: Secondary | ICD-10-CM | POA: Diagnosis not present

## 2017-10-09 DIAGNOSIS — Z809 Family history of malignant neoplasm, unspecified: Secondary | ICD-10-CM

## 2017-10-09 DIAGNOSIS — C187 Malignant neoplasm of sigmoid colon: Secondary | ICD-10-CM

## 2017-10-09 DIAGNOSIS — Z5111 Encounter for antineoplastic chemotherapy: Secondary | ICD-10-CM | POA: Diagnosis not present

## 2017-10-09 DIAGNOSIS — E559 Vitamin D deficiency, unspecified: Secondary | ICD-10-CM | POA: Diagnosis not present

## 2017-10-09 DIAGNOSIS — Z79899 Other long term (current) drug therapy: Secondary | ICD-10-CM

## 2017-10-09 DIAGNOSIS — R739 Hyperglycemia, unspecified: Secondary | ICD-10-CM | POA: Diagnosis not present

## 2017-10-09 DIAGNOSIS — L271 Localized skin eruption due to drugs and medicaments taken internally: Secondary | ICD-10-CM

## 2017-10-09 DIAGNOSIS — I10 Essential (primary) hypertension: Secondary | ICD-10-CM | POA: Diagnosis not present

## 2017-10-09 DIAGNOSIS — Z923 Personal history of irradiation: Secondary | ICD-10-CM

## 2017-10-09 DIAGNOSIS — Z803 Family history of malignant neoplasm of breast: Secondary | ICD-10-CM | POA: Diagnosis not present

## 2017-10-09 DIAGNOSIS — K219 Gastro-esophageal reflux disease without esophagitis: Secondary | ICD-10-CM | POA: Diagnosis not present

## 2017-10-09 LAB — CBC WITH DIFFERENTIAL/PLATELET
BASOS ABS: 0.1 10*3/uL (ref 0–0.1)
BASOS PCT: 1 %
EOS PCT: 3 %
Eosinophils Absolute: 0.2 10*3/uL (ref 0–0.7)
HCT: 35.1 % (ref 35.0–47.0)
Hemoglobin: 11.4 g/dL — ABNORMAL LOW (ref 12.0–16.0)
LYMPHS PCT: 30 %
Lymphs Abs: 1.5 10*3/uL (ref 1.0–3.6)
MCH: 29.6 pg (ref 26.0–34.0)
MCHC: 32.5 g/dL (ref 32.0–36.0)
MCV: 91.1 fL (ref 80.0–100.0)
MONO ABS: 0.5 10*3/uL (ref 0.2–0.9)
MONOS PCT: 10 %
NEUTROS ABS: 2.9 10*3/uL (ref 1.4–6.5)
Neutrophils Relative %: 56 %
PLATELETS: 169 10*3/uL (ref 150–440)
RBC: 3.85 MIL/uL (ref 3.80–5.20)
RDW: 18 % — AB (ref 11.5–14.5)
WBC: 5.2 10*3/uL (ref 3.6–11.0)

## 2017-10-09 LAB — COMPREHENSIVE METABOLIC PANEL
ALBUMIN: 3.6 g/dL (ref 3.5–5.0)
ALK PHOS: 121 U/L (ref 38–126)
ALT: 17 U/L (ref 14–54)
ANION GAP: 7 (ref 5–15)
AST: 26 U/L (ref 15–41)
BILIRUBIN TOTAL: 0.7 mg/dL (ref 0.3–1.2)
BUN: 18 mg/dL (ref 6–20)
CALCIUM: 9.1 mg/dL (ref 8.9–10.3)
CO2: 22 mmol/L (ref 22–32)
Chloride: 108 mmol/L (ref 101–111)
Creatinine, Ser: 0.98 mg/dL (ref 0.44–1.00)
GFR calc Af Amer: >60 mL/min (ref >60)
GFR calc non Af Amer: 55 mL/min — ABNORMAL LOW (ref >60)
GLUCOSE: 160 mg/dL — AB (ref 65–99)
Potassium: 3.8 mmol/L (ref 3.5–5.1)
SODIUM: 137 mmol/L (ref 135–145)
TOTAL PROTEIN: 6.6 g/dL (ref 6.5–8.1)

## 2017-10-09 MED ORDER — LEUCOVORIN CALCIUM INJECTION 100 MG: 20.0000 mg/m2 | Freq: Once | INTRAMUSCULAR | Status: DC

## 2017-10-09 MED ORDER — SODIUM CHLORIDE 0.9% FLUSH
10.0000 mL | INTRAVENOUS | Status: DC | PRN
  Filled 2017-10-09: qty 10

## 2017-10-09 MED ORDER — PALONOSETRON HCL INJECTION 0.25 MG/5ML
0.2500 mg | Freq: Once | INTRAVENOUS | Status: AC
  Administered 2017-10-09: 0.25 mg via INTRAVENOUS
  Filled 2017-10-09: qty 5

## 2017-10-09 MED ORDER — HEPARIN SOD (PORK) LOCK FLUSH 100 UNIT/ML IV SOLN: 500.0000 [IU] | Freq: Once | INTRAVENOUS | Status: DC | PRN

## 2017-10-09 MED ORDER — LEUCOVORIN CALCIUM INJECTION 350 MG
400.0000 mg/m2 | Freq: Once | INTRAMUSCULAR | Status: DC
Start: 2017-10-09 — End: 2017-10-09

## 2017-10-09 MED ORDER — FLUOROURACIL CHEMO INJECTION 2.5 GM/50ML
400.0000 mg/m2 | Freq: Once | INTRAVENOUS | Status: AC
  Administered 2017-10-09: 850 mg via INTRAVENOUS
  Filled 2017-10-09: qty 17

## 2017-10-09 MED ORDER — SODIUM CHLORIDE 0.9 % IV SOLN
2400.0000 mg/m2 | INTRAVENOUS | Status: DC
  Administered 2017-10-09: 5200 mg via INTRAVENOUS
  Filled 2017-10-09: qty 104

## 2017-10-09 MED ORDER — LEUCOVORIN CALCIUM INJECTION 100 MG
42.0000 mg | Freq: Once | INTRAMUSCULAR | Status: AC
  Administered 2017-10-09: 42 mg via INTRAVENOUS
  Filled 2017-10-09: qty 2.1

## 2017-10-09 NOTE — Progress Notes (Signed)
Patient here today for follow up and treatment consideration regarding colon cancer. Patient reports she has developed watery eyes since last treatment.

## 2017-10-09 NOTE — Progress Notes (Signed)
Hematology/Oncology Follow Up visit. [place] Telephone:([phone]) [phone] [phone]) [phone]  CONSULT NOTE Patient Care Team: Burnard Hawthorne, FNP as PCP - General (Family Medicine) Vladimir Crofts, MD (Neurology) Noreene Filbert, MD as Referring Physician (Radiation Oncology)  PURPOSE OF CONSULTATION:  Follow up for treatment of Colon cancer   HISTORY OF PRESENTING ILLNESS:  [name] 76 y.o.  female with PMH listed as below was referred to me for evaluation and management of colon cancer.  She had colonoscopy evaluation due to positive cologuard.  She was found to have a nonobstructing mass approximately 20 cm from the anal verge which biopsy proved to be adenocarcinoma. CT scan showed a large mass from the sigmoid and extends beyond the wall of the colon toward the left into the surrounding fat. Preoperational CEA was normal. Patient underwent robotic sigmoidectomy on 06/07/2017.   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 MLH1  loss of function, PMS2 loss of function. Declined option of clinical trial ATOMIC She has a history of endometrial cancer s/p hysterotomy and radiation. She has a family history of colon cancer and breast cancer.   INTERVAL HISTORY 76 yo female with above history presents to evaluation prior to cycle 6 Adjuvant chemotherapy.  Patient reports 1 episode of diarrhea which resolved after taking Imodium. Reports hand skin peeling. Doing well and denies any N/V. Energy level is good.   Denies shortness of breath chest pain abdominal pain fever or chills.   Review of Systems  Constitutional: Negative for appetite change, chills and fatigue.  HENT:   Negative for hearing loss and lump/mass.   Eyes: Negative for eye problems and icterus.  Respiratory: Negative for chest tightness, cough, hemoptysis and shortness of breath.     Cardiovascular: Negative for chest pain.  Gastrointestinal: Negative for abdominal distention and abdominal pain.  Endocrine: Negative for hot flashes.  Genitourinary: Negative for difficulty urinating, dysuria and frequency.   Musculoskeletal: Negative for arthralgias and back pain.  Skin: Negative for itching.       Palm skin peeling.   Neurological: Negative for dizziness.  Hematological: Negative for adenopathy.  Psychiatric/Behavioral: The patient is not nervous/anxious.    MEDICAL HISTORY:  Past Medical History:  Diagnosis Date  . Arthritis    bitateral knees and left foot,s/p cortisone injection in past  . Chronic constipation   . Complication of anesthesia     slow to wake up   . Endometrial cancer ([place]) 2014   Total Hysterectomy and Rad tx's.   . Family history of cancer   . GERD (gastroesophageal reflux disease)   . Hypertension   . IBS (irritable bowel syndrome)   . Menopause    age 48  . PONV (postoperative nausea and vomiting)   . Vitamin D deficiency 06/07/10    SURGICAL HISTORY: Past Surgical History:  Procedure Laterality Date  . BUNIONECTOMY    . COLONOSCOPY WITH PROPOFOL N/A 04/04/2017   Procedure: COLONOSCOPY WITH PROPOFOL;  Surgeon: [name], MD;  Location: [facility] ENDOSCOPY;  Service: Endoscopy;  Laterality: N/A;  . DILATION AND CURETTAGE OF UTERUS  1971  . FOOT SURGERY    . NASAL SINUS SURGERY    . PORTACATH PLACEMENT Right 07/12/2017   Procedure: INSERTION PORT-A-CATH;  Surgeon: Jules Husbands, MD;  Location: [facility] ORS;  Service: General;  Laterality: Right;  . ROBOTIC ASSISTED LAPAROSCOPIC VENTRAL/INCISIONAL HERNIA REPAIR N/A 06/07/2017   Procedure:  ROBOTIC INCISIONAL HERNIA REPAIR;  Surgeon: Leighton Ruff, MD;  Location: [facility] ORS;  Service: General;  Laterality: N/A;  . VAGINAL DELIVERY    . VAGINAL HYSTERECTOMY      SOCIAL HISTORY: Social History   Socioeconomic History  . Marital status: Divorced    Spouse name: Not on file  . Number of  children: Not on file  . Years of education: Not on file  . Highest education level: Not on file  Social Needs  . Financial resource strain: Not on file  . Food insecurity - worry: Not on file  . Food insecurity - inability: Not on file  . Transportation needs - medical: Not on file  . Transportation needs - non-medical: Not on file  Occupational History  . Not on file  Tobacco Use  . Smoking status: Never Smoker  . Smokeless tobacco: Never Used  Substance and Sexual Activity  . Alcohol use: Yes    Comment: socially/rarely  . Drug use: No  . Sexual activity: No  Other Topics Concern  . Not on file  Social History Narrative   Exercise- aerobic exercise at home 3-4x week.       Lives in [place]. No pets.    FAMILY HISTORY: Family History  Problem Relation Age of Onset  . Heart disease Father 25       massive MI  . Diabetes Father   . Bipolar disorder Sister   . Osteoporosis Sister   . Breast cancer Sister 37       currently 49  . Bipolar disorder Maternal Aunt   . Breast cancer Maternal Aunt        age at dx unknown; deceased 91s  . Colon cancer Maternal Grandfather 60       deceased 56  . Osteoporosis Mother   . Colon cancer Mother        age at dx unknown; deceased 73  . Cancer Maternal Grandmother        unk. type; deceased 60  . Breast cancer Paternal Aunt        deceased 8    ALLERGIES:  is allergic to codeine.  MEDICATIONS:  Current Outpatient Medications  Medication Sig Dispense Refill  . chlorhexidine (PERIDEX) 0.12 % solution Use as directed 15 mLs in the mouth or throat 2 (two) times daily. 473 mL 0  . fluticasone (FLONASE) 50 MCG/ACT nasal spray Place 2 sprays into both nostrils daily. (Patient not taking: Reported on 09/25/2017) 16 g 2  . lidocaine-prilocaine (EMLA) cream Apply 1 application topically as needed. 30 g 0  . losartan (COZAAR) 50 MG tablet Take 1 tablet (50 mg total) by mouth daily. 90 tablet 3  . Magnesium 400 MG TABS Take 400 mg  by mouth daily as needed (irritable bowel syndrome).    . metoprolol succinate (TOPROL-XL) 100 MG 24 hr tablet Take 1 tablet (100 mg total) by mouth daily. 90 tablet 3  . Multiple Vitamin (MULTIVITAMIN WITH MINERALS) TABS tablet Take 1 tablet by mouth daily.    . ondansetron (ZOFRAN) 8 MG tablet Take 1 tablet (8 mg total) by mouth 2 (two) times daily as needed for refractory nausea / vomiting. Start on day 3 after chemotherapy. (Patient not taking: Reported on 09/25/2017) 30 tablet 1  . OVER THE COUNTER MEDICATION Take 1 capsule by mouth daily after breakfast. IBgard    . Polyethylene Glycol 3350 (MIRALAX PO) Take 17 g by mouth daily as needed (constipation).     . prochlorperazine (  COMPAZINE) 10 MG tablet Take 1 tablet (10 mg total) by mouth every 6 (six) hours as needed (Nausea or vomiting). (Patient not taking: Reported on 09/25/2017) 30 tablet 1  . simvastatin (ZOCOR) 20 MG tablet Take 1 tablet (20 mg total) by mouth every evening. 90 tablet 4  . topiramate (TOPAMAX) 50 MG tablet Take 1 tablet (50 mg total) by mouth 2 (two) times daily. 180 tablet 2   No current facility-administered medications for this visit.       Marland Kitchen  PHYSICAL EXAMINATION: ECOG PERFORMANCE STATUS: 0 - Asymptomatic Vitals:   10/09/17 1123  BP: (!) 153/63  Pulse: (!) 51  Resp: 20  Temp: (!) 96.6 F (35.9 C)   There were no vitals filed for this visit.  GENERAL: No distress, well nourished.  SKIN:  No rashes or significant lesions  HEAD: Normocephalic, No masses, lesions, tenderness or abnormalities  EYES: Conjunctiva are pink, non icteric ENT: External ears normal ,lips , buccal mucosa, and tongue normal and mucous membranes are moist  LYMPH: No palpable cervical and axillary lymphadenopathy  LUNGS: Clear to auscultation, no crackles or wheezes HEART: Regular rate & rhythm, no murmurs, no gallops, S1 normal and S2 normal  ABDOMEN: Abdomen soft, non-tender, normal bowel sounds, I did not appreciate any  masses  or organomegaly  MUSCULOSKELETAL: No CVA tenderness and no tenderness on percussion of the back or rib cage.  EXTREMITIES: No edema, no skin discoloration or tenderness NEURO: Alert & oriented, no focal motor/sensory deficits.   LABORATORY DATA:  I have reviewed the data as listed Lab Results  Component Value Date   WBC 5.1 09/25/2017   HGB 11.7 (L) 09/25/2017   HCT 35.7 09/25/2017   MCV 90.1 09/25/2017   PLT 197 09/25/2017   Recent Labs    08/28/17 0816 09/11/17 0808 09/25/17 0821  NA 141 140 140  K 3.6 3.8 3.6  CL 109 110 111  CO2 _0 GLUCOSE 145* 156* 146*  BUN _1 CREATININE 0.78 0.85 0.84  CALCIUM 9.1 9.3 9.2  GFRNONAA >60 >60 >60  GFRAA >60 >60 >60  PROT 6.6 6.7 6.6  ALBUMIN 3.5 3.5 3.5  AST _2 ALT _3 ALKPHOS 132* 133* 136*  BILITOT 0.5 0.9 0.6    RADIOGRAPHIC STUDIES: I have personally reviewed the radiological images as listed and agreed with the findings in the report. 04/12/2017 CT abdomen pelvis w contrast Large mass arising from the sigmoid colon. This mass extends beyond the wall of the colon toward the left into the surrounding fat. This lesion does not reach the left pelvic sidewall.No adenopathy or liver lesions evident. No omental lesions appreciable. Sizable midline ventral hernia slightly superior to theumbilicus containing fat and a loop of colon without bowel compromise. Much smaller midline umbilical ventral hernia containingonly fat. There is a small hiatal hernia. No bowel obstruction.  No abscess.  Appendix region appears normal. No renal or ureteral calculus.  No hydronephrosis.There is aortoiliac atherosclerosis. There are foci of coronary artery calcification.   ASSESSMENT & PLAN:  76 y.o.female who has Stage IIIB colon cancer.  Cancer Staging Colon cancer [facility]) Staging form: Colon and Rectum, AJCC 8th Edition - Clinical stage from 07/06/2017: Stage IIIB (cT4a, cN1c, cM0) - Unsigned  1. Malignant neoplasm of  sigmoid colon ([place])   2. Family history of cancer   3. Encounter for antineoplastic chemotherapy   4. Palmar plantar erythrodysesthesia     #  Proceed cycle 6 single agent 5-FU  today.  #  T4 tumor,will set up appointment with RadOnc after she finsihes adjuvant chemother    # Neuropathy resolved. Continue Vitamin B6 supplementation.   # Hyperglycemia: HbA1C 5.9, follow up with primary care physician #  Significant personal history, somatic MLH1  loss of function, PMS2 loss of function.  family history suspecting germline mutation, ?lynch syndrome.   referred to genetic testing.  # Palmar erythrodysesthesia: educated patient that this could be from 5 FU toxicity. Advice her to apply utter cream on both palm and bottom of feet.   All questions were answered. The patient knows to call the clinic with any problems questions or concerns. Return of visit: She will see me in 2 weeks prior to cycle 5  adjuvant 5-FU, cbc, cmp prior to treatment  Earlie Server, MD, PhD Hematology Oncology [facility] Pager- [phone] 10/09/2017

## 2017-10-11 ENCOUNTER — Inpatient Hospital Stay: Payer: Medicare HMO

## 2017-10-11 VITALS — BP 146/72 | HR 55 | Temp 97.6°F | Resp 20

## 2017-10-11 DIAGNOSIS — K219 Gastro-esophageal reflux disease without esophagitis: Secondary | ICD-10-CM | POA: Diagnosis not present

## 2017-10-11 DIAGNOSIS — Z923 Personal history of irradiation: Secondary | ICD-10-CM | POA: Diagnosis not present

## 2017-10-11 DIAGNOSIS — Z8542 Personal history of malignant neoplasm of other parts of uterus: Secondary | ICD-10-CM | POA: Diagnosis not present

## 2017-10-11 DIAGNOSIS — Z5111 Encounter for antineoplastic chemotherapy: Secondary | ICD-10-CM | POA: Diagnosis not present

## 2017-10-11 DIAGNOSIS — Z79899 Other long term (current) drug therapy: Secondary | ICD-10-CM | POA: Diagnosis not present

## 2017-10-11 DIAGNOSIS — C187 Malignant neoplasm of sigmoid colon: Secondary | ICD-10-CM | POA: Diagnosis not present

## 2017-10-11 DIAGNOSIS — R739 Hyperglycemia, unspecified: Secondary | ICD-10-CM | POA: Diagnosis not present

## 2017-10-11 DIAGNOSIS — I10 Essential (primary) hypertension: Secondary | ICD-10-CM | POA: Diagnosis not present

## 2017-10-11 DIAGNOSIS — E559 Vitamin D deficiency, unspecified: Secondary | ICD-10-CM | POA: Diagnosis not present

## 2017-10-11 MED ORDER — SODIUM CHLORIDE 0.9% FLUSH
10.0000 mL | INTRAVENOUS | Status: DC | PRN
  Administered 2017-10-11: 10 mL
  Filled 2017-10-11: qty 10

## 2017-10-11 MED ORDER — HEPARIN SOD (PORK) LOCK FLUSH 100 UNIT/ML IV SOLN
500.0000 [IU] | Freq: Once | INTRAVENOUS | Status: AC | PRN
  Administered 2017-10-11: 500 [IU]
  Filled 2017-10-11: qty 5

## 2017-10-12 ENCOUNTER — Other Ambulatory Visit: Payer: Self-pay | Admitting: Family

## 2017-10-12 DIAGNOSIS — Z1231 Encounter for screening mammogram for malignant neoplasm of breast: Secondary | ICD-10-CM

## 2017-10-20 ENCOUNTER — Encounter: Payer: Self-pay | Admitting: Genetic Counselor

## 2017-10-20 ENCOUNTER — Telehealth: Payer: Self-pay | Admitting: Genetic Counselor

## 2017-10-20 NOTE — Telephone Encounter (Signed)
Cancer Genetics             Telegenetics Results Disclosure   Patient Name: [name] Patient DOB: 10/12/1941 Patient Age: 76 y.o. Phone Call Date: 10/20/2017  Referring Provider: Earlie Server, MD    [name] was called today to discuss genetic test results. Please see the Genetics telephone note from 09/26/2017 for a detailed discussion of her personal and family histories and the recommendations provided.  Genetic Testing: At the time of [name]'s telegenetics visit, she decided to pursue genetic testing of multiple genes associated with hereditary susceptibility to cancer. Testing included sequencing and deletion/duplication analysis. Testing did not reveal any pathogenic mutation in any of these genes.  A copy of the genetic test report will be scanned into Epic under the media tab.  The genes analyzed were the 83 genes on [facility]'s Multi-Cancer panel (ALK, APC, ATM, AXIN2, BAP1, BARD1, BLM, BMPR1A, BRCA1, BRCA2, BRIP1, CASR, CDC73, CDH1, CDK4, CDKN1B, CDKN1C, CDKN2A, CEBPA, CHEK2, CTNNA1, DICER1, DIS3L2, EGFR, EPCAM, FH, FLCN, GATA2, GPC3, GREM1, HOXB13, HRAS, KIT, MAX, MEN1, MET, MITF, MLH1, MSH2, MSH3, MSH6, MUTYH, NBN, NF1, NF2, NTHL1, PALB2, PDGFRA, PHOX2B, PMS2, POLD1, POLE, POT1, PRKAR1A, PTCH1, PTEN, RAD50, RAD51C, RAD51D, RB1, RECQL4, RET, RUNX1, SDHA, SDHAF2, SDHB, SDHC, SDHD, SMAD4, SMARCA4, SMARCB1, SMARCE1, STK11, SUFU, TERC, TERT, TMEM127, TP53, TSC1, TSC2, VHL, WRN, WT1).  Since the current test is not perfect, it is possible that there may be a gene mutation that current testing cannot detect, but that chance is small. It is possible that a different genetic factor, which has not yet been discovered or is not on this panel, is responsible for the cancer diagnoses in the family. Again, the likelihood of this is low. No additional testing is recommended at this time for [name].  A Variant of Uncertain Significance was detected: ALK  [id number] ([id number]),. This is still considered a normal result. While at this time, it is unknown if this finding is associated with increased cancer risk, the majority of these variants get reclassified to be inconsequential. We emphasized that medical management should not be based on this finding. With time, we suspect the lab will determine the significance, if any. If we do learn more about it, we will try to contact [name] to discuss it further. It is important to stay in touch with [place] periodically and keep the address and phone number up to date.  Cancer Screening: [name] is recommended to follow the cancer screening guidelines provided by her physicians.   Family Members: Family members are at some increased risk of developing cancer, over the general population risk, simply due to the family history. Women are recommended to have a yearly mammogram beginning at age 59, a yearly clinical breast exam, a yearly gynecologic exam and perform monthly breast self-exams. Colon cancer screening is recommended to begin by age 85 in both men and women, unless there is a family history of colon cancer or colon polyps or an individual has a personal history to warrant initiating screening at a younger age.  Any relative who had cancer at a young age or had a particularly rare cancer may also wish to pursue genetic testing. Genetic counselors can be located in other cities, by visiting the website of the [employer] ([employer]ist[employer]se) and Field seismologist for a Dietitian by zip code.   Family  members are not recommended to get tested for the above VUS outside of a research protocol as this finding has no implications for their medical management.    Lastly, cancer genetics is a rapidly advancing field and it is possible that new genetic tests will be appropriate for [name] in the future. We encourage [name] to remain in contact with Genetics on an annual basis so  her personal and family histories can be updated.    Steele Berg, MS, [place] Certified Genetic Counselor phone: [phone]

## 2017-10-23 ENCOUNTER — Inpatient Hospital Stay: Payer: Medicare HMO

## 2017-10-23 ENCOUNTER — Inpatient Hospital Stay: Payer: Medicare HMO | Admitting: Oncology

## 2017-10-23 DIAGNOSIS — Z5111 Encounter for antineoplastic chemotherapy: Secondary | ICD-10-CM | POA: Insufficient documentation

## 2017-10-23 DIAGNOSIS — Z8542 Personal history of malignant neoplasm of other parts of uterus: Secondary | ICD-10-CM | POA: Insufficient documentation

## 2017-10-23 DIAGNOSIS — C187 Malignant neoplasm of sigmoid colon: Secondary | ICD-10-CM | POA: Insufficient documentation

## 2017-10-23 DIAGNOSIS — I10 Essential (primary) hypertension: Secondary | ICD-10-CM | POA: Insufficient documentation

## 2017-10-23 DIAGNOSIS — E559 Vitamin D deficiency, unspecified: Secondary | ICD-10-CM | POA: Insufficient documentation

## 2017-10-23 DIAGNOSIS — D492 Neoplasm of unspecified behavior of bone, soft tissue, and skin: Secondary | ICD-10-CM | POA: Insufficient documentation

## 2017-10-23 DIAGNOSIS — Z79899 Other long term (current) drug therapy: Secondary | ICD-10-CM | POA: Insufficient documentation

## 2017-10-23 DIAGNOSIS — L271 Localized skin eruption due to drugs and medicaments taken internally: Secondary | ICD-10-CM | POA: Insufficient documentation

## 2017-10-23 DIAGNOSIS — C189 Malignant neoplasm of colon, unspecified: Secondary | ICD-10-CM | POA: Diagnosis not present

## 2017-10-23 DIAGNOSIS — K589 Irritable bowel syndrome without diarrhea: Secondary | ICD-10-CM | POA: Insufficient documentation

## 2017-10-23 DIAGNOSIS — Z923 Personal history of irradiation: Secondary | ICD-10-CM | POA: Insufficient documentation

## 2017-10-23 DIAGNOSIS — K219 Gastro-esophageal reflux disease without esophagitis: Secondary | ICD-10-CM | POA: Insufficient documentation

## 2017-10-23 DIAGNOSIS — K5909 Other constipation: Secondary | ICD-10-CM | POA: Insufficient documentation

## 2017-10-24 ENCOUNTER — Encounter: Payer: Self-pay | Admitting: Oncology

## 2017-10-24 ENCOUNTER — Inpatient Hospital Stay: Payer: Medicare HMO | Attending: Oncology | Admitting: Oncology

## 2017-10-24 ENCOUNTER — Inpatient Hospital Stay: Payer: Medicare HMO

## 2017-10-24 ENCOUNTER — Other Ambulatory Visit: Payer: Self-pay

## 2017-10-24 VITALS — BP 127/77 | HR 67 | Temp 96.7°F | Resp 18 | Wt 217.8 lb

## 2017-10-24 DIAGNOSIS — Z5111 Encounter for antineoplastic chemotherapy: Secondary | ICD-10-CM

## 2017-10-24 DIAGNOSIS — Z803 Family history of malignant neoplasm of breast: Secondary | ICD-10-CM

## 2017-10-24 DIAGNOSIS — C187 Malignant neoplasm of sigmoid colon: Secondary | ICD-10-CM

## 2017-10-24 DIAGNOSIS — Z8542 Personal history of malignant neoplasm of other parts of uterus: Secondary | ICD-10-CM

## 2017-10-24 DIAGNOSIS — L271 Localized skin eruption due to drugs and medicaments taken internally: Secondary | ICD-10-CM

## 2017-10-24 DIAGNOSIS — I10 Essential (primary) hypertension: Secondary | ICD-10-CM | POA: Diagnosis not present

## 2017-10-24 DIAGNOSIS — Z809 Family history of malignant neoplasm, unspecified: Secondary | ICD-10-CM

## 2017-10-24 DIAGNOSIS — K219 Gastro-esophageal reflux disease without esophagitis: Secondary | ICD-10-CM | POA: Diagnosis not present

## 2017-10-24 DIAGNOSIS — Z923 Personal history of irradiation: Secondary | ICD-10-CM | POA: Diagnosis not present

## 2017-10-24 DIAGNOSIS — K589 Irritable bowel syndrome without diarrhea: Secondary | ICD-10-CM | POA: Diagnosis not present

## 2017-10-24 DIAGNOSIS — Z79899 Other long term (current) drug therapy: Secondary | ICD-10-CM | POA: Diagnosis not present

## 2017-10-24 DIAGNOSIS — C189 Malignant neoplasm of colon, unspecified: Secondary | ICD-10-CM

## 2017-10-24 DIAGNOSIS — K5909 Other constipation: Secondary | ICD-10-CM | POA: Diagnosis not present

## 2017-10-24 DIAGNOSIS — D492 Neoplasm of unspecified behavior of bone, soft tissue, and skin: Secondary | ICD-10-CM | POA: Diagnosis not present

## 2017-10-24 DIAGNOSIS — Z800 Family history of malignant neoplasm of digestive organs: Secondary | ICD-10-CM

## 2017-10-24 DIAGNOSIS — E559 Vitamin D deficiency, unspecified: Secondary | ICD-10-CM | POA: Diagnosis not present

## 2017-10-24 LAB — CBC WITH DIFFERENTIAL/PLATELET
Basophils Absolute: 0 10*3/uL (ref 0–0.1)
Basophils Relative: 1 %
Eosinophils Absolute: 0.1 10*3/uL (ref 0–0.7)
Eosinophils Relative: 3 %
HEMATOCRIT: 35.5 % (ref 35.0–47.0)
Hemoglobin: 11.6 g/dL — ABNORMAL LOW (ref 12.0–16.0)
LYMPHS ABS: 1.2 10*3/uL (ref 1.0–3.6)
LYMPHS PCT: 29 %
MCH: 29.7 pg (ref 26.0–34.0)
MCHC: 32.5 g/dL (ref 32.0–36.0)
MCV: 91.4 fL (ref 80.0–100.0)
MONO ABS: 0.5 10*3/uL (ref 0.2–0.9)
MONOS PCT: 11 %
NEUTROS ABS: 2.3 10*3/uL (ref 1.4–6.5)
Neutrophils Relative %: 56 %
Platelets: 179 10*3/uL (ref 150–440)
RBC: 3.89 MIL/uL (ref 3.80–5.20)
RDW: 19 % — AB (ref 11.5–14.5)
WBC: 4.2 10*3/uL (ref 3.6–11.0)

## 2017-10-24 LAB — COMPREHENSIVE METABOLIC PANEL
ALBUMIN: 3.7 g/dL (ref 3.5–5.0)
ALT: 19 U/L (ref 14–54)
ANION GAP: 8 (ref 5–15)
AST: 27 U/L (ref 15–41)
Alkaline Phosphatase: 125 U/L (ref 38–126)
BUN: 17 mg/dL (ref 6–20)
CO2: 22 mmol/L (ref 22–32)
Calcium: 9.2 mg/dL (ref 8.9–10.3)
Chloride: 108 mmol/L (ref 101–111)
Creatinine, Ser: 1.01 mg/dL — ABNORMAL HIGH (ref 0.44–1.00)
GFR calc Af Amer: >60 mL/min (ref >60)
GFR calc non Af Amer: 53 mL/min — ABNORMAL LOW (ref >60)
GLUCOSE: 116 mg/dL — AB (ref 65–99)
POTASSIUM: 4.1 mmol/L (ref 3.5–5.1)
SODIUM: 138 mmol/L (ref 135–145)
Total Bilirubin: 0.8 mg/dL (ref 0.3–1.2)
Total Protein: 6.9 g/dL (ref 6.5–8.1)

## 2017-10-24 MED ORDER — DEXTROSE 5 % IV SOLN: 400.0000 mg/m2 | Freq: Once | INTRAVENOUS | Status: DC

## 2017-10-24 MED ORDER — SODIUM CHLORIDE 0.9 % IV SOLN
INTRAVENOUS | Status: DC
  Administered 2017-10-24: 15:00:00 via INTRAVENOUS
  Filled 2017-10-24: qty 1000

## 2017-10-24 MED ORDER — PALONOSETRON HCL INJECTION 0.25 MG/5ML
0.2500 mg | Freq: Once | INTRAVENOUS | Status: AC
  Administered 2017-10-24: 0.25 mg via INTRAVENOUS
  Filled 2017-10-24: qty 5

## 2017-10-24 MED ORDER — FLUOROURACIL CHEMO INJECTION 2.5 GM/50ML
320.0000 mg/m2 | Freq: Once | INTRAVENOUS | Status: AC
  Administered 2017-10-24: 700 mg via INTRAVENOUS
  Filled 2017-10-24: qty 14

## 2017-10-24 MED ORDER — SODIUM CHLORIDE 0.9 % IV SOLN
1920.0000 mg/m2 | INTRAVENOUS | Status: DC
  Administered 2017-10-24: 4150 mg via INTRAVENOUS
  Filled 2017-10-24: qty 83

## 2017-10-24 MED ORDER — LEUCOVORIN CALCIUM INJECTION 100 MG
20.0000 mg/m2 | Freq: Once | INTRAMUSCULAR | Status: AC
  Administered 2017-10-24: 42 mg via INTRAVENOUS
  Filled 2017-10-24: qty 2.1

## 2017-10-24 NOTE — Progress Notes (Signed)
Here for follow up. Stated skin on R hand "peeling " putting cream on, noted that hair thinning, and c/o dry mouth

## 2017-10-24 NOTE — Progress Notes (Signed)
Hematology/Oncology Follow Up visit. [place] [phone] [phone]) [phone]  CONSULT NOTE Patient Care Team: Burnard Hawthorne, FNP as PCP - General (Family Medicine) Vladimir Crofts, MD (Neurology) Noreene Filbert, MD as Referring Physician (Radiation Oncology)  PURPOSE OF CONSULTATION:  Follow up for treatment of Colon cancer   HISTORY OF PRESENTING ILLNESS:  [name] 76 y.o.  female with PMH listed as below was referred to me for evaluation and management of colon cancer.  She had colonoscopy evaluation due to positive cologuard.  She was found to have a nonobstructing mass approximately 20 cm from the anal verge which biopsy proved to be adenocarcinoma. CT scan showed a large mass from the sigmoid and extends beyond the wall of the colon toward the left into the surrounding fat. Preoperational CEA was normal. Patient underwent robotic sigmoidectomy on 06/07/2017.   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 MLH1  loss of function, PMS2 loss of function. Declined option of clinical trial ATOMIC She has a history of endometrial cancer s/p hysterotomy and radiation. She has a family history of colon cancer and breast cancer.   INTERVAL HISTORY 76 yo female with above history presents to evaluation prior to cycle 7 single agent 5-FU adjuvant chemotherapy.  Patient reports 3 episodes of diarrhea the day before and resolved after taking imodium. Denies any nausea vomiting. Feeling well.  Her left hand has mild peeling on the thrumb. Right hand has several area of skin peeling. No swelling. Not tender.    Denies shortness of breath chest pain abdominal pain fever or chills.   Review of Systems  Constitutional: Negative for appetite change, chills and fatigue.  HENT:   Negative for hearing loss and lump/mass.   Eyes: Negative for eye problems  and icterus.  Respiratory: Negative for chest tightness, cough, hemoptysis and shortness of breath.   Cardiovascular: Negative for chest pain.  Gastrointestinal: Negative for abdominal distention and abdominal pain.  Endocrine: Negative for hot flashes.  Genitourinary: Negative for difficulty urinating, dysuria and frequency.   Musculoskeletal: Negative for arthralgias and back pain.  Skin: Negative for itching.       Left thumb skin peeling.  Right hand palm skin peeling.  Neurological: Negative for dizziness.  Hematological: Negative for adenopathy.  Psychiatric/Behavioral: The patient is not nervous/anxious.    MEDICAL HISTORY:  Past Medical History:  Diagnosis Date  . Arthritis    bitateral knees and left foot,s/p cortisone injection in past  . Chronic constipation   . Complication of anesthesia     slow to wake up   . Endometrial cancer ([place]) 2014   Total Hysterectomy and Rad tx's.   . Family history of cancer   . Genetic testing 09/26/2017    Multi-Cancer panel (83 genes) @ [facility] - No pathogenic mutations detected  . GERD (gastroesophageal reflux disease)   . Hypertension   . IBS (irritable bowel syndrome)   . Menopause    age 61  . PONV (postoperative nausea and vomiting)   . Vitamin D deficiency 06/07/10    SURGICAL HISTORY: Past Surgical History:  Procedure Laterality Date  . BUNIONECTOMY    . COLONOSCOPY WITH PROPOFOL N/A 04/04/2017   Procedure: COLONOSCOPY WITH PROPOFOL;  Surgeon: [name], MD;  Location: [facility] ENDOSCOPY;  Service: Endoscopy;  Laterality: N/A;  . DILATION AND CURETTAGE OF UTERUS  1971  . FOOT SURGERY    .  NASAL SINUS SURGERY    . PORTACATH PLACEMENT Right 07/12/2017   Procedure: INSERTION PORT-A-CATH;  Surgeon: Jules Husbands, MD;  Location: [facility] ORS;  Service: General;  Laterality: Right;  . ROBOTIC ASSISTED LAPAROSCOPIC VENTRAL/INCISIONAL HERNIA REPAIR N/A 06/07/2017   Procedure: ROBOTIC INCISIONAL HERNIA REPAIR;  Surgeon: Leighton Ruff,  MD;  Location: [facility] ORS;  Service: General;  Laterality: N/A;  . VAGINAL DELIVERY    . VAGINAL HYSTERECTOMY      SOCIAL HISTORY: Social History   Socioeconomic History  . Marital status: Divorced    Spouse name: Not on file  . Number of children: Not on file  . Years of education: Not on file  . Highest education level: Not on file  Social Needs  . Financial resource strain: Not on file  . Food insecurity - worry: Not on file  . Food insecurity - inability: Not on file  . Transportation needs - medical: Not on file  . Transportation needs - non-medical: Not on file  Occupational History  . Not on file  Tobacco Use  . Smoking status: Never Smoker  . Smokeless tobacco: Never Used  Substance and Sexual Activity  . Alcohol use: Yes    Comment: socially/rarely  . Drug use: No  . Sexual activity: No  Other Topics Concern  . Not on file  Social History Narrative   Exercise- aerobic exercise at home 3-4x week.       Lives in [place]. No pets.    FAMILY HISTORY: Family History  Problem Relation Age of Onset  . Heart disease Father 61       massive MI  . Diabetes Father   . Bipolar disorder Sister   . Osteoporosis Sister   . Breast cancer Sister 4       currently 47  . Bipolar disorder Maternal Aunt   . Breast cancer Maternal Aunt        age at dx unknown; deceased 58s  . Colon cancer Maternal Grandfather 12       deceased 68  . Osteoporosis Mother   . Colon cancer Mother        age at dx unknown; deceased 81  . Cancer Maternal Grandmother        unk. type; deceased 78  . Breast cancer Paternal Aunt        deceased 67    ALLERGIES:  is allergic to codeine.  MEDICATIONS:  Current Outpatient Medications  Medication Sig Dispense Refill  . chlorhexidine (PERIDEX) 0.12 % solution Use as directed 15 mLs in the mouth or throat 2 (two) times daily. 473 mL 0  . lidocaine-prilocaine (EMLA) cream Apply 1 application topically as needed. 30 g 0  . losartan (COZAAR) 50  MG tablet Take 1 tablet (50 mg total) by mouth daily. 90 tablet 3  . metoprolol succinate (TOPROL-XL) 100 MG 24 hr tablet Take 1 tablet (100 mg total) by mouth daily. 90 tablet 3  . Multiple Vitamin (MULTIVITAMIN WITH MINERALS) TABS tablet Take 1 tablet by mouth daily.    . simvastatin (ZOCOR) 20 MG tablet Take 1 tablet (20 mg total) by mouth every evening. 90 tablet 4  . topiramate (TOPAMAX) 50 MG tablet Take 1 tablet (50 mg total) by mouth 2 (two) times daily. 180 tablet 2  . fluticasone (FLONASE) 50 MCG/ACT nasal spray Place 2 sprays into both nostrils daily. (Patient not taking: Reported on 09/25/2017) 16 g 2  . Magnesium 400 MG TABS Take 400 mg by mouth daily  as needed (irritable bowel syndrome).    . ondansetron (ZOFRAN) 8 MG tablet Take 1 tablet (8 mg total) by mouth 2 (two) times daily as needed for refractory nausea / vomiting. Start on day 3 after chemotherapy. (Patient not taking: Reported on 09/25/2017) 30 tablet 1  . Polyethylene Glycol 3350 (MIRALAX PO) Take 17 g by mouth daily as needed (constipation).     . prochlorperazine (COMPAZINE) 10 MG tablet Take 1 tablet (10 mg total) by mouth every 6 (six) hours as needed (Nausea or vomiting). (Patient not taking: Reported on 09/25/2017) 30 tablet 1   No current facility-administered medications for this visit.       Marland Kitchen  PHYSICAL EXAMINATION: ECOG PERFORMANCE STATUS: 1 - Symptomatic but completely ambulatory Vitals:   10/24/17 1334  BP: 127/77  Pulse: 67  Resp: 18  Temp: (!) 96.7 F (35.9 C)   Filed Weights   10/24/17 1334  Weight: 217 lb 12.8 oz (98.8 kg)     LABORATORY DATA:  I have reviewed the data as listed Lab Results  Component Value Date   WBC 4.2 10/24/2017   HGB 11.6 (L) 10/24/2017   HCT 35.5 10/24/2017   MCV 91.4 10/24/2017   PLT 179 10/24/2017   Recent Labs    09/25/17 0821 10/09/17 1024 10/24/17 1318  NA 140 137 138  K 3.6 3.8 4.1  CL 111 108 108  CO2 '22 22 22  ' GLUCOSE 146* 160* 116*  BUN '17 18  17  ' CREATININE 0.84 0.98 1.01*  CALCIUM 9.2 9.1 9.2  GFRNONAA >60 55* 53*  GFRAA >60 >60 >60  PROT 6.6 6.6 6.9  ALBUMIN 3.5 3.6 3.7  AST '28 26 27  ' ALT '18 17 19  ' ALKPHOS 136* 121 125  BILITOT 0.6 0.7 0.8    RADIOGRAPHIC STUDIES: I have personally reviewed the radiological images as listed and agreed with the findings in the report. 04/12/2017 CT abdomen pelvis w contrast Large mass arising from the sigmoid colon. This mass extends beyond the wall of the colon toward the left into the surrounding fat. This lesion does not reach the left pelvic sidewall.No adenopathy or liver lesions evident. No omental lesions appreciable. Sizable midline ventral hernia slightly superior to theumbilicus containing fat and a loop of colon without bowel compromise. Much smaller midline umbilical ventral hernia containingonly fat. There is a small hiatal hernia. No bowel obstruction.  No abscess.  Appendix region appears normal. No renal or ureteral calculus.  No hydronephrosis.There is aortoiliac atherosclerosis. There are foci of coronary artery calcification.  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   ASSESSMENT & PLAN:  76 y.o.female who has Stage IIIB colon cancer.  Cancer Staging Colon cancer [facility]) Staging form: Colon and Rectum, AJCC 8th Edition - Clinical stage from 07/06/2017: Stage IIIB (cT4a, cN1c, cM0) - Unsigned  1. Malignant neoplasm of sigmoid colon (HCC)   2. Palmar plantar erythrodysesthesia   3. Encounter for antineoplastic chemotherapy   4. Family history of cancer   5. Family history of breast cancer   6. Family history of colon cancer     #  Proceed cycle 7 5-FU adjuvant chemotherapy with dose reduce to 80% due to mild hand and foot syndrome.  #  .  #  T4 tumor,will set up appointment with RadOnc after she finsihes adjuvant chemother    #  Palmar plantar erythrodysesthesia: reduce 5-FU to 80%. Mild hand and foot syndrome: advise patient to continue using utter cream several times a  day to keep  skin moisturized # Mild diarrhea, resolved. Continue imodium PRN.  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 Results have been explained to patient by genetic counselor.    All questions were answered. The patient knows to call the clinic with any problems questions or concerns. Return of visit: She will see me in 2 weeks prior to cycle 8  adjuvant 5-FU, cbc, cmp prior to treatment  Earlie Server, MD, PhD Hematology Oncology [facility] Pager- [phone] 10/24/2017

## 2017-10-25 ENCOUNTER — Inpatient Hospital Stay: Payer: Medicare HMO

## 2017-10-26 ENCOUNTER — Inpatient Hospital Stay: Payer: Medicare HMO

## 2017-10-26 VITALS — BP 136/72 | HR 59 | Resp 20

## 2017-10-26 DIAGNOSIS — Z8542 Personal history of malignant neoplasm of other parts of uterus: Secondary | ICD-10-CM | POA: Diagnosis not present

## 2017-10-26 DIAGNOSIS — Z79899 Other long term (current) drug therapy: Secondary | ICD-10-CM | POA: Diagnosis not present

## 2017-10-26 DIAGNOSIS — K219 Gastro-esophageal reflux disease without esophagitis: Secondary | ICD-10-CM | POA: Diagnosis not present

## 2017-10-26 DIAGNOSIS — L271 Localized skin eruption due to drugs and medicaments taken internally: Secondary | ICD-10-CM | POA: Diagnosis not present

## 2017-10-26 DIAGNOSIS — D492 Neoplasm of unspecified behavior of bone, soft tissue, and skin: Secondary | ICD-10-CM | POA: Diagnosis not present

## 2017-10-26 DIAGNOSIS — I10 Essential (primary) hypertension: Secondary | ICD-10-CM | POA: Diagnosis not present

## 2017-10-26 DIAGNOSIS — C187 Malignant neoplasm of sigmoid colon: Secondary | ICD-10-CM | POA: Diagnosis not present

## 2017-10-26 DIAGNOSIS — Z5111 Encounter for antineoplastic chemotherapy: Secondary | ICD-10-CM | POA: Diagnosis not present

## 2017-10-26 DIAGNOSIS — Z923 Personal history of irradiation: Secondary | ICD-10-CM | POA: Diagnosis not present

## 2017-10-26 MED ORDER — HEPARIN SOD (PORK) LOCK FLUSH 100 UNIT/ML IV SOLN
500.0000 [IU] | Freq: Once | INTRAVENOUS | Status: AC | PRN
  Administered 2017-10-26: 500 [IU]
  Filled 2017-10-26: qty 5

## 2017-10-26 MED ORDER — SODIUM CHLORIDE 0.9% FLUSH
10.0000 mL | INTRAVENOUS | Status: DC | PRN
  Administered 2017-10-26: 10 mL
  Filled 2017-10-26: qty 10

## 2017-11-02 ENCOUNTER — Encounter: Payer: Self-pay | Admitting: Oncology

## 2017-11-06 ENCOUNTER — Other Ambulatory Visit: Payer: Self-pay

## 2017-11-06 ENCOUNTER — Ambulatory Visit: Payer: Medicare HMO | Admitting: Urgent Care

## 2017-11-06 ENCOUNTER — Inpatient Hospital Stay: Payer: Medicare HMO

## 2017-11-06 ENCOUNTER — Encounter: Payer: Self-pay | Admitting: Oncology

## 2017-11-06 ENCOUNTER — Inpatient Hospital Stay (HOSPITAL_BASED_OUTPATIENT_CLINIC_OR_DEPARTMENT_OTHER): Payer: Medicare HMO | Admitting: Oncology

## 2017-11-06 VITALS — BP 123/75 | HR 61 | Temp 94.2°F | Resp 18 | Wt 217.9 lb

## 2017-11-06 DIAGNOSIS — Z8542 Personal history of malignant neoplasm of other parts of uterus: Secondary | ICD-10-CM

## 2017-11-06 DIAGNOSIS — Z79899 Other long term (current) drug therapy: Secondary | ICD-10-CM | POA: Diagnosis not present

## 2017-11-06 DIAGNOSIS — Z923 Personal history of irradiation: Secondary | ICD-10-CM

## 2017-11-06 DIAGNOSIS — I10 Essential (primary) hypertension: Secondary | ICD-10-CM | POA: Diagnosis not present

## 2017-11-06 DIAGNOSIS — C187 Malignant neoplasm of sigmoid colon: Secondary | ICD-10-CM | POA: Diagnosis not present

## 2017-11-06 DIAGNOSIS — Z5111 Encounter for antineoplastic chemotherapy: Secondary | ICD-10-CM

## 2017-11-06 DIAGNOSIS — L271 Localized skin eruption due to drugs and medicaments taken internally: Secondary | ICD-10-CM

## 2017-11-06 DIAGNOSIS — D492 Neoplasm of unspecified behavior of bone, soft tissue, and skin: Secondary | ICD-10-CM

## 2017-11-06 DIAGNOSIS — K219 Gastro-esophageal reflux disease without esophagitis: Secondary | ICD-10-CM | POA: Diagnosis not present

## 2017-11-06 LAB — CBC WITH DIFFERENTIAL/PLATELET
BASOS ABS: 0 10*3/uL (ref 0–0.1)
Basophils Relative: 1 %
Eosinophils Absolute: 0.2 10*3/uL (ref 0–0.7)
Eosinophils Relative: 4 %
HEMATOCRIT: 35.7 % (ref 35.0–47.0)
HEMOGLOBIN: 11.7 g/dL — AB (ref 12.0–16.0)
LYMPHS PCT: 33 %
Lymphs Abs: 1.6 10*3/uL (ref 1.0–3.6)
MCH: 30.5 pg (ref 26.0–34.0)
MCHC: 32.8 g/dL (ref 32.0–36.0)
MCV: 92.9 fL (ref 80.0–100.0)
MONO ABS: 0.5 10*3/uL (ref 0.2–0.9)
Monocytes Relative: 11 %
NEUTROS ABS: 2.4 10*3/uL (ref 1.4–6.5)
NEUTROS PCT: 51 %
Platelets: 210 10*3/uL (ref 150–440)
RBC: 3.85 MIL/uL (ref 3.80–5.20)
RDW: 18.9 % — ABNORMAL HIGH (ref 11.5–14.5)
WBC: 4.8 10*3/uL (ref 3.6–11.0)

## 2017-11-06 LAB — COMPREHENSIVE METABOLIC PANEL
ALBUMIN: 3.6 g/dL (ref 3.5–5.0)
ALT: 15 U/L (ref 14–54)
ANION GAP: 4 — AB (ref 5–15)
AST: 30 U/L (ref 15–41)
Alkaline Phosphatase: 122 U/L (ref 38–126)
BILIRUBIN TOTAL: 0.7 mg/dL (ref 0.3–1.2)
BUN: 18 mg/dL (ref 6–20)
CO2: 22 mmol/L (ref 22–32)
Calcium: 8.9 mg/dL (ref 8.9–10.3)
Chloride: 110 mmol/L (ref 101–111)
Creatinine, Ser: 0.97 mg/dL (ref 0.44–1.00)
GFR calc non Af Amer: 55 mL/min — ABNORMAL LOW (ref >60)
GLUCOSE: 152 mg/dL — AB (ref 65–99)
POTASSIUM: 4 mmol/L (ref 3.5–5.1)
Sodium: 136 mmol/L (ref 135–145)
TOTAL PROTEIN: 7 g/dL (ref 6.5–8.1)

## 2017-11-06 MED ORDER — FLUOROURACIL CHEMO INJECTION 2.5 GM/50ML
320.0000 mg/m2 | Freq: Once | INTRAVENOUS | Status: AC
  Administered 2017-11-06: 700 mg via INTRAVENOUS
  Filled 2017-11-06: qty 14

## 2017-11-06 MED ORDER — PALONOSETRON HCL INJECTION 0.25 MG/5ML
0.2500 mg | Freq: Once | INTRAVENOUS | Status: AC
  Administered 2017-11-06: 0.25 mg via INTRAVENOUS
  Filled 2017-11-06: qty 5

## 2017-11-06 MED ORDER — SODIUM CHLORIDE 0.9 % IV SOLN
INTRAVENOUS | Status: DC
  Administered 2017-11-06: 09:00:00 via INTRAVENOUS
  Filled 2017-11-06: qty 1000

## 2017-11-06 MED ORDER — LEUCOVORIN CALCIUM INJECTION 100 MG
20.0000 mg/m2 | Freq: Once | INTRAMUSCULAR | Status: AC
  Administered 2017-11-06: 42 mg via INTRAVENOUS
  Filled 2017-11-06: qty 2.1

## 2017-11-06 MED ORDER — LEUCOVORIN CALCIUM INJECTION 350 MG: 400.0000 mg/m2 | Freq: Once | INTRAVENOUS | Status: DC

## 2017-11-06 MED ORDER — SODIUM CHLORIDE 0.9 % IV SOLN
1920.0000 mg/m2 | INTRAVENOUS | Status: DC
  Administered 2017-11-06: 4150 mg via INTRAVENOUS
  Filled 2017-11-06: qty 83

## 2017-11-06 NOTE — Progress Notes (Signed)
Here for follow up. Doing well she stated  

## 2017-11-06 NOTE — Progress Notes (Signed)
Hematology/Oncology Follow Up visit. [place] Telephone:(336) [phone] Fax:(336) [phone]  CONSULT NOTE Patient Care Team: Burnard Hawthorne, FNP as PCP - General (Family Medicine) Vladimir Crofts, MD (Neurology) Noreene Filbert, MD as Referring Physician (Radiation Oncology)  PURPOSE OF CONSULTATION:  Follow up for treatment of Colon cancer  HISTORY OF PRESENTING ILLNESS:  [name] 76 y.o.  female with PMH listed as below was referred to me for evaluation and management of colon cancer.  She had colonoscopy evaluation due to positive cologuard.  She was found to have a nonobstructing mass approximately 20 cm from the anal verge which biopsy proved to be adenocarcinoma. CT scan showed a large mass from the sigmoid and extends beyond the wall of the colon toward the left into the surrounding fat. Preoperational CEA was normal. Patient underwent robotic sigmoidectomy on 06/07/2017.   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 MLH1  loss of function, PMS2 loss of function. Declined option of clinical trial ATOMIC She has a history of endometrial cancer s/p hysterotomy and radiation. She has a family history of colon cancer and breast cancer.  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 Results have been explained to patient by genetic counselor.    INTERVAL HISTORY 76 yo female with above history presents to evaluation prior to cycle 8 single agent 5-FU adjuvant chemotherapy. 5-FU was reduced to 80% dose due to mild hand-foot syndrome. Her left hand remains not affected by hand-foot syndrome. Right thumb, index and middle finge she continue to have some peeling. Patient reports soreness, but not painful or swelling and does not bother her. She uses other cream and keep the moisture.  Denies shortness of breath chest pain abdominal pain fever or chills.   Review of Systems  Constitutional: Negative for appetite change, chills, diaphoresis and fatigue.  HENT:   Negative for hearing loss, lump/mass and nosebleeds.   Eyes: Negative for eye problems and icterus.  Respiratory: Negative for chest tightness, cough, hemoptysis and shortness of breath.   Cardiovascular: Negative for chest pain and palpitations.  Gastrointestinal: Negative for abdominal distention, abdominal pain and blood in stool.  Endocrine: Negative for hot flashes.  Genitourinary: Negative for difficulty urinating, dysuria, frequency and nocturia.   Musculoskeletal: Negative for arthralgias, back pain and flank pain.  Skin: Negative for itching and rash.       Left thumb skin peeling.  Right hand palm skin peeling.  Neurological: Negative for dizziness and headaches.  Hematological: Negative for adenopathy.  Psychiatric/Behavioral: Negative for confusion. The patient is not nervous/anxious.    MEDICAL HISTORY:  Past Medical History:  Diagnosis Date  . Arthritis    bitateral knees and left foot,s/p cortisone injection in past  . Chronic constipation   . Complication of anesthesia     slow to wake up   . Endometrial cancer ([place]) 2014   Total Hysterectomy and Rad tx's.   . Family history of cancer   . Genetic testing 09/26/2017    Multi-Cancer panel (83 genes) @ [facility] - No pathogenic mutations detected  . GERD (gastroesophageal reflux disease)   . Hypertension   . IBS (irritable bowel syndrome)   . Menopause    age 26  . PONV (postoperative nausea and vomiting)   . Vitamin D deficiency 06/07/10    SURGICAL HISTORY: Past Surgical  History:  Procedure Laterality Date  . BUNIONECTOMY    . COLONOSCOPY WITH PROPOFOL N/A 04/04/2017   Procedure: COLONOSCOPY WITH PROPOFOL;  Surgeon: [name], MD;  Location: [facility] ENDOSCOPY;  Service: Endoscopy;  Laterality: N/A;  . DILATION AND CURETTAGE OF  UTERUS  1971  . FOOT SURGERY    . NASAL SINUS SURGERY    . PORTACATH PLACEMENT Right 07/12/2017   Procedure: INSERTION PORT-A-CATH;  Surgeon: Jules Husbands, MD;  Location: [facility] ORS;  Service: General;  Laterality: Right;  . ROBOTIC ASSISTED LAPAROSCOPIC VENTRAL/INCISIONAL HERNIA REPAIR N/A 06/07/2017   Procedure: ROBOTIC INCISIONAL HERNIA REPAIR;  Surgeon: Leighton Ruff, MD;  Location: [facility] ORS;  Service: General;  Laterality: N/A;  . VAGINAL DELIVERY    . VAGINAL HYSTERECTOMY      SOCIAL HISTORY: Social History   Socioeconomic History  . Marital status: Divorced    Spouse name: Not on file  . Number of children: Not on file  . Years of education: Not on file  . Highest education level: Not on file  Social Needs  . Financial resource strain: Not on file  . Food insecurity - worry: Not on file  . Food insecurity - inability: Not on file  . Transportation needs - medical: Not on file  . Transportation needs - non-medical: Not on file  Occupational History  . Not on file  Tobacco Use  . Smoking status: Never Smoker  . Smokeless tobacco: Never Used  Substance and Sexual Activity  . Alcohol use: Yes    Comment: socially/rarely  . Drug use: No  . Sexual activity: No  Other Topics Concern  . Not on file  Social History Narrative   Exercise- aerobic exercise at home 3-4x week.       Lives in [place]. No pets.    FAMILY HISTORY: Family History  Problem Relation Age of Onset  . Heart disease Father 77       massive MI  . Diabetes Father   . Bipolar disorder Sister   . Osteoporosis Sister   . Breast cancer Sister 67       currently 63  . Bipolar disorder Maternal Aunt   . Breast cancer Maternal Aunt        age at dx unknown; deceased 41s  . Colon cancer Maternal Grandfather 30       deceased 89  . Osteoporosis Mother   . Colon cancer Mother        age at dx unknown; deceased 63  . Cancer Maternal Grandmother        unk. type; deceased 33  . Breast cancer Paternal  Aunt        deceased 60    ALLERGIES:  is allergic to codeine.  MEDICATIONS:  Current Outpatient Medications  Medication Sig Dispense Refill  . chlorhexidine (PERIDEX) 0.12 % solution Use as directed 15 mLs in the mouth or throat 2 (two) times daily. 473 mL 0  . fluticasone (FLONASE) 50 MCG/ACT nasal spray Place 2 sprays into both nostrils daily. 16 g 2  . lidocaine-prilocaine (EMLA) cream Apply 1 application topically as needed. 30 g 0  . losartan (COZAAR) 50 MG tablet Take 1 tablet (50 mg total) by mouth daily. 90 tablet 3  . Magnesium 400 MG TABS Take 400 mg by mouth daily as needed (irritable bowel syndrome).    . metoprolol succinate (TOPROL-XL) 100 MG 24 hr tablet Take 1 tablet (100 mg total) by mouth daily. 90 tablet 3  . Multiple  Vitamin (MULTIVITAMIN WITH MINERALS) TABS tablet Take 1 tablet by mouth daily.    . simvastatin (ZOCOR) 20 MG tablet Take 1 tablet (20 mg total) by mouth every evening. 90 tablet 4  . topiramate (TOPAMAX) 50 MG tablet Take 1 tablet (50 mg total) by mouth 2 (two) times daily. 180 tablet 2  . ondansetron (ZOFRAN) 8 MG tablet Take 1 tablet (8 mg total) by mouth 2 (two) times daily as needed for refractory nausea / vomiting. Start on day 3 after chemotherapy. (Patient not taking: Reported on 09/25/2017) 30 tablet 1  . Polyethylene Glycol 3350 (MIRALAX PO) Take 17 g by mouth daily as needed (constipation).     . prochlorperazine (COMPAZINE) 10 MG tablet Take 1 tablet (10 mg total) by mouth every 6 (six) hours as needed (Nausea or vomiting). (Patient not taking: Reported on 09/25/2017) 30 tablet 1   No current facility-administered medications for this visit.    Facility-Administered Medications Ordered in Other Visits  Medication Dose Route Frequency Provider Last Rate Last Dose  . 0.9 %  sodium chloride infusion   Intravenous Continuous Earlie Server, MD   Stopped at 11/06/17 0945  . fluorouracil (ADRUCIL) 4,150 mg in sodium chloride 0.9 % 67 mL chemo infusion   1,920 mg/m2 (Treatment Plan Recorded) Intravenous 1 day or 1 dose Earlie Server, MD   4,150 mg at 11/06/17 0945      .  PHYSICAL EXAMINATION: ECOG PERFORMANCE STATUS: 1 - Symptomatic but completely ambulatory Vitals:   11/06/17 0826  BP: 123/75  Pulse: 61  Resp: 18  Temp: (!) 94.2 F (34.6 C)   Filed Weights   11/06/17 0826  Weight: 217 lb 14.4 oz (98.8 kg)     LABORATORY DATA:  I have reviewed the data as listed Lab Results  Component Value Date   WBC 4.8 11/06/2017   HGB 11.7 (L) 11/06/2017   HCT 35.7 11/06/2017   MCV 92.9 11/06/2017   PLT 210 11/06/2017   Recent Labs    10/09/17 1024 10/24/17 1318 11/06/17 0806  NA 137 138 136  K 3.8 4.1 4.0  CL 108 108 110  CO2 _0 GLUCOSE 160* 116* 152*  BUN _1 CREATININE 0.98 1.01* 0.97  CALCIUM 9.1 9.2 8.9  GFRNONAA 55* 53* 55*  GFRAA >60 >60 >60  PROT 6.6 6.9 7.0  ALBUMIN 3.6 3.7 3.6  AST _2 ALT _3 ALKPHOS 121 125 122  BILITOT 0.7 0.8 0.7    RADIOGRAPHIC STUDIES: I have personally reviewed the radiological images as listed and agreed with the findings in the report. 04/12/2017 CT abdomen pelvis w contrast Large mass arising from the sigmoid colon. This mass extends beyond the wall of the colon toward the left into the surrounding fat. This lesion does not reach the left pelvic sidewall.No adenopathy or liver lesions evident. No omental lesions appreciable. Sizable midline ventral hernia slightly superior to theumbilicus containing fat and a loop of colon without bowel compromise. Much smaller midline umbilical ventral hernia containingonly fat. There is a small hiatal hernia. No bowel obstruction.  No abscess.  Appendix region appears normal. No renal or ureteral calculus.  No hydronephrosis.There is aortoiliac atherosclerosis. There are foci of coronary artery calcification.  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   ASSESSMENT & PLAN:  76 y.o.female who has Stage IIIB colon cancer.  Cancer  Staging Colon cancer [facility]) Staging form: Colon and Rectum, AJCC 8th Edition - Clinical stage from  07/06/2017: Stage IIIB (cT4a, cN1c, cM0) - Unsigned  1. Encounter for antineoplastic chemotherapy   2. Malignant neoplasm of sigmoid colon (HCC)   3. Palmar plantar erythrodysesthesia     #  Proceed cycle 8 5-FU adjuvant chemotherapy, continue with  reduce to 80% due to hand and foot syndrome.  #  T4 tumor,will set up appointment with RadOnc after she finsihes adjuvant chemother    #  Palmar plantar erythrodysesthesia: reduce 5-FU to 80%. Mild hand and foot syndrome: Co. # Mild diarrhea, resolved. Continue imodium PRN.   All questions were answered. The patient knows to call the clinic with any problems questions or concerns. Return of visit: She will see me in 2 weeks prior to cycle 9 adjuvant 5-FU, cbc, cmp prior to treatment  Earlie Server, MD, PhD Hematology Oncology [facility] Pager- [phone] 11/06/2017

## 2017-11-08 ENCOUNTER — Inpatient Hospital Stay: Payer: Medicare HMO

## 2017-11-08 DIAGNOSIS — L271 Localized skin eruption due to drugs and medicaments taken internally: Secondary | ICD-10-CM | POA: Diagnosis not present

## 2017-11-08 DIAGNOSIS — I10 Essential (primary) hypertension: Secondary | ICD-10-CM | POA: Diagnosis not present

## 2017-11-08 DIAGNOSIS — Z923 Personal history of irradiation: Secondary | ICD-10-CM | POA: Diagnosis not present

## 2017-11-08 DIAGNOSIS — C187 Malignant neoplasm of sigmoid colon: Secondary | ICD-10-CM

## 2017-11-08 DIAGNOSIS — Z5111 Encounter for antineoplastic chemotherapy: Secondary | ICD-10-CM | POA: Diagnosis not present

## 2017-11-08 DIAGNOSIS — K219 Gastro-esophageal reflux disease without esophagitis: Secondary | ICD-10-CM | POA: Diagnosis not present

## 2017-11-08 DIAGNOSIS — Z79899 Other long term (current) drug therapy: Secondary | ICD-10-CM | POA: Diagnosis not present

## 2017-11-08 DIAGNOSIS — D492 Neoplasm of unspecified behavior of bone, soft tissue, and skin: Secondary | ICD-10-CM | POA: Diagnosis not present

## 2017-11-08 DIAGNOSIS — Z8542 Personal history of malignant neoplasm of other parts of uterus: Secondary | ICD-10-CM | POA: Diagnosis not present

## 2017-11-08 MED ORDER — HEPARIN SOD (PORK) LOCK FLUSH 100 UNIT/ML IV SOLN
500.0000 [IU] | Freq: Once | INTRAVENOUS | Status: AC | PRN
  Administered 2017-11-08: 500 [IU]
  Filled 2017-11-08: qty 5

## 2017-11-08 MED ORDER — SODIUM CHLORIDE 0.9% FLUSH
10.0000 mL | INTRAVENOUS | Status: DC | PRN
  Administered 2017-11-08: 10 mL
  Filled 2017-11-08: qty 10

## 2017-11-09 ENCOUNTER — Ambulatory Visit
Admission: RE | Admit: 2017-11-09 | Discharge: 2017-11-09 | Disposition: A | Discharge status: Home / Self Care | Payer: Medicare HMO | Source: Ambulatory Visit | Attending: Family | Admitting: Family

## 2017-11-09 DIAGNOSIS — Z1231 Encounter for screening mammogram for malignant neoplasm of breast: Secondary | ICD-10-CM | POA: Diagnosis not present

## 2017-11-09 HISTORY — DX: Malignant neoplasm of colon, unspecified: C18.9

## 2017-11-15 ENCOUNTER — Ambulatory Visit (INDEPENDENT_AMBULATORY_CARE_PROVIDER_SITE_OTHER): Payer: Medicare HMO

## 2017-11-15 VITALS — BP 120/70 | HR 61 | Temp 97.9°F | Resp 16 | Ht 65.0 in | Wt 215.0 lb

## 2017-11-15 DIAGNOSIS — Z0000 Encounter for general adult medical examination without abnormal findings: Secondary | ICD-10-CM

## 2017-11-15 DIAGNOSIS — Z1331 Encounter for screening for depression: Secondary | ICD-10-CM

## 2017-11-15 NOTE — Progress Notes (Addendum)
Subjective:   [name] is a 77 y.o. female who presents for Medicare Annual (Subsequent) preventive examination.  Review of Systems:  No ROS.  Medicare Wellness Visit. Additional risk factors are reflected in the social history.  Cardiac Risk Factors include: advanced age (>39men, >74 women);hypertension     Objective:     Vitals: BP 120/70 (BP Location: Left Arm, Patient Position: Sitting, Cuff Size: Normal)   Pulse 61   Temp 97.9 F (36.6 C) (Oral)   Resp 16   Ht 5\' 5"  (1.651 m)   Wt 215 lb (97.5 kg)   SpO2 97%   BMI 35.78 kg/m   Body mass index is 35.78 kg/m.  Advanced Directives 11/15/2017 11/06/2017 10/24/2017 10/09/2017 09/25/2017 09/13/2017 09/11/2017  Does Patient Have a Medical Advance Directive? Yes Yes Yes Yes Yes Yes Yes  Type of Advance Directive - Living will;Healthcare Power of [place];Living will [place];Living will [place];Living will [place];Living will Living will;Healthcare Power of Attorney  Does patient want to make changes to medical advance directive? No - Patient declined - - - - - -  Copy of [place] in Chart? No - copy requested No - copy requested No - copy requested - No - copy requested No - copy requested -    Tobacco Social History   Tobacco Use  Smoking Status Never Smoker  Smokeless Tobacco Never Used     Counseling given: Not Answered   Clinical Intake:  Pre-visit preparation completed: Yes  Pain : No/denies pain     Diabetes: No  How often do you need to have someone help you when you read instructions, pamphlets, or other written materials from your doctor or pharmacy?: 1 - Never  Interpreter Needed?: No     Past Medical History:  Diagnosis Date  . Arthritis    bitateral knees and left foot,s/p cortisone injection in past  . Chronic constipation   . Colon cancer ([place]) 2018   pt is undergoing chemo  11-09-17  . Complication of anesthesia     slow to wake up   . Endometrial cancer ([place]) 2014   Total Hysterectomy and Rad tx's.   . Family history of cancer   . Genetic testing 09/26/2017    Multi-Cancer panel (83 genes) @ [facility] - No pathogenic mutations detected  . GERD (gastroesophageal reflux disease)   . Hypertension   . IBS (irritable bowel syndrome)   . Menopause    age 66  . PONV (postoperative nausea and vomiting)   . Vitamin D deficiency 06/07/10   Past Surgical History:  Procedure Laterality Date  . BUNIONECTOMY    . COLONOSCOPY WITH PROPOFOL N/A 04/04/2017   Procedure: COLONOSCOPY WITH PROPOFOL;  Surgeon: [name], MD;  Location: [facility] ENDOSCOPY;  Service: Endoscopy;  Laterality: N/A;  . DILATION AND CURETTAGE OF UTERUS  1971  . FOOT SURGERY    . NASAL SINUS SURGERY    . PORTACATH PLACEMENT Right 07/12/2017   Procedure: INSERTION PORT-A-CATH;  Surgeon: Jules Husbands, MD;  Location: [facility] ORS;  Service: General;  Laterality: Right;  . ROBOTIC ASSISTED LAPAROSCOPIC VENTRAL/INCISIONAL HERNIA REPAIR N/A 06/07/2017   Procedure: ROBOTIC INCISIONAL HERNIA REPAIR;  Surgeon: Leighton Ruff, MD;  Location: [facility] ORS;  Service: General;  Laterality: N/A;  . VAGINAL DELIVERY    . VAGINAL HYSTERECTOMY     Family History  Problem Relation Age of Onset  . Heart disease Father  33       massive MI  . Diabetes Father   . Bipolar disorder Sister   . Osteoporosis Sister   . Breast cancer Sister 43       currently 45  . Cancer Sister        breast  . Bipolar disorder Maternal Aunt   . Breast cancer Maternal Aunt        age at dx unknown; deceased 12s  . Colon cancer Maternal Grandfather 37       deceased 61  . Cancer Maternal Grandfather        breast  . Osteoporosis Mother   . Colon cancer Mother        age at dx unknown; deceased 91  . Cancer Maternal Grandmother        unk. type; deceased 26  . Breast cancer Paternal Aunt        deceased 9   Social History    Socioeconomic History  . Marital status: Divorced    Spouse name: None  . Number of children: None  . Years of education: None  . Highest education level: None  Social Needs  . Financial resource strain: None  . Food insecurity - worry: None  . Food insecurity - inability: None  . Transportation needs - medical: None  . Transportation needs - non-medical: None  Occupational History  . None  Tobacco Use  . Smoking status: Never Smoker  . Smokeless tobacco: Never Used  Substance and Sexual Activity  . Alcohol use: Yes    Comment: socially/rarely  . Drug use: No  . Sexual activity: No  Other Topics Concern  . None  Social History Narrative   Exercise- aerobic exercise at home 3-4x week.       Lives in [place]. No pets.    Outpatient Encounter Medications as of 11/15/2017  Medication Sig  . chlorhexidine (PERIDEX) 0.12 % solution Use as directed 15 mLs in the mouth or throat 2 (two) times daily.  . fluticasone (FLONASE) 50 MCG/ACT nasal spray Place 2 sprays into both nostrils daily.  Marland Kitchen lidocaine-prilocaine (EMLA) cream Apply 1 application topically as needed.  Marland Kitchen losartan (COZAAR) 50 MG tablet Take 1 tablet (50 mg total) by mouth daily.  . Magnesium 400 MG TABS Take 400 mg by mouth daily as needed (irritable bowel syndrome).  . metoprolol succinate (TOPROL-XL) 100 MG 24 hr tablet Take 1 tablet (100 mg total) by mouth daily.  . Multiple Vitamin (MULTIVITAMIN WITH MINERALS) TABS tablet Take 1 tablet by mouth daily.  . ondansetron (ZOFRAN) 8 MG tablet Take 1 tablet (8 mg total) by mouth 2 (two) times daily as needed for refractory nausea / vomiting. Start on day 3 after chemotherapy.  . Polyethylene Glycol 3350 (MIRALAX PO) Take 17 g by mouth daily as needed (constipation).   . prochlorperazine (COMPAZINE) 10 MG tablet Take 1 tablet (10 mg total) by mouth every 6 (six) hours as needed (Nausea or vomiting).  . simvastatin (ZOCOR) 20 MG tablet Take 1 tablet (20 mg total) by mouth  every evening.  . topiramate (TOPAMAX) 50 MG tablet Take 1 tablet (50 mg total) by mouth 2 (two) times daily.   Facility-Administered Encounter Medications as of 11/15/2017  Medication  . sodium chloride flush (NS) 0.9 % injection 10 mL    Activities of Daily Living In your present state of health, do you have any difficulty performing the following activities: 11/15/2017 06/07/2017  Hearing? Y Y  Comment - slight  Vision? N N  Comment - glasses   Difficulty concentrating or making decisions? N N  Walking or climbing stairs? N Y  Comment - occasionally   Dressing or bathing? N N  Doing errands, shopping? N N  Preparing Food and eating ? N -  Using the Toilet? N -  In the past six months, have you accidently leaked urine? N -  Do you have problems with loss of bowel control? N -  Managing your Medications? N -  Managing your Finances? N -  Housekeeping or managing your Housekeeping? N -  Some recent data might be hidden    Patient Care Team: Burnard Hawthorne, FNP as PCP - General (Family Medicine) Vladimir Crofts, MD (Neurology) Noreene Filbert, MD as Referring Physician (Radiation Oncology)    Assessment:   This is a routine wellness examination for [place]. The goal of the wellness visit is to assist the patient how to close the gaps in care and create a preventative care plan for the patient.   The roster of all physicians providing medical care to patient is listed in the Snapshot section of the chart.  Osteoporosis risk reviewed.    Safety issues reviewed; Life alert, smoke and carbon monoxide detectors in the home. No firearms in the home. Wears seatbelts when driving or riding with others. Patient does wear sunscreen or protective clothing when in direct sunlight. No violence in the home.  Depression- PHQ 2 &9 complete.  No signs/symptoms or verbal communication regarding little pleasure in doing things, feeling down, depressed or hopeless. No changes in sleeping,  energy, eating, concentrating.  No thoughts of self harm or harm towards others.  Time spent on this topic is 10 minutes.   Patient is alert, normal appearance, oriented to person/place/and time. Correctly identified the president of the [place], recall of 2/3 words, and performing simple calculations. Displays appropriate judgement and can read correct time from watch face.   No new identified risk were noted.  No failures at ADL's or IADL's.    BMI- discussed the importance of a healthy diet, water intake and the benefits of aerobic exercise. Educational material provided.   24 hour diet recall: Breakfast: cereal, fruit Lunch: Ensure Dinner: eggs Snack: cottage cheese  Daily fluid intake: 0 cups of caffeine, 8 cups of water  Dental- every 6 months.  Eye- Visual acuity not assessed per patient preference since they have regular follow up with the ophthalmologist.  Wears corrective lenses.  Sleep patterns- Sleeps 7 hours at night.  Wakes feeling rested.  Health maintenance gaps- closed.  Patient Concerns: None at this time. Follow up with PCP as needed.  Exercise Activities and Dietary recommendations Current Exercise Habits: The patient does not participate in regular exercise at present  Goals    . Increase physical activity     Walk for exercise as tolerated    . Weight < 200 lb (90.719 kg)       Fall Risk Fall Risk  11/15/2017 10/09/2017 09/25/2017 09/11/2017 08/21/2017  Falls in the past year? No No No No No  Number falls in past yr: - - - - -  Injury with Fall? - - - - -  Follow up - - - - -   Depression Screen PHQ 2/9 Scores 11/15/2017 02/14/2017 11/15/2016 08/16/2016  PHQ - 2 Score 1 0 0 0  PHQ- 9 Score 2 - - -     Cognitive Function MMSE - Mini Mental State Exam 11/15/2017 04/15/2015  Orientation to time 5 5  Orientation to Place 5 5  Registration 3 3  Attention/ Calculation 5 5  Recall 2 3  Language- name 2 objects 2 2  Language- repeat 1 1  Language- follow 3  step command 3 3  Language- read & follow direction 1 1  Language-read & follow direction-comments - Patient loves to read.  Write a sentence 1 1  Copy design 1 1  Total score 29 30     6CIT Screen 11/15/2016  What Year? 0 points  What month? 0 points  What time? 0 points  Count back from 20 0 points  Months in reverse 0 points  Repeat phrase 0 points  Total Score 0    Immunization History  Administered Date(s) Administered  . Pneumococcal Conjugate-13 04/15/2015  . Pneumococcal Polysaccharide-23 07/25/2013  . Tdap 08/17/2016  . Zoster 07/27/2011    Screening Tests Health Maintenance  Topic Date Due  . INFLUENZA VACCINE  11/15/2017 (Originally 06/14/2017)  . TETANUS/TDAP  08/17/2026  . DEXA SCAN  Completed  . PNA vac Low Risk Adult  Completed      Plan:    End of life planning; Advance aging; Advanced directives discussed. Copy of current HCPOA/Living Will requested.    I have personally reviewed and noted the following in the patient's chart:   . Medical and social history . Use of alcohol, tobacco or illicit drugs  . Current medications and supplements . Functional ability and status . Nutritional status . Physical activity . Advanced directives . List of other physicians . Hospitalizations, surgeries, and ER visits in previous 12 months . Vitals . Screenings to include cognitive, depression, and falls . Referrals and appointments  In addition, I have reviewed and discussed with patient certain preventive protocols, quality metrics, and best practice recommendations. A written personalized care plan for preventive services as well as general preventive health recommendations were provided to patient.     OBrien-Blaney, Denisa L, LPN  11/17/4816   I have reviewed the above information and agree with above.   Deborra Medina, MD

## 2017-11-15 NOTE — Patient Instructions (Addendum)
  [name] , Thank you for taking time to come for your Medicare Wellness Visit. I appreciate your ongoing commitment to your health goals. Please review the following plan we discussed and let me know if I can assist you in the future.   Follow up with [name] FNP as needed.    Bring a copy of your [place] and/or Living Will to be scanned into chart.  Have a great day!  These are the goals we discussed: Goals    . Increase physical activity     Walk for exercise as tolerated    . Weight < 200 lb (90.719 kg)       This is a list of the screening recommended for you and due dates:  Health Maintenance  Topic Date Due  . Flu Shot  11/15/2017*  . Tetanus Vaccine  08/17/2026  . DEXA scan (bone density measurement)  Completed  . Pneumonia vaccines  Completed  *Topic was postponed. The date shown is not the original due date.

## 2017-11-19 NOTE — Progress Notes (Signed)
Hematology/Oncology Follow Up visit. [place] Telephone:(336) [phone] [phone]) [phone]  CONSULT NOTE Patient Care Team: Burnard Hawthorne, FNP as PCP - General (Family Medicine) Vladimir Crofts, MD (Neurology) Noreene Filbert, MD as Referring Physician (Radiation Oncology)  PURPOSE OF CONSULTATION:  Follow up for treatment of Colon cancer  HISTORY OF PRESENTING ILLNESS:  [name] 77 y.o.  female with PMH listed as below was referred to me for evaluation and management of colon cancer.  She had colonoscopy evaluation due to positive cologuard.  She was found to have a nonobstructing mass approximately 20 cm from the anal verge which biopsy proved to be adenocarcinoma. CT scan showed a large mass from the sigmoid and extends beyond the wall of the colon toward the left into the surrounding fat. Preoperational CEA was normal. Patient underwent robotic sigmoidectomy on 06/07/2017.   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 MLH1  loss of function, PMS2 loss of function. Declined option of clinical trial ATOMIC She has a history of endometrial cancer s/p hysterotomy and radiation. She has a family history of colon cancer and breast cancer.  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 Results have been explained to patient by genetic counselor.    INTERVAL HISTORY 77 yo female with above history presents to evaluation prior to cycle 9 single agent 5-FU adjuvant chemotherapy. 5-FU was reduced to 80% dose due to mild hand-foot syndrome. Patient reports doing well. Denies any diarrhea. She continues to have hand-foot syndrome. Left hand remains unaffected. She continues to have some skin peeling from right fingers which causes some soreness. Denies any pain for swelling. She reports that  he is feeling doesn't bother her. hand remains not affected by hand-foot syndrome. Right thumb, index and middle finge she continue to have some peeling. Patient reports soreness, but not painful or swelling and does not bother her. She uses utter cream and keep the moisturized. She reports that her appetite is fair, not as good as previously. Addition she has lost weight She also noticed a spot on the left posterior chest wall and want to show it to me. She noticed it when she reached her back. Denies any tenderness.   Denies shortness of breath chest pain abdominal pain fever or chills.   Review of Systems  Constitutional: Negative for appetite change, chills, diaphoresis, fatigue and fever.  HENT:   Negative for hearing loss, lump/mass, mouth sores and nosebleeds.   Eyes: Negative for eye problems and icterus.  Respiratory: Negative for chest tightness, cough, hemoptysis and shortness of breath.   Cardiovascular: Negative for chest pain and palpitations.  Gastrointestinal: Negative for abdominal distention, abdominal pain and blood in stool.  Endocrine: Negative for hot flashes.  Genitourinary: Negative for difficulty urinating, dysuria, frequency and nocturia.   Musculoskeletal: Negative for arthralgias, back pain and flank pain.  Skin: Negative for itching and rash.       Right hand fingers skin peeling.  Neurological: Negative for dizziness and headaches.  Hematological: Negative for adenopathy.  Psychiatric/Behavioral: Negative for confusion. The patient is not nervous/anxious.    MEDICAL HISTORY:  Past Medical History:  Diagnosis Date  . Arthritis    bitateral knees and left foot,s/p cortisone injection in past  . Chronic constipation   . Colon cancer ([place]) 2018   pt is undergoing chemo  11-09-17  . Complication of anesthesia     slow to wake up   . Endometrial cancer ([place]) 2014   Total Hysterectomy and Rad tx's.   . Family history of cancer   . Genetic testing 09/26/2017      Multi-Cancer panel (83 genes) @ [facility] - No pathogenic mutations detected  . GERD (gastroesophageal reflux disease)   . Hypertension   . IBS (irritable bowel syndrome)   . Menopause    age 67  . PONV (postoperative nausea and vomiting)   . Vitamin D deficiency 06/07/10    SURGICAL HISTORY: Past Surgical History:  Procedure Laterality Date  . BUNIONECTOMY    . COLONOSCOPY WITH PROPOFOL N/A 04/04/2017   Procedure: COLONOSCOPY WITH PROPOFOL;  Surgeon: [name], MD;  Location: [facility] ENDOSCOPY;  Service: Endoscopy;  Laterality: N/A;  . DILATION AND CURETTAGE OF UTERUS  1971  . FOOT SURGERY    . NASAL SINUS SURGERY    . PORTACATH PLACEMENT Right 07/12/2017   Procedure: INSERTION PORT-A-CATH;  Surgeon: Jules Husbands, MD;  Location: [facility] ORS;  Service: General;  Laterality: Right;  . ROBOTIC ASSISTED LAPAROSCOPIC VENTRAL/INCISIONAL HERNIA REPAIR N/A 06/07/2017   Procedure: ROBOTIC INCISIONAL HERNIA REPAIR;  Surgeon: Leighton Ruff, MD;  Location: [facility] ORS;  Service: General;  Laterality: N/A;  . VAGINAL DELIVERY    . VAGINAL HYSTERECTOMY      SOCIAL HISTORY: Social History   Socioeconomic History  . Marital status: Divorced    Spouse name: Not on file  . Number of children: Not on file  . Years of education: Not on file  . Highest education level: Not on file  Social Needs  . Financial resource strain: Not on file  . Food insecurity - worry: Not on file  . Food insecurity - inability: Not on file  . Transportation needs - medical: Not on file  . Transportation needs - non-medical: Not on file  Occupational History  . Not on file  Tobacco Use  . Smoking status: Never Smoker  . Smokeless tobacco: Never Used  Substance and Sexual Activity  . Alcohol use: Yes    Comment: socially/rarely  . Drug use: No  . Sexual activity: No  Other Topics Concern  . Not on file  Social History Narrative   Exercise- aerobic exercise at home 3-4x week.       Lives in [place]. No pets.     FAMILY HISTORY: Family History  Problem Relation Age of Onset  . Heart disease Father 12       massive MI  . Diabetes Father   . Bipolar disorder Sister   . Osteoporosis Sister   . Breast cancer Sister 61       currently 105  . Cancer Sister        breast  . Bipolar disorder Maternal Aunt   . Breast cancer Maternal Aunt        age at dx unknown; deceased 64s  . Colon cancer Maternal Grandfather 72       deceased 15  . Cancer Maternal Grandfather        breast  . Osteoporosis Mother   . Colon cancer Mother        age at dx unknown; deceased 67  . Cancer Maternal Grandmother        unk. type; deceased 20  . Breast cancer Paternal Aunt        deceased 44    ALLERGIES:  is allergic to codeine.  MEDICATIONS:  Current Outpatient Medications  Medication Sig Dispense Refill  . chlorhexidine (PERIDEX) 0.12 % solution Use as directed 15 mLs in the mouth or throat 2 (two) times daily. 473 mL 0  . fluticasone (FLONASE) 50 MCG/ACT nasal spray Place 2 sprays into both nostrils daily. 16 g 2  . lidocaine-prilocaine (EMLA) cream Apply 1 application topically as needed. 30 g 0  . losartan (COZAAR) 50 MG tablet Take 1 tablet (50 mg total) by mouth daily. 90 tablet 3  . Magnesium 400 MG TABS Take 400 mg by mouth daily as needed (irritable bowel syndrome).    . metoprolol succinate (TOPROL-XL) 100 MG 24 hr tablet Take 1 tablet (100 mg total) by mouth daily. 90 tablet 3  . Multiple Vitamin (MULTIVITAMIN WITH MINERALS) TABS tablet Take 1 tablet by mouth daily.    . ondansetron (ZOFRAN) 8 MG tablet Take 1 tablet (8 mg total) by mouth 2 (two) times daily as needed for refractory nausea / vomiting. Start on day 3 after chemotherapy. 30 tablet 1  . Polyethylene Glycol 3350 (MIRALAX PO) Take 17 g by mouth daily as needed (constipation).     . prochlorperazine (COMPAZINE) 10 MG tablet Take 1 tablet (10 mg total) by mouth every 6 (six) hours as needed (Nausea or vomiting). 30 tablet 1  .  simvastatin (ZOCOR) 20 MG tablet Take 1 tablet (20 mg total) by mouth every evening. 90 tablet 4  . topiramate (TOPAMAX) 50 MG tablet Take 1 tablet (50 mg total) by mouth 2 (two) times daily. 180 tablet 2  . Multiple Vitamins-Minerals (ICAPS AREDS 2 PO) Take 2 capsules by mouth daily.     No current facility-administered medications for this visit.    Facility-Administered Medications Ordered in Other Visits  Medication Dose Route Frequency Provider Last Rate Last Dose  . heparin lock flush 100 unit/mL  500 Units Intravenous Once Earlie Server, MD      . sodium chloride flush (NS) 0.9 % injection 10 mL  10 mL Intracatheter PRN Earlie Server, MD   10 mL at 11/08/17 1400  . sodium chloride flush (NS) 0.9 % injection 10 mL  10 mL Intravenous PRN Earlie Server, MD   10 mL at 11/20/17 0849      .  PHYSICAL EXAMINATION: ECOG PERFORMANCE STATUS: 1 - Symptomatic but completely ambulatory Vitals:   11/20/17 0903 11/20/17 0911  BP:  111/69  Pulse:  64  Resp: 16   Temp:  (!) 97.3 F (36.3 C)   Filed Weights   11/20/17 0903  Weight: 212 lb 9.6 oz (96.4 kg)     LABORATORY DATA:  I have reviewed the data as listed Lab Results  Component Value Date   WBC 4.6 11/20/2017   HGB 12.4 11/20/2017   HCT 37.6 11/20/2017   MCV 94.5 11/20/2017   PLT 191 11/20/2017   Recent Labs    10/24/17 1318 11/06/17 0806 11/20/17 0852  NA 138 136 139  K 4.1 4.0 4.0  CL 108 110 110  CO2 22 22 21*  GLUCOSE 116* 152* 126*  BUN _0 CREATININE 1.01* 0.97 0.90  CALCIUM 9.2 8.9 9.3  GFRNONAA 53* 55* >60  GFRAA >60 >60 >60  PROT 6.9 7.0 7.4  ALBUMIN 3.7 3.6 4.0  AST _1 ALT _2 ALKPHOS 125 122 132*  BILITOT 0.8 0.7 1.0    RADIOGRAPHIC STUDIES: I have personally reviewed the radiological images as listed and agreed with the findings in the  report. 04/12/2017 CT abdomen pelvis w contrast Large mass arising from the sigmoid colon. This mass extends beyond the wall of the colon toward the left  into the surrounding fat. This lesion does not reach the left pelvic sidewall.No adenopathy or liver lesions evident. No omental lesions appreciable. Sizable midline ventral hernia slightly superior to theumbilicus containing fat and a loop of colon without bowel compromise. Much smaller midline umbilical ventral hernia containingonly fat. There is a small hiatal hernia. No bowel obstruction.  No abscess.  Appendix region appears normal. No renal or ureteral calculus.  No hydronephrosis.There is aortoiliac atherosclerosis. There are foci of coronary artery calcification.  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   ASSESSMENT & PLAN:  77 y.o.female who has Stage IIIB colon cancer.  Cancer Staging Colon cancer [facility]) Staging form: Colon and Rectum, AJCC 8th Edition - Clinical stage from 07/06/2017: Stage IIIB (cT4a, cN1c, cM0) - Unsigned  1. Malignant neoplasm of sigmoid colon ([place])   2. Encounter for antineoplastic chemotherapy   3. Palmar plantar erythrodysesthesia   4. Subcutaneous nodule of chest wall     #  Proceed Cycle 9-FU adjuvant chemotherapy, continue with  reduce to 80% due to hand and foot syndrome. she tolerates well.  #  T4 tumor,will set up appointment with RadOnc after she finsihes adjuvant chemother    #  Palmar plantar erythrodysesthesia:. 5-FU has been reduced to 80% dose. Stable. # Subcutaneous nodule posterior chest:  Mobile and Nontender, possible subcutaneous/follicular cyst. Advise her to make an appointment to see her surgeon and have excisional biopsy of it. Patient prefers to watch it for now until she finished her chemotherapy.   All questions were answered. The patient knows to call the clinic with any problems questions or concerns. Return of visit: She will see me in 2 weeks prior to cycle 10 adjuvant 5-FU, cbc, cmp prior to treatment  Earlie Server, MD, PhD Hematology Oncology [facility] Pager- [phone] 11/20/2017

## 2017-11-20 ENCOUNTER — Inpatient Hospital Stay: Payer: Medicare HMO | Attending: Oncology | Admitting: Oncology

## 2017-11-20 ENCOUNTER — Encounter: Payer: Self-pay | Admitting: Oncology

## 2017-11-20 ENCOUNTER — Inpatient Hospital Stay: Payer: Medicare HMO

## 2017-11-20 ENCOUNTER — Other Ambulatory Visit: Payer: Self-pay

## 2017-11-20 VITALS — BP 111/69 | HR 64 | Temp 97.3°F | Resp 16 | Ht 65.0 in | Wt 212.6 lb

## 2017-11-20 DIAGNOSIS — K219 Gastro-esophageal reflux disease without esophagitis: Secondary | ICD-10-CM | POA: Insufficient documentation

## 2017-11-20 DIAGNOSIS — K589 Irritable bowel syndrome without diarrhea: Secondary | ICD-10-CM | POA: Diagnosis not present

## 2017-11-20 DIAGNOSIS — C187 Malignant neoplasm of sigmoid colon: Secondary | ICD-10-CM

## 2017-11-20 DIAGNOSIS — D492 Neoplasm of unspecified behavior of bone, soft tissue, and skin: Secondary | ICD-10-CM | POA: Insufficient documentation

## 2017-11-20 DIAGNOSIS — L271 Localized skin eruption due to drugs and medicaments taken internally: Secondary | ICD-10-CM | POA: Diagnosis not present

## 2017-11-20 DIAGNOSIS — C186 Malignant neoplasm of descending colon: Secondary | ICD-10-CM | POA: Diagnosis not present

## 2017-11-20 DIAGNOSIS — R222 Localized swelling, mass and lump, trunk: Secondary | ICD-10-CM

## 2017-11-20 DIAGNOSIS — I10 Essential (primary) hypertension: Secondary | ICD-10-CM | POA: Diagnosis not present

## 2017-11-20 DIAGNOSIS — K5909 Other constipation: Secondary | ICD-10-CM | POA: Insufficient documentation

## 2017-11-20 DIAGNOSIS — Z79899 Other long term (current) drug therapy: Secondary | ICD-10-CM | POA: Insufficient documentation

## 2017-11-20 DIAGNOSIS — Z5111 Encounter for antineoplastic chemotherapy: Secondary | ICD-10-CM | POA: Diagnosis not present

## 2017-11-20 DIAGNOSIS — E559 Vitamin D deficiency, unspecified: Secondary | ICD-10-CM | POA: Insufficient documentation

## 2017-11-20 DIAGNOSIS — C189 Malignant neoplasm of colon, unspecified: Secondary | ICD-10-CM | POA: Diagnosis not present

## 2017-11-20 LAB — COMPREHENSIVE METABOLIC PANEL
ALT: 18 U/L (ref 14–54)
ANION GAP: 8 (ref 5–15)
AST: 31 U/L (ref 15–41)
Albumin: 4 g/dL (ref 3.5–5.0)
Alkaline Phosphatase: 132 U/L — ABNORMAL HIGH (ref 38–126)
BILIRUBIN TOTAL: 1 mg/dL (ref 0.3–1.2)
BUN: 14 mg/dL (ref 6–20)
CHLORIDE: 110 mmol/L (ref 101–111)
CO2: 21 mmol/L — ABNORMAL LOW (ref 22–32)
Calcium: 9.3 mg/dL (ref 8.9–10.3)
Creatinine, Ser: 0.9 mg/dL (ref 0.44–1.00)
GFR calc Af Amer: >60 mL/min (ref >60)
GFR calc non Af Amer: >60 mL/min (ref >60)
GLUCOSE: 126 mg/dL — AB (ref 65–99)
POTASSIUM: 4 mmol/L (ref 3.5–5.1)
Sodium: 139 mmol/L (ref 135–145)
TOTAL PROTEIN: 7.4 g/dL (ref 6.5–8.1)

## 2017-11-20 LAB — CBC WITH DIFFERENTIAL/PLATELET
BASOS ABS: 0 10*3/uL (ref 0–0.1)
Basophils Relative: 1 %
EOS PCT: 4 %
Eosinophils Absolute: 0.2 10*3/uL (ref 0–0.7)
HEMATOCRIT: 37.6 % (ref 35.0–47.0)
Hemoglobin: 12.4 g/dL (ref 12.0–16.0)
LYMPHS ABS: 1.6 10*3/uL (ref 1.0–3.6)
LYMPHS PCT: 35 %
MCH: 31.1 pg (ref 26.0–34.0)
MCHC: 32.9 g/dL (ref 32.0–36.0)
MCV: 94.5 fL (ref 80.0–100.0)
Monocytes Absolute: 0.4 10*3/uL (ref 0.2–0.9)
Monocytes Relative: 10 %
NEUTROS ABS: 2.3 10*3/uL (ref 1.4–6.5)
Neutrophils Relative %: 50 %
Platelets: 191 10*3/uL (ref 150–440)
RBC: 3.98 MIL/uL (ref 3.80–5.20)
RDW: 19 % — ABNORMAL HIGH (ref 11.5–14.5)
WBC: 4.6 10*3/uL (ref 3.6–11.0)

## 2017-11-20 MED ORDER — SODIUM CHLORIDE 0.9% FLUSH
10.0000 mL | INTRAVENOUS | Status: DC | PRN
  Administered 2017-11-20: 10 mL via INTRAVENOUS
  Filled 2017-11-20: qty 10

## 2017-11-20 MED ORDER — HEPARIN SOD (PORK) LOCK FLUSH 100 UNIT/ML IV SOLN: 500.0000 [IU] | Freq: Once | INTRAVENOUS | Status: DC

## 2017-11-20 MED ORDER — DEXTROSE 5 % IV SOLN
Freq: Once | INTRAVENOUS | Status: AC
  Administered 2017-11-20: 10:00:00 via INTRAVENOUS
  Filled 2017-11-20: qty 1000

## 2017-11-20 MED ORDER — LEUCOVORIN CALCIUM INJECTION 350 MG: 400.0000 mg/m2 | Freq: Once | INTRAVENOUS | Status: DC

## 2017-11-20 MED ORDER — FLUOROURACIL CHEMO INJECTION 2.5 GM/50ML
320.0000 mg/m2 | Freq: Once | INTRAVENOUS | Status: AC
  Administered 2017-11-20: 700 mg via INTRAVENOUS
  Filled 2017-11-20: qty 14

## 2017-11-20 MED ORDER — LEUCOVORIN CALCIUM INJECTION 100 MG
20.0000 mg/m2 | Freq: Once | INTRAMUSCULAR | Status: AC
  Administered 2017-11-20: 42 mg via INTRAVENOUS
  Filled 2017-11-20: qty 2.1

## 2017-11-20 MED ORDER — PALONOSETRON HCL INJECTION 0.25 MG/5ML
0.2500 mg | Freq: Once | INTRAVENOUS | Status: AC
  Administered 2017-11-20: 0.25 mg via INTRAVENOUS
  Filled 2017-11-20: qty 5

## 2017-11-20 MED ORDER — SODIUM CHLORIDE 0.9 % IV SOLN
1920.0000 mg/m2 | INTRAVENOUS | Status: DC
  Administered 2017-11-20: 4150 mg via INTRAVENOUS
  Filled 2017-11-20: qty 83

## 2017-11-20 NOTE — Progress Notes (Signed)
Patient here for follow up. No changes since her last appt. 

## 2017-11-22 ENCOUNTER — Inpatient Hospital Stay: Payer: Medicare HMO

## 2017-11-22 VITALS — BP 132/64 | HR 70 | Temp 98.8°F | Resp 18

## 2017-11-22 DIAGNOSIS — K589 Irritable bowel syndrome without diarrhea: Secondary | ICD-10-CM | POA: Diagnosis not present

## 2017-11-22 DIAGNOSIS — C186 Malignant neoplasm of descending colon: Secondary | ICD-10-CM | POA: Diagnosis not present

## 2017-11-22 DIAGNOSIS — Z5111 Encounter for antineoplastic chemotherapy: Secondary | ICD-10-CM | POA: Diagnosis not present

## 2017-11-22 DIAGNOSIS — C187 Malignant neoplasm of sigmoid colon: Secondary | ICD-10-CM

## 2017-11-22 DIAGNOSIS — K219 Gastro-esophageal reflux disease without esophagitis: Secondary | ICD-10-CM | POA: Diagnosis not present

## 2017-11-22 DIAGNOSIS — E559 Vitamin D deficiency, unspecified: Secondary | ICD-10-CM | POA: Diagnosis not present

## 2017-11-22 DIAGNOSIS — D492 Neoplasm of unspecified behavior of bone, soft tissue, and skin: Secondary | ICD-10-CM | POA: Diagnosis not present

## 2017-11-22 DIAGNOSIS — I10 Essential (primary) hypertension: Secondary | ICD-10-CM | POA: Diagnosis not present

## 2017-11-22 DIAGNOSIS — L271 Localized skin eruption due to drugs and medicaments taken internally: Secondary | ICD-10-CM | POA: Diagnosis not present

## 2017-11-22 DIAGNOSIS — R222 Localized swelling, mass and lump, trunk: Secondary | ICD-10-CM | POA: Diagnosis not present

## 2017-11-22 MED ORDER — HEPARIN SOD (PORK) LOCK FLUSH 100 UNIT/ML IV SOLN
500.0000 [IU] | Freq: Once | INTRAVENOUS | Status: AC | PRN
  Administered 2017-11-22: 500 [IU]

## 2017-11-22 MED ORDER — HEPARIN SOD (PORK) LOCK FLUSH 100 UNIT/ML IV SOLN
INTRAVENOUS | Status: AC
  Filled 2017-11-22: qty 5

## 2017-11-22 MED ORDER — SODIUM CHLORIDE 0.9% FLUSH
10.0000 mL | INTRAVENOUS | Status: DC | PRN
  Administered 2017-11-22: 10 mL
  Filled 2017-11-22: qty 10

## 2017-11-23 DIAGNOSIS — C189 Malignant neoplasm of colon, unspecified: Secondary | ICD-10-CM | POA: Diagnosis not present

## 2017-11-27 DIAGNOSIS — H353132 Nonexudative age-related macular degeneration, bilateral, intermediate dry stage: Secondary | ICD-10-CM | POA: Diagnosis not present

## 2017-12-04 ENCOUNTER — Inpatient Hospital Stay: Payer: Medicare HMO

## 2017-12-04 ENCOUNTER — Inpatient Hospital Stay (HOSPITAL_BASED_OUTPATIENT_CLINIC_OR_DEPARTMENT_OTHER): Payer: Medicare HMO | Admitting: Internal Medicine

## 2017-12-04 ENCOUNTER — Encounter: Payer: Self-pay | Admitting: Internal Medicine

## 2017-12-04 VITALS — BP 144/72 | HR 59 | Temp 97.9°F | Resp 20 | Ht 65.0 in | Wt 214.0 lb

## 2017-12-04 DIAGNOSIS — C186 Malignant neoplasm of descending colon: Secondary | ICD-10-CM

## 2017-12-04 DIAGNOSIS — K589 Irritable bowel syndrome without diarrhea: Secondary | ICD-10-CM | POA: Diagnosis not present

## 2017-12-04 DIAGNOSIS — C187 Malignant neoplasm of sigmoid colon: Secondary | ICD-10-CM

## 2017-12-04 DIAGNOSIS — I10 Essential (primary) hypertension: Secondary | ICD-10-CM | POA: Diagnosis not present

## 2017-12-04 DIAGNOSIS — Z79899 Other long term (current) drug therapy: Secondary | ICD-10-CM | POA: Diagnosis not present

## 2017-12-04 DIAGNOSIS — L271 Localized skin eruption due to drugs and medicaments taken internally: Secondary | ICD-10-CM | POA: Diagnosis not present

## 2017-12-04 DIAGNOSIS — D492 Neoplasm of unspecified behavior of bone, soft tissue, and skin: Secondary | ICD-10-CM | POA: Diagnosis not present

## 2017-12-04 DIAGNOSIS — K219 Gastro-esophageal reflux disease without esophagitis: Secondary | ICD-10-CM | POA: Diagnosis not present

## 2017-12-04 DIAGNOSIS — C189 Malignant neoplasm of colon, unspecified: Secondary | ICD-10-CM | POA: Diagnosis not present

## 2017-12-04 DIAGNOSIS — R222 Localized swelling, mass and lump, trunk: Secondary | ICD-10-CM | POA: Diagnosis not present

## 2017-12-04 DIAGNOSIS — E559 Vitamin D deficiency, unspecified: Secondary | ICD-10-CM | POA: Diagnosis not present

## 2017-12-04 DIAGNOSIS — Z5111 Encounter for antineoplastic chemotherapy: Secondary | ICD-10-CM | POA: Diagnosis not present

## 2017-12-04 LAB — CBC WITH DIFFERENTIAL/PLATELET
Basophils Absolute: 0.1 10*3/uL (ref 0–0.1)
Basophils Relative: 1 %
EOS PCT: 3 %
Eosinophils Absolute: 0.2 10*3/uL (ref 0–0.7)
HCT: 36.7 % (ref 35.0–47.0)
HEMOGLOBIN: 12.1 g/dL (ref 12.0–16.0)
LYMPHS ABS: 1.5 10*3/uL (ref 1.0–3.6)
LYMPHS PCT: 29 %
MCH: 31.4 pg (ref 26.0–34.0)
MCHC: 32.9 g/dL (ref 32.0–36.0)
MCV: 95.3 fL (ref 80.0–100.0)
Monocytes Absolute: 0.4 10*3/uL (ref 0.2–0.9)
Monocytes Relative: 8 %
NEUTROS PCT: 59 %
Neutro Abs: 2.9 10*3/uL (ref 1.4–6.5)
PLATELETS: 172 10*3/uL (ref 150–440)
RBC: 3.85 MIL/uL (ref 3.80–5.20)
RDW: 19.2 % — ABNORMAL HIGH (ref 11.5–14.5)
WBC: 5 10*3/uL (ref 3.6–11.0)

## 2017-12-04 LAB — COMPREHENSIVE METABOLIC PANEL
ALT: 16 U/L (ref 14–54)
ANION GAP: 5 (ref 5–15)
AST: 27 U/L (ref 15–41)
Albumin: 3.7 g/dL (ref 3.5–5.0)
Alkaline Phosphatase: 112 U/L (ref 38–126)
BILIRUBIN TOTAL: 0.6 mg/dL (ref 0.3–1.2)
BUN: 16 mg/dL (ref 6–20)
CHLORIDE: 112 mmol/L — AB (ref 101–111)
CO2: 22 mmol/L (ref 22–32)
Calcium: 9.2 mg/dL (ref 8.9–10.3)
Creatinine, Ser: 0.95 mg/dL (ref 0.44–1.00)
GFR, EST NON AFRICAN AMERICAN: 57 mL/min — AB (ref >60)
Glucose, Bld: 157 mg/dL — ABNORMAL HIGH (ref 65–99)
POTASSIUM: 3.7 mmol/L (ref 3.5–5.1)
Sodium: 139 mmol/L (ref 135–145)
TOTAL PROTEIN: 6.6 g/dL (ref 6.5–8.1)

## 2017-12-04 MED ORDER — PALONOSETRON HCL INJECTION 0.25 MG/5ML
0.2500 mg | Freq: Once | INTRAVENOUS | Status: AC
  Administered 2017-12-04: 0.25 mg via INTRAVENOUS
  Filled 2017-12-04: qty 5

## 2017-12-04 MED ORDER — SODIUM CHLORIDE 0.9 % IV SOLN
INTRAVENOUS | Status: DC
  Administered 2017-12-04: 12:00:00 via INTRAVENOUS
  Filled 2017-12-04: qty 1000

## 2017-12-04 MED ORDER — FLUOROURACIL CHEMO INJECTION 2.5 GM/50ML
320.0000 mg/m2 | Freq: Once | INTRAVENOUS | Status: AC
  Administered 2017-12-04: 700 mg via INTRAVENOUS
  Filled 2017-12-04: qty 14

## 2017-12-04 MED ORDER — LEUCOVORIN CALCIUM INJECTION 350 MG: 400.0000 mg/m2 | Freq: Once | INTRAVENOUS | Status: DC

## 2017-12-04 MED ORDER — SODIUM CHLORIDE 0.9 % IV SOLN
1920.0000 mg/m2 | INTRAVENOUS | Status: DC
  Administered 2017-12-04: 4150 mg via INTRAVENOUS
  Filled 2017-12-04: qty 83

## 2017-12-04 MED ORDER — LEUCOVORIN CALCIUM INJECTION 100 MG
20.0000 mg/m2 | Freq: Once | INTRAMUSCULAR | Status: AC
  Administered 2017-12-04: 42 mg via INTRAVENOUS
  Filled 2017-12-04: qty 2.1

## 2017-12-04 NOTE — Assessment & Plan Note (Addendum)
#  COLON CANCER- stage III-on adjuvant chemotherapy with 5FU every 2 weeks; oxaliplatin discontinued after 2 cycles.  Clinically no evidence of recurrence.  Patient tolerating chemotherapy fairly well without any major side effects except-hand-foot syndrome [see discussion below]  #Proceed with cycle #10 of 5-FU continuous infusion; Patient has 2 more cycles of chemotherapy ago. CBC CMP are reviewed; adequate today; no contraindications to chemotherapy. Proceed with treatment today.   # faigue-treatment completed from chemotherapy.   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  #MSI high; negative for Lynch syndrome as per patient.   # follow up in 2 weeks/labs; 5 FU pump; Dr.Yu.

## 2017-12-04 NOTE — Progress Notes (Signed)
[name] OFFICE PROGRESS NOTE  Patient Care Team: Burnard Hawthorne, FNP as PCP - General (Family Medicine) Vladimir Crofts, MD (Neurology) Noreene Filbert, MD as Referring Physician (Radiation Oncology)  Cancer Staging Cancer of descending colon [facility]) Staging form: Colon and Rectum, AJCC 8th Edition - Clinical stage from 07/06/2017: Stage IIIB (cT4a, cN1c, cM0) - Unsigned      Cancer of descending colon ([place])   06/07/2017 Initial Diagnosis    Colon cancer [facility])        This is my first interaction with the patient as patient's primary oncologist has been Dr.Yu. I reviewed the patient's prior charts/pertinent labs/imaging in detail.     INTERVAL HISTORY:  [name] 77 y.o.  female pleasant patient above history of stage III colon cancer; MSI high-currently on adjuvant chemotherapy with 5-FU infusion every 2 weeks.  Patient complains of mild to moderate fatigue.  However this is not interrupting her daily activities.  Denies any nausea vomiting.  Denies any sores in the mouth.  She has mild peeling of her hands-which has improved since.  REVIEW OF SYSTEMS:  A complete 10 point review of system is done which is negative except mentioned above/history of present illness.   PAST MEDICAL HISTORY :  Past Medical History:  Diagnosis Date  . Arthritis    bitateral knees and left foot,s/p cortisone injection in past  . Chronic constipation   . Colon cancer ([place]) 2018   pt is undergoing chemo 11-09-17  . Complication of anesthesia     slow to wake up   . Endometrial cancer ([place]) 2014   Total Hysterectomy and Rad tx's.   . Family history of cancer   . Genetic testing 09/26/2017    Multi-Cancer panel (83 genes) @ [facility] - No pathogenic mutations detected  . GERD (gastroesophageal reflux disease)   . Hypertension   . IBS (irritable bowel syndrome)   . Menopause    age 46  . PONV (postoperative nausea and vomiting)   . Vitamin D deficiency 06/07/10     PAST SURGICAL HISTORY :   Past Surgical History:  Procedure Laterality Date  . BUNIONECTOMY    . COLONOSCOPY WITH PROPOFOL N/A 04/04/2017   Procedure: COLONOSCOPY WITH PROPOFOL;  Surgeon: [name], MD;  Location: [facility] ENDOSCOPY;  Service: Endoscopy;  Laterality: N/A;  . DILATION AND CURETTAGE OF UTERUS  1971  . FOOT SURGERY    . NASAL SINUS SURGERY    . PORTACATH PLACEMENT Right 07/12/2017   Procedure: INSERTION PORT-A-CATH;  Surgeon: Jules Husbands, MD;  Location: [facility] ORS;  Service: General;  Laterality: Right;  . ROBOTIC ASSISTED LAPAROSCOPIC VENTRAL/INCISIONAL HERNIA REPAIR N/A 06/07/2017   Procedure: ROBOTIC INCISIONAL HERNIA REPAIR;  Surgeon: Leighton Ruff, MD;  Location: [facility] ORS;  Service: General;  Laterality: N/A;  . VAGINAL DELIVERY    . VAGINAL HYSTERECTOMY      FAMILY HISTORY :   Family History  Problem Relation Age of Onset  . Heart disease Father 51       massive MI  . Diabetes Father   . Bipolar disorder Sister   . Osteoporosis Sister   . Breast cancer Sister 69       currently 67  . Cancer Sister        breast  . Bipolar disorder Maternal Aunt   . Breast cancer Maternal Aunt        age at dx unknown; deceased 63s  . Colon cancer Maternal Grandfather 78  deceased 54  . Cancer Maternal Grandfather        breast  . Osteoporosis Mother   . Colon cancer Mother        age at dx unknown; deceased 62  . Cancer Maternal Grandmother        unk. type; deceased 87  . Breast cancer Paternal Aunt        deceased 8    SOCIAL HISTORY:   Social History   Tobacco Use  . Smoking status: Never Smoker  . Smokeless tobacco: Never Used  Substance Use Topics  . Alcohol use: Yes    Comment: socially/rarely  . Drug use: No    ALLERGIES:  is allergic to codeine.  MEDICATIONS:  Current Outpatient Medications  Medication Sig Dispense Refill  . chlorhexidine (PERIDEX) 0.12 % solution Use as directed 15 mLs in the mouth or throat 2 (two) times daily. 473 mL  0  . fluticasone (FLONASE) 50 MCG/ACT nasal spray Place 2 sprays into both nostrils daily. 16 g 2  . lidocaine-prilocaine (EMLA) cream Apply 1 application topically as needed. 30 g 0  . losartan (COZAAR) 50 MG tablet Take 1 tablet (50 mg total) by mouth daily. 90 tablet 3  . Magnesium 400 MG TABS Take 400 mg by mouth daily as needed (irritable bowel syndrome).    . metoprolol succinate (TOPROL-XL) 100 MG 24 hr tablet Take 1 tablet (100 mg total) by mouth daily. 90 tablet 3  . Multiple Vitamin (MULTIVITAMIN WITH MINERALS) TABS tablet Take 1 tablet by mouth daily.    . Multiple Vitamins-Minerals (ICAPS AREDS 2 PO) Take 2 capsules by mouth daily.    . ondansetron (ZOFRAN) 8 MG tablet Take 1 tablet (8 mg total) by mouth 2 (two) times daily as needed for refractory nausea / vomiting. Start on day 3 after chemotherapy. 30 tablet 1  . Polyethylene Glycol 3350 (MIRALAX PO) Take 17 g by mouth daily as needed (constipation).     . prochlorperazine (COMPAZINE) 10 MG tablet Take 1 tablet (10 mg total) by mouth every 6 (six) hours as needed (Nausea or vomiting). 30 tablet 1  . simvastatin (ZOCOR) 20 MG tablet Take 1 tablet (20 mg total) by mouth every evening. 90 tablet 4  . topiramate (TOPAMAX) 50 MG tablet Take 1 tablet (50 mg total) by mouth 2 (two) times daily. 180 tablet 2   No current facility-administered medications for this visit.    Facility-Administered Medications Ordered in Other Visits  Medication Dose Route Frequency Provider Last Rate Last Dose  . sodium chloride flush (NS) 0.9 % injection 10 mL  10 mL Intracatheter PRN Earlie Server, MD   10 mL at 11/08/17 1400    PHYSICAL EXAMINATION: ECOG PERFORMANCE STATUS: 0 - Asymptomatic  BP (!) 144/72   Pulse (!) 59   Temp 97.9 F (36.6 C) (Tympanic)   Resp 20   Ht '5\' 5"'  (1.651 m)   Wt 214 lb (97.1 kg)   BMI 35.61 kg/m   Filed Weights   12/04/17 1058  Weight: 214 lb (97.1 kg)    GENERAL: Well-nourished well-developed; Alert, no distress  and comfortable.   Alone. EYES: no pallor or icterus OROPHARYNX: no thrush or ulceration; good dentition  NECK: supple, no masses felt LYMPH:  no palpable lymphadenopathy in the cervical, axillary or inguinal regions LUNGS: clear to auscultation and  No wheeze or crackles HEART/CVS: regular rate & rhythm and no murmurs; No lower extremity edema ABDOMEN:abdomen soft, non-tender and normal bowel sounds Musculoskeletal:no  cyanosis of digits and no clubbing  PSYCH: alert & oriented x 3 with fluent speech NEURO: no focal motor/sensory deficits SKIN:  no rashes or significant lesions; mild desquamation noted in the hands bilaterally.  LABORATORY DATA:  I have reviewed the data as listed    Component Value Date/Time   NA 139 12/04/2017 1015   NA 138 12/16/2013 0959   K 3.7 12/04/2017 1015   K 4.2 12/16/2013 0959   CL 112 (H) 12/04/2017 1015   CL 108 (H) 12/16/2013 0959   CO2 22 12/04/2017 1015   CO2 28 12/16/2013 0959   GLUCOSE 157 (H) 12/04/2017 1015   GLUCOSE 107 (H) 12/16/2013 0959   BUN 16 12/04/2017 1015   BUN 19 (H) 12/16/2013 0959   CREATININE 0.95 12/04/2017 1015   CREATININE 0.93 12/16/2013 0959   CREATININE 0.85 09/20/2013 1549   CALCIUM 9.2 12/04/2017 1015   CALCIUM 9.2 12/16/2013 0959   PROT 6.6 12/04/2017 1015   PROT 7.7 01/19/2013 1004   ALBUMIN 3.7 12/04/2017 1015   ALBUMIN 3.5 01/19/2013 1004   AST 27 12/04/2017 1015   AST 27 01/19/2013 1004   ALT 16 12/04/2017 1015   ALT 27 01/19/2013 1004   ALKPHOS 112 12/04/2017 1015   ALKPHOS 117 01/19/2013 1004   BILITOT 0.6 12/04/2017 1015   BILITOT 0.7 01/19/2013 1004   GFRNONAA 57 (L) 12/04/2017 1015   GFRNONAA >60 12/16/2013 0959   GFRAA >60 12/04/2017 1015   GFRAA >60 12/16/2013 0959    No results found for: SPEP, UPEP  Lab Results  Component Value Date   WBC 5.0 12/04/2017   NEUTROABS 2.9 12/04/2017   HGB 12.1 12/04/2017   HCT 36.7 12/04/2017   MCV 95.3 12/04/2017   PLT 172 12/04/2017      Chemistry       Component Value Date/Time   NA 139 12/04/2017 1015   NA 138 12/16/2013 0959   K 3.7 12/04/2017 1015   K 4.2 12/16/2013 0959   CL 112 (H) 12/04/2017 1015   CL 108 (H) 12/16/2013 0959   CO2 22 12/04/2017 1015   CO2 28 12/16/2013 0959   BUN 16 12/04/2017 1015   BUN 19 (H) 12/16/2013 0959   CREATININE 0.95 12/04/2017 1015   CREATININE 0.93 12/16/2013 0959   CREATININE 0.85 09/20/2013 1549   GLU 97 04/19/2010 1609      Component Value Date/Time   CALCIUM 9.2 12/04/2017 1015   CALCIUM 9.2 12/16/2013 0959   ALKPHOS 112 12/04/2017 1015   ALKPHOS 117 01/19/2013 1004   AST 27 12/04/2017 1015   AST 27 01/19/2013 1004   ALT 16 12/04/2017 1015   ALT 27 01/19/2013 1004   BILITOT 0.6 12/04/2017 1015   BILITOT 0.7 01/19/2013 1004       RADIOGRAPHIC STUDIES: I have personally reviewed the radiological images as listed and agreed with the findings in the report. No results found.   ASSESSMENT & PLAN:  Cancer of descending colon ([place]) # COLON CANCER- stage III-on adjuvant chemotherapy with 5FU every 2 weeks; oxaliplatin discontinued after 2 cycles.  Clinically no evidence of recurrence.  Patient tolerating chemotherapy fairly well without any major side effects except-hand-foot syndrome [see discussion below]  #Proceed with cycle #10 of 5-FU continuous infusion; Patient has 2 more cycles of chemotherapy ago. CBC CMP are reviewed; adequate today; no contraindications to chemotherapy. Proceed with treatment today.   # faigue-treatment completed from chemotherapy.   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  #MSI  high; negative for Lynch syndrome as per patient.   # follow up in 2 weeks/labs; 5 FU pump; Dr.Yu.    Orders Placed This Encounter  Procedures  . CBC with Differential/Platelet    Standing Status:   Future    Standing Expiration Date:   12/04/2018  . Comprehensive metabolic panel    Standing Status:   Future    Standing Expiration Date:   12/04/2018   All  questions were answered. The patient knows to call the clinic with any problems, questions or concerns.      Cammie Sickle, MD 12/04/2017 5:04 PM

## 2017-12-04 NOTE — Progress Notes (Signed)
Pt here for chemotherapy treatment. Pt reports fatigue with ADLs.  Pt states that this morning she was dizzy this morning when getting out of bed.

## 2017-12-06 ENCOUNTER — Inpatient Hospital Stay: Payer: Medicare HMO

## 2017-12-06 VITALS — BP 129/67 | HR 74 | Temp 98.4°F | Resp 18

## 2017-12-06 DIAGNOSIS — E559 Vitamin D deficiency, unspecified: Secondary | ICD-10-CM | POA: Diagnosis not present

## 2017-12-06 DIAGNOSIS — C186 Malignant neoplasm of descending colon: Secondary | ICD-10-CM | POA: Diagnosis not present

## 2017-12-06 DIAGNOSIS — K589 Irritable bowel syndrome without diarrhea: Secondary | ICD-10-CM | POA: Diagnosis not present

## 2017-12-06 DIAGNOSIS — I10 Essential (primary) hypertension: Secondary | ICD-10-CM | POA: Diagnosis not present

## 2017-12-06 DIAGNOSIS — L271 Localized skin eruption due to drugs and medicaments taken internally: Secondary | ICD-10-CM | POA: Diagnosis not present

## 2017-12-06 DIAGNOSIS — Z5111 Encounter for antineoplastic chemotherapy: Secondary | ICD-10-CM | POA: Diagnosis not present

## 2017-12-06 DIAGNOSIS — R222 Localized swelling, mass and lump, trunk: Secondary | ICD-10-CM | POA: Diagnosis not present

## 2017-12-06 DIAGNOSIS — K219 Gastro-esophageal reflux disease without esophagitis: Secondary | ICD-10-CM | POA: Diagnosis not present

## 2017-12-06 DIAGNOSIS — D492 Neoplasm of unspecified behavior of bone, soft tissue, and skin: Secondary | ICD-10-CM | POA: Diagnosis not present

## 2017-12-06 MED ORDER — SODIUM CHLORIDE 0.9% FLUSH
10.0000 mL | INTRAVENOUS | Status: DC | PRN
  Administered 2017-12-06: 10 mL
  Filled 2017-12-06: qty 10

## 2017-12-06 MED ORDER — HEPARIN SOD (PORK) LOCK FLUSH 100 UNIT/ML IV SOLN
500.0000 [IU] | Freq: Once | INTRAVENOUS | Status: AC | PRN
  Administered 2017-12-06: 500 [IU]
  Filled 2017-12-06: qty 5

## 2017-12-11 ENCOUNTER — Encounter: Payer: Self-pay | Admitting: Family

## 2017-12-15 NOTE — Telephone Encounter (Signed)
Copied from [place] [id number]. Topic: Quick Communication - Rx Refill/Question >> Dec 15, 2017 11:17 AM Patrice Paradise wrote: Medication: topiramate (TOPAMAX) 50 MG tablet   Has the patient contacted their pharmacy? Yes.     (Agent: If no, request that the patient contact the pharmacy for the refill.)   Preferred Pharmacy (with phone number or street name):  [facility], [place] [address] Phone: [phone] Fax: [phone]     Agent: Please be advised that RX refills may take up to 3 business days. We ask that you follow-up with your pharmacy.

## 2017-12-17 NOTE — Progress Notes (Signed)
Hematology/Oncology Follow Up visit. [place] [phone] [phone]  CONSULT NOTE Patient Care Team: Burnard Hawthorne, FNP as PCP - General (Family Medicine) Vladimir Crofts, MD (Neurology) Noreene Filbert, MD as Referring Physician (Radiation Oncology)  PURPOSE OF CONSULTATION:  Follow up for treatment of Colon cancer  HISTORY OF PRESENTING ILLNESS:  [name] 77 y.o.  female with PMH listed as below was referred to me for evaluation and management of colon cancer.  She had colonoscopy evaluation due to positive cologuard.  She was found to have a nonobstructing mass approximately 20 cm from the anal verge which biopsy proved to be adenocarcinoma. CT scan showed a large mass from the sigmoid and extends beyond the wall of the colon toward the left into the surrounding fat. Preoperational CEA was normal. Patient underwent robotic sigmoidectomy on 06/07/2017.   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 MLH1  loss of function, PMS2 loss of function. Declined option of clinical trial ATOMIC She has a history of endometrial cancer s/p hysterotomy and radiation. She has a family history of colon cancer and breast cancer.  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 Results have been explained to patient by genetic counselor.   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   INTERVAL HISTORY 77 yo female with above history presents to evaluation prior to cycle 11 single agent 5-FU adjuvant chemotherapy. 5-FU was reduced to 80% dose due to mild hand-foot syndrome.  Patient reports doing well, denies any diarrhea.  The only symptom that been concerned him is recently runny eyes and runny nose with her chronic sinusitis symptoms.  The dry skin peeling on her hands have improved.   Denies any fever, chills, diarrhea, abdominal pain.    Review of Systems  Constitutional: Negative for appetite change, fatigue and fever.  HENT:   Negative for hearing loss, lump/mass, mouth sores and nosebleeds.   Eyes:       Dry eyes and runny eyes.  Respiratory: Negative for cough and shortness of breath.   Cardiovascular: Negative for chest pain and leg swelling.  Gastrointestinal: Negative for abdominal pain, blood in stool, constipation, diarrhea and nausea.  Endocrine: Negative for hot flashes.  Genitourinary: Negative for difficulty urinating, hematuria, nocturia and pelvic pain.   Musculoskeletal: Negative for arthralgias.  Skin: Negative for itching.       Right hand fingers skin peeling resolved.  Neurological: Negative for dizziness and headaches.  Hematological: Negative for adenopathy.  Psychiatric/Behavioral: Negative for confusion. The patient is not nervous/anxious.    MEDICAL HISTORY:  Past Medical History:  Diagnosis Date  . Arthritis    bitateral knees and left foot,s/p cortisone injection in past  . Chronic constipation   . Colon cancer ([place]) 2018   pt is undergoing chemo 11-09-17  . Complication of anesthesia     slow to wake up   . Endometrial cancer ([place]) 2014   Total Hysterectomy and Rad tx's.   . Family history of cancer   . Genetic testing 09/26/2017    Multi-Cancer panel (83 genes) @ [facility] - No pathogenic mutations detected  . GERD (gastroesophageal reflux disease)   . Hypertension   . IBS (irritable bowel syndrome)   . Menopause    age 49  . PONV (postoperative nausea and vomiting)   . Vitamin D  deficiency 06/07/10    SURGICAL HISTORY: Past Surgical History:  Procedure Laterality Date  . BUNIONECTOMY    . COLONOSCOPY WITH PROPOFOL N/A 04/04/2017   Procedure: COLONOSCOPY WITH PROPOFOL;  Surgeon: [name], MD;  Location[facility] ENDOSCOPY;  Service: Endoscopy;  Laterality: N/A;  . DILATION AND CURETTAGE OF UTERUS  1971  . FOOT SURGERY     . NASAL SINUS SURGERY    . PORTACATH PLACEMENT Right 07/12/2017   Procedure: INSERTION PORT-A-CATH;  Surgeon: Jules Husbands, MD;  Location: [facility] ORS;  Service: General;  Laterality: Right;  . ROBOTIC ASSISTED LAPAROSCOPIC VENTRAL/INCISIONAL HERNIA REPAIR N/A 06/07/2017   Procedure: ROBOTIC INCISIONAL HERNIA REPAIR;  Surgeon: Leighton Ruff, MD;  Location: [facility] ORS;  Service: General;  Laterality: N/A;  . VAGINAL DELIVERY    . VAGINAL HYSTERECTOMY      SOCIAL HISTORY: Social History   Socioeconomic History  . Marital status: Divorced    Spouse name: Not on file  . Number of children: Not on file  . Years of education: Not on file  . Highest education level: Not on file  Social Needs  . Financial resource strain: Not on file  . Food insecurity - worry: Not on file  . Food insecurity - inability: Not on file  . Transportation needs - medical: Not on file  . Transportation needs - non-medical: Not on file  Occupational History  . Not on file  Tobacco Use  . Smoking status: Never Smoker  . Smokeless tobacco: Never Used  Substance and Sexual Activity  . Alcohol use: Yes    Comment: socially/rarely  . Drug use: No  . Sexual activity: No  Other Topics Concern  . Not on file  Social History Narrative   Exercise- aerobic exercise at home 3-4x week.       Lives in [place]. No pets.    FAMILY HISTORY: Family History  Problem Relation Age of Onset  . Heart disease Father 32       massive MI  . Diabetes Father   . Bipolar disorder Sister   . Osteoporosis Sister   . Breast cancer Sister 39       currently 38  . Cancer Sister        breast  . Bipolar disorder Maternal Aunt   . Breast cancer Maternal Aunt        age at dx unknown; deceased 71s  . Colon cancer Maternal Grandfather 60       deceased 83  . Cancer Maternal Grandfather        breast  . Osteoporosis Mother   . Colon cancer Mother        age at dx unknown; deceased 40  . Cancer Maternal Grandmother         unk. type; deceased 23  . Breast cancer Paternal Aunt        deceased 38    ALLERGIES:  is allergic to codeine.  MEDICATIONS:  Current Outpatient Medications  Medication Sig Dispense Refill  . chlorhexidine (PERIDEX) 0.12 % solution Use as directed 15 mLs in the mouth or throat 2 (two) times daily. 473 mL 0  . fluticasone (FLONASE) 50 MCG/ACT nasal spray Place 2 sprays into both nostrils daily. 16 g 2  . lidocaine-prilocaine (EMLA) cream Apply 1 application topically as needed. 30 g 0  . losartan (COZAAR) 50 MG tablet Take 1 tablet (50 mg total) by mouth daily. 90 tablet 3  . Magnesium 400 MG TABS Take 400 mg by  mouth daily as needed (irritable bowel syndrome).    . metoprolol succinate (TOPROL-XL) 100 MG 24 hr tablet Take 1 tablet (100 mg total) by mouth daily. 90 tablet 3  . Multiple Vitamin (MULTIVITAMIN WITH MINERALS) TABS tablet Take 1 tablet by mouth daily.    . Multiple Vitamins-Minerals (ICAPS AREDS 2 PO) Take 2 capsules by mouth daily.    . ondansetron (ZOFRAN) 8 MG tablet Take 1 tablet (8 mg total) by mouth 2 (two) times daily as needed for refractory nausea / vomiting. Start on day 3 after chemotherapy. 30 tablet 1  . Polyethylene Glycol 3350 (MIRALAX PO) Take 17 g by mouth daily as needed (constipation).     . prochlorperazine (COMPAZINE) 10 MG tablet Take 1 tablet (10 mg total) by mouth every 6 (six) hours as needed (Nausea or vomiting). 30 tablet 1  . simvastatin (ZOCOR) 20 MG tablet Take 1 tablet (20 mg total) by mouth every evening. 90 tablet 4  . topiramate (TOPAMAX) 50 MG tablet Take 1 tablet (50 mg total) by mouth 2 (two) times daily. 180 tablet 2   No current facility-administered medications for this visit.    Facility-Administered Medications Ordered in Other Visits  Medication Dose Route Frequency Provider Last Rate Last Dose  . fluorouracil (ADRUCIL) 4,150 mg in sodium chloride 0.9 % 67 mL chemo infusion  1,920 mg/m2 (Treatment Plan Recorded) Intravenous 1 day or 1  dose Earlie Server, MD   4,150 mg at 12/18/17 1143  . sodium chloride flush (NS) 0.9 % injection 10 mL  10 mL Intracatheter PRN Earlie Server, MD   10 mL at 11/08/17 1400      .  PHYSICAL EXAMINATION: ECOG PERFORMANCE STATUS: 1 - Symptomatic but completely ambulatory Vitals:   12/18/17 1024  BP: 109/69  Pulse: 60  Temp: (!) 97.4 F (36.3 C)   Filed Weights   12/18/17 1024  Weight: 208 lb 5 oz (94.5 kg)   Physical Exam  Constitutional: No distress.  HENT:  Head: Normocephalic.  Eyes: EOM are normal. Pupils are equal, round, and reactive to light.  Neck: Normal range of motion. Neck supple.  Cardiovascular: Normal rate and regular rhythm.  No murmur heard. Pulmonary/Chest: Effort normal and breath sounds normal.  Abdominal: Soft. There is no tenderness.  Musculoskeletal: She exhibits no edema.  Neurological: No cranial nerve deficit.  Skin: Skin is warm. No erythema.  Psychiatric: She has a normal mood and affect.    LABORATORY DATA:  I have reviewed the data as listed Lab Results  Component Value Date   WBC 5.0 12/04/2017   HGB 12.1 12/04/2017   HCT 36.7 12/04/2017   MCV 95.3 12/04/2017   PLT 172 12/04/2017   Recent Labs    11/06/17 0806 11/20/17 0852 12/04/17 1015  NA 136 139 139  K 4.0 4.0 3.7  CL 110 110 112*  CO2 22 21* 22  GLUCOSE 152* 126* 157*  BUN '18 14 16  ' CREATININE 0.97 0.90 0.95  CALCIUM 8.9 9.3 9.2  GFRNONAA 55* >60 57*  GFRAA >60 >60 >60  PROT 7.0 7.4 6.6  ALBUMIN 3.6 4.0 3.7  AST '30 31 27  ' ALT '15 18 16  ' ALKPHOS 122 132* 112  BILITOT 0.7 1.0 0.6    RADIOGRAPHIC STUDIES: I have personally reviewed the radiological images as listed and agreed with the findings in the report. 04/12/2017 CT abdomen pelvis w contrast Large mass arising from the sigmoid colon. This mass extends beyond the wall of the colon toward  the left into the surrounding fat. This lesion does not reach the left pelvic sidewall.No adenopathy or liver lesions evident. No omental  lesions appreciable. Sizable midline ventral hernia slightly superior to theumbilicus containing fat and a loop of colon without bowel compromise. Much smaller midline umbilical ventral hernia containingonly fat. There is a small hiatal hernia. No bowel obstruction.  No abscess.  Appendix region appears normal. No renal or ureteral calculus.  No hydronephrosis.There is aortoiliac atherosclerosis. There are foci of coronary artery calcification.    ASSESSMENT & PLAN:  77 y.o.female who has Stage IIIB colon cancer.  Cancer Staging Cancer of descending colon [facility]) Staging form: Colon and Rectum, AJCC 8th Edition - Clinical stage from 07/06/2017: Stage IIIB (cT4a, cN1c, cM0) - Unsigned  1. Malignant neoplasm of descending colon ([place])   2. Encounter for antineoplastic chemotherapy   3. Palmar plantar erythrodysesthesia   4. Family history of cancer   5. Family history of breast cancer   6. Family history of colon cancer   7. Hyperglycemia     #  Proceed Cycle 11-FU adjuvant chemotherapy, continue with  reduce to 80% due to hand and foot syndrome.  Patient tolerates well. #  T4 tumor,will set up appointment with RadOnc after she finsihes adjuvant chemotherapy # Hyperglycemia: check A1C  #  Palmar plantar erythrodysesthesia:. 5-FU has been reduced to 80% dose. Almost resolved.  All questions were answered. The patient knows to call the clinic with any problems questions or concerns.  Return of visit: She will see me in 2 weeks prior to cycle 12 adjuvant 5-FU, cbc, cmp prior to treatment, need referral to RadOnc afterward.   Earlie Server, MD, PhD Hematology Oncology [facility] at [facility] Pager- [phone] 12/18/2017

## 2017-12-18 ENCOUNTER — Other Ambulatory Visit: Payer: Self-pay

## 2017-12-18 ENCOUNTER — Inpatient Hospital Stay: Payer: Medicare HMO | Attending: Oncology | Admitting: Oncology

## 2017-12-18 ENCOUNTER — Inpatient Hospital Stay: Payer: Medicare HMO

## 2017-12-18 ENCOUNTER — Telehealth: Payer: Self-pay | Admitting: Family

## 2017-12-18 ENCOUNTER — Encounter: Payer: Self-pay | Admitting: Oncology

## 2017-12-18 VITALS — BP 109/69 | HR 60 | Temp 97.4°F | Wt 208.3 lb

## 2017-12-18 DIAGNOSIS — K5909 Other constipation: Secondary | ICD-10-CM | POA: Insufficient documentation

## 2017-12-18 DIAGNOSIS — Z5111 Encounter for antineoplastic chemotherapy: Secondary | ICD-10-CM | POA: Insufficient documentation

## 2017-12-18 DIAGNOSIS — K589 Irritable bowel syndrome without diarrhea: Secondary | ICD-10-CM | POA: Insufficient documentation

## 2017-12-18 DIAGNOSIS — C186 Malignant neoplasm of descending colon: Secondary | ICD-10-CM | POA: Diagnosis not present

## 2017-12-18 DIAGNOSIS — Z79899 Other long term (current) drug therapy: Secondary | ICD-10-CM | POA: Insufficient documentation

## 2017-12-18 DIAGNOSIS — Z809 Family history of malignant neoplasm, unspecified: Secondary | ICD-10-CM

## 2017-12-18 DIAGNOSIS — E559 Vitamin D deficiency, unspecified: Secondary | ICD-10-CM | POA: Diagnosis not present

## 2017-12-18 DIAGNOSIS — I10 Essential (primary) hypertension: Secondary | ICD-10-CM | POA: Diagnosis not present

## 2017-12-18 DIAGNOSIS — R739 Hyperglycemia, unspecified: Secondary | ICD-10-CM | POA: Diagnosis not present

## 2017-12-18 DIAGNOSIS — K219 Gastro-esophageal reflux disease without esophagitis: Secondary | ICD-10-CM | POA: Insufficient documentation

## 2017-12-18 DIAGNOSIS — D492 Neoplasm of unspecified behavior of bone, soft tissue, and skin: Secondary | ICD-10-CM | POA: Insufficient documentation

## 2017-12-18 DIAGNOSIS — L271 Localized skin eruption due to drugs and medicaments taken internally: Secondary | ICD-10-CM | POA: Insufficient documentation

## 2017-12-18 DIAGNOSIS — R222 Localized swelling, mass and lump, trunk: Secondary | ICD-10-CM | POA: Insufficient documentation

## 2017-12-18 DIAGNOSIS — C189 Malignant neoplasm of colon, unspecified: Secondary | ICD-10-CM | POA: Diagnosis not present

## 2017-12-18 DIAGNOSIS — Z800 Family history of malignant neoplasm of digestive organs: Secondary | ICD-10-CM

## 2017-12-18 DIAGNOSIS — Z803 Family history of malignant neoplasm of breast: Secondary | ICD-10-CM

## 2017-12-18 LAB — COMPREHENSIVE METABOLIC PANEL
ALBUMIN: 3.7 g/dL (ref 3.5–5.0)
ALT: 17 U/L (ref 14–54)
ANION GAP: 9 (ref 5–15)
AST: 28 U/L (ref 15–41)
Alkaline Phosphatase: 117 U/L (ref 38–126)
BUN: 22 mg/dL — ABNORMAL HIGH (ref 6–20)
CHLORIDE: 107 mmol/L (ref 101–111)
CO2: 21 mmol/L — AB (ref 22–32)
Calcium: 9.2 mg/dL (ref 8.9–10.3)
Creatinine, Ser: 0.95 mg/dL (ref 0.44–1.00)
GFR calc Af Amer: >60 mL/min (ref >60)
GFR calc non Af Amer: 57 mL/min — ABNORMAL LOW (ref >60)
GLUCOSE: 142 mg/dL — AB (ref 65–99)
Potassium: 3.8 mmol/L (ref 3.5–5.1)
SODIUM: 137 mmol/L (ref 135–145)
TOTAL PROTEIN: 6.9 g/dL (ref 6.5–8.1)
Total Bilirubin: 0.7 mg/dL (ref 0.3–1.2)

## 2017-12-18 LAB — CBC WITH DIFFERENTIAL/PLATELET
BASOS PCT: 1 %
Basophils Absolute: 0 10*3/uL (ref 0–0.1)
EOS ABS: 0.1 10*3/uL (ref 0–0.7)
Eosinophils Relative: 4 %
HCT: 35.5 % (ref 35.0–47.0)
Hemoglobin: 11.8 g/dL — ABNORMAL LOW (ref 12.0–16.0)
Lymphocytes Relative: 36 %
Lymphs Abs: 1.5 10*3/uL (ref 1.0–3.6)
MCH: 31.7 pg (ref 26.0–34.0)
MCHC: 33.1 g/dL (ref 32.0–36.0)
MCV: 95.8 fL (ref 80.0–100.0)
MONO ABS: 0.5 10*3/uL (ref 0.2–0.9)
MONOS PCT: 11 %
NEUTROS PCT: 48 %
Neutro Abs: 2.1 10*3/uL (ref 1.4–6.5)
PLATELETS: 184 10*3/uL (ref 150–440)
RBC: 3.71 MIL/uL — ABNORMAL LOW (ref 3.80–5.20)
RDW: 19.1 % — AB (ref 11.5–14.5)
WBC: 4.2 10*3/uL (ref 3.6–11.0)

## 2017-12-18 LAB — HEMOGLOBIN A1C
Hgb A1c MFr Bld: 5.8 % — ABNORMAL HIGH (ref 4.8–5.6)
Mean Plasma Glucose: 119.76 mg/dL

## 2017-12-18 MED ORDER — LEUCOVORIN CALCIUM INJECTION 350 MG: 400.0000 mg/m2 | Freq: Once | INTRAVENOUS | Status: DC

## 2017-12-18 MED ORDER — LEUCOVORIN CALCIUM INJECTION 100 MG
20.0000 mg/m2 | Freq: Once | INTRAMUSCULAR | Status: AC
  Administered 2017-12-18: 42 mg via INTRAVENOUS
  Filled 2017-12-18: qty 2.1

## 2017-12-18 MED ORDER — SODIUM CHLORIDE 0.9 % IV SOLN
1920.0000 mg/m2 | INTRAVENOUS | Status: DC
  Administered 2017-12-18: 4150 mg via INTRAVENOUS
  Filled 2017-12-18: qty 83

## 2017-12-18 MED ORDER — PALONOSETRON HCL INJECTION 0.25 MG/5ML
0.2500 mg | Freq: Once | INTRAVENOUS | Status: AC
  Administered 2017-12-18: 0.25 mg via INTRAVENOUS
  Filled 2017-12-18: qty 5

## 2017-12-18 MED ORDER — DEXTROSE 5 % IV SOLN
Freq: Once | INTRAVENOUS | Status: AC
  Administered 2017-12-18: 11:00:00 via INTRAVENOUS
  Filled 2017-12-18: qty 1000

## 2017-12-18 MED ORDER — FLUOROURACIL CHEMO INJECTION 2.5 GM/50ML
320.0000 mg/m2 | Freq: Once | INTRAVENOUS | Status: AC
  Administered 2017-12-18: 700 mg via INTRAVENOUS
  Filled 2017-12-18: qty 14

## 2017-12-18 NOTE — Telephone Encounter (Signed)
  Pt called to check on status of refill.  Copied from [place] [id number]. Topic: Quick Communication - Rx Refill/Question >> Dec 15, 2017 11:17 AM Patrice Paradise wrote: Medication: topiramate (TOPAMAX) 50 MG tablet   Has the patient contacted their pharmacy? Yes.     (Agent: If no, request that the patient contact the pharmacy for the refill.)   Preferred Pharmacy (with phone number or street name):  [place], [place] [address] Phone: [phone] Fax: [phone]    Pt says she was given this as fax number   [phone]     Agent: Please be advised that RX refills may take up to 3 business days. We ask that you follow-up with your pharmacy.

## 2017-12-18 NOTE — Telephone Encounter (Signed)
Can you help with this.  It appears that the topamax was refilled on 03/01/17 by CMA.  Prior to this, this was apparently being refilled by another MD (per review of med history).  Just need more information.  Why does she take this medication and who was refilling prior to [facility].  (it does not appear Dr Gilford Rile was refilling).  See me if questions.  Thanks

## 2017-12-18 NOTE — Progress Notes (Signed)
Patient here today for follow up.  Patient states no new concerns today  

## 2017-12-18 NOTE — Telephone Encounter (Signed)
Last office visit 11/15/17 [facility] 04/11/17 seen [name] No office visit scheduled

## 2017-12-19 MED ORDER — TOPIRAMATE 50 MG PO TABS: 50.0000 mg | ORAL_TABLET | Freq: Two times a day (BID) | ORAL | 0 refills | Status: DC

## 2017-12-19 NOTE — Telephone Encounter (Signed)
Patient takes for uncontrollable tremors,originally prescribed by Dr. Jacquiline Doe, was being refilled also by Dr. Gilford Rile for same DX and by NP.

## 2017-12-19 NOTE — Telephone Encounter (Signed)
rx ok'd for topamax #90 with no refills.  Sent rx to [facility] as requested.

## 2017-12-19 NOTE — Telephone Encounter (Signed)
Patient is aware 

## 2017-12-20 ENCOUNTER — Inpatient Hospital Stay: Payer: Medicare HMO

## 2017-12-20 VITALS — BP 112/69 | HR 60 | Temp 98.7°F | Resp 18

## 2017-12-20 DIAGNOSIS — Z5111 Encounter for antineoplastic chemotherapy: Secondary | ICD-10-CM | POA: Diagnosis not present

## 2017-12-20 DIAGNOSIS — R739 Hyperglycemia, unspecified: Secondary | ICD-10-CM | POA: Diagnosis not present

## 2017-12-20 DIAGNOSIS — I10 Essential (primary) hypertension: Secondary | ICD-10-CM | POA: Diagnosis not present

## 2017-12-20 DIAGNOSIS — C186 Malignant neoplasm of descending colon: Secondary | ICD-10-CM

## 2017-12-20 DIAGNOSIS — L271 Localized skin eruption due to drugs and medicaments taken internally: Secondary | ICD-10-CM | POA: Diagnosis not present

## 2017-12-20 DIAGNOSIS — K219 Gastro-esophageal reflux disease without esophagitis: Secondary | ICD-10-CM | POA: Diagnosis not present

## 2017-12-20 DIAGNOSIS — K589 Irritable bowel syndrome without diarrhea: Secondary | ICD-10-CM | POA: Diagnosis not present

## 2017-12-20 DIAGNOSIS — R222 Localized swelling, mass and lump, trunk: Secondary | ICD-10-CM | POA: Diagnosis not present

## 2017-12-20 DIAGNOSIS — D492 Neoplasm of unspecified behavior of bone, soft tissue, and skin: Secondary | ICD-10-CM | POA: Diagnosis not present

## 2017-12-20 MED ORDER — HEPARIN SOD (PORK) LOCK FLUSH 100 UNIT/ML IV SOLN
INTRAVENOUS | Status: AC
  Filled 2017-12-20: qty 5

## 2017-12-20 MED ORDER — SODIUM CHLORIDE 0.9% FLUSH
10.0000 mL | INTRAVENOUS | Status: DC | PRN
  Administered 2017-12-20: 10 mL
  Filled 2017-12-20: qty 10

## 2017-12-20 MED ORDER — HEPARIN SOD (PORK) LOCK FLUSH 100 UNIT/ML IV SOLN
500.0000 [IU] | Freq: Once | INTRAVENOUS | Status: AC | PRN
  Administered 2017-12-20: 500 [IU]

## 2017-12-24 DIAGNOSIS — C189 Malignant neoplasm of colon, unspecified: Secondary | ICD-10-CM | POA: Diagnosis not present

## 2017-12-31 NOTE — Progress Notes (Signed)
Hematology/Oncology Follow Up visit. [place] Telephone:([phone] [phone]) [phone]  CONSULT NOTE Patient Care Team: Burnard Hawthorne, FNP as PCP - General (Family Medicine) Vladimir Crofts, MD (Neurology) Noreene Filbert, MD as Referring Physician (Radiation Oncology)  PURPOSE OF CONSULTATION:  Follow up for treatment of Colon cancer  HISTORY OF PRESENTING ILLNESS:  [name] 76 y.o.  female with PMH listed as below was referred to me for evaluation and management of colon cancer.  She had colonoscopy evaluation due to positive cologuard.  She was found to have a nonobstructing mass approximately 20 cm from the anal verge which biopsy proved to be adenocarcinoma. CT scan showed a large mass from the sigmoid and extends beyond the wall of the colon toward the left into the surrounding fat. Preoperational CEA was normal. Patient underwent robotic sigmoidectomy on 06/07/2017.   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 MLH1  loss of function, PMS2 loss of function. Declined option of clinical trial ATOMIC She has a history of endometrial cancer s/p hysterotomy and radiation. She has a family history of colon cancer and breast cancer.  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 Results have been explained to patient by genetic counselor.   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   INTERVAL HISTORY 77 yo female with above history presents to evaluation prior to cycle 12 single agent 5-FU adjuvant chemotherapy. 5-FU was reduced to 80% dose due to mild hand-foot syndrome.  Patient reports doing well.  She only had one episode of diarrhea which resolved spontaneously.  The dry skin peeling on her hands remains unchanged.  Denies any fever chills or abdominal pain.      Review of  Systems  Constitutional: Negative for appetite change, fatigue and fever.  HENT:   Negative for hearing loss, lump/mass, mouth sores and nosebleeds.   Eyes:       Dry eyes and runny eyes.  Respiratory: Negative for cough and shortness of breath.   Cardiovascular: Negative for chest pain and leg swelling.  Gastrointestinal: Negative for abdominal pain, blood in stool, constipation, diarrhea and nausea.  Endocrine: Negative for hot flashes.  Genitourinary: Negative for hematuria, nocturia and pelvic pain.   Musculoskeletal: Negative for arthralgias.  Skin: Negative for itching.       Right index finger skin peeling   Neurological: Negative for headaches.  Hematological: Negative for adenopathy.  Psychiatric/Behavioral: Negative for confusion. The patient is not nervous/anxious.    MEDICAL HISTORY:  Past Medical History:  Diagnosis Date  . Arthritis    bitateral knees and left foot,s/p cortisone injection in past  . Chronic constipation   . Colon cancer ([place]) 2018   pt is undergoing chemo 11-09-17  . Complication of anesthesia     slow to wake up   . Endometrial cancer ([place]) 2014   Total Hysterectomy and Rad tx's.   . Family history of cancer   . Genetic testing 09/26/2017    Multi-Cancer panel (83 genes) @ [facility] - No pathogenic mutations detected  . GERD (gastroesophageal reflux disease)   . Hypertension   . IBS (irritable bowel syndrome)   . Menopause    age 69  . PONV (postoperative nausea and vomiting)   . Vitamin D deficiency 06/07/10    SURGICAL HISTORY: Past Surgical History:  Procedure Laterality Date  .  BUNIONECTOMY    . COLONOSCOPY WITH PROPOFOL N/A 04/04/2017   Procedure: COLONOSCOPY WITH PROPOFOL;  Surgeon: [name], MD;  Location: [facility] ENDOSCOPY;  Service: Endoscopy;  Laterality: N/A;  . DILATION AND CURETTAGE OF UTERUS  1971  . FOOT SURGERY    . NASAL SINUS SURGERY    . PORTACATH PLACEMENT Right 07/12/2017   Procedure: INSERTION PORT-A-CATH;  Surgeon:  Jules Husbands, MD;  Location: [facility] ORS;  Service: General;  Laterality: Right;  . ROBOTIC ASSISTED LAPAROSCOPIC VENTRAL/INCISIONAL HERNIA REPAIR N/A 06/07/2017   Procedure: ROBOTIC INCISIONAL HERNIA REPAIR;  Surgeon: Leighton Ruff, MD;  Location: [facility] ORS;  Service: General;  Laterality: N/A;  . VAGINAL DELIVERY    . VAGINAL HYSTERECTOMY      SOCIAL HISTORY: Social History   Socioeconomic History  . Marital status: Divorced    Spouse name: Not on file  . Number of children: Not on file  . Years of education: Not on file  . Highest education level: Not on file  Social Needs  . Financial resource strain: Not on file  . Food insecurity - worry: Not on file  . Food insecurity - inability: Not on file  . Transportation needs - medical: Not on file  . Transportation needs - non-medical: Not on file  Occupational History  . Not on file  Tobacco Use  . Smoking status: Never Smoker  . Smokeless tobacco: Never Used  Substance and Sexual Activity  . Alcohol use: Yes    Comment: socially/rarely  . Drug use: No  . Sexual activity: No  Other Topics Concern  . Not on file  Social History Narrative   Exercise- aerobic exercise at home 3-4x week.       Lives in [place]. No pets.    FAMILY HISTORY: Family History  Problem Relation Age of Onset  . Heart disease Father 16       massive MI  . Diabetes Father   . Bipolar disorder Sister   . Osteoporosis Sister   . Breast cancer Sister 65       currently 68  . Cancer Sister        breast  . Bipolar disorder Maternal Aunt   . Breast cancer Maternal Aunt        age at dx unknown; deceased 37s  . Colon cancer Maternal Grandfather 54       deceased 79  . Cancer Maternal Grandfather        breast  . Osteoporosis Mother   . Colon cancer Mother        age at dx unknown; deceased 57  . Cancer Maternal Grandmother        unk. type; deceased 30  . Breast cancer Paternal Aunt        deceased 23    ALLERGIES:  is allergic to  codeine.  MEDICATIONS:  Current Outpatient Medications  Medication Sig Dispense Refill  . chlorhexidine (PERIDEX) 0.12 % solution Use as directed 15 mLs in the mouth or throat 2 (two) times daily. 473 mL 0  . fluticasone (FLONASE) 50 MCG/ACT nasal spray Place 2 sprays into both nostrils daily. 16 g 2  . lidocaine-prilocaine (EMLA) cream Apply 1 application topically as needed. 30 g 0  . losartan (COZAAR) 50 MG tablet Take 1 tablet (50 mg total) by mouth daily. 90 tablet 3  . Magnesium 400 MG TABS Take 400 mg by mouth daily as needed (irritable bowel syndrome).    . metoprolol succinate (TOPROL-XL) 100 MG  24 hr tablet Take 1 tablet (100 mg total) by mouth daily. 90 tablet 3  . Multiple Vitamin (MULTIVITAMIN WITH MINERALS) TABS tablet Take 1 tablet by mouth daily.    . Multiple Vitamins-Minerals (ICAPS AREDS 2 PO) Take 2 capsules by mouth daily.    . ondansetron (ZOFRAN) 8 MG tablet Take 1 tablet (8 mg total) by mouth 2 (two) times daily as needed for refractory nausea / vomiting. Start on day 3 after chemotherapy. 30 tablet 1  . Polyethylene Glycol 3350 (MIRALAX PO) Take 17 g by mouth daily as needed (constipation).     . prochlorperazine (COMPAZINE) 10 MG tablet Take 1 tablet (10 mg total) by mouth every 6 (six) hours as needed (Nausea or vomiting). 30 tablet 1  . simvastatin (ZOCOR) 20 MG tablet Take 1 tablet (20 mg total) by mouth every evening. 90 tablet 4  . topiramate (TOPAMAX) 50 MG tablet Take 1 tablet (50 mg total) by mouth 2 (two) times daily. 180 tablet 0   No current facility-administered medications for this visit.    Facility-Administered Medications Ordered in Other Visits  Medication Dose Route Frequency Provider Last Rate Last Dose  . sodium chloride flush (NS) 0.9 % injection 10 mL  10 mL Intracatheter PRN Earlie Server, MD   10 mL at 11/08/17 1400      .  PHYSICAL EXAMINATION: ECOG PERFORMANCE STATUS: 1 - Symptomatic but completely ambulatory Vitals:   01/01/18 0938  BP:  107/66  Pulse: (!) 51  Resp: 18  Temp: 97.7 F (36.5 C)   Filed Weights   01/01/18 0938  Weight: 207 lb 11.2 oz (94.2 kg)   Physical Exam  Constitutional: No distress.  HENT:  Head: Normocephalic.  Eyes: EOM are normal. Pupils are equal, round, and reactive to light.  Neck: Normal range of motion. Neck supple.  Cardiovascular: Normal rate and regular rhythm.  No murmur heard. Pulmonary/Chest: Effort normal and breath sounds normal.  Abdominal: Soft. There is no tenderness.  Musculoskeletal: She exhibits no edema.  Neurological: No cranial nerve deficit.  Skin: Skin is warm and dry.  Mild skin peeling on right index finger.  Psychiatric: She has a normal mood and affect.    LABORATORY DATA:  I have reviewed the data as listed Lab Results  Component Value Date   WBC 4.2 12/18/2017   HGB 11.8 (L) 12/18/2017   HCT 35.5 12/18/2017   MCV 95.8 12/18/2017   PLT 184 12/18/2017   Recent Labs    11/20/17 0852 12/04/17 1015 12/18/17 1008  NA 139 139 137  K 4.0 3.7 3.8  CL 110 112* 107  CO2 21* 22 21*  GLUCOSE 126* 157* 142*  BUN 14 16 22*  CREATININE 0.90 0.95 0.95  CALCIUM 9.3 9.2 9.2  GFRNONAA >60 57* 57*  GFRAA >60 >60 >60  PROT 7.4 6.6 6.9  ALBUMIN 4.0 3.7 3.7  AST '31 27 28  ' ALT '18 16 17  ' ALKPHOS 132* 112 117  BILITOT 1.0 0.6 0.7    RADIOGRAPHIC STUDIES: I have personally reviewed the radiological images as listed and agreed with the findings in the report. 04/12/2017 CT abdomen pelvis w contrast Large mass arising from the sigmoid colon. This mass extends beyond the wall of the colon toward the left into the surrounding fat. This lesion does not reach the left pelvic sidewall.No adenopathy or liver lesions evident. No omental lesions appreciable. Sizable midline ventral hernia slightly superior to theumbilicus containing fat and a loop of colon without bowel  compromise. Much smaller midline umbilical ventral hernia containingonly fat. There is a small hiatal  hernia. No bowel obstruction.  No abscess.  Appendix region appears normal. No renal or ureteral calculus.  No hydronephrosis.There is aortoiliac atherosclerosis. There are foci of coronary artery calcification.    ASSESSMENT & PLAN:  77 y.o.female who has Stage IIIB colon cancer.  Cancer Staging Cancer of descending colon [facility]) Staging form: Colon and Rectum, AJCC 8th Edition - Clinical stage from 07/06/2017: Stage IIIB (cT4a, cN1c, cM0) - Unsigned  1. Encounter for antineoplastic chemotherapy   2. Palmar plantar erythrodysesthesia   3. Malignant neoplasm of sigmoid colon (HCC)    Proceed with cycle 12 5-FU adjuvant chemotherapy.  Continue to use reduced dose at 80% due to hand-foot syndrome.  She is finishing her adjuvant chemotherapy.  We will schedule port flush every 2 months x3.  #  T4 tumor,will set up appointment with RadOnc after she finsihes adjuvant chemotherapy.  # Hyperglycemia: check A1C  #  Palmar plantar erythrodysesthesia:. 5-FU has been reduced to 80% dose. Almost resolved.  All questions were answered. The patient knows to call the clinic with any problems questions or concerns.  Return of visit: She will see me in 1 month's  cbc, cmp prior to treatment,    Earlie Server, MD, PhD Hematology Oncology [facility] Pager- [phone] 01/01/2018

## 2018-01-01 ENCOUNTER — Inpatient Hospital Stay (HOSPITAL_BASED_OUTPATIENT_CLINIC_OR_DEPARTMENT_OTHER): Payer: Medicare HMO | Admitting: Oncology

## 2018-01-01 ENCOUNTER — Inpatient Hospital Stay: Payer: Medicare HMO

## 2018-01-01 ENCOUNTER — Encounter: Payer: Self-pay | Admitting: Oncology

## 2018-01-01 VITALS — BP 107/66 | HR 51 | Temp 97.7°F | Resp 18 | Ht 65.0 in | Wt 207.7 lb

## 2018-01-01 DIAGNOSIS — K219 Gastro-esophageal reflux disease without esophagitis: Secondary | ICD-10-CM | POA: Diagnosis not present

## 2018-01-01 DIAGNOSIS — D492 Neoplasm of unspecified behavior of bone, soft tissue, and skin: Secondary | ICD-10-CM | POA: Diagnosis not present

## 2018-01-01 DIAGNOSIS — C186 Malignant neoplasm of descending colon: Secondary | ICD-10-CM | POA: Diagnosis not present

## 2018-01-01 DIAGNOSIS — C187 Malignant neoplasm of sigmoid colon: Secondary | ICD-10-CM

## 2018-01-01 DIAGNOSIS — R739 Hyperglycemia, unspecified: Secondary | ICD-10-CM

## 2018-01-01 DIAGNOSIS — I10 Essential (primary) hypertension: Secondary | ICD-10-CM | POA: Diagnosis not present

## 2018-01-01 DIAGNOSIS — R222 Localized swelling, mass and lump, trunk: Secondary | ICD-10-CM

## 2018-01-01 DIAGNOSIS — C189 Malignant neoplasm of colon, unspecified: Secondary | ICD-10-CM | POA: Diagnosis not present

## 2018-01-01 DIAGNOSIS — L271 Localized skin eruption due to drugs and medicaments taken internally: Secondary | ICD-10-CM

## 2018-01-01 DIAGNOSIS — Z79899 Other long term (current) drug therapy: Secondary | ICD-10-CM | POA: Diagnosis not present

## 2018-01-01 DIAGNOSIS — K589 Irritable bowel syndrome without diarrhea: Secondary | ICD-10-CM | POA: Diagnosis not present

## 2018-01-01 DIAGNOSIS — Z5111 Encounter for antineoplastic chemotherapy: Secondary | ICD-10-CM

## 2018-01-01 LAB — CBC WITH DIFFERENTIAL/PLATELET
BASOS ABS: 0 10*3/uL (ref 0–0.1)
Basophils Relative: 1 %
EOS PCT: 4 %
Eosinophils Absolute: 0.2 10*3/uL (ref 0–0.7)
HCT: 34.9 % — ABNORMAL LOW (ref 35.0–47.0)
HEMOGLOBIN: 11.6 g/dL — AB (ref 12.0–16.0)
LYMPHS PCT: 29 %
Lymphs Abs: 1.3 10*3/uL (ref 1.0–3.6)
MCH: 31.9 pg (ref 26.0–34.0)
MCHC: 33.3 g/dL (ref 32.0–36.0)
MCV: 95.8 fL (ref 80.0–100.0)
Monocytes Absolute: 0.4 10*3/uL (ref 0.2–0.9)
Monocytes Relative: 9 %
NEUTROS PCT: 57 %
Neutro Abs: 2.5 10*3/uL (ref 1.4–6.5)
PLATELETS: 179 10*3/uL (ref 150–440)
RBC: 3.65 MIL/uL — AB (ref 3.80–5.20)
RDW: 19 % — ABNORMAL HIGH (ref 11.5–14.5)
WBC: 4.4 10*3/uL (ref 3.6–11.0)

## 2018-01-01 LAB — COMPREHENSIVE METABOLIC PANEL
ALT: 18 U/L (ref 14–54)
AST: 31 U/L (ref 15–41)
Albumin: 3.6 g/dL (ref 3.5–5.0)
Alkaline Phosphatase: 126 U/L (ref 38–126)
Anion gap: 8 (ref 5–15)
BILIRUBIN TOTAL: 0.8 mg/dL (ref 0.3–1.2)
BUN: 17 mg/dL (ref 6–20)
CHLORIDE: 109 mmol/L (ref 101–111)
CO2: 21 mmol/L — ABNORMAL LOW (ref 22–32)
Calcium: 9.4 mg/dL (ref 8.9–10.3)
Creatinine, Ser: 0.9 mg/dL (ref 0.44–1.00)
Glucose, Bld: 137 mg/dL — ABNORMAL HIGH (ref 65–99)
POTASSIUM: 3.7 mmol/L (ref 3.5–5.1)
Sodium: 138 mmol/L (ref 135–145)
Total Protein: 6.7 g/dL (ref 6.5–8.1)

## 2018-01-01 MED ORDER — SODIUM CHLORIDE 0.9 % IV SOLN
1920.0000 mg/m2 | INTRAVENOUS | Status: DC
  Administered 2018-01-01: 4150 mg via INTRAVENOUS
  Filled 2018-01-01: qty 83

## 2018-01-01 MED ORDER — LEUCOVORIN CALCIUM INJECTION 350 MG: 400.0000 mg/m2 | Freq: Once | INTRAVENOUS | Status: DC

## 2018-01-01 MED ORDER — PALONOSETRON HCL INJECTION 0.25 MG/5ML
0.2500 mg | Freq: Once | INTRAVENOUS | Status: AC
  Administered 2018-01-01: 0.25 mg via INTRAVENOUS
  Filled 2018-01-01: qty 5

## 2018-01-01 MED ORDER — SODIUM CHLORIDE 0.9% FLUSH
10.0000 mL | INTRAVENOUS | Status: DC | PRN
  Administered 2018-01-01: 10 mL via INTRAVENOUS
  Filled 2018-01-01: qty 10

## 2018-01-01 MED ORDER — LEUCOVORIN CALCIUM INJECTION 100 MG
42.0000 mg | Freq: Once | INTRAMUSCULAR | Status: AC
  Administered 2018-01-01: 42 mg via INTRAVENOUS
  Filled 2018-01-01: qty 2.1

## 2018-01-01 MED ORDER — FLUOROURACIL CHEMO INJECTION 2.5 GM/50ML
320.0000 mg/m2 | Freq: Once | INTRAVENOUS | Status: AC
  Administered 2018-01-01: 700 mg via INTRAVENOUS
  Filled 2018-01-01: qty 14

## 2018-01-01 MED ORDER — DEXTROSE 5 % IV SOLN
Freq: Once | INTRAVENOUS | Status: AC
  Administered 2018-01-01: 11:00:00 via INTRAVENOUS
  Filled 2018-01-01: qty 1000

## 2018-01-01 NOTE — Progress Notes (Signed)
No new changes noted today 

## 2018-01-03 ENCOUNTER — Inpatient Hospital Stay: Payer: Medicare HMO

## 2018-01-03 VITALS — BP 121/75 | HR 60 | Temp 96.7°F | Resp 18

## 2018-01-03 DIAGNOSIS — D492 Neoplasm of unspecified behavior of bone, soft tissue, and skin: Secondary | ICD-10-CM | POA: Diagnosis not present

## 2018-01-03 DIAGNOSIS — R739 Hyperglycemia, unspecified: Secondary | ICD-10-CM | POA: Diagnosis not present

## 2018-01-03 DIAGNOSIS — C186 Malignant neoplasm of descending colon: Secondary | ICD-10-CM | POA: Diagnosis not present

## 2018-01-03 DIAGNOSIS — K219 Gastro-esophageal reflux disease without esophagitis: Secondary | ICD-10-CM | POA: Diagnosis not present

## 2018-01-03 DIAGNOSIS — I10 Essential (primary) hypertension: Secondary | ICD-10-CM | POA: Diagnosis not present

## 2018-01-03 DIAGNOSIS — L271 Localized skin eruption due to drugs and medicaments taken internally: Secondary | ICD-10-CM | POA: Diagnosis not present

## 2018-01-03 DIAGNOSIS — K589 Irritable bowel syndrome without diarrhea: Secondary | ICD-10-CM | POA: Diagnosis not present

## 2018-01-03 DIAGNOSIS — Z5111 Encounter for antineoplastic chemotherapy: Secondary | ICD-10-CM | POA: Diagnosis not present

## 2018-01-03 DIAGNOSIS — R222 Localized swelling, mass and lump, trunk: Secondary | ICD-10-CM | POA: Diagnosis not present

## 2018-01-03 MED ORDER — SODIUM CHLORIDE 0.9% FLUSH
10.0000 mL | INTRAVENOUS | Status: DC | PRN
  Administered 2018-01-03: 10 mL
  Filled 2018-01-03: qty 10

## 2018-01-03 MED ORDER — HEPARIN SOD (PORK) LOCK FLUSH 100 UNIT/ML IV SOLN
500.0000 [IU] | Freq: Once | INTRAVENOUS | Status: AC | PRN
  Administered 2018-01-03: 500 [IU]

## 2018-01-03 MED ORDER — HEPARIN SOD (PORK) LOCK FLUSH 100 UNIT/ML IV SOLN
INTRAVENOUS | Status: AC
  Filled 2018-01-03: qty 5

## 2018-01-12 ENCOUNTER — Other Ambulatory Visit: Payer: Self-pay

## 2018-01-12 ENCOUNTER — Encounter: Payer: Self-pay | Admitting: Radiation Oncology

## 2018-01-12 ENCOUNTER — Other Ambulatory Visit: Payer: Self-pay | Admitting: *Deleted

## 2018-01-12 ENCOUNTER — Ambulatory Visit
Admission: RE | Admit: 2018-01-12 | Discharge: 2018-01-12 | Disposition: A | Discharge status: Home / Self Care | Payer: Medicare HMO | Source: Ambulatory Visit | Attending: Radiation Oncology | Admitting: Radiation Oncology

## 2018-01-12 VITALS — BP 114/59 | HR 54 | Temp 96.7°F | Resp 18 | Wt 205.2 lb

## 2018-01-12 DIAGNOSIS — Z9889 Other specified postprocedural states: Secondary | ICD-10-CM | POA: Insufficient documentation

## 2018-01-12 DIAGNOSIS — C187 Malignant neoplasm of sigmoid colon: Secondary | ICD-10-CM

## 2018-01-12 DIAGNOSIS — Z9221 Personal history of antineoplastic chemotherapy: Secondary | ICD-10-CM | POA: Insufficient documentation

## 2018-01-12 DIAGNOSIS — K769 Liver disease, unspecified: Secondary | ICD-10-CM | POA: Insufficient documentation

## 2018-01-12 DIAGNOSIS — C186 Malignant neoplasm of descending colon: Secondary | ICD-10-CM

## 2018-01-12 DIAGNOSIS — C20 Malignant neoplasm of rectum: Secondary | ICD-10-CM | POA: Diagnosis not present

## 2018-01-12 DIAGNOSIS — R197 Diarrhea, unspecified: Secondary | ICD-10-CM | POA: Diagnosis not present

## 2018-01-12 NOTE — Progress Notes (Signed)
Radiation Oncology Follow up Note  Name: [name]   Date:   01/12/2018 MRN:  [id number] DOB: [birth year]    This 77 y.o. female presents to the clinic today for reevaluation status post chemotherapy for stage IIIB locally advanced rectal cancer status post robotic colectomy in patient with prior history of endometrial carcinoma receiving vaginal brachytherapy. Tumor stages pathologic T4 1 N1 cM0  REFERRING PROVIDER: Burnard Hawthorne, FNP  HPI: Patient is a 77 year old female originally consult back in October 2018 when she had undergone a. Robotic partial colectomy for a large rectal cancer 20 cm from the anal verge biopsy-positive moderately differentiated adenocarcinoma with lymphovascular space invasion. There were tumor nodules present tumor measured 5.5 cm and invaded the visceral peritoneum. Resection margins were less than 1 mm from the peritoneal surface. Tumor was adherent to the pelvic sidewall. She is now completing 12 cycles of 5-FU. She has done well has some minor intermittent diarrhea. Bowels are functioning well she specifically denies abdominal pain. She's now referred to radiation oncology for completion of her treatment plan with pelvic radiation.  COMPLICATIONS OF TREATMENT: none  FOLLOW UP COMPLIANCE: keeps appointments   PHYSICAL EXAM:  BP (!) 114/59   Pulse (!) 54   Temp (!) 96.7 F (35.9 C)   Resp 18   Wt 205 lb 4 oz (93.1 kg)   BMI 34.16 kg/m  Well-developed well-nourished patient in NAD. HEENT reveals PERLA, EOMI, discs not visualized.  Oral cavity is clear. No oral mucosal lesions are identified. Neck is clear without evidence of cervical or supraclavicular adenopathy. Lungs are clear to A&P. Cardiac examination is essentially unremarkable with regular rate and rhythm without murmur rub or thrill. Abdomen is benign with no organomegaly or masses noted. Motor sensory and DTR levels are equal and symmetric in the upper and lower extremities. Cranial nerves II  through XII are grossly intact. Proprioception is intact. No peripheral adenopathy or edema is identified. No motor or sensory levels are noted. Crude visual fields are within normal range.  RADIOLOGY RESULTS: Previous CT scan reviewed repeat CT scan of abdomen and pelvis with contrast ordered  PLAN: At this time I'm ordering CT scan of her abdomen pelvis for consideration of possible ruling out liver metastasis as well as any gross persistent disease in her pelvis. I would plan on delivering 4500 cGy to her pelvis which I believe is safe even after vaginal brachytherapy back in May 2014. I personally set up and ordered CT simulation in about 2 weeks' time.There will be extra effort by both professional staff as well as technical staff to coordinate and manage concurrent chemoradiation and ensuing side effects during her treatments. Risks and benefits of treatment including increased possibility of diarrhea increased lower urinary tract symptoms fatigue skin reaction alteration of blood counts all were discussed in detail. I will discuss with medical oncology possible ability of low-dose 5-FU as a radiation sensitizer along with Mic-[employer] radiation. Patient seems to comprehend my treatment plan well. CT scan was ordered. Patient is to call with any concerns.  I would like to take this opportunity to thank you for allowing me to participate in the care of your patient.Noreene Filbert, MD

## 2018-01-23 ENCOUNTER — Ambulatory Visit
Admission: RE | Admit: 2018-01-23 | Discharge: 2018-01-23 | Disposition: A | Discharge status: Home / Self Care | Payer: Medicare HMO | Source: Ambulatory Visit | Attending: Radiation Oncology | Admitting: Radiation Oncology

## 2018-01-23 DIAGNOSIS — C189 Malignant neoplasm of colon, unspecified: Secondary | ICD-10-CM | POA: Diagnosis not present

## 2018-01-23 DIAGNOSIS — Z08 Encounter for follow-up examination after completed treatment for malignant neoplasm: Secondary | ICD-10-CM | POA: Diagnosis not present

## 2018-01-23 DIAGNOSIS — Z9049 Acquired absence of other specified parts of digestive tract: Secondary | ICD-10-CM | POA: Diagnosis not present

## 2018-01-23 DIAGNOSIS — I700 Atherosclerosis of aorta: Secondary | ICD-10-CM | POA: Insufficient documentation

## 2018-01-23 DIAGNOSIS — C186 Malignant neoplasm of descending colon: Secondary | ICD-10-CM | POA: Diagnosis not present

## 2018-01-23 MED ORDER — IOPAMIDOL (ISOVUE-300) INJECTION 61%
100.0000 mL | Freq: Once | INTRAVENOUS | Status: AC | PRN
  Administered 2018-01-23: 100 mL via INTRAVENOUS

## 2018-01-24 ENCOUNTER — Inpatient Hospital Stay: Payer: Medicare HMO

## 2018-01-29 ENCOUNTER — Ambulatory Visit
Admission: RE | Admit: 2018-01-29 | Discharge: 2018-01-29 | Disposition: A | Discharge status: Home / Self Care | Payer: Medicare HMO | Source: Ambulatory Visit | Attending: Radiation Oncology | Admitting: Radiation Oncology

## 2018-01-29 ENCOUNTER — Encounter: Payer: Self-pay | Admitting: Oncology

## 2018-01-29 ENCOUNTER — Other Ambulatory Visit: Payer: Self-pay

## 2018-01-29 ENCOUNTER — Inpatient Hospital Stay: Payer: Medicare HMO | Attending: Oncology | Admitting: Oncology

## 2018-01-29 ENCOUNTER — Inpatient Hospital Stay: Payer: Medicare HMO

## 2018-01-29 VITALS — BP 116/71 | HR 57 | Temp 97.3°F | Resp 12 | Ht 65.0 in | Wt 210.0 lb

## 2018-01-29 DIAGNOSIS — K581 Irritable bowel syndrome with constipation: Secondary | ICD-10-CM | POA: Insufficient documentation

## 2018-01-29 DIAGNOSIS — C19 Malignant neoplasm of rectosigmoid junction: Secondary | ICD-10-CM | POA: Diagnosis not present

## 2018-01-29 DIAGNOSIS — C186 Malignant neoplasm of descending colon: Secondary | ICD-10-CM | POA: Insufficient documentation

## 2018-01-29 DIAGNOSIS — Z800 Family history of malignant neoplasm of digestive organs: Secondary | ICD-10-CM | POA: Insufficient documentation

## 2018-01-29 DIAGNOSIS — Z79899 Other long term (current) drug therapy: Secondary | ICD-10-CM | POA: Diagnosis not present

## 2018-01-29 DIAGNOSIS — D492 Neoplasm of unspecified behavior of bone, soft tissue, and skin: Secondary | ICD-10-CM | POA: Diagnosis not present

## 2018-01-29 DIAGNOSIS — K219 Gastro-esophageal reflux disease without esophagitis: Secondary | ICD-10-CM | POA: Diagnosis not present

## 2018-01-29 DIAGNOSIS — I10 Essential (primary) hypertension: Secondary | ICD-10-CM | POA: Insufficient documentation

## 2018-01-29 DIAGNOSIS — C187 Malignant neoplasm of sigmoid colon: Secondary | ICD-10-CM

## 2018-01-29 DIAGNOSIS — E559 Vitamin D deficiency, unspecified: Secondary | ICD-10-CM | POA: Diagnosis not present

## 2018-01-29 DIAGNOSIS — Z510 Encounter for antineoplastic radiation therapy: Secondary | ICD-10-CM | POA: Diagnosis not present

## 2018-01-29 DIAGNOSIS — C20 Malignant neoplasm of rectum: Secondary | ICD-10-CM | POA: Diagnosis not present

## 2018-01-29 LAB — CBC WITH DIFFERENTIAL/PLATELET
BASOS PCT: 1 %
Basophils Absolute: 0 10*3/uL (ref 0–0.1)
EOS ABS: 0.2 10*3/uL (ref 0–0.7)
EOS PCT: 3 %
HEMATOCRIT: 35.5 % (ref 35.0–47.0)
Hemoglobin: 11.9 g/dL — ABNORMAL LOW (ref 12.0–16.0)
Lymphocytes Relative: 29 %
Lymphs Abs: 1.7 10*3/uL (ref 1.0–3.6)
MCH: 32 pg (ref 26.0–34.0)
MCHC: 33.5 g/dL (ref 32.0–36.0)
MCV: 95.6 fL (ref 80.0–100.0)
MONO ABS: 0.3 10*3/uL (ref 0.2–0.9)
MONOS PCT: 5 %
Neutro Abs: 3.8 10*3/uL (ref 1.4–6.5)
Neutrophils Relative %: 62 %
PLATELETS: 207 10*3/uL (ref 150–440)
RBC: 3.71 MIL/uL — ABNORMAL LOW (ref 3.80–5.20)
RDW: 17.2 % — ABNORMAL HIGH (ref 11.5–14.5)
WBC: 6.1 10*3/uL (ref 3.6–11.0)

## 2018-01-29 LAB — COMPREHENSIVE METABOLIC PANEL
ALBUMIN: 3.5 g/dL (ref 3.5–5.0)
ALK PHOS: 123 U/L (ref 38–126)
ALT: 21 U/L (ref 14–54)
AST: 27 U/L (ref 15–41)
Anion gap: 8 (ref 5–15)
BILIRUBIN TOTAL: 0.5 mg/dL (ref 0.3–1.2)
BUN: 19 mg/dL (ref 6–20)
CALCIUM: 9 mg/dL (ref 8.9–10.3)
CO2: 22 mmol/L (ref 22–32)
CREATININE: 0.82 mg/dL (ref 0.44–1.00)
Chloride: 109 mmol/L (ref 101–111)
GFR calc Af Amer: >60 mL/min (ref >60)
GFR calc non Af Amer: >60 mL/min (ref >60)
GLUCOSE: 162 mg/dL — AB (ref 65–99)
Potassium: 3.6 mmol/L (ref 3.5–5.1)
SODIUM: 139 mmol/L (ref 135–145)
Total Protein: 6.8 g/dL (ref 6.5–8.1)

## 2018-01-29 NOTE — Progress Notes (Signed)
Patient here for follow up no changes since her last appointment. 

## 2018-01-29 NOTE — Progress Notes (Signed)
Hematology/Oncology Follow Up visit. [place] [phone] [phone]  CONSULT NOTE Patient Care Team: Burnard Hawthorne, FNP as PCP - General (Family Medicine) Vladimir Crofts, MD (Neurology) Noreene Filbert, MD as Referring Physician (Radiation Oncology)  PURPOSE OF CONSULTATION:  Follow up for treatment of Colon cancer  HISTORY OF PRESENTING ILLNESS:  [name] 77 y.o.  female with PMH listed as below was referred to me for evaluation and management of colon cancer.  She had colonoscopy evaluation due to positive cologuard.  She was found to have a nonobstructing mass approximately 20 cm from the anal verge which biopsy proved to be adenocarcinoma. CT scan showed a large mass from the sigmoid and extends beyond the wall of the colon toward the left into the surrounding fat. Preoperational CEA was normal. Patient underwent robotic sigmoidectomy on 06/07/2017.   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 MLH1  loss of function, PMS2 loss of function. Declined option of clinical trial ATOMIC She has a history of endometrial cancer s/p hysterotomy and radiation. She has a family history of colon cancer and breast cancer.  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 Results have been explained to patient by genetic counselor.   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   INTERVAL HISTORY 77 yo female with above history presents for follow up.  S/p adjuvant chemotherapy, currently on adjuvant Radiation.  Hand foot syndrome resolved. Patient reports doing well. No new complaints.     Review of Systems  Constitutional: Negative for appetite change, chills, fatigue and fever.  HENT:   Negative for hearing loss, lump/mass, mouth sores, nosebleeds and sore throat.   Eyes:         Dry eyes and runny eyes.  Respiratory: Negative for chest tightness, cough and shortness of breath.   Cardiovascular: Negative for chest pain and leg swelling.  Gastrointestinal: Negative for abdominal pain, blood in stool, constipation, diarrhea and nausea.  Endocrine: Negative for hot flashes.  Genitourinary: Negative for dysuria, hematuria, nocturia and pelvic pain.   Musculoskeletal: Negative for arthralgias.  Skin: Negative for itching, rash and wound.  Neurological: Negative for headaches and numbness.  Hematological: Negative for adenopathy.  Psychiatric/Behavioral: Negative for confusion. The patient is not nervous/anxious.    MEDICAL HISTORY:  Past Medical History:  Diagnosis Date  . Arthritis    bitateral knees and left foot,s/p cortisone injection in past  . Chronic constipation   . Colon cancer ([place]) 2018   pt is undergoing chemo 11-09-17  . Complication of anesthesia     slow to wake up   . Endometrial cancer ([place]) 2014   Total Hysterectomy and Rad tx's.   . Family history of cancer   . Genetic testing 09/26/2017    Multi-Cancer panel (83 genes) @ [facility] - No pathogenic mutations detected  . GERD (gastroesophageal reflux disease)   . Hypertension   . IBS (irritable bowel syndrome)   . Menopause    age 28  . PONV (postoperative nausea and vomiting)   . Vitamin D deficiency 06/07/10    SURGICAL HISTORY: Past Surgical History:  Procedure Laterality Date  . BUNIONECTOMY    . COLONOSCOPY WITH PROPOFOL N/A 04/04/2017   Procedure: COLONOSCOPY WITH PROPOFOL;  Surgeon: [name], MD;  Location: [facility] ENDOSCOPY;  Service: Endoscopy;  Laterality: N/A;  .  DILATION AND CURETTAGE OF UTERUS  1971  . FOOT SURGERY    . NASAL SINUS SURGERY    . PORTACATH PLACEMENT Right 07/12/2017   Procedure: INSERTION PORT-A-CATH;  Surgeon: Jules Husbands, MD;  Location: [facility] ORS;  Service: General;  Laterality: Right;  . ROBOTIC ASSISTED LAPAROSCOPIC VENTRAL/INCISIONAL HERNIA  REPAIR N/A 06/07/2017   Procedure: ROBOTIC INCISIONAL HERNIA REPAIR;  Surgeon: Leighton Ruff, MD;  Location: [facility] ORS;  Service: General;  Laterality: N/A;  . VAGINAL DELIVERY    . VAGINAL HYSTERECTOMY      SOCIAL HISTORY: Social History   Socioeconomic History  . Marital status: Divorced    Spouse name: Not on file  . Number of children: Not on file  . Years of education: Not on file  . Highest education level: Not on file  Social Needs  . Financial resource strain: Not on file  . Food insecurity - worry: Not on file  . Food insecurity - inability: Not on file  . Transportation needs - medical: Not on file  . Transportation needs - non-medical: Not on file  Occupational History  . Not on file  Tobacco Use  . Smoking status: Never Smoker  . Smokeless tobacco: Never Used  Substance and Sexual Activity  . Alcohol use: Yes    Comment: socially/rarely  . Drug use: No  . Sexual activity: No  Other Topics Concern  . Not on file  Social History Narrative   Exercise- aerobic exercise at home 3-4x week.       Lives in [place]. No pets.    FAMILY HISTORY: Family History  Problem Relation Age of Onset  . Heart disease Father 59       massive MI  . Diabetes Father   . Bipolar disorder Sister   . Osteoporosis Sister   . Breast cancer Sister 36       currently 48  . Cancer Sister        breast  . Bipolar disorder Maternal Aunt   . Breast cancer Maternal Aunt        age at dx unknown; deceased 75s  . Colon cancer Maternal Grandfather 39       deceased 55  . Cancer Maternal Grandfather        breast  . Osteoporosis Mother   . Colon cancer Mother        age at dx unknown; deceased 70  . Cancer Maternal Grandmother        unk. type; deceased 18  . Breast cancer Paternal Aunt        deceased 56    ALLERGIES:  is allergic to codeine.  MEDICATIONS:  Current Outpatient Medications  Medication Sig Dispense Refill  . chlorhexidine (PERIDEX) 0.12 % solution Use as  directed 15 mLs in the mouth or throat 2 (two) times daily. 473 mL 0  . fluticasone (FLONASE) 50 MCG/ACT nasal spray Place 2 sprays into both nostrils daily. 16 g 2  . lidocaine-prilocaine (EMLA) cream Apply 1 application topically as needed. 30 g 0  . losartan (COZAAR) 50 MG tablet Take 1 tablet (50 mg total) by mouth daily. 90 tablet 3  . Magnesium 400 MG TABS Take 400 mg by mouth daily as needed (irritable bowel syndrome).    . metoprolol succinate (TOPROL-XL) 100 MG 24 hr tablet Take 1 tablet (100 mg total) by mouth daily. 90 tablet 3  . Multiple Vitamin (MULTIVITAMIN WITH MINERALS) TABS tablet Take 1 tablet by mouth daily.    Marland Kitchen  Multiple Vitamins-Minerals (ICAPS AREDS 2 PO) Take 2 capsules by mouth daily.    . ondansetron (ZOFRAN) 8 MG tablet Take 1 tablet (8 mg total) by mouth 2 (two) times daily as needed for refractory nausea / vomiting. Start on day 3 after chemotherapy. 30 tablet 1  . Polyethylene Glycol 3350 (MIRALAX PO) Take 17 g by mouth daily as needed (constipation).     . prochlorperazine (COMPAZINE) 10 MG tablet Take 1 tablet (10 mg total) by mouth every 6 (six) hours as needed (Nausea or vomiting). 30 tablet 1  . simvastatin (ZOCOR) 20 MG tablet Take 1 tablet (20 mg total) by mouth every evening. 90 tablet 4  . topiramate (TOPAMAX) 50 MG tablet Take 1 tablet (50 mg total) by mouth 2 (two) times daily. 180 tablet 0   No current facility-administered medications for this visit.    Facility-Administered Medications Ordered in Other Visits  Medication Dose Route Frequency Provider Last Rate Last Dose  . sodium chloride flush (NS) 0.9 % injection 10 mL  10 mL Intracatheter PRN Earlie Server, MD   10 mL at 11/08/17 1400      .  PHYSICAL EXAMINATION: ECOG PERFORMANCE STATUS: 1 - Symptomatic but completely ambulatory Vitals:   01/29/18 1453 01/29/18 1455  BP:  116/71  Pulse:  (!) 57  Resp: 12   Temp:  (!) 97.3 F (36.3 C)   Filed Weights   01/29/18 1453  Weight: 210 lb (95.3  kg)   Physical Exam  Constitutional: No distress.  HENT:  Head: Normocephalic.  Mouth/Throat: No oropharyngeal exudate.  Eyes: EOM are normal. Pupils are equal, round, and reactive to light.  Neck: Normal range of motion. Neck supple.  Cardiovascular: Normal rate and regular rhythm.  No murmur heard. Pulmonary/Chest: Effort normal and breath sounds normal. No stridor. She has no wheezes.  Abdominal: Soft. There is no tenderness. There is no guarding.  Musculoskeletal: She exhibits no edema.  Lymphadenopathy:    She has no cervical adenopathy.  Neurological: No cranial nerve deficit. Coordination normal.  Skin: Skin is warm and dry.  Psychiatric: She has a normal mood and affect.    LABORATORY DATA:  I have reviewed the data as listed Lab Results  Component Value Date   WBC 6.1 01/29/2018   HGB 11.9 (L) 01/29/2018   HCT 35.5 01/29/2018   MCV 95.6 01/29/2018   PLT 207 01/29/2018   Recent Labs    12/18/17 1008 01/01/18 0919 01/29/18 1437  NA 137 138 139  K 3.8 3.7 3.6  CL 107 109 109  CO2 21* 21* 22  GLUCOSE 142* 137* 162*  BUN 22* 17 19  CREATININE 0.95 0.90 0.82  CALCIUM 9.2 9.4 9.0  GFRNONAA 57* >60 >60  GFRAA >60 >60 >60  PROT 6.9 6.7 6.8  ALBUMIN 3.7 3.6 3.5  AST '28 31 27  ' ALT '17 18 21  ' ALKPHOS 117 126 123  BILITOT 0.7 0.8 0.5    RADIOGRAPHIC STUDIES: I have personally reviewed the radiological images as listed and agreed with the findings in the report. 04/12/2017 CT abdomen pelvis w contrast Large mass arising from the sigmoid colon. This mass extends beyond the wall of the colon toward the left into the surrounding fat. This lesion does not reach the left pelvic sidewall.No adenopathy or liver lesions evident. No omental lesions appreciable. Sizable midline ventral hernia slightly superior to theumbilicus containing fat and a loop of colon without bowel compromise. Much smaller midline umbilical ventral hernia containingonly fat. There  is a small hiatal  hernia. No bowel obstruction.  No abscess.  Appendix region appears normal. No renal or ureteral calculus.  No hydronephrosis.There is aortoiliac atherosclerosis. There are foci of coronary artery calcification.    ASSESSMENT & PLAN:  77 y.o.female who has Stage IIIB colon cancer.  Cancer Staging Cancer of descending colon [facility]) Staging form: Colon and Rectum, AJCC 8th Edition - Clinical stage from 07/06/2017: Stage IIIB (cT4a, cN1c, cM0) - Unsigned  1. Cancer of descending colon ([place])    #  T4 tumor, continue follow up with Radonc and finish her RT tretment.  Recent CT results discussed with patient. No evidence of disease recurrence.  CT every 6 months for first 2 year and annually for first 5 years.  Follow up in 3 months for repeat cbc, cmp,cea.  All questions were answered. The patient knows to call the clinic with any problems questions or concerns.  Return of visit: 3 months.   Earlie Server, MD, PhD Hematology Oncology [facility] Pager- [phone] 01/29/2018

## 2018-01-31 DIAGNOSIS — Z800 Family history of malignant neoplasm of digestive organs: Secondary | ICD-10-CM | POA: Diagnosis not present

## 2018-01-31 DIAGNOSIS — C20 Malignant neoplasm of rectum: Secondary | ICD-10-CM | POA: Diagnosis not present

## 2018-01-31 DIAGNOSIS — Z510 Encounter for antineoplastic radiation therapy: Secondary | ICD-10-CM | POA: Diagnosis not present

## 2018-01-31 DIAGNOSIS — C19 Malignant neoplasm of rectosigmoid junction: Secondary | ICD-10-CM | POA: Diagnosis not present

## 2018-02-02 ENCOUNTER — Other Ambulatory Visit: Payer: Self-pay | Admitting: *Deleted

## 2018-02-02 DIAGNOSIS — C187 Malignant neoplasm of sigmoid colon: Secondary | ICD-10-CM

## 2018-02-05 ENCOUNTER — Ambulatory Visit
Admission: RE | Admit: 2018-02-05 | Discharge: 2018-02-05 | Disposition: A | Discharge status: Home / Self Care | Payer: Medicare HMO | Source: Ambulatory Visit | Attending: Radiation Oncology | Admitting: Radiation Oncology

## 2018-02-05 DIAGNOSIS — C20 Malignant neoplasm of rectum: Secondary | ICD-10-CM | POA: Diagnosis not present

## 2018-02-05 DIAGNOSIS — Z510 Encounter for antineoplastic radiation therapy: Secondary | ICD-10-CM | POA: Diagnosis not present

## 2018-02-05 DIAGNOSIS — C19 Malignant neoplasm of rectosigmoid junction: Secondary | ICD-10-CM | POA: Diagnosis not present

## 2018-02-05 DIAGNOSIS — Z800 Family history of malignant neoplasm of digestive organs: Secondary | ICD-10-CM | POA: Diagnosis not present

## 2018-02-06 ENCOUNTER — Ambulatory Visit
Admission: RE | Admit: 2018-02-06 | Discharge: 2018-02-06 | Disposition: A | Discharge status: Home / Self Care | Payer: Medicare HMO | Source: Ambulatory Visit | Attending: Radiation Oncology | Admitting: Radiation Oncology

## 2018-02-06 DIAGNOSIS — C20 Malignant neoplasm of rectum: Secondary | ICD-10-CM | POA: Diagnosis not present

## 2018-02-06 DIAGNOSIS — C19 Malignant neoplasm of rectosigmoid junction: Secondary | ICD-10-CM | POA: Diagnosis not present

## 2018-02-06 DIAGNOSIS — Z800 Family history of malignant neoplasm of digestive organs: Secondary | ICD-10-CM | POA: Diagnosis not present

## 2018-02-06 DIAGNOSIS — Z510 Encounter for antineoplastic radiation therapy: Secondary | ICD-10-CM | POA: Diagnosis not present

## 2018-02-07 ENCOUNTER — Ambulatory Visit
Admission: RE | Admit: 2018-02-07 | Discharge: 2018-02-07 | Disposition: A | Discharge status: Home / Self Care | Payer: Medicare HMO | Source: Ambulatory Visit | Attending: Radiation Oncology | Admitting: Radiation Oncology

## 2018-02-07 DIAGNOSIS — Z800 Family history of malignant neoplasm of digestive organs: Secondary | ICD-10-CM | POA: Diagnosis not present

## 2018-02-07 DIAGNOSIS — Z510 Encounter for antineoplastic radiation therapy: Secondary | ICD-10-CM | POA: Diagnosis not present

## 2018-02-07 DIAGNOSIS — C19 Malignant neoplasm of rectosigmoid junction: Secondary | ICD-10-CM | POA: Diagnosis not present

## 2018-02-07 DIAGNOSIS — C20 Malignant neoplasm of rectum: Secondary | ICD-10-CM | POA: Diagnosis not present

## 2018-02-08 ENCOUNTER — Ambulatory Visit: Admission: RE | Admit: 2018-02-08 | Payer: Medicare HMO | Source: Ambulatory Visit

## 2018-02-09 ENCOUNTER — Ambulatory Visit
Admission: RE | Admit: 2018-02-09 | Discharge: 2018-02-09 | Disposition: A | Discharge status: Home / Self Care | Payer: Medicare HMO | Source: Ambulatory Visit | Attending: Radiation Oncology | Admitting: Radiation Oncology

## 2018-02-09 DIAGNOSIS — Z510 Encounter for antineoplastic radiation therapy: Secondary | ICD-10-CM | POA: Diagnosis not present

## 2018-02-09 DIAGNOSIS — C19 Malignant neoplasm of rectosigmoid junction: Secondary | ICD-10-CM | POA: Diagnosis not present

## 2018-02-09 DIAGNOSIS — C20 Malignant neoplasm of rectum: Secondary | ICD-10-CM | POA: Diagnosis not present

## 2018-02-09 DIAGNOSIS — Z800 Family history of malignant neoplasm of digestive organs: Secondary | ICD-10-CM | POA: Diagnosis not present

## 2018-02-12 ENCOUNTER — Inpatient Hospital Stay: Payer: Medicare HMO | Attending: Oncology

## 2018-02-12 ENCOUNTER — Inpatient Hospital Stay: Payer: Medicare HMO

## 2018-02-12 ENCOUNTER — Ambulatory Visit
Admission: RE | Admit: 2018-02-12 | Discharge: 2018-02-12 | Disposition: A | Discharge status: Home / Self Care | Payer: Medicare HMO | Source: Ambulatory Visit | Attending: Radiation Oncology | Admitting: Radiation Oncology

## 2018-02-12 DIAGNOSIS — C180 Malignant neoplasm of cecum: Secondary | ICD-10-CM | POA: Insufficient documentation

## 2018-02-12 DIAGNOSIS — I10 Essential (primary) hypertension: Secondary | ICD-10-CM | POA: Insufficient documentation

## 2018-02-12 DIAGNOSIS — D492 Neoplasm of unspecified behavior of bone, soft tissue, and skin: Secondary | ICD-10-CM | POA: Diagnosis not present

## 2018-02-12 DIAGNOSIS — K581 Irritable bowel syndrome with constipation: Secondary | ICD-10-CM | POA: Insufficient documentation

## 2018-02-12 DIAGNOSIS — C20 Malignant neoplasm of rectum: Secondary | ICD-10-CM | POA: Diagnosis not present

## 2018-02-12 DIAGNOSIS — C186 Malignant neoplasm of descending colon: Secondary | ICD-10-CM | POA: Diagnosis not present

## 2018-02-12 DIAGNOSIS — Z95828 Presence of other vascular implants and grafts: Secondary | ICD-10-CM

## 2018-02-12 DIAGNOSIS — E559 Vitamin D deficiency, unspecified: Secondary | ICD-10-CM | POA: Diagnosis not present

## 2018-02-12 DIAGNOSIS — Z79899 Other long term (current) drug therapy: Secondary | ICD-10-CM | POA: Insufficient documentation

## 2018-02-12 DIAGNOSIS — Z510 Encounter for antineoplastic radiation therapy: Secondary | ICD-10-CM | POA: Diagnosis not present

## 2018-02-12 DIAGNOSIS — C187 Malignant neoplasm of sigmoid colon: Secondary | ICD-10-CM

## 2018-02-12 DIAGNOSIS — K219 Gastro-esophageal reflux disease without esophagitis: Secondary | ICD-10-CM | POA: Insufficient documentation

## 2018-02-12 LAB — CBC
HCT: 34.5 % — ABNORMAL LOW (ref 35.0–47.0)
HEMOGLOBIN: 11.5 g/dL — AB (ref 12.0–16.0)
MCH: 31.8 pg (ref 26.0–34.0)
MCHC: 33.4 g/dL (ref 32.0–36.0)
MCV: 95.2 fL (ref 80.0–100.0)
PLATELETS: 183 10*3/uL (ref 150–440)
RBC: 3.62 MIL/uL — AB (ref 3.80–5.20)
RDW: 15.9 % — ABNORMAL HIGH (ref 11.5–14.5)
WBC: 4.2 10*3/uL (ref 3.6–11.0)

## 2018-02-12 MED ORDER — SODIUM CHLORIDE 0.9% FLUSH
10.0000 mL | INTRAVENOUS | Status: AC | PRN
  Administered 2018-02-12: 10 mL
  Filled 2018-02-12: qty 10

## 2018-02-12 MED ORDER — HEPARIN SOD (PORK) LOCK FLUSH 100 UNIT/ML IV SOLN
500.0000 [IU] | INTRAVENOUS | Status: AC | PRN
  Administered 2018-02-12: 500 [IU]

## 2018-02-13 ENCOUNTER — Ambulatory Visit
Admission: RE | Admit: 2018-02-13 | Discharge: 2018-02-13 | Disposition: A | Discharge status: Home / Self Care | Payer: Medicare HMO | Source: Ambulatory Visit | Attending: Radiation Oncology | Admitting: Radiation Oncology

## 2018-02-13 DIAGNOSIS — C20 Malignant neoplasm of rectum: Secondary | ICD-10-CM | POA: Diagnosis not present

## 2018-02-13 DIAGNOSIS — Z510 Encounter for antineoplastic radiation therapy: Secondary | ICD-10-CM | POA: Diagnosis not present

## 2018-02-13 DIAGNOSIS — C180 Malignant neoplasm of cecum: Secondary | ICD-10-CM | POA: Diagnosis not present

## 2018-02-14 ENCOUNTER — Ambulatory Visit
Admission: RE | Admit: 2018-02-14 | Discharge: 2018-02-14 | Disposition: A | Discharge status: Home / Self Care | Payer: Medicare HMO | Source: Ambulatory Visit | Attending: Radiation Oncology | Admitting: Radiation Oncology

## 2018-02-14 DIAGNOSIS — C20 Malignant neoplasm of rectum: Secondary | ICD-10-CM | POA: Diagnosis not present

## 2018-02-14 DIAGNOSIS — C180 Malignant neoplasm of cecum: Secondary | ICD-10-CM | POA: Diagnosis not present

## 2018-02-14 DIAGNOSIS — Z510 Encounter for antineoplastic radiation therapy: Secondary | ICD-10-CM | POA: Diagnosis not present

## 2018-02-15 ENCOUNTER — Ambulatory Visit
Admission: RE | Admit: 2018-02-15 | Discharge: 2018-02-15 | Disposition: A | Discharge status: Home / Self Care | Payer: Medicare HMO | Source: Ambulatory Visit | Attending: Radiation Oncology | Admitting: Radiation Oncology

## 2018-02-15 DIAGNOSIS — C180 Malignant neoplasm of cecum: Secondary | ICD-10-CM | POA: Diagnosis not present

## 2018-02-15 DIAGNOSIS — C20 Malignant neoplasm of rectum: Secondary | ICD-10-CM | POA: Diagnosis not present

## 2018-02-15 DIAGNOSIS — Z510 Encounter for antineoplastic radiation therapy: Secondary | ICD-10-CM | POA: Diagnosis not present

## 2018-02-16 ENCOUNTER — Ambulatory Visit
Admission: RE | Admit: 2018-02-16 | Discharge: 2018-02-16 | Disposition: A | Discharge status: Home / Self Care | Payer: Medicare HMO | Source: Ambulatory Visit | Attending: Radiation Oncology | Admitting: Radiation Oncology

## 2018-02-16 DIAGNOSIS — C180 Malignant neoplasm of cecum: Secondary | ICD-10-CM | POA: Diagnosis not present

## 2018-02-16 DIAGNOSIS — C20 Malignant neoplasm of rectum: Secondary | ICD-10-CM | POA: Diagnosis not present

## 2018-02-16 DIAGNOSIS — Z510 Encounter for antineoplastic radiation therapy: Secondary | ICD-10-CM | POA: Diagnosis not present

## 2018-02-19 ENCOUNTER — Inpatient Hospital Stay: Payer: Medicare HMO

## 2018-02-19 ENCOUNTER — Ambulatory Visit
Admission: RE | Admit: 2018-02-19 | Discharge: 2018-02-19 | Disposition: A | Discharge status: Home / Self Care | Payer: Medicare HMO | Source: Ambulatory Visit | Attending: Radiation Oncology | Admitting: Radiation Oncology

## 2018-02-19 DIAGNOSIS — Z510 Encounter for antineoplastic radiation therapy: Secondary | ICD-10-CM | POA: Diagnosis not present

## 2018-02-19 DIAGNOSIS — C20 Malignant neoplasm of rectum: Secondary | ICD-10-CM | POA: Diagnosis not present

## 2018-02-19 DIAGNOSIS — C180 Malignant neoplasm of cecum: Secondary | ICD-10-CM | POA: Diagnosis not present

## 2018-02-20 ENCOUNTER — Ambulatory Visit
Admission: RE | Admit: 2018-02-20 | Discharge: 2018-02-20 | Disposition: A | Discharge status: Home / Self Care | Payer: Medicare HMO | Source: Ambulatory Visit | Attending: Radiation Oncology | Admitting: Radiation Oncology

## 2018-02-20 DIAGNOSIS — C20 Malignant neoplasm of rectum: Secondary | ICD-10-CM | POA: Diagnosis not present

## 2018-02-20 DIAGNOSIS — C180 Malignant neoplasm of cecum: Secondary | ICD-10-CM | POA: Diagnosis not present

## 2018-02-20 DIAGNOSIS — Z510 Encounter for antineoplastic radiation therapy: Secondary | ICD-10-CM | POA: Diagnosis not present

## 2018-02-21 ENCOUNTER — Ambulatory Visit
Admission: RE | Admit: 2018-02-21 | Discharge: 2018-02-21 | Disposition: A | Discharge status: Home / Self Care | Payer: Medicare HMO | Source: Ambulatory Visit | Attending: Radiation Oncology | Admitting: Radiation Oncology

## 2018-02-21 DIAGNOSIS — Z510 Encounter for antineoplastic radiation therapy: Secondary | ICD-10-CM | POA: Diagnosis not present

## 2018-02-21 DIAGNOSIS — C20 Malignant neoplasm of rectum: Secondary | ICD-10-CM | POA: Diagnosis not present

## 2018-02-21 DIAGNOSIS — C180 Malignant neoplasm of cecum: Secondary | ICD-10-CM | POA: Diagnosis not present

## 2018-02-22 ENCOUNTER — Ambulatory Visit
Admission: RE | Admit: 2018-02-22 | Discharge: 2018-02-22 | Disposition: A | Discharge status: Home / Self Care | Payer: Medicare HMO | Source: Ambulatory Visit | Attending: Radiation Oncology | Admitting: Radiation Oncology

## 2018-02-22 ENCOUNTER — Other Ambulatory Visit: Payer: Self-pay | Admitting: Family

## 2018-02-22 DIAGNOSIS — C20 Malignant neoplasm of rectum: Secondary | ICD-10-CM | POA: Diagnosis not present

## 2018-02-22 DIAGNOSIS — C180 Malignant neoplasm of cecum: Secondary | ICD-10-CM | POA: Diagnosis not present

## 2018-02-22 DIAGNOSIS — Z510 Encounter for antineoplastic radiation therapy: Secondary | ICD-10-CM | POA: Diagnosis not present

## 2018-02-22 DIAGNOSIS — E785 Hyperlipidemia, unspecified: Secondary | ICD-10-CM

## 2018-02-23 ENCOUNTER — Ambulatory Visit
Admission: RE | Admit: 2018-02-23 | Discharge: 2018-02-23 | Disposition: A | Discharge status: Home / Self Care | Payer: Medicare HMO | Source: Ambulatory Visit | Attending: Radiation Oncology | Admitting: Radiation Oncology

## 2018-02-23 DIAGNOSIS — C20 Malignant neoplasm of rectum: Secondary | ICD-10-CM | POA: Diagnosis not present

## 2018-02-23 DIAGNOSIS — Z510 Encounter for antineoplastic radiation therapy: Secondary | ICD-10-CM | POA: Diagnosis not present

## 2018-02-23 DIAGNOSIS — C180 Malignant neoplasm of cecum: Secondary | ICD-10-CM | POA: Diagnosis not present

## 2018-02-26 ENCOUNTER — Ambulatory Visit
Admission: RE | Admit: 2018-02-26 | Discharge: 2018-02-26 | Disposition: A | Discharge status: Home / Self Care | Payer: Medicare HMO | Source: Ambulatory Visit | Attending: Radiation Oncology | Admitting: Radiation Oncology

## 2018-02-26 ENCOUNTER — Inpatient Hospital Stay: Payer: Medicare HMO

## 2018-02-26 DIAGNOSIS — C180 Malignant neoplasm of cecum: Secondary | ICD-10-CM | POA: Diagnosis not present

## 2018-02-26 DIAGNOSIS — Z510 Encounter for antineoplastic radiation therapy: Secondary | ICD-10-CM | POA: Diagnosis not present

## 2018-02-26 DIAGNOSIS — C20 Malignant neoplasm of rectum: Secondary | ICD-10-CM | POA: Diagnosis not present

## 2018-02-27 ENCOUNTER — Ambulatory Visit
Admission: RE | Admit: 2018-02-27 | Discharge: 2018-02-27 | Disposition: A | Discharge status: Home / Self Care | Payer: Medicare HMO | Source: Ambulatory Visit | Attending: Radiation Oncology | Admitting: Radiation Oncology

## 2018-02-27 DIAGNOSIS — C180 Malignant neoplasm of cecum: Secondary | ICD-10-CM | POA: Diagnosis not present

## 2018-02-27 DIAGNOSIS — C20 Malignant neoplasm of rectum: Secondary | ICD-10-CM | POA: Diagnosis not present

## 2018-02-27 DIAGNOSIS — Z510 Encounter for antineoplastic radiation therapy: Secondary | ICD-10-CM | POA: Diagnosis not present

## 2018-02-28 ENCOUNTER — Ambulatory Visit
Admission: RE | Admit: 2018-02-28 | Discharge: 2018-02-28 | Disposition: A | Discharge status: Home / Self Care | Payer: Medicare HMO | Source: Ambulatory Visit | Attending: Radiation Oncology | Admitting: Radiation Oncology

## 2018-02-28 DIAGNOSIS — C20 Malignant neoplasm of rectum: Secondary | ICD-10-CM | POA: Diagnosis not present

## 2018-02-28 DIAGNOSIS — C180 Malignant neoplasm of cecum: Secondary | ICD-10-CM | POA: Diagnosis not present

## 2018-02-28 DIAGNOSIS — Z510 Encounter for antineoplastic radiation therapy: Secondary | ICD-10-CM | POA: Diagnosis not present

## 2018-03-01 ENCOUNTER — Ambulatory Visit
Admission: RE | Admit: 2018-03-01 | Discharge: 2018-03-01 | Disposition: A | Discharge status: Home / Self Care | Payer: Medicare HMO | Source: Ambulatory Visit | Attending: Radiation Oncology | Admitting: Radiation Oncology

## 2018-03-01 DIAGNOSIS — C20 Malignant neoplasm of rectum: Secondary | ICD-10-CM | POA: Diagnosis not present

## 2018-03-01 DIAGNOSIS — C180 Malignant neoplasm of cecum: Secondary | ICD-10-CM | POA: Diagnosis not present

## 2018-03-01 DIAGNOSIS — Z510 Encounter for antineoplastic radiation therapy: Secondary | ICD-10-CM | POA: Diagnosis not present

## 2018-03-02 ENCOUNTER — Ambulatory Visit
Admission: RE | Admit: 2018-03-02 | Discharge: 2018-03-02 | Disposition: A | Discharge status: Home / Self Care | Payer: Medicare HMO | Source: Ambulatory Visit | Attending: Radiation Oncology | Admitting: Radiation Oncology

## 2018-03-02 ENCOUNTER — Encounter: Payer: Self-pay | Admitting: Family

## 2018-03-02 DIAGNOSIS — C20 Malignant neoplasm of rectum: Secondary | ICD-10-CM | POA: Diagnosis not present

## 2018-03-02 DIAGNOSIS — C180 Malignant neoplasm of cecum: Secondary | ICD-10-CM | POA: Diagnosis not present

## 2018-03-02 DIAGNOSIS — Z510 Encounter for antineoplastic radiation therapy: Secondary | ICD-10-CM | POA: Diagnosis not present

## 2018-03-04 ENCOUNTER — Ambulatory Visit: Payer: Medicare HMO

## 2018-03-05 ENCOUNTER — Inpatient Hospital Stay: Payer: Medicare HMO

## 2018-03-05 ENCOUNTER — Ambulatory Visit
Admission: RE | Admit: 2018-03-05 | Discharge: 2018-03-05 | Disposition: A | Discharge status: Home / Self Care | Payer: Medicare HMO | Source: Ambulatory Visit | Attending: Radiation Oncology | Admitting: Radiation Oncology

## 2018-03-05 DIAGNOSIS — C180 Malignant neoplasm of cecum: Secondary | ICD-10-CM | POA: Diagnosis not present

## 2018-03-05 DIAGNOSIS — Z510 Encounter for antineoplastic radiation therapy: Secondary | ICD-10-CM | POA: Diagnosis not present

## 2018-03-05 DIAGNOSIS — C20 Malignant neoplasm of rectum: Secondary | ICD-10-CM | POA: Diagnosis not present

## 2018-03-06 ENCOUNTER — Ambulatory Visit
Admission: RE | Admit: 2018-03-06 | Discharge: 2018-03-06 | Disposition: A | Discharge status: Home / Self Care | Payer: Medicare HMO | Source: Ambulatory Visit | Attending: Radiation Oncology | Admitting: Radiation Oncology

## 2018-03-06 DIAGNOSIS — Z510 Encounter for antineoplastic radiation therapy: Secondary | ICD-10-CM | POA: Diagnosis not present

## 2018-03-06 DIAGNOSIS — C180 Malignant neoplasm of cecum: Secondary | ICD-10-CM | POA: Diagnosis not present

## 2018-03-06 DIAGNOSIS — C20 Malignant neoplasm of rectum: Secondary | ICD-10-CM | POA: Diagnosis not present

## 2018-03-07 ENCOUNTER — Ambulatory Visit
Admission: RE | Admit: 2018-03-07 | Discharge: 2018-03-07 | Disposition: A | Discharge status: Home / Self Care | Payer: Medicare HMO | Source: Ambulatory Visit | Attending: Radiation Oncology | Admitting: Radiation Oncology

## 2018-03-07 DIAGNOSIS — C20 Malignant neoplasm of rectum: Secondary | ICD-10-CM | POA: Diagnosis not present

## 2018-03-07 DIAGNOSIS — Z510 Encounter for antineoplastic radiation therapy: Secondary | ICD-10-CM | POA: Diagnosis not present

## 2018-03-07 DIAGNOSIS — C180 Malignant neoplasm of cecum: Secondary | ICD-10-CM | POA: Diagnosis not present

## 2018-03-08 ENCOUNTER — Ambulatory Visit
Admission: RE | Admit: 2018-03-08 | Discharge: 2018-03-08 | Disposition: A | Discharge status: Home / Self Care | Payer: Medicare HMO | Source: Ambulatory Visit | Attending: Radiation Oncology | Admitting: Radiation Oncology

## 2018-03-08 DIAGNOSIS — C180 Malignant neoplasm of cecum: Secondary | ICD-10-CM | POA: Diagnosis not present

## 2018-03-08 DIAGNOSIS — Z510 Encounter for antineoplastic radiation therapy: Secondary | ICD-10-CM | POA: Diagnosis not present

## 2018-03-08 DIAGNOSIS — C20 Malignant neoplasm of rectum: Secondary | ICD-10-CM | POA: Diagnosis not present

## 2018-03-09 ENCOUNTER — Ambulatory Visit
Admission: RE | Admit: 2018-03-09 | Discharge: 2018-03-09 | Disposition: A | Discharge status: Home / Self Care | Payer: Medicare HMO | Source: Ambulatory Visit | Attending: Radiation Oncology | Admitting: Radiation Oncology

## 2018-03-09 DIAGNOSIS — Z510 Encounter for antineoplastic radiation therapy: Secondary | ICD-10-CM | POA: Diagnosis not present

## 2018-03-09 DIAGNOSIS — C180 Malignant neoplasm of cecum: Secondary | ICD-10-CM | POA: Diagnosis not present

## 2018-03-09 DIAGNOSIS — C20 Malignant neoplasm of rectum: Secondary | ICD-10-CM | POA: Diagnosis not present

## 2018-03-12 ENCOUNTER — Ambulatory Visit: Payer: Medicare HMO

## 2018-03-12 ENCOUNTER — Ambulatory Visit
Admission: RE | Admit: 2018-03-12 | Discharge: 2018-03-12 | Disposition: A | Discharge status: Home / Self Care | Payer: Medicare HMO | Source: Ambulatory Visit | Attending: Radiation Oncology | Admitting: Radiation Oncology

## 2018-03-12 DIAGNOSIS — C20 Malignant neoplasm of rectum: Secondary | ICD-10-CM | POA: Diagnosis not present

## 2018-03-12 DIAGNOSIS — Z510 Encounter for antineoplastic radiation therapy: Secondary | ICD-10-CM | POA: Diagnosis not present

## 2018-03-12 DIAGNOSIS — C180 Malignant neoplasm of cecum: Secondary | ICD-10-CM | POA: Diagnosis not present

## 2018-03-13 ENCOUNTER — Ambulatory Visit
Admission: RE | Admit: 2018-03-13 | Discharge: 2018-03-13 | Disposition: A | Discharge status: Home / Self Care | Payer: Medicare HMO | Source: Ambulatory Visit | Attending: Radiation Oncology | Admitting: Radiation Oncology

## 2018-03-13 DIAGNOSIS — C20 Malignant neoplasm of rectum: Secondary | ICD-10-CM | POA: Diagnosis not present

## 2018-03-13 DIAGNOSIS — Z510 Encounter for antineoplastic radiation therapy: Secondary | ICD-10-CM | POA: Diagnosis not present

## 2018-03-13 DIAGNOSIS — C180 Malignant neoplasm of cecum: Secondary | ICD-10-CM | POA: Diagnosis not present

## 2018-03-19 ENCOUNTER — Other Ambulatory Visit: Payer: Self-pay | Admitting: Family

## 2018-03-19 DIAGNOSIS — I10 Essential (primary) hypertension: Secondary | ICD-10-CM

## 2018-03-20 NOTE — Telephone Encounter (Signed)
lov 11/25/17 MCR wellness [place] NOV sched

## 2018-03-23 NOTE — Telephone Encounter (Signed)
Call pt refiled  How is BP? Please advise her to call pharmacy and ensure has not been recalled based on manufacturer.

## 2018-03-26 ENCOUNTER — Inpatient Hospital Stay: Payer: Medicare HMO | Attending: Oncology

## 2018-03-26 ENCOUNTER — Inpatient Hospital Stay (HOSPITAL_BASED_OUTPATIENT_CLINIC_OR_DEPARTMENT_OTHER): Payer: Medicare HMO | Admitting: Oncology

## 2018-03-26 ENCOUNTER — Inpatient Hospital Stay: Payer: Medicare HMO

## 2018-03-26 VITALS — BP 145/76 | HR 69 | Temp 96.8°F | Wt 204.5 lb

## 2018-03-26 DIAGNOSIS — I10 Essential (primary) hypertension: Secondary | ICD-10-CM | POA: Diagnosis not present

## 2018-03-26 DIAGNOSIS — Z923 Personal history of irradiation: Secondary | ICD-10-CM

## 2018-03-26 DIAGNOSIS — Z9221 Personal history of antineoplastic chemotherapy: Secondary | ICD-10-CM | POA: Insufficient documentation

## 2018-03-26 DIAGNOSIS — Z79899 Other long term (current) drug therapy: Secondary | ICD-10-CM | POA: Diagnosis not present

## 2018-03-26 DIAGNOSIS — C186 Malignant neoplasm of descending colon: Secondary | ICD-10-CM | POA: Diagnosis not present

## 2018-03-26 DIAGNOSIS — K219 Gastro-esophageal reflux disease without esophagitis: Secondary | ICD-10-CM | POA: Diagnosis not present

## 2018-03-26 DIAGNOSIS — K581 Irritable bowel syndrome with constipation: Secondary | ICD-10-CM | POA: Diagnosis not present

## 2018-03-26 DIAGNOSIS — E559 Vitamin D deficiency, unspecified: Secondary | ICD-10-CM | POA: Diagnosis not present

## 2018-03-26 MED ORDER — SODIUM CHLORIDE 0.9% FLUSH
10.0000 mL | Freq: Once | INTRAVENOUS | Status: AC
  Administered 2018-03-26: 10 mL via INTRAVENOUS
  Filled 2018-03-26: qty 10

## 2018-03-26 MED ORDER — HEPARIN SOD (PORK) LOCK FLUSH 100 UNIT/ML IV SOLN
500.0000 [IU] | Freq: Once | INTRAVENOUS | Status: AC
  Administered 2018-03-26: 500 [IU] via INTRAVENOUS

## 2018-03-26 NOTE — Progress Notes (Signed)
Survivorship Care Plan visit completed.  Treatment summary reviewed and given to patient.  ASCO answers booklet reviewed and given to patient.  CARE program and Cancer Transitions discussed with patient along with other resources cancer center offers to patients and caregivers.  Patient verbalized understanding.    

## 2018-03-26 NOTE — Progress Notes (Signed)
[place] note [facility]  Telephone:(336) [phone] Fax:(336) [phone]  Patient Care Team: Burnard Hawthorne, FNP as PCP - General (Family Medicine) Vladimir Crofts, MD (Neurology) Noreene Filbert, MD as Referring Physician (Radiation Oncology) Leighton Ruff, MD as Consulting Physician (General Surgery) Earlie Server, MD as Consulting Physician (Oncology) Jules Husbands, MD as Consulting Physician (General Surgery)   Name of the patient: [name]  [id number]  23-Nov-1940   Date of visit: 03/26/18  CLINIC:  Survivorship  REASON FOR VISIT:  Survivorship Care Plan visit & follow-up post-treatment for a recent history of Colon Cancer- Adenocarcinoma s/p sigmoidectomy.  BRIEF ONCOLOGIC HISTORY:    Cancer of descending colon ([place])   06/07/2017 Initial Diagnosis    Colon cancer [facility])       INTERVAL HISTORY:  [name] presents to the [facility] today for our initial meeting to review the survivorship care plan detailing her treatment course for Colon Cancer, as well as monitoring long-term side effects of that treatment, education regarding health maintenance, screening, and overall wellness and health promotion.     Overall, [name] reports feeling quite well since completing her concurrent radiation/chemotherapy with 12 cycles of single agent 5-FU approximately 3 months ago.  Does admit to continued diarrhea that is improving. 3 episodes weekly.   ADDITIONAL REVIEW OF SYSTEMS:  Review of Systems  Constitutional: Negative.  Negative for chills, fever, malaise/fatigue and weight loss.  HENT: Negative for congestion and ear pain.   Eyes: Negative.  Negative for blurred vision and double vision.  Respiratory: Negative.  Negative for cough, sputum production and shortness of breath.   Cardiovascular: Negative.  Negative for chest pain, palpitations and leg swelling.  Gastrointestinal: Positive for diarrhea. Negative for abdominal  pain, constipation, nausea and vomiting.  Genitourinary: Negative for dysuria, frequency and urgency.  Musculoskeletal: Negative for back pain and falls.  Skin: Negative.  Negative for rash.  Neurological: Negative.  Negative for weakness and headaches.  Endo/Heme/Allergies: Negative.  Does not bruise/bleed easily.  Psychiatric/Behavioral: Negative.  Negative for depression. The patient is not nervous/anxious and does not have insomnia.      PAST MEDICAL/SURGICAL HISTORY:  Past Medical History:  Diagnosis Date  . Arthritis    bitateral knees and left foot,s/p cortisone injection in past  . Chronic constipation   . Colon cancer ([place]) 2018   pt is undergoing chemo 11-09-17  . Complication of anesthesia     slow to wake up   . Endometrial cancer ([place]) 2014   Total Hysterectomy and Rad tx's.   . Family history of cancer   . Genetic testing 09/26/2017    Multi-Cancer panel (83 genes) @ [facility] - No pathogenic mutations detected  . GERD (gastroesophageal reflux disease)   . Hypertension   . IBS (irritable bowel syndrome)   . Menopause    age 29  . PONV (postoperative nausea and vomiting)   . Vitamin D deficiency 06/07/10   Past Surgical History:  Procedure Laterality Date  . BUNIONECTOMY    . COLONOSCOPY WITH PROPOFOL N/A 04/04/2017   Procedure: COLONOSCOPY WITH PROPOFOL;  Surgeon: [name], MD;  Location: [facility] ENDOSCOPY;  Service: Endoscopy;  Laterality: N/A;  . DILATION AND CURETTAGE OF UTERUS  1971  . FOOT SURGERY    . NASAL SINUS SURGERY    . PORTACATH PLACEMENT Right 07/12/2017   Procedure: INSERTION PORT-A-CATH;  Surgeon: Jules Husbands, MD;  Location: [facility] ORS;  Service: General;  Laterality: Right;  . ROBOTIC  ASSISTED LAPAROSCOPIC VENTRAL/INCISIONAL HERNIA REPAIR N/A 06/07/2017   Procedure: ROBOTIC [place];  Surgeon: Leighton Ruff, MD;  Location: [facility] ORS;  Service: General;  Laterality: N/A;  . VAGINAL DELIVERY    . VAGINAL HYSTERECTOMY        ALLERGIES:  Allergies  Allergen Reactions  . Codeine Nausea And Vomiting      CURRENT MEDICATIONS:  Current Outpatient Medications on File Prior to Visit  Medication Sig Dispense Refill  . losartan (COZAAR) 50 MG tablet TAKE 1 TABLET EVERY DAY 90 tablet 3  . Magnesium 400 MG TABS Take 400 mg by mouth daily as needed (irritable bowel syndrome).    . metoprolol succinate (TOPROL-XL) 100 MG 24 hr tablet TAKE 1 TABLET DAILY 90 tablet 3  . Multiple Vitamin (MULTIVITAMIN WITH MINERALS) TABS tablet Take 1 tablet by mouth daily.    . Multiple Vitamins-Minerals (ICAPS AREDS 2 PO) Take 2 capsules by mouth daily.    . simvastatin (ZOCOR) 20 MG tablet TAKE 1 TABLET EVERY EVENING 90 tablet 4  . topiramate (TOPAMAX) 50 MG tablet Take 1 tablet (50 mg total) by mouth 2 (two) times daily. 180 tablet 0  . chlorhexidine (PERIDEX) 0.12 % solution Use as directed 15 mLs in the mouth or throat 2 (two) times daily. (Patient not taking: Reported on 03/26/2018) 473 mL 0  . fluticasone (FLONASE) 50 MCG/ACT nasal spray Place 2 sprays into both nostrils daily. 16 g 2  . lidocaine-prilocaine (EMLA) cream Apply 1 application topically as needed. (Patient not taking: Reported on 03/26/2018) 30 g 0  . ondansetron (ZOFRAN) 8 MG tablet Take 1 tablet (8 mg total) by mouth 2 (two) times daily as needed for refractory nausea / vomiting. Start on day 3 after chemotherapy. (Patient not taking: Reported on 03/26/2018) 30 tablet 1  . Polyethylene Glycol 3350 (MIRALAX PO) Take 17 g by mouth daily as needed (constipation).     . prochlorperazine (COMPAZINE) 10 MG tablet Take 1 tablet (10 mg total) by mouth every 6 (six) hours as needed (Nausea or vomiting). (Patient not taking: Reported on 03/26/2018) 30 tablet 1   Current Facility-Administered Medications on File Prior to Visit  Medication Dose Route Frequency Provider Last Rate Last Dose  . sodium chloride flush (NS) 0.9 % injection 10 mL  10 mL Intracatheter PRN Earlie Server, MD    10 mL at 11/08/17 1400       ONCOLOGIC FAMILY HISTORY:  Family History  Problem Relation Age of Onset  . Heart disease Father 64       massive MI  . Diabetes Father   . Bipolar disorder Sister   . Osteoporosis Sister   . Breast cancer Sister 37       currently 78  . Cancer Sister        breast  . Bipolar disorder Maternal Aunt   . Breast cancer Maternal Aunt        age at dx unknown; deceased 37s  . Colon cancer Maternal Grandfather 22       deceased 43  . Cancer Maternal Grandfather        breast  . Osteoporosis Mother   . Colon cancer Mother        age at dx unknown; deceased 73  . Cancer Maternal Grandmother        unk. type; deceased 107  . Breast cancer Paternal Aunt        deceased 34    PHYSICAL EXAMINATION:  Vital Signs: Vitals:  03/26/18 1518  BP: (!) 145/76  Pulse: 69  Temp: (!) 96.8 F (36 C)     Physical Exam  Constitutional: She is oriented to person, place, and time and well-developed, well-nourished, and in no distress. Vital signs are normal.  HENT:  Head: Normocephalic and atraumatic.  Eyes: Pupils are equal, round, and reactive to light.  Neck: Normal range of motion.  Cardiovascular: Normal rate, regular rhythm and normal heart sounds.  No murmur heard. Pulmonary/Chest: Effort normal and breath sounds normal. She has no wheezes.  Abdominal: Soft. Normal appearance and bowel sounds are normal. She exhibits no distension. There is no tenderness.  Musculoskeletal: Normal range of motion. She exhibits no edema.  Neurological: She is alert and oriented to person, place, and time. Gait normal.  Skin: Skin is warm and dry. No rash noted.  Psychiatric: Mood, memory, affect and judgment normal.    LABORATORY DATA:   Most recent labs:Results for [place], Terresa N "Anaria Moffatt" (MRN [id number]) as of 03/26/2018 15:16  Ref. Range 01/29/2018 14:37 02/12/2018 15:16  COMPREHENSIVE METABOLIC PANEL Unknown Rpt (A)   Sodium Latest Ref Range: 135 - 145 mmol/L  139   Potassium Latest Ref Range: 3.5 - 5.1 mmol/L 3.6   Chloride Latest Ref Range: 101 - 111 mmol/L 109   CO2 Latest Ref Range: 22 - 32 mmol/L 22   Glucose Latest Ref Range: 65 - 99 mg/dL 162 (H)   BUN Latest Ref Range: 6 - 20 mg/dL 19   Creatinine Latest Ref Range: 0.44 - 1.00 mg/dL 0.82   Calcium Latest Ref Range: 8.9 - 10.3 mg/dL 9.0   Anion gap Latest Ref Range: 5 - 15  8   Alkaline Phosphatase Latest Ref Range: 38 - 126 U/L 123   Albumin Latest Ref Range: 3.5 - 5.0 g/dL 3.5   AST Latest Ref Range: 15 - 41 U/L 27   ALT Latest Ref Range: 14 - 54 U/L 21   Total Protein Latest Ref Range: 6.5 - 8.1 g/dL 6.8   Total Bilirubin Latest Ref Range: 0.3 - 1.2 mg/dL 0.5   GFR, Est Non African American Latest Ref Range: >60 mL/min >60   GFR, Est African American Latest Ref Range: >60 mL/min >60   WBC Latest Ref Range: 3.6 - 11.0 K/uL 6.1 4.2  RBC Latest Ref Range: 3.80 - 5.20 MIL/uL 3.71 (L) 3.62 (L)  Hemoglobin Latest Ref Range: 12.0 - 16.0 g/dL 11.9 (L) 11.5 (L)  HCT Latest Ref Range: 35.0 - 47.0 % 35.5 34.5 (L)  MCV Latest Ref Range: 80.0 - 100.0 fL 95.6 95.2  MCH Latest Ref Range: 26.0 - 34.0 pg 32.0 31.8  MCHC Latest Ref Range: 32.0 - 36.0 g/dL 33.5 33.4  RDW Latest Ref Range: 11.5 - 14.5 % 17.2 (H) 15.9 (H)  Platelets Latest Ref Range: 150 - 440 K/uL 207 183  Neutrophils Latest Units: % 62   Lymphocytes Latest Units: % 29   Monocytes Relative Latest Units: % 5   Eosinophil Latest Units: % 3   Basophil Latest Units: % 1   NEUT# Latest Ref Range: 1.4 - 6.5 K/uL 3.8   Lymphocyte # Latest Ref Range: 1.0 - 3.6 K/uL 1.7   Monocyte # Latest Ref Range: 0.2 - 0.9 K/uL 0.3   Eosinophils Absolute Latest Ref Range: 0 - 0.7 K/uL 0.2   Basophils Absolute Latest Ref Range: 0 - 0.1 K/uL 0.0      DIAGNOSTIC IMAGING:   CT abdomen/pelvis from 01/23/18 IMPRESSION: 1. Status  post partial colectomy for rectal mass. No specific findings identified to suggest recurrent tumor or  metastatic disease. 2. The inferior mesenteric artery appears thrombosed which may reflect postoperative changes. The superior mesenteric artery remains widely patent and there are no secondary signs to suggest bowel ischemia. 3.  Aortic Atherosclerosis (ICD10-I70.0).   Electronically Signed   By: Kerby Moors M.D.   On: 01/23/2018 13:57  ASSESSMENT AND PLAN:  [name] is a pleasant 77 y.o. female with history of colon cancer treated with single agent 5-FU x12 cycles completed 01/01/18; and radiation therapy completed treatment on 03/13/18.  Patient presents to survivorship clinic today for routine follow-up after finishing treatment.  She is to return to clinic every 6 months with CT abdomen/pelvis for first 2 years and annually for first 5 years.  She will return to clinic in  3 months with lab work including CEA and MD assessment.  1. Cancer Colon:  [name] is continuing to recover from the effects of cancer treatment.  Patient was last seen by primary medical oncologist Dr. Tasia Catchings on 01/29/2018 where she was doing well and offered no new complaints.  She recently had recovered from hand-foot syndrome which resolved with a dose reduction of 5-FU with complete resolution.  Does admit to continued diarrhea that has improved since ending radiation therapy.  Continues to take Imodium PRN.  Currently experiencing approximately 3 episodes weekly.  Otherwise offers no complaints.  NCCN guidelines for surveillance are as follows:      Dispo:  -RTC Dr. Tasia Catchings for labs and assessment on April 30, 2018. -RTC to see Dr Donella Stade for follow-up on 04/19/18.  -Patient knows she can contact survivorship clinic with any questions or concerns.    A total of (25) minutes of face-to-face time was spent with this patient with greater than 50% of that time in counseling and care-coordination.  Faythe Casa, NP 03/26/2018 3:32 PM    Note: PRIMARY CARE PROVIDER Burnard Hawthorne,  [place] Fax: [phone]

## 2018-03-28 MED ORDER — TOPIRAMATE 50 MG PO TABS: 50.0000 mg | ORAL_TABLET | Freq: Two times a day (BID) | ORAL | 0 refills | Status: DC

## 2018-04-19 ENCOUNTER — Other Ambulatory Visit: Payer: Self-pay

## 2018-04-19 ENCOUNTER — Encounter: Payer: Self-pay | Admitting: Radiation Oncology

## 2018-04-19 ENCOUNTER — Ambulatory Visit
Admission: RE | Admit: 2018-04-19 | Discharge: 2018-04-19 | Disposition: A | Discharge status: Home / Self Care | Payer: Medicare HMO | Source: Ambulatory Visit | Attending: Radiation Oncology | Admitting: Radiation Oncology

## 2018-04-19 VITALS — BP 116/64 | HR 51 | Temp 96.9°F | Resp 20 | Wt 201.7 lb

## 2018-04-19 DIAGNOSIS — Z923 Personal history of irradiation: Secondary | ICD-10-CM | POA: Diagnosis not present

## 2018-04-19 DIAGNOSIS — C20 Malignant neoplasm of rectum: Secondary | ICD-10-CM | POA: Insufficient documentation

## 2018-04-19 DIAGNOSIS — Z8542 Personal history of malignant neoplasm of other parts of uterus: Secondary | ICD-10-CM | POA: Diagnosis not present

## 2018-04-19 DIAGNOSIS — Z9049 Acquired absence of other specified parts of digestive tract: Secondary | ICD-10-CM | POA: Diagnosis not present

## 2018-04-19 DIAGNOSIS — C186 Malignant neoplasm of descending colon: Secondary | ICD-10-CM

## 2018-04-19 NOTE — Progress Notes (Signed)
Radiation Oncology Follow up Note  Name: [name]   Date:   04/19/2018 MRN:  [id number] DOB: 06-Jan-1941    This 77 y.o. female presents to the clinic today for one-month follow-up status postadjuvant radiation therapy for stage IIIB locally advanced rectal cancer status post robotic colectomy in patient with history of endometrial carcinoma receiving vaginal brachytherapy.  REFERRING PROVIDER: Burnard Hawthorne, FNP  HPI: patient is a 77 year old female now seen out 1 month having completed external beam radiation therapy for locally advanced stage IIIB rectal cancer status post robotic colectomy patient with a prior history of endometrial carcinoma receiving brachy therapy. Seen today in routine follow-up she is doing well she states she still has some slight intermittent diarrhea doesn't occasional take Imodium does not follow any low residue diet. She's having no bowel pain. No increased lower urinary tract symptoms..she is off all chemotherapy at this time.  COMPLICATIONS OF TREATMENT: none  FOLLOW UP COMPLIANCE: keeps appointments   PHYSICAL EXAM:  BP 116/64   Pulse (!) 51   Temp (!) 96.9 F (36.1 C)   Resp 20   Wt 201 lb 11.5 oz (91.5 kg)   BMI 33.57 kg/m  Well-developed well-nourished patient in NAD. HEENT reveals PERLA, EOMI, discs not visualized.  Oral cavity is clear. No oral mucosal lesions are identified. Neck is clear without evidence of cervical or supraclavicular adenopathy. Lungs are clear to A&P. Cardiac examination is essentially unremarkable with regular rate and rhythm without murmur rub or thrill. Abdomen is benign with no organomegaly or masses noted. Motor sensory and DTR levels are equal and symmetric in the upper and lower extremities. Cranial nerves II through XII are grossly intact. Proprioception is intact. No peripheral adenopathy or edema is identified. No motor or sensory levels are noted. Crude visual fields are within normal range.  RADIOLOGY RESULTS:  no current films for review  PLAN: resent time patient is doing well recovering nicely from her external beam radiation therapy. I'm please were overall progress. I've asked to see her back in 4-5 months for follow-up. She continues close follow-up care with medical oncology. Patient knows to call with any concerns at any time.  I would like to take this opportunity to thank you for allowing me to participate in the care of your patient.Noreene Filbert, MD

## 2018-04-30 ENCOUNTER — Inpatient Hospital Stay: Payer: Medicare HMO | Attending: Oncology

## 2018-04-30 ENCOUNTER — Other Ambulatory Visit: Payer: Self-pay

## 2018-04-30 ENCOUNTER — Inpatient Hospital Stay (HOSPITAL_BASED_OUTPATIENT_CLINIC_OR_DEPARTMENT_OTHER): Payer: Medicare HMO | Admitting: Oncology

## 2018-04-30 ENCOUNTER — Encounter: Payer: Self-pay | Admitting: Oncology

## 2018-04-30 VITALS — BP 135/65 | HR 57 | Temp 97.3°F | Resp 18 | Wt 203.4 lb

## 2018-04-30 DIAGNOSIS — Z452 Encounter for adjustment and management of vascular access device: Secondary | ICD-10-CM | POA: Insufficient documentation

## 2018-04-30 DIAGNOSIS — D649 Anemia, unspecified: Secondary | ICD-10-CM | POA: Diagnosis not present

## 2018-04-30 DIAGNOSIS — Z9221 Personal history of antineoplastic chemotherapy: Secondary | ICD-10-CM | POA: Diagnosis not present

## 2018-04-30 DIAGNOSIS — K581 Irritable bowel syndrome with constipation: Secondary | ICD-10-CM | POA: Diagnosis not present

## 2018-04-30 DIAGNOSIS — C187 Malignant neoplasm of sigmoid colon: Secondary | ICD-10-CM

## 2018-04-30 DIAGNOSIS — C186 Malignant neoplasm of descending colon: Secondary | ICD-10-CM | POA: Insufficient documentation

## 2018-04-30 DIAGNOSIS — I10 Essential (primary) hypertension: Secondary | ICD-10-CM | POA: Insufficient documentation

## 2018-04-30 DIAGNOSIS — E559 Vitamin D deficiency, unspecified: Secondary | ICD-10-CM | POA: Insufficient documentation

## 2018-04-30 DIAGNOSIS — K219 Gastro-esophageal reflux disease without esophagitis: Secondary | ICD-10-CM | POA: Diagnosis not present

## 2018-04-30 DIAGNOSIS — Z923 Personal history of irradiation: Secondary | ICD-10-CM | POA: Diagnosis not present

## 2018-04-30 LAB — CBC WITH DIFFERENTIAL/PLATELET
Basophils Absolute: 0 10*3/uL (ref 0–0.1)
Basophils Relative: 1 %
EOS PCT: 6 %
Eosinophils Absolute: 0.2 10*3/uL (ref 0–0.7)
HCT: 35.2 % (ref 35.0–47.0)
HEMOGLOBIN: 11.9 g/dL — AB (ref 12.0–16.0)
Lymphocytes Relative: 28 %
Lymphs Abs: 0.9 10*3/uL — ABNORMAL LOW (ref 1.0–3.6)
MCH: 30.1 pg (ref 26.0–34.0)
MCHC: 33.8 g/dL (ref 32.0–36.0)
MCV: 89 fL (ref 80.0–100.0)
MONO ABS: 0.3 10*3/uL (ref 0.2–0.9)
MONOS PCT: 10 %
NEUTROS PCT: 55 %
Neutro Abs: 1.8 10*3/uL (ref 1.4–6.5)
Platelets: 181 10*3/uL (ref 150–440)
RBC: 3.96 MIL/uL (ref 3.80–5.20)
RDW: 14.5 % (ref 11.5–14.5)
WBC: 3.2 10*3/uL — ABNORMAL LOW (ref 3.6–11.0)

## 2018-04-30 LAB — COMPREHENSIVE METABOLIC PANEL
ALT: 22 U/L (ref 14–54)
ANION GAP: 4 — AB (ref 5–15)
AST: 25 U/L (ref 15–41)
Albumin: 3.7 g/dL (ref 3.5–5.0)
Alkaline Phosphatase: 97 U/L (ref 38–126)
BILIRUBIN TOTAL: 0.7 mg/dL (ref 0.3–1.2)
BUN: 18 mg/dL (ref 6–20)
CHLORIDE: 113 mmol/L — AB (ref 101–111)
CO2: 21 mmol/L — ABNORMAL LOW (ref 22–32)
Calcium: 9.2 mg/dL (ref 8.9–10.3)
Creatinine, Ser: 0.86 mg/dL (ref 0.44–1.00)
GFR calc Af Amer: >60 mL/min (ref >60)
Glucose, Bld: 93 mg/dL (ref 65–99)
POTASSIUM: 4.1 mmol/L (ref 3.5–5.1)
Sodium: 138 mmol/L (ref 135–145)
TOTAL PROTEIN: 6.6 g/dL (ref 6.5–8.1)

## 2018-04-30 NOTE — Progress Notes (Signed)
Patient here today for follow up.   

## 2018-04-30 NOTE — Progress Notes (Signed)
Hematology/Oncology Follow Up visit. [place] [phone] [phone]  CONSULT NOTE Patient Care Team: Burnard Hawthorne, FNP as PCP - General (Family Medicine) Vladimir Crofts, MD (Neurology) Noreene Filbert, MD as Referring Physician (Radiation Oncology) Leighton Ruff, MD as Consulting Physician (General Surgery) Earlie Server, MD as Consulting Physician (Oncology) Jules Husbands, MD as Consulting Physician (General Surgery)  PURPOSE OF CONSULTATION:  Follow up for treatment of Colon cancer  HISTORY OF PRESENTING ILLNESS:  [name] 77 y.o.  female with PMH listed as below was referred to me for evaluation and management of colon cancer.  She had colonoscopy evaluation due to positive cologuard.  She was found to have a nonobstructing mass approximately 20 cm from the anal verge which biopsy proved to be adenocarcinoma. CT scan showed a large mass from the sigmoid and extends beyond the wall of the colon toward the left into the surrounding fat. Preoperational CEA was normal. Patient underwent robotic sigmoidectomy on 06/07/2017.   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 MLH1  loss of function, PMS2 loss of function. Declined option of clinical trial ATOMIC She has a history of endometrial cancer s/p hysterotomy and radiation. She has a family history of colon cancer and breast cancer.  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 Results have been explained to patient by genetic counselor.   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   INTERVAL HISTORY 77 yo female with above history presents for follow up for stage IIIb: Cancer. Status post adjuvant chemotherapy and radiation.  Radiation finished April 2019. Fatigue: Better. Hand-foot syndrome; resolved. Today  reports doing well with no complaints.    Review of Systems  Constitutional: Negative for appetite change, chills, fatigue and fever.  HENT:   Negative for hearing loss, lump/mass, mouth sores, nosebleeds and sore throat.   Eyes:       Dry eyes and runny eyes.  Respiratory: Negative for chest tightness, cough and shortness of breath.   Cardiovascular: Negative for chest pain and leg swelling.  Gastrointestinal: Negative for abdominal pain, blood in stool, constipation, diarrhea and nausea.  Endocrine: Negative for hot flashes.  Genitourinary: Negative for dysuria, hematuria, nocturia and pelvic pain.   Musculoskeletal: Negative for arthralgias.  Skin: Negative for itching, rash and wound.  Neurological: Negative for headaches and numbness.  Hematological: Negative for adenopathy.  Psychiatric/Behavioral: Negative for confusion. The patient is not nervous/anxious.    MEDICAL HISTORY:  Past Medical History:  Diagnosis Date  . Arthritis    bitateral knees and left foot,s/p cortisone injection in past  . Chronic constipation   . Colon cancer ([place]) 2018   pt is undergoing chemo 11-09-17  . Complication of anesthesia     slow to wake up   . Endometrial cancer ([place]) 2014   Total Hysterectomy and Rad tx's.   . Family history of cancer   . Genetic testing 09/26/2017    Multi-Cancer panel (83 genes) @ [facility] - No pathogenic mutations detected  . GERD (gastroesophageal reflux disease)   . Hypertension   . IBS (irritable bowel syndrome)   . Menopause    age 4  . PONV (postoperative nausea and vomiting)   . Vitamin D deficiency 06/07/10    SURGICAL HISTORY: Past Surgical History:  Procedure Laterality Date  . BUNIONECTOMY    .  COLONOSCOPY WITH PROPOFOL N/A 04/04/2017   Procedure: COLONOSCOPY WITH PROPOFOL;  Surgeon: [name], MD;  [facility] ENDOSCOPY;  Service: Endoscopy;  Laterality: N/A;  . DILATION AND CURETTAGE OF UTERUS  1971  . FOOT SURGERY    . NASAL SINUS  SURGERY    . PORTACATH PLACEMENT Right 07/12/2017   Procedure: INSERTION PORT-A-CATH;  Surgeon: Jules Husbands, MD;  Location: [facility] ORS;  Service: General;  Laterality: Right;  . ROBOTIC ASSISTED LAPAROSCOPIC VENTRAL/INCISIONAL HERNIA REPAIR N/A 06/07/2017   Procedure: ROBOTIC INCISIONAL HERNIA REPAIR;  Surgeon: Leighton Ruff, MD;  Location: [facility] ORS;  Service: General;  Laterality: N/A;  . VAGINAL DELIVERY    . VAGINAL HYSTERECTOMY      SOCIAL HISTORY: Social History   Socioeconomic History  . Marital status: Divorced    Spouse name: Not on file  . Number of children: Not on file  . Years of education: Not on file  . Highest education level: Not on file  Occupational History  . Not on file  Social Needs  . Financial resource strain: Not on file  . Food insecurity:    Worry: Not on file    Inability: Not on file  . Transportation needs:    Medical: Not on file    Non-medical: Not on file  Tobacco Use  . Smoking status: Never Smoker  . Smokeless tobacco: Never Used  Substance and Sexual Activity  . Alcohol use: Yes    Comment: socially/rarely  . Drug use: No  . Sexual activity: Never  Lifestyle  . Physical activity:    Days per week: Not on file    Minutes per session: Not on file  . Stress: Not on file  Relationships  . Social connections:    Talks on phone: Not on file    Gets together: Not on file    Attends religious service: Not on file    Active member of club or organization: Not on file    Attends meetings of clubs or organizations: Not on file    Relationship status: Not on file  . Intimate partner violence:    Fear of current or ex partner: Not on file    Emotionally abused: Not on file    Physically abused: Not on file    Forced sexual activity: Not on file  Other Topics Concern  . Not on file  Social History Narrative   Exercise- aerobic exercise at home 3-4x week.       Lives in [place]. No pets.    FAMILY HISTORY: Family History  Problem  Relation Age of Onset  . Heart disease Father 75       massive MI  . Diabetes Father   . Bipolar disorder Sister   . Osteoporosis Sister   . Breast cancer Sister 8       currently 93  . Cancer Sister        breast  . Bipolar disorder Maternal Aunt   . Breast cancer Maternal Aunt        age at dx unknown; deceased 62s  . Colon cancer Maternal Grandfather 42       deceased 83  . Cancer Maternal Grandfather        breast  . Osteoporosis Mother   . Colon cancer Mother        age at dx unknown; deceased 70  . Cancer Maternal Grandmother        unk. type; deceased 14  . Breast cancer Paternal  Aunt        deceased 102    ALLERGIES:  is allergic to codeine.  MEDICATIONS:  Current Outpatient Medications  Medication Sig Dispense Refill  . fluticasone (FLONASE) 50 MCG/ACT nasal spray Place 2 sprays into both nostrils daily. 16 g 2  . lidocaine-prilocaine (EMLA) cream Apply 1 application topically as needed. 30 g 0  . losartan (COZAAR) 50 MG tablet TAKE 1 TABLET EVERY DAY 90 tablet 3  . Magnesium 400 MG TABS Take 400 mg by mouth daily as needed (irritable bowel syndrome).    . metoprolol succinate (TOPROL-XL) 100 MG 24 hr tablet TAKE 1 TABLET DAILY 90 tablet 3  . Multiple Vitamin (MULTIVITAMIN WITH MINERALS) TABS tablet Take 1 tablet by mouth daily.    . Multiple Vitamins-Minerals (ICAPS AREDS 2 PO) Take 2 capsules by mouth daily.    . Polyethylene Glycol 3350 (MIRALAX PO) Take 17 g by mouth daily as needed (constipation).     . simvastatin (ZOCOR) 20 MG tablet TAKE 1 TABLET EVERY EVENING 90 tablet 4  . topiramate (TOPAMAX) 50 MG tablet Take 1 tablet (50 mg total) by mouth 2 (two) times daily. 180 tablet 0  . chlorhexidine (PERIDEX) 0.12 % solution Use as directed 15 mLs in the mouth or throat 2 (two) times daily. (Patient not taking: Reported on 03/26/2018) 473 mL 0  . ondansetron (ZOFRAN) 8 MG tablet Take 1 tablet (8 mg total) by mouth 2 (two) times daily as needed for refractory  nausea / vomiting. Start on day 3 after chemotherapy. (Patient not taking: Reported on 03/26/2018) 30 tablet 1  . prochlorperazine (COMPAZINE) 10 MG tablet Take 1 tablet (10 mg total) by mouth every 6 (six) hours as needed (Nausea or vomiting). (Patient not taking: Reported on 03/26/2018) 30 tablet 1   No current facility-administered medications for this visit.    Facility-Administered Medications Ordered in Other Visits  Medication Dose Route Frequency Provider Last Rate Last Dose  . sodium chloride flush (NS) 0.9 % injection 10 mL  10 mL Intracatheter PRN Earlie Server, MD   10 mL at 11/08/17 1400      .  PHYSICAL EXAMINATION: ECOG PERFORMANCE STATUS: 1 - Symptomatic but completely ambulatory Vitals:   04/30/18 1448  BP: 135/65  Pulse: (!) 57  Resp: 18  Temp: (!) 97.3 F (36.3 C)   Filed Weights   04/30/18 1448  Weight: 203 lb 6.4 oz (92.3 kg)   Physical Exam  Constitutional: She is oriented to person, place, and time. She appears well-developed and well-nourished. No distress.  HENT:  Head: Normocephalic and atraumatic.  Right Ear: External ear normal.  Left Ear: External ear normal.  Mouth/Throat: Oropharynx is clear and moist. No oropharyngeal exudate.  Eyes: Pupils are equal, round, and reactive to light. Conjunctivae and EOM are normal. No scleral icterus.  Neck: Normal range of motion. Neck supple.  Cardiovascular: Normal rate, regular rhythm and normal heart sounds.  No murmur heard. Pulmonary/Chest: Effort normal and breath sounds normal. No stridor. No respiratory distress. She has no wheezes. She has no rales. She exhibits no tenderness.  Abdominal: Soft. Bowel sounds are normal. She exhibits no distension and no mass. There is no tenderness. There is no guarding.  Musculoskeletal: Normal range of motion. She exhibits no edema or deformity.  Lymphadenopathy:    She has no cervical adenopathy.  Neurological: She is alert and oriented to person, place, and time. No  cranial nerve deficit. Coordination normal.  Skin: Skin is warm and  dry. No rash noted.  Psychiatric: She has a normal mood and affect. Her behavior is normal. Thought content normal.    LABORATORY DATA:  I have reviewed the data as listed Lab Results  Component Value Date   WBC 3.2 (L) 04/30/2018   HGB 11.9 (L) 04/30/2018   HCT 35.2 04/30/2018   MCV 89.0 04/30/2018   PLT 181 04/30/2018   Recent Labs    01/01/18 0919 01/29/18 1437 04/30/18 1436  NA 138 139 138  K 3.7 3.6 4.1  CL 109 109 113*  CO2 21* 22 21*  GLUCOSE 137* 162* 93  BUN '17 19 18  ' CREATININE 0.90 0.82 0.86  CALCIUM 9.4 9.0 9.2  GFRNONAA >60 >60 >60  GFRAA >60 >60 >60  PROT 6.7 6.8 6.6  ALBUMIN 3.6 3.5 3.7  AST '31 27 25  ' ALT '18 21 22  ' ALKPHOS 126 123 97  BILITOT 0.8 0.5 0.7    RADIOGRAPHIC STUDIES: I have personally reviewed the radiological images as listed and agreed with the findings in the report. 04/12/2017 CT abdomen pelvis w contrast Large mass arising from the sigmoid colon. This mass extends beyond the wall of the colon toward the left into the surrounding fat. This lesion does not reach the left pelvic sidewall.No adenopathy or liver lesions evident. No omental lesions appreciable. Sizable midline ventral hernia slightly superior to theumbilicus containing fat and a loop of colon without bowel compromise. Much smaller midline umbilical ventral hernia containingonly fat. There is a small hiatal hernia. No bowel obstruction.  No abscess.  Appendix region appears normal. No renal or ureteral calculus.  No hydronephrosis.There is aortoiliac atherosclerosis. There are foci of coronary artery calcification.    ASSESSMENT & PLAN:  77 y.o.female who has Stage IIIB colon cancer.  Cancer Staging Cancer of descending colon [facility]) Staging form: Colon and Rectum, AJCC 8th Edition - Pathologic: Stage IIIB (pT4a, pN1, cM0) - Signed by Earlie Server, MD on 04/30/2018  1. Malignant neoplasm of sigmoid colon ([place])    2. Anemia, unspecified type    #Labs reviewed, stable.  Stable.  CEA pending. Repeat CT in 3 months. Patient is due for repeating colonoscopy.  Discussed with patient and encourage patient to follow-up with Dr. Allen Norris to repeat colonoscopy. Anemia all questions were answered. The patient knows to call the clinic with any problems questions or concerns.  Return of visit: 3 months.  After CT scan.  Earlie Server, MD, PhD Hematology Oncology [facility] at [facility] Pager- [phone] 04/30/2018

## 2018-05-01 LAB — CEA: CEA1: 1.2 ng/mL (ref 0.0–4.7)

## 2018-05-04 ENCOUNTER — Other Ambulatory Visit: Payer: Self-pay | Admitting: *Deleted

## 2018-05-04 DIAGNOSIS — C187 Malignant neoplasm of sigmoid colon: Secondary | ICD-10-CM

## 2018-05-07 ENCOUNTER — Inpatient Hospital Stay: Payer: Medicare HMO

## 2018-05-07 ENCOUNTER — Other Ambulatory Visit: Payer: Self-pay

## 2018-05-07 DIAGNOSIS — Z923 Personal history of irradiation: Secondary | ICD-10-CM | POA: Diagnosis not present

## 2018-05-07 DIAGNOSIS — I10 Essential (primary) hypertension: Secondary | ICD-10-CM | POA: Diagnosis not present

## 2018-05-07 DIAGNOSIS — C187 Malignant neoplasm of sigmoid colon: Secondary | ICD-10-CM

## 2018-05-07 DIAGNOSIS — Z452 Encounter for adjustment and management of vascular access device: Secondary | ICD-10-CM | POA: Diagnosis not present

## 2018-05-07 DIAGNOSIS — Z9221 Personal history of antineoplastic chemotherapy: Secondary | ICD-10-CM | POA: Diagnosis not present

## 2018-05-07 DIAGNOSIS — K581 Irritable bowel syndrome with constipation: Secondary | ICD-10-CM | POA: Diagnosis not present

## 2018-05-07 DIAGNOSIS — C186 Malignant neoplasm of descending colon: Secondary | ICD-10-CM | POA: Diagnosis not present

## 2018-05-07 DIAGNOSIS — D649 Anemia, unspecified: Secondary | ICD-10-CM | POA: Diagnosis not present

## 2018-05-07 DIAGNOSIS — K219 Gastro-esophageal reflux disease without esophagitis: Secondary | ICD-10-CM | POA: Diagnosis not present

## 2018-05-07 DIAGNOSIS — E559 Vitamin D deficiency, unspecified: Secondary | ICD-10-CM | POA: Diagnosis not present

## 2018-05-07 MED ORDER — HEPARIN SOD (PORK) LOCK FLUSH 100 UNIT/ML IV SOLN
500.0000 [IU] | Freq: Once | INTRAVENOUS | Status: AC
  Administered 2018-05-07: 500 [IU] via INTRAVENOUS

## 2018-05-07 MED ORDER — SODIUM CHLORIDE 0.9% FLUSH
10.0000 mL | Freq: Once | INTRAVENOUS | Status: AC
  Administered 2018-05-07: 10 mL via INTRAVENOUS
  Filled 2018-05-07: qty 10

## 2018-05-14 ENCOUNTER — Other Ambulatory Visit: Payer: Self-pay

## 2018-05-15 ENCOUNTER — Other Ambulatory Visit: Payer: Self-pay

## 2018-05-15 MED ORDER — PEG 3350-KCL-NABCB-NACL-NASULF 236 G PO SOLR: 4000.0000 mL | Freq: Once | ORAL | 0 refills | Status: AC

## 2018-05-21 ENCOUNTER — Encounter: Payer: Self-pay | Admitting: *Deleted

## 2018-05-22 ENCOUNTER — Ambulatory Visit
Admission: RE | Admit: 2018-05-22 | Discharge: 2018-05-22 | Disposition: A | Discharge status: Home / Self Care | Payer: Medicare HMO | Source: Ambulatory Visit | Attending: Gastroenterology | Admitting: Gastroenterology

## 2018-05-22 ENCOUNTER — Encounter
Admission: RE | Disposition: A | Discharge status: Home / Self Care | Payer: Self-pay | Source: Ambulatory Visit | Attending: Gastroenterology

## 2018-05-22 ENCOUNTER — Other Ambulatory Visit: Payer: Self-pay

## 2018-05-22 ENCOUNTER — Ambulatory Visit: Payer: Medicare HMO | Admitting: Anesthesiology

## 2018-05-22 ENCOUNTER — Encounter: Payer: Self-pay | Admitting: *Deleted

## 2018-05-22 DIAGNOSIS — I10 Essential (primary) hypertension: Secondary | ICD-10-CM | POA: Diagnosis not present

## 2018-05-22 DIAGNOSIS — E785 Hyperlipidemia, unspecified: Secondary | ICD-10-CM | POA: Diagnosis not present

## 2018-05-22 DIAGNOSIS — K219 Gastro-esophageal reflux disease without esophagitis: Secondary | ICD-10-CM | POA: Diagnosis not present

## 2018-05-22 DIAGNOSIS — Z8542 Personal history of malignant neoplasm of other parts of uterus: Secondary | ICD-10-CM | POA: Diagnosis not present

## 2018-05-22 DIAGNOSIS — Z79899 Other long term (current) drug therapy: Secondary | ICD-10-CM | POA: Insufficient documentation

## 2018-05-22 DIAGNOSIS — Z85038 Personal history of other malignant neoplasm of large intestine: Secondary | ICD-10-CM | POA: Diagnosis not present

## 2018-05-22 DIAGNOSIS — Z923 Personal history of irradiation: Secondary | ICD-10-CM | POA: Insufficient documentation

## 2018-05-22 DIAGNOSIS — K6389 Other specified diseases of intestine: Secondary | ICD-10-CM | POA: Diagnosis not present

## 2018-05-22 DIAGNOSIS — Z1211 Encounter for screening for malignant neoplasm of colon: Secondary | ICD-10-CM | POA: Diagnosis not present

## 2018-05-22 DIAGNOSIS — C187 Malignant neoplasm of sigmoid colon: Secondary | ICD-10-CM

## 2018-05-22 DIAGNOSIS — Z510 Encounter for antineoplastic radiation therapy: Secondary | ICD-10-CM | POA: Diagnosis not present

## 2018-05-22 DIAGNOSIS — D122 Benign neoplasm of ascending colon: Secondary | ICD-10-CM | POA: Diagnosis not present

## 2018-05-22 HISTORY — PX: COLONOSCOPY WITH PROPOFOL: SHX5780

## 2018-05-22 HISTORY — DX: Unspecified macular degeneration: H35.30

## 2018-05-22 SURGERY — COLONOSCOPY WITH PROPOFOL
Anesthesia: General

## 2018-05-22 MED ORDER — PROPOFOL 10 MG/ML IV BOLUS
INTRAVENOUS | Status: DC | PRN
  Administered 2018-05-22: 80 mg via INTRAVENOUS

## 2018-05-22 MED ORDER — PROPOFOL 500 MG/50ML IV EMUL
INTRAVENOUS | Status: DC | PRN
  Administered 2018-05-22: 100 ug/kg/min via INTRAVENOUS

## 2018-05-22 MED ORDER — SODIUM CHLORIDE 0.9 % IV SOLN: INTRAVENOUS | Status: DC

## 2018-05-22 MED ORDER — LIDOCAINE HCL (CARDIAC) PF 100 MG/5ML IV SOSY
PREFILLED_SYRINGE | INTRAVENOUS | Status: DC | PRN
  Administered 2018-05-22: 50 mg via INTRATRACHEAL

## 2018-05-22 MED ORDER — ONDANSETRON HCL 4 MG/2ML IJ SOLN
INTRAMUSCULAR | Status: DC | PRN
  Administered 2018-05-22: 4 mg via INTRAVENOUS

## 2018-05-22 MED ORDER — SODIUM CHLORIDE 0.9 % IV SOLN
INTRAVENOUS | Status: DC
  Administered 2018-05-22: 10:00:00 via INTRAVENOUS

## 2018-05-22 NOTE — H&P (Signed)
[name], MD [address] [place], [place] [address] [phone] Fax : [phone]  Primary Care Physician:  Burnard Hawthorne, FNP Primary Gastroenterologist:  Dr. Allen Norris  Pre-Procedure History & Physical: HPI:  [name] is a 77 y.o. female is here for an colonoscopy.   Past Medical History:  Diagnosis Date  . Arthritis    bitateral knees and left foot,s/p cortisone injection in past  . Chronic constipation   . Colon cancer ([place]) 2018   pt is undergoing chemo 11-09-17  . Complication of anesthesia     slow to wake up   . Endometrial cancer ([place]) 2014   Total Hysterectomy and Rad tx's.   . Family history of cancer   . Genetic testing 09/26/2017    Multi-Cancer panel (83 genes) @ [facility] - No pathogenic mutations detected  . GERD (gastroesophageal reflux disease)   . Hypertension   . IBS (irritable bowel syndrome)   . Macular degeneration of both eyes   . Menopause    age 35  . PONV (postoperative nausea and vomiting)   . Vitamin D deficiency 06/07/10    Past Surgical History:  Procedure Laterality Date  . BUNIONECTOMY    . COLONOSCOPY WITH PROPOFOL N/A 04/04/2017   Procedure: COLONOSCOPY WITH PROPOFOL;  Surgeon: [name], MD;  Location[facility] ENDOSCOPY;  Service: Endoscopy;  Laterality: N/A;  . DILATION AND CURETTAGE OF UTERUS  1971  . FOOT SURGERY    . NASAL SINUS SURGERY    . PORTACATH PLACEMENT Right 07/12/2017   Procedure: INSERTION PORT-A-CATH;  Surgeon: Jules Husbands, MD;  Location: [facility] ORS;  Service: General;  Laterality: Right;  . ROBOTIC ASSISTED LAPAROSCOPIC VENTRAL/INCISIONAL HERNIA REPAIR N/A 06/07/2017   Procedure: ROBOTIC INCISIONAL HERNIA REPAIR;  Surgeon: Leighton Ruff, MD;  Location: [facility] ORS;  Service: General;  Laterality: N/A;  . VAGINAL DELIVERY    . VAGINAL HYSTERECTOMY      Prior to Admission medications   Medication Sig Start Date End Date Taking? Authorizing Provider  fluticasone (FLONASE) 50 MCG/ACT nasal  spray Place 2 sprays into both nostrils daily. 08/08/16  Yes Burnard Hawthorne, FNP  losartan (COZAAR) 50 MG tablet TAKE 1 TABLET EVERY DAY 03/23/18  Yes Burnard Hawthorne, FNP  Magnesium 400 MG TABS Take 400 mg by mouth daily as needed (irritable bowel syndrome).   Yes [provider]  metoprolol succinate (TOPROL-XL) 100 MG 24 hr tablet TAKE 1 TABLET DAILY 03/23/18  Yes Arnett, Yvetta Coder, FNP  Multiple Vitamin (MULTIVITAMIN WITH MINERALS) TABS tablet Take 1 tablet by mouth daily.   Yes [provider]  Multiple Vitamins-Minerals (ICAPS AREDS 2 PO) Take 2 capsules by mouth daily.   Yes [provider]  Polyethylene Glycol 3350 (MIRALAX PO) Take 17 g by mouth daily as needed (constipation).    Yes [provider]  simvastatin (ZOCOR) 20 MG tablet TAKE 1 TABLET EVERY EVENING 02/23/18  Yes Arnett, Yvetta Coder, FNP  topiramate (TOPAMAX) 50 MG tablet Take 1 tablet (50 mg total) by mouth 2 (two) times daily. 03/28/18  Yes Arnett, Yvetta Coder, FNP  chlorhexidine (PERIDEX) 0.12 % solution Use as directed 15 mLs in the mouth or throat 2 (two) times daily. Patient not taking: Reported on 03/26/2018 08/21/17   Earlie Server, MD  lidocaine-prilocaine (EMLA) cream Apply 1 application topically as needed. Patient not taking: Reported on 05/22/2018 07/07/17   Earlie Server, MD  ondansetron (ZOFRAN) 8 MG tablet Take 1 tablet (8 mg total)  by mouth 2 (two) times daily as needed for refractory nausea / vomiting. Start on day 3 after chemotherapy. Patient not taking: Reported on 03/26/2018 07/20/17   Earlie Server, MD  prochlorperazine (COMPAZINE) 10 MG tablet Take 1 tablet (10 mg total) by mouth every 6 (six) hours as needed (Nausea or vomiting). Patient not taking: Reported on 03/26/2018 07/20/17   Earlie Server, MD    Allergies as of 05/07/2018 - Review Complete 04/30/2018  Allergen Reaction Noted  . Codeine Nausea And Vomiting 07/08/2011    Family History  Problem Relation Age of Onset  . Heart disease  Father 61       massive MI  . Diabetes Father   . Bipolar disorder Sister   . Osteoporosis Sister   . Breast cancer Sister 81       currently 13  . Cancer Sister        breast  . Bipolar disorder Maternal Aunt   . Breast cancer Maternal Aunt        age at dx unknown; deceased 33s  . Colon cancer Maternal Grandfather 55       deceased 46  . Cancer Maternal Grandfather        breast  . Osteoporosis Mother   . Colon cancer Mother        age at dx unknown; deceased 82  . Cancer Maternal Grandmother        unk. type; deceased 17  . Breast cancer Paternal Aunt        deceased 3    Social History   Socioeconomic History  . Marital status: Divorced    Spouse name: Not on file  . Number of children: Not on file  . Years of education: Not on file  . Highest education level: Not on file  Occupational History  . Not on file  Social Needs  . Financial resource strain: Not on file  . Food insecurity:    Worry: Not on file    Inability: Not on file  . Transportation needs:    Medical: Not on file    Non-medical: Not on file  Tobacco Use  . Smoking status: Never Smoker  . Smokeless tobacco: Never Used  Substance and Sexual Activity  . Alcohol use: Yes    Comment: rarely  . Drug use: No  . Sexual activity: Never  Lifestyle  . Physical activity:    Days per week: Not on file    Minutes per session: Not on file  . Stress: Not on file  Relationships  . Social connections:    Talks on phone: Not on file    Gets together: Not on file    Attends religious service: Not on file    Active member of club or organization: Not on file    Attends meetings of clubs or organizations: Not on file    Relationship status: Not on file  . Intimate partner violence:    Fear of current or ex partner: Not on file    Emotionally abused: Not on file    Physically abused: Not on file    Forced sexual activity: Not on file  Other Topics Concern  . Not on file  Social History Narrative    Exercise- aerobic exercise at home 3-4x week.       Lives in [place]. No pets.    Review of Systems: See HPI, otherwise negative ROS  Physical Exam: BP (!) 150/55   Pulse (!) 55   Temp (!)  95.5 F (35.3 C) (Tympanic)   Resp 18   Ht 5\' 5"  (1.651 m)   Wt 200 lb (90.7 kg)   SpO2 97%   BMI 33.28 kg/m  General:   Alert,  pleasant and cooperative in NAD Head:  Normocephalic and atraumatic. Neck:  Supple; no masses or thyromegaly. Lungs:  Clear throughout to auscultation.    Heart:  Regular rate and rhythm. Abdomen:  Soft, nontender and nondistended. Normal bowel sounds, without guarding, and without rebound.   Neurologic:  Alert and  oriented x4;  grossly normal neurologically.  Impression/Plan: [name] is here for an colonoscopy to be performed for history of colon cancer  Risks, benefits, limitations, and alternatives regarding  colonoscopy have been reviewed with the patient.  Questions have been answered.  All parties agreeable.   [name], MD  05/22/2018, 10:30 AM

## 2018-05-22 NOTE — Op Note (Signed)
[facility] Patient Name: [name] Procedure Date: 05/22/2018 10:36 AM MRN: [id number] Account #: [id number] Date of Birth: 1941/05/15 Admit Type: Outpatient Age: 77 Room: [facility] ENDO ROOM 4 Gender: Female Note Status: Finalized Procedure:            Colonoscopy Indications:          High risk colon cancer surveillance: Personal history                        of colon cancer Providers:            [name] MD, MD Referring MD:         Yvetta Coder. Arnett (Referring MD) Medicines:            Propofol per Anesthesia Complications:        No immediate complications. Procedure:            Pre-Anesthesia Assessment:                       - Prior to the procedure, a History and Physical was                        performed, and patient medications and allergies were                        reviewed. The patient's tolerance of previous                        anesthesia was also reviewed. The risks and benefits of                        the procedure and the sedation options and risks were                        discussed with the patient. All questions were                        answered, and informed consent was obtained. Prior                        Anticoagulants: The patient has taken no previous                        anticoagulant or antiplatelet agents. ASA Grade                        Assessment: II - A patient with mild systemic disease.                        After reviewing the risks and benefits, the patient was                        deemed in satisfactory condition to undergo the                        procedure.                       After obtaining informed consent, the colonoscope was  passed under direct vision. Throughout the procedure,                        the patient's blood pressure, pulse, and oxygen                        saturations were monitored continuously. The                        Colonoscope was  introduced through the anus and                        advanced to the the cecum, identified by appendiceal                        orifice and ileocecal valve. The colonoscopy was                        performed without difficulty. The patient tolerated the                        procedure well. The quality of the bowel preparation                        was excellent. Findings:      The perianal and digital rectal examinations were normal. Pertinent       negatives include normal sphincter tone.      A 10 mm polyp was found in the ascending colon. The polyp was sessile.       Area was successfully injected with 4 mL saline with indigo carmine for       a lift polypectomy. The polyp was removed with a hot snare. Resection       and retrieval were complete. To prevent bleeding post-intervention, two       hemostatic clips were successfully placed (MR conditional). There was no       bleeding at the end of the procedure.      There was evidence of a prior end-to-end colo-colonic anastomosis in the       sigmoid colon. This was patent. Impression:           - One 10 mm polyp in the ascending colon, removed with                        a hot snare. Resected and retrieved. Injected. Clips                        (MR conditional) were placed.                       - Patent end-to-end colo-colonic anastomosis. Recommendation:       - Discharge patient to home.                       - Resume previous diet.                       - Continue present medications.                       - Await pathology results. Procedure Code(s):    --- Professional ---  45385, Colonoscopy, flexible; with removal of tumor(s),                        polyp(s), or other lesion(s) by snare technique                       45381, Colonoscopy, flexible; with directed submucosal                        injection(s), any substance Diagnosis Code(s):    --- Professional ---                       M21.117,  Personal history of other malignant neoplasm                        of large intestine                       D12.2, Benign neoplasm of ascending colon CPT copyright 2017 American Medical Association. All rights reserved. The codes documented in this report are preliminary and upon coder review may  be revised to meet current compliance requirements. [name] MD, MD 05/22/2018 11:15:44 AM This report has been signed electronically. Number of Addenda: 0 Note Initiated On: 05/22/2018 10:36 AM Scope Withdrawal Time: 0 hours 19 minutes 5 seconds  Total Procedure Duration: 0 hours 22 minutes 20 seconds       [facility]

## 2018-05-22 NOTE — Anesthesia Post-op Follow-up Note (Signed)
Anesthesia QCDR form completed.        

## 2018-05-22 NOTE — Transfer of Care (Signed)
Immediate Anesthesia Transfer of Care Note  Patient: [name]  Procedure(s) Performed: COLONOSCOPY WITH PROPOFOL (N/A )  Patient Location: PACU and Endoscopy Unit  Anesthesia Type:General  Level of Consciousness: awake and alert   Airway & Oxygen Therapy: Patient Spontanous Breathing and Patient connected to nasal cannula oxygen  Post-op Assessment: Report given to RN and Post -op Vital signs reviewed and stable  Post vital signs: Reviewed and stable  Last Vitals:  Vitals Value Taken Time  BP 114/54 05/22/2018 11:17 AM  Temp 36.1 C 05/22/2018 11:17 AM  Pulse 62 05/22/2018 11:18 AM  Resp 19 05/22/2018 11:18 AM  SpO2 97 % 05/22/2018 11:18 AM  Vitals shown include unvalidated device data.  Last Pain:  Vitals:   05/22/18 1117  TempSrc: Tympanic  PainSc: 0-No pain         Complications: No apparent anesthesia complications

## 2018-05-22 NOTE — Anesthesia Procedure Notes (Signed)
Date/Time: 05/22/2018 10:39 AM Performed by: Babs Sciara, CRNA Pre-anesthesia Checklist: Patient identified, Emergency Drugs available, Suction available, Patient being monitored and Timeout performed Patient Re-evaluated:Patient Re-evaluated prior to induction Oxygen Delivery Method: Nasal cannula

## 2018-05-22 NOTE — Anesthesia Preprocedure Evaluation (Addendum)
Anesthesia Evaluation  Patient identified by MRN, date of birth, ID band Patient awake    Reviewed: Allergy & Precautions, NPO status , Patient's Chart, lab work & pertinent test results, reviewed documented beta blocker date and time   History of Anesthesia Complications (+) PONV and history of anesthetic complications  Airway Mallampati: II  TM Distance: >3 FB     Dental  (+) Chipped   Pulmonary           Cardiovascular hypertension, Pt. on medications and Pt. on home beta blockers      Neuro/Psych    GI/Hepatic GERD  ,  Endo/Other    Renal/GU      Musculoskeletal  (+) Arthritis ,   Abdominal   Peds  Hematology   Anesthesia Other Findings Did not get nauseated with last colonoscopy.  Reproductive/Obstetrics                            Anesthesia Physical Anesthesia Plan  ASA: III  Anesthesia Plan: General   Post-op Pain Management:    Induction: Intravenous  PONV Risk Score and Plan:   Airway Management Planned:   Additional Equipment:   Intra-op Plan:   Post-operative Plan:   Informed Consent: I have reviewed the patients History and Physical, chart, labs and discussed the procedure including the risks, benefits and alternatives for the proposed anesthesia with the patient or authorized representative who has indicated his/her understanding and acceptance.     Plan Discussed with: CRNA  Anesthesia Plan Comments:         Anesthesia Quick Evaluation

## 2018-05-22 NOTE — Anesthesia Postprocedure Evaluation (Signed)
Anesthesia Post Note  Patient: [name]  Procedure(s) Performed: COLONOSCOPY WITH PROPOFOL (N/A )  Patient location during evaluation: Endoscopy Anesthesia Type: General Level of consciousness: awake and alert Pain management: pain level controlled Vital Signs Assessment: post-procedure vital signs reviewed and stable Respiratory status: spontaneous breathing, nonlabored ventilation, respiratory function stable and patient connected to nasal cannula oxygen Cardiovascular status: blood pressure returned to baseline and stable Postop Assessment: no apparent nausea or vomiting Anesthetic complications: no     Last Vitals:  Vitals:   05/22/18 1015 05/22/18 1117  BP: (!) 150/55 (!) 114/54  Pulse: (!) 55   Resp: 18   Temp: (!) 35.3 C (!) 36.1 C  SpO2: 97%     Last Pain:  Vitals:   05/22/18 1127  TempSrc:   PainSc: 0-No pain                 Mossie Gilder S

## 2018-05-23 ENCOUNTER — Encounter: Payer: Self-pay | Admitting: Family

## 2018-05-23 ENCOUNTER — Encounter: Payer: Self-pay | Admitting: Gastroenterology

## 2018-05-23 LAB — SURGICAL PATHOLOGY

## 2018-05-24 ENCOUNTER — Encounter: Payer: Self-pay | Admitting: Gastroenterology

## 2018-05-28 DIAGNOSIS — H47391 Other disorders of optic disc, right eye: Secondary | ICD-10-CM | POA: Diagnosis not present

## 2018-05-28 DIAGNOSIS — H2513 Age-related nuclear cataract, bilateral: Secondary | ICD-10-CM | POA: Diagnosis not present

## 2018-06-09 ENCOUNTER — Other Ambulatory Visit: Payer: Self-pay | Admitting: Family

## 2018-06-11 NOTE — Telephone Encounter (Signed)
Nov sched lov 04/10/17

## 2018-06-12 NOTE — Telephone Encounter (Signed)
Call pt  I refilled the topamax for her tremor however please ask for future refills that she follow up with Dr Manuella Ghazi ( neurology, last visit 2017). I know she has had a lot on her plate the past couple of years however please ensure she does get back to his office to touch base regarding tremors and topamax.

## 2018-06-18 ENCOUNTER — Inpatient Hospital Stay: Payer: Medicare HMO | Attending: Oncology

## 2018-06-18 DIAGNOSIS — Z95828 Presence of other vascular implants and grafts: Secondary | ICD-10-CM

## 2018-06-18 DIAGNOSIS — Z452 Encounter for adjustment and management of vascular access device: Secondary | ICD-10-CM | POA: Diagnosis not present

## 2018-06-18 DIAGNOSIS — C186 Malignant neoplasm of descending colon: Secondary | ICD-10-CM | POA: Insufficient documentation

## 2018-06-18 MED ORDER — SODIUM CHLORIDE 0.9% FLUSH
10.0000 mL | Freq: Once | INTRAVENOUS | Status: AC
  Administered 2018-06-18: 10 mL via INTRAVENOUS
  Filled 2018-06-18: qty 10

## 2018-06-18 MED ORDER — HEPARIN SOD (PORK) LOCK FLUSH 100 UNIT/ML IV SOLN
500.0000 [IU] | Freq: Once | INTRAVENOUS | Status: AC
  Administered 2018-06-18: 500 [IU] via INTRAVENOUS

## 2018-06-21 NOTE — Telephone Encounter (Signed)
Mychart message sent.

## 2018-07-11 DIAGNOSIS — H40003 Preglaucoma, unspecified, bilateral: Secondary | ICD-10-CM | POA: Diagnosis not present

## 2018-07-11 DIAGNOSIS — H2512 Age-related nuclear cataract, left eye: Secondary | ICD-10-CM | POA: Diagnosis not present

## 2018-07-31 ENCOUNTER — Ambulatory Visit
Admission: RE | Admit: 2018-07-31 | Discharge: 2018-07-31 | Disposition: A | Discharge status: Home / Self Care | Payer: Medicare HMO | Source: Ambulatory Visit | Attending: Oncology | Admitting: Oncology

## 2018-07-31 DIAGNOSIS — I700 Atherosclerosis of aorta: Secondary | ICD-10-CM | POA: Diagnosis not present

## 2018-07-31 DIAGNOSIS — C187 Malignant neoplasm of sigmoid colon: Secondary | ICD-10-CM | POA: Diagnosis not present

## 2018-07-31 DIAGNOSIS — C189 Malignant neoplasm of colon, unspecified: Secondary | ICD-10-CM | POA: Diagnosis not present

## 2018-07-31 DIAGNOSIS — I2510 Atherosclerotic heart disease of native coronary artery without angina pectoris: Secondary | ICD-10-CM | POA: Diagnosis not present

## 2018-07-31 DIAGNOSIS — Z5111 Encounter for antineoplastic chemotherapy: Secondary | ICD-10-CM | POA: Diagnosis not present

## 2018-07-31 LAB — POCT I-STAT CREATININE: Creatinine, Ser: 0.9 mg/dL (ref 0.44–1.00)

## 2018-07-31 MED ORDER — IOPAMIDOL (ISOVUE-300) INJECTION 61%
100.0000 mL | Freq: Once | INTRAVENOUS | Status: AC | PRN
  Administered 2018-07-31: 100 mL via INTRAVENOUS

## 2018-08-01 ENCOUNTER — Ambulatory Visit: Payer: Medicare HMO | Admitting: Oncology

## 2018-08-01 ENCOUNTER — Other Ambulatory Visit: Payer: Medicare HMO

## 2018-08-01 ENCOUNTER — Other Ambulatory Visit: Payer: Self-pay

## 2018-08-01 DIAGNOSIS — C187 Malignant neoplasm of sigmoid colon: Secondary | ICD-10-CM

## 2018-08-02 ENCOUNTER — Inpatient Hospital Stay: Payer: Medicare HMO | Attending: Oncology

## 2018-08-02 ENCOUNTER — Encounter: Payer: Self-pay | Admitting: Oncology

## 2018-08-02 ENCOUNTER — Other Ambulatory Visit: Payer: Self-pay

## 2018-08-02 ENCOUNTER — Inpatient Hospital Stay (HOSPITAL_BASED_OUTPATIENT_CLINIC_OR_DEPARTMENT_OTHER): Payer: Medicare HMO | Admitting: Oncology

## 2018-08-02 VITALS — BP 138/68 | HR 54 | Temp 96.2°F | Wt 211.4 lb

## 2018-08-02 DIAGNOSIS — C186 Malignant neoplasm of descending colon: Secondary | ICD-10-CM | POA: Insufficient documentation

## 2018-08-02 DIAGNOSIS — Z8542 Personal history of malignant neoplasm of other parts of uterus: Secondary | ICD-10-CM | POA: Insufficient documentation

## 2018-08-02 DIAGNOSIS — Z9221 Personal history of antineoplastic chemotherapy: Secondary | ICD-10-CM | POA: Insufficient documentation

## 2018-08-02 DIAGNOSIS — Z95828 Presence of other vascular implants and grafts: Secondary | ICD-10-CM

## 2018-08-02 DIAGNOSIS — C187 Malignant neoplasm of sigmoid colon: Secondary | ICD-10-CM

## 2018-08-02 DIAGNOSIS — Z923 Personal history of irradiation: Secondary | ICD-10-CM

## 2018-08-02 LAB — CBC WITH DIFFERENTIAL/PLATELET
BASOS ABS: 0 10*3/uL (ref 0–0.1)
BASOS PCT: 1 %
EOS ABS: 0.2 10*3/uL (ref 0–0.7)
EOS PCT: 4 %
HCT: 34.4 % — ABNORMAL LOW (ref 35.0–47.0)
HEMOGLOBIN: 11.6 g/dL — AB (ref 12.0–16.0)
Lymphocytes Relative: 27 %
Lymphs Abs: 1.2 10*3/uL (ref 1.0–3.6)
MCH: 29.2 pg (ref 26.0–34.0)
MCHC: 33.8 g/dL (ref 32.0–36.0)
MCV: 86.5 fL (ref 80.0–100.0)
Monocytes Absolute: 0.4 10*3/uL (ref 0.2–0.9)
Monocytes Relative: 8 %
Neutro Abs: 2.7 10*3/uL (ref 1.4–6.5)
Neutrophils Relative %: 60 %
PLATELETS: 183 10*3/uL (ref 150–440)
RBC: 3.97 MIL/uL (ref 3.80–5.20)
RDW: 15.3 % — ABNORMAL HIGH (ref 11.5–14.5)
WBC: 4.4 10*3/uL (ref 3.6–11.0)

## 2018-08-02 LAB — COMPREHENSIVE METABOLIC PANEL
ALBUMIN: 3.7 g/dL (ref 3.5–5.0)
ALT: 19 U/L (ref 0–44)
AST: 24 U/L (ref 15–41)
Alkaline Phosphatase: 90 U/L (ref 38–126)
Anion gap: 6 (ref 5–15)
BILIRUBIN TOTAL: 0.8 mg/dL (ref 0.3–1.2)
BUN: 14 mg/dL (ref 8–23)
CALCIUM: 8.8 mg/dL — AB (ref 8.9–10.3)
CO2: 24 mmol/L (ref 22–32)
CREATININE: 0.78 mg/dL (ref 0.44–1.00)
Chloride: 111 mmol/L (ref 98–111)
GFR calc Af Amer: >60 mL/min (ref >60)
Glucose, Bld: 88 mg/dL (ref 70–99)
Potassium: 4 mmol/L (ref 3.5–5.1)
SODIUM: 141 mmol/L (ref 135–145)
TOTAL PROTEIN: 6.6 g/dL (ref 6.5–8.1)

## 2018-08-02 MED ORDER — HEPARIN SOD (PORK) LOCK FLUSH 100 UNIT/ML IV SOLN
500.0000 [IU] | Freq: Once | INTRAVENOUS | Status: AC
  Administered 2018-08-02: 500 [IU]
  Filled 2018-08-02: qty 5

## 2018-08-02 MED ORDER — SODIUM CHLORIDE 0.9% FLUSH
10.0000 mL | Freq: Once | INTRAVENOUS | Status: AC
  Administered 2018-08-02: 10 mL via INTRAVENOUS
  Filled 2018-08-02: qty 10

## 2018-08-02 NOTE — Progress Notes (Signed)
Patient here today for follow up.  Patient states no new concerns today  

## 2018-08-02 NOTE — Progress Notes (Signed)
Hematology/Oncology Follow Up visit. [place] [phone] [phone]  CONSULT NOTE Patient Care Team: Burnard Hawthorne, FNP as PCP - General (Family Medicine) Vladimir Crofts, MD (Neurology) Noreene Filbert, MD as Referring Physician (Radiation Oncology) Leighton Ruff, MD as Consulting Physician (General Surgery) Earlie Server, MD as Consulting Physician (Oncology) Jules Husbands, MD as Consulting Physician (General Surgery)  PURPOSE OF CONSULTATION:  Follow up for treatment of Colon cancer  HISTORY OF PRESENTING ILLNESS:  [name] 77 y.o.  female with PMH listed as below was referred to me for evaluation and management of colon cancer.  She had colonoscopy evaluation due to positive cologuard.  She was found to have a nonobstructing mass approximately 20 cm from the anal verge which biopsy proved to be adenocarcinoma. CT scan showed a large mass from the sigmoid and extends beyond the wall of the colon toward the left into the surrounding fat. Preoperational CEA was normal. Patient underwent robotic sigmoidectomy on 06/07/2017.   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 MLH1  loss of function, PMS2 loss of function. Declined option of clinical trial ATOMIC She has a history of endometrial cancer s/p hysterotomy and radiation. She has a family history of colon cancer and breast cancer.  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 Results have been explained to patient by genetic counselor.   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   INTERVAL HISTORY 77 yo female with above history presents for follow up of Stage IIIB colon cancer.  S/p adjuvant chemotherapy and radiation.  RT finished in April 2019.   Reports feeling well today.  Continue to have chronic  diarrhea, worsened after taking oral contrast for CT scan. Manageable with imodium as needed.  Fatigue is better.     Review of Systems  Constitutional: Negative for appetite change, chills, fatigue and fever.  HENT:   Negative for hearing loss, lump/mass, mouth sores, nosebleeds and sore throat.   Eyes:       Dry eyes and runny eyes.  Respiratory: Negative for chest tightness, cough and shortness of breath.   Cardiovascular: Negative for chest pain and leg swelling.  Gastrointestinal: Negative for abdominal pain, blood in stool, constipation, diarrhea and nausea.  Endocrine: Negative for hot flashes.  Genitourinary: Negative for dysuria, hematuria, nocturia and pelvic pain.   Musculoskeletal: Negative for arthralgias.  Skin: Negative for itching, rash and wound.  Neurological: Negative for headaches and numbness.  Hematological: Negative for adenopathy.  Psychiatric/Behavioral: Negative for confusion. The patient is not nervous/anxious.    MEDICAL HISTORY:  Past Medical History:  Diagnosis Date  . Arthritis    bitateral knees and left foot,s/p cortisone injection in past  . Chronic constipation   . Colon cancer ([place]) 2018   pt is undergoing chemo 11-09-17  . Complication of anesthesia     slow to wake up   . Endometrial cancer ([place]) 2014   Total Hysterectomy and Rad tx's.   . Family history of cancer   . Genetic testing 09/26/2017    Multi-Cancer panel (83 genes) @ [facility] - No pathogenic mutations detected  . GERD (gastroesophageal reflux disease)   . Hypertension   . IBS (irritable bowel syndrome)   . Macular degeneration of both eyes   . Menopause    age 65  . PONV (  postoperative nausea and vomiting)   . Vitamin D deficiency 06/07/10    SURGICAL HISTORY: Past Surgical History:  Procedure Laterality Date  . BUNIONECTOMY    . COLONOSCOPY WITH PROPOFOL N/A 04/04/2017   Procedure: COLONOSCOPY WITH PROPOFOL;  Surgeon: [name], MD;  Location: [facility] ENDOSCOPY;   Service: Endoscopy;  Laterality: N/A;  . COLONOSCOPY WITH PROPOFOL N/A 05/22/2018   Procedure: COLONOSCOPY WITH PROPOFOL;  Surgeon: [name], MD;  Location: [facility] ENDOSCOPY;  Service: Endoscopy;  Laterality: N/A;  . DILATION AND CURETTAGE OF UTERUS  1971  . FOOT SURGERY    . NASAL SINUS SURGERY    . PORTACATH PLACEMENT Right 07/12/2017   Procedure: INSERTION PORT-A-CATH;  Surgeon: Jules Husbands, MD;  Location: [facility] ORS;  Service: General;  Laterality: Right;  . ROBOTIC ASSISTED LAPAROSCOPIC VENTRAL/INCISIONAL HERNIA REPAIR N/A 06/07/2017   Procedure: ROBOTIC INCISIONAL HERNIA REPAIR;  Surgeon: Leighton Ruff, MD;  Location: [facility] ORS;  Service: General;  Laterality: N/A;  . VAGINAL DELIVERY    . VAGINAL HYSTERECTOMY      SOCIAL HISTORY: Social History   Socioeconomic History  . Marital status: Divorced    Spouse name: Not on file  . Number of children: Not on file  . Years of education: Not on file  . Highest education level: Not on file  Occupational History  . Not on file  Social Needs  . Financial resource strain: Not on file  . Food insecurity:    Worry: Not on file    Inability: Not on file  . Transportation needs:    Medical: Not on file    Non-medical: Not on file  Tobacco Use  . Smoking status: Never Smoker  . Smokeless tobacco: Never Used  Substance and Sexual Activity  . Alcohol use: Yes    Comment: rarely  . Drug use: No  . Sexual activity: Never  Lifestyle  . Physical activity:    Days per week: Not on file    Minutes per session: Not on file  . Stress: Not on file  Relationships  . Social connections:    Talks on phone: Not on file    Gets together: Not on file    Attends religious service: Not on file    Active member of club or organization: Not on file    Attends meetings of clubs or organizations: Not on file    Relationship status: Not on file  . Intimate partner violence:    Fear of current or ex partner: Not on file    Emotionally abused: Not  on file    Physically abused: Not on file    Forced sexual activity: Not on file  Other Topics Concern  . Not on file  Social History Narrative   Exercise- aerobic exercise at home 3-4x week.       Lives in [place]. No pets.    FAMILY HISTORY: Family History  Problem Relation Age of Onset  . Heart disease Father 66       massive MI  . Diabetes Father   . Bipolar disorder Sister   . Osteoporosis Sister   . Breast cancer Sister 40       currently 74  . Cancer Sister        breast  . Bipolar disorder Maternal Aunt   . Breast cancer Maternal Aunt        age at dx unknown; deceased 46s  . Colon cancer Maternal Grandfather 66       deceased  21  . Cancer Maternal Grandfather        breast  . Osteoporosis Mother   . Colon cancer Mother        age at dx unknown; deceased 2  . Cancer Maternal Grandmother        unk. type; deceased 27  . Breast cancer Paternal Aunt        deceased 1    ALLERGIES:  is allergic to codeine.  MEDICATIONS:  Current Outpatient Medications  Medication Sig Dispense Refill  . chlorhexidine (PERIDEX) 0.12 % solution Use as directed 15 mLs in the mouth or throat 2 (two) times daily. 473 mL 0  . fluticasone (FLONASE) 50 MCG/ACT nasal spray Place 2 sprays into both nostrils daily. 16 g 2  . lidocaine-prilocaine (EMLA) cream Apply 1 application topically as needed. 30 g 0  . losartan (COZAAR) 50 MG tablet TAKE 1 TABLET EVERY DAY 90 tablet 3  . Magnesium 400 MG TABS Take 400 mg by mouth daily as needed (irritable bowel syndrome).    . metoprolol succinate (TOPROL-XL) 100 MG 24 hr tablet TAKE 1 TABLET DAILY 90 tablet 3  . Multiple Vitamin (MULTIVITAMIN WITH MINERALS) TABS tablet Take 1 tablet by mouth daily.    . Multiple Vitamins-Minerals (ICAPS AREDS 2 PO) Take 2 capsules by mouth daily.    . ondansetron (ZOFRAN) 8 MG tablet Take 1 tablet (8 mg total) by mouth 2 (two) times daily as needed for refractory nausea / vomiting. Start on day 3 after  chemotherapy. 30 tablet 1  . Polyethylene Glycol 3350 (MIRALAX PO) Take 17 g by mouth daily as needed (constipation).     . prochlorperazine (COMPAZINE) 10 MG tablet Take 1 tablet (10 mg total) by mouth every 6 (six) hours as needed (Nausea or vomiting). 30 tablet 1  . simvastatin (ZOCOR) 20 MG tablet TAKE 1 TABLET EVERY EVENING 90 tablet 4  . topiramate (TOPAMAX) 50 MG tablet TAKE 1 TABLET TWICE DAILY 180 tablet 0   No current facility-administered medications for this visit.    Facility-Administered Medications Ordered in Other Visits  Medication Dose Route Frequency Provider Last Rate Last Dose  . sodium chloride flush (NS) 0.9 % injection 10 mL  10 mL Intracatheter PRN Earlie Server, MD   10 mL at 11/08/17 1400      .  PHYSICAL EXAMINATION: ECOG PERFORMANCE STATUS: 0 - Asymptomatic Vitals:   08/02/18 1458  BP: 138/68  Pulse: (!) 54  Temp: (!) 96.2 F (35.7 C)  SpO2: 97%   Filed Weights   08/02/18 1458  Weight: 211 lb 6 oz (95.9 kg)   Physical Exam  Constitutional: She is oriented to person, place, and time. She appears well-developed and well-nourished. No distress.  HENT:  Head: Normocephalic and atraumatic.  Mouth/Throat: Oropharynx is clear and moist. No oropharyngeal exudate.  Eyes: Pupils are equal, round, and reactive to light. Conjunctivae and EOM are normal. No scleral icterus.  Neck: Normal range of motion. Neck supple.  Cardiovascular: Normal rate, regular rhythm and normal heart sounds.  No murmur heard. Pulmonary/Chest: Effort normal and breath sounds normal. No stridor. No respiratory distress. She has no wheezes. She has no rales. She exhibits no tenderness.  Abdominal: Soft. Bowel sounds are normal. She exhibits no distension and no mass. There is no tenderness. There is no guarding.  Musculoskeletal: Normal range of motion. She exhibits no edema or deformity.  Lymphadenopathy:    She has no cervical adenopathy.  Neurological: She is alert and oriented  to  person, place, and time. No cranial nerve deficit. Coordination normal.  Skin: Skin is warm and dry. No rash noted. No erythema.  Psychiatric: She has a normal mood and affect. Her behavior is normal. Thought content normal.    LABORATORY DATA:  I have reviewed the data as listed Lab Results  Component Value Date   WBC 4.4 08/02/2018   HGB 11.6 (L) 08/02/2018   HCT 34.4 (L) 08/02/2018   MCV 86.5 08/02/2018   PLT 183 08/02/2018   Recent Labs    01/29/18 1437 04/30/18 1436 07/31/18 1109 08/02/18 1436  NA 139 138  --  141  K 3.6 4.1  --  4.0  CL 109 113*  --  111  CO2 22 21*  --  24  GLUCOSE 162* 93  --  88  BUN 19 18  --  14  CREATININE 0.82 0.86 0.90 0.78  CALCIUM 9.0 9.2  --  8.8*  GFRNONAA >60 >60  --  >60  GFRAA >60 >60  --  >60  PROT 6.8 6.6  --  6.6  ALBUMIN 3.5 3.7  --  3.7  AST 27 25  --  24  ALT 21 22  --  19  ALKPHOS 123 97  --  90  BILITOT 0.5 0.7  --  0.8    RADIOGRAPHIC STUDIES: I have personally reviewed the radiological images as listed and agreed with the findings in the report. 04/12/2017 CT abdomen pelvis w contrast Large mass arising from the sigmoid colon. This mass extends beyond the wall of the colon toward the left into the surrounding fat. This lesion does not reach the left pelvic sidewall.No adenopathy or liver lesions evident. No omental lesions appreciable. Sizable midline ventral hernia slightly superior to theumbilicus containing fat and a loop of colon without bowel compromise. Much smaller midline umbilical ventral hernia containingonly fat. There is a small hiatal hernia. No bowel obstruction.  No abscess.  Appendix region appears normal. No renal or ureteral calculus.  No hydronephrosis.There is aortoiliac atherosclerosis. There are foci of coronary artery calcification.  RADIOGRAPHIC STUDIES: I have personally reviewed the radiological images as listed and agreed with the findings in the report. No evidence of recurrent or metastatic  carcinoma, or other significant abnormality.  Aortic and coronary artery atherosclerosis incidentally noted   ASSESSMENT & PLAN:  77 y.o.female who has Stage IIIB colon cancer.  Cancer Staging Cancer of descending colon [facility]) Staging form: Colon and Rectum, AJCC 8th Edition - Pathologic: Stage IIIB (pT4a, pN1, cM0) - Signed by Earlie Server, MD on 04/30/2018  1. Malignant neoplasm of sigmoid colon ([place])   2. Port-A-Cath in place    #CT scan was independantly revewied and discussed with patient. No evidence of disease recurrent or mets.  Labs reviewed. Stable. CEA normal .  She is s/p colonoscopy during the interval with findings of benign polyp, resected.   Continue port A cath. Discussed with patient. Medi port does not bother her. Discussed about keep for a total of 2 years with flushing every 6-8 weeks.   Repeat labs in 3 months.  We spent sufficient time to discuss many aspect of care, questions were answered to patient's satisfaction. Return of visit: 3 months.   Total face to face encounter time for this patient visit was 41mn. >50% of the time was  spent in counseling and coordination of care.  ZEarlie Server MD, PhD Hematology Oncology CChan Soon Shiong [facility] Windberat AKindred Hospital South BayPager- [phone]/19/2019

## 2018-08-03 LAB — CEA: CEA: 1.4 ng/mL (ref 0.0–4.7)

## 2018-08-08 ENCOUNTER — Encounter: Payer: Self-pay | Admitting: Family

## 2018-08-09 IMAGING — MG MM DIGITAL SCREENING BILAT W/ TOMO W/ CAD
8 of 12 series · 8 of 28 positions shown · non-contrast
Comparison: Previous exam(s).

CLINICAL DATA: Screening.

EXAM:
2D DIGITAL SCREENING BILATERAL MAMMOGRAM WITH CAD AND ADJUNCT TOMO

[R MLO]
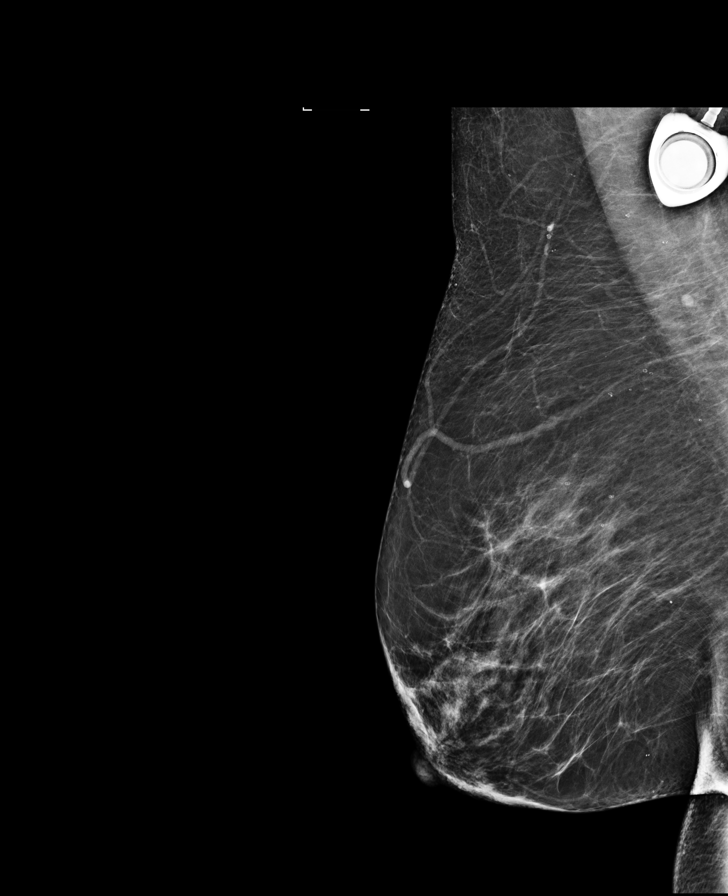

[R MLO synth-2D]
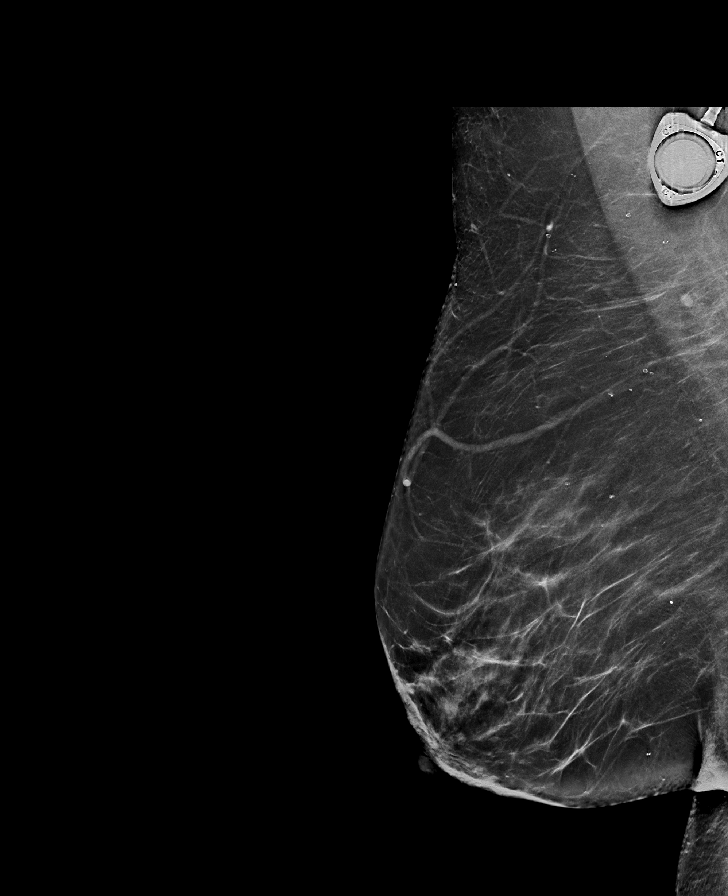

[R CC synth-2D]
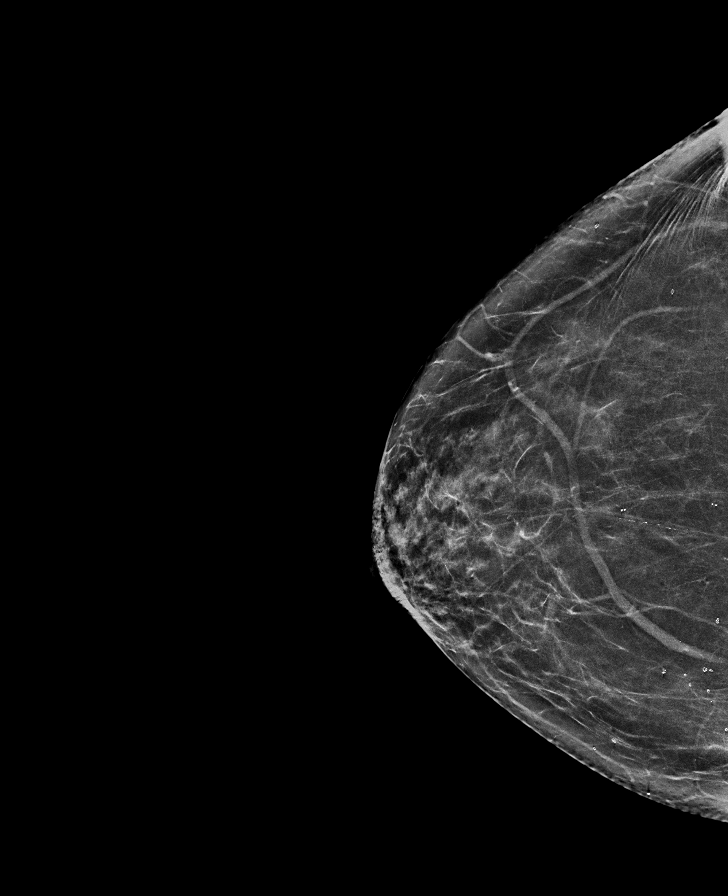

[L CC synth-2D]
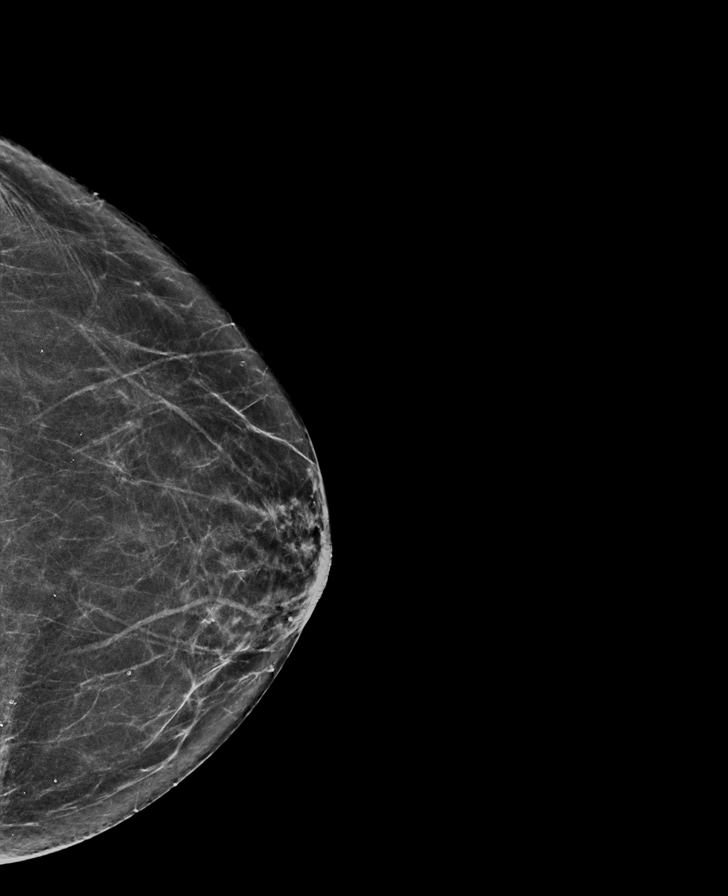

[L MLO]
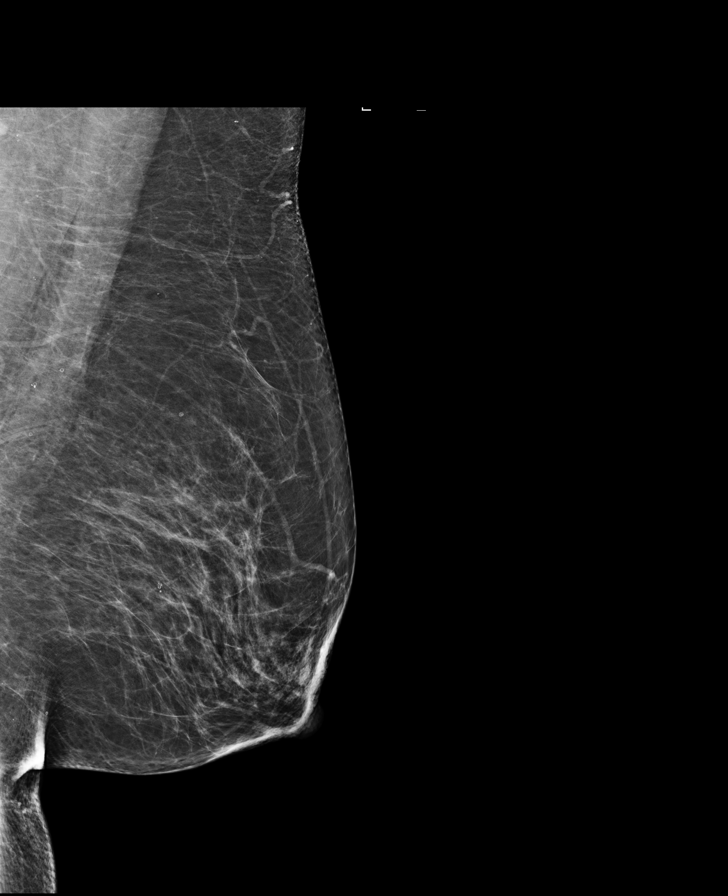

[L CC]
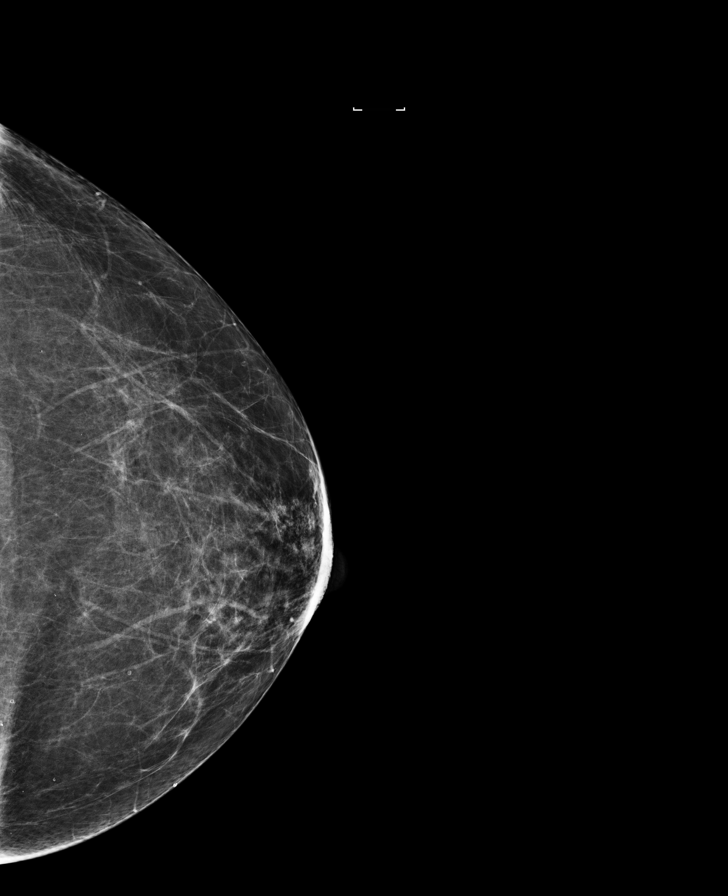

[L MLO synth-2D]
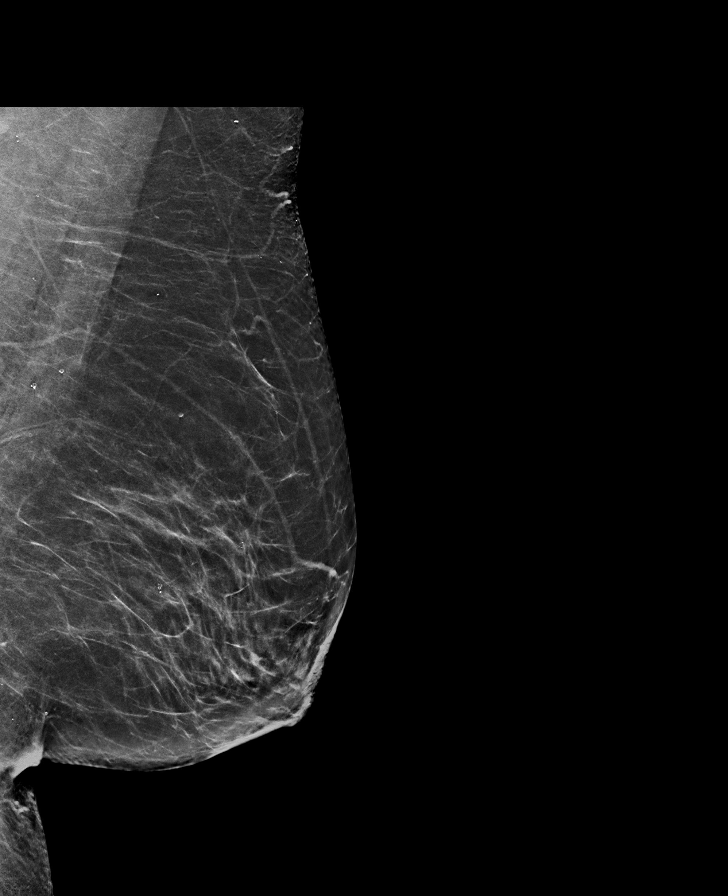

[R CC]
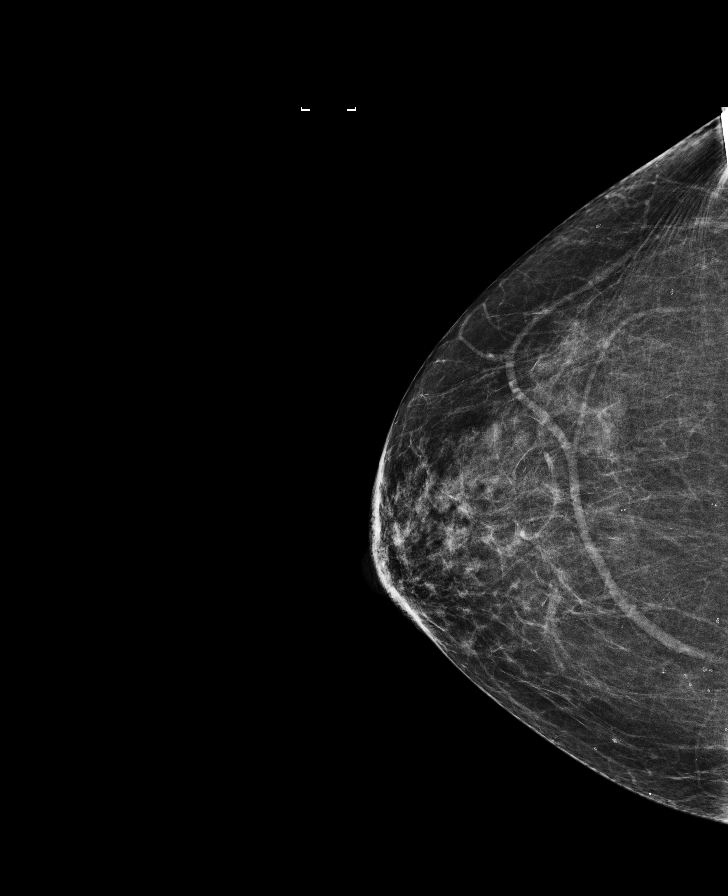

[8 of 28 positions shown; findings below may reference images not displayed]

ACR Breast Density Category b: There are scattered areas of
fibroglandular density.
FINDINGS: There are no findings suspicious for malignancy. Images were
processed with CAD.
IMPRESSION: No mammographic evidence of malignancy. A result letter of this
screening mammogram will be mailed directly to the patient.

RECOMMENDATION:
Screening mammogram in one year. (Code:97-6-RS4)

BI-RADS CATEGORY  1: Negative.

## 2018-09-10 DIAGNOSIS — H2512 Age-related nuclear cataract, left eye: Secondary | ICD-10-CM | POA: Diagnosis not present

## 2018-09-13 ENCOUNTER — Inpatient Hospital Stay: Payer: Medicare HMO | Attending: Oncology

## 2018-09-13 DIAGNOSIS — Z9221 Personal history of antineoplastic chemotherapy: Secondary | ICD-10-CM | POA: Insufficient documentation

## 2018-09-13 DIAGNOSIS — Z923 Personal history of irradiation: Secondary | ICD-10-CM | POA: Diagnosis not present

## 2018-09-13 DIAGNOSIS — Z8542 Personal history of malignant neoplasm of other parts of uterus: Secondary | ICD-10-CM | POA: Insufficient documentation

## 2018-09-13 DIAGNOSIS — C186 Malignant neoplasm of descending colon: Secondary | ICD-10-CM | POA: Insufficient documentation

## 2018-09-13 DIAGNOSIS — Z452 Encounter for adjustment and management of vascular access device: Secondary | ICD-10-CM | POA: Diagnosis not present

## 2018-09-13 DIAGNOSIS — Z95828 Presence of other vascular implants and grafts: Secondary | ICD-10-CM

## 2018-09-13 MED ORDER — HEPARIN SOD (PORK) LOCK FLUSH 100 UNIT/ML IV SOLN
500.0000 [IU] | Freq: Once | INTRAVENOUS | Status: AC
  Administered 2018-09-13: 500 [IU] via INTRAVENOUS

## 2018-09-13 MED ORDER — SODIUM CHLORIDE 0.9% FLUSH
10.0000 mL | Freq: Once | INTRAVENOUS | Status: AC
  Administered 2018-09-13: 10 mL via INTRAVENOUS
  Filled 2018-09-13: qty 10

## 2018-09-13 MED ORDER — HEPARIN SOD (PORK) LOCK FLUSH 100 UNIT/ML IV SOLN
INTRAVENOUS | Status: AC
  Filled 2018-09-13: qty 5

## 2018-09-17 ENCOUNTER — Encounter: Payer: Self-pay | Admitting: *Deleted

## 2018-09-17 ENCOUNTER — Other Ambulatory Visit: Payer: Self-pay

## 2018-09-20 NOTE — Discharge Instructions (Signed)

## 2018-09-24 ENCOUNTER — Encounter
Admission: RE | Disposition: A | Discharge status: Home / Self Care | Payer: Self-pay | Source: Ambulatory Visit | Attending: Ophthalmology

## 2018-09-24 ENCOUNTER — Ambulatory Visit: Payer: Medicare HMO | Admitting: Anesthesiology

## 2018-09-24 ENCOUNTER — Ambulatory Visit
Admission: RE | Admit: 2018-09-24 | Discharge: 2018-09-24 | Disposition: A | Discharge status: Home / Self Care | Payer: Medicare HMO | Source: Ambulatory Visit | Attending: Ophthalmology | Admitting: Ophthalmology

## 2018-09-24 DIAGNOSIS — Z8542 Personal history of malignant neoplasm of other parts of uterus: Secondary | ICD-10-CM | POA: Insufficient documentation

## 2018-09-24 DIAGNOSIS — H2512 Age-related nuclear cataract, left eye: Secondary | ICD-10-CM | POA: Insufficient documentation

## 2018-09-24 DIAGNOSIS — H25812 Combined forms of age-related cataract, left eye: Secondary | ICD-10-CM | POA: Diagnosis not present

## 2018-09-24 DIAGNOSIS — Z85038 Personal history of other malignant neoplasm of large intestine: Secondary | ICD-10-CM | POA: Diagnosis not present

## 2018-09-24 DIAGNOSIS — I10 Essential (primary) hypertension: Secondary | ICD-10-CM | POA: Diagnosis not present

## 2018-09-24 HISTORY — PX: CATARACT EXTRACTION W/PHACO: SHX586

## 2018-09-24 SURGERY — PHACOEMULSIFICATION, CATARACT, WITH IOL INSERTION
Anesthesia: Monitor Anesthesia Care | Laterality: Left

## 2018-09-24 MED ORDER — EPINEPHRINE PF 1 MG/ML IJ SOLN
INTRAOCULAR | Status: DC | PRN
  Administered 2018-09-24: 70 mL via OPHTHALMIC

## 2018-09-24 MED ORDER — SODIUM HYALURONATE 10 MG/ML IO SOLN
INTRAOCULAR | Status: DC | PRN
  Administered 2018-09-24: 0.55 mL via INTRAOCULAR

## 2018-09-24 MED ORDER — ARMC OPHTHALMIC DILATING DROPS
1.0000 "application " | OPHTHALMIC | Status: DC | PRN
  Administered 2018-09-24 (×3): 1 via OPHTHALMIC

## 2018-09-24 MED ORDER — MIDAZOLAM HCL 2 MG/2ML IJ SOLN
INTRAMUSCULAR | Status: DC | PRN
  Administered 2018-09-24: 2 mg via INTRAVENOUS

## 2018-09-24 MED ORDER — MOXIFLOXACIN HCL 0.5 % OP SOLN
OPHTHALMIC | Status: DC | PRN
  Administered 2018-09-24: 0.2 mL via OPHTHALMIC

## 2018-09-24 MED ORDER — SODIUM HYALURONATE 23 MG/ML IO SOLN
INTRAOCULAR | Status: DC | PRN
  Administered 2018-09-24: 0.6 mL via INTRAOCULAR

## 2018-09-24 MED ORDER — DEXAMETHASONE SODIUM PHOSPHATE 10 MG/ML IJ SOLN
INTRAMUSCULAR | Status: DC | PRN
  Administered 2018-09-24: 4 mg via INTRAVENOUS

## 2018-09-24 MED ORDER — ONDANSETRON HCL 4 MG/2ML IJ SOLN
INTRAMUSCULAR | Status: DC | PRN
  Administered 2018-09-24: 4 mg via INTRAVENOUS

## 2018-09-24 MED ORDER — LIDOCAINE HCL (PF) 2 % IJ SOLN
INTRAOCULAR | Status: DC | PRN
  Administered 2018-09-24: 1 mL via INTRAOCULAR

## 2018-09-24 MED ORDER — TETRACAINE HCL 0.5 % OP SOLN
1.0000 [drp] | OPHTHALMIC | Status: DC | PRN
  Administered 2018-09-24 (×2): 1 [drp] via OPHTHALMIC

## 2018-09-24 MED ORDER — ACETAMINOPHEN 10 MG/ML IV SOLN
INTRAVENOUS | Status: DC | PRN
  Administered 2018-09-24: 1000 mg via INTRAVENOUS

## 2018-09-24 SURGICAL SUPPLY — 17 items
CANNULA ANT/CHMB 27GA (MISCELLANEOUS) ×3 IMPLANT
DISSECTOR HYDRO NUCLEUS 50X22 (MISCELLANEOUS) ×3 IMPLANT
GLOVE SURG LX 7.5 STRW (GLOVE) ×2
GLOVE SURG LX STRL 7.5 STRW (GLOVE) ×1 IMPLANT
GLOVE SURG SYN 8.5  E (GLOVE) ×2
GLOVE SURG SYN 8.5 E (GLOVE) ×1 IMPLANT
GOWN STRL REUS W/ TWL LRG LVL3 (GOWN DISPOSABLE) ×2 IMPLANT
GOWN STRL REUS W/TWL LRG LVL3 (GOWN DISPOSABLE) ×4
LENS IOL TECNIS ITEC 18.5 (Intraocular Lens) ×3 IMPLANT
MARKER SKIN DUAL TIP RULER LAB (MISCELLANEOUS) ×3 IMPLANT
PACK DR. KING ARMS (PACKS) ×3 IMPLANT
PACK EYE AFTER SURG (MISCELLANEOUS) ×3 IMPLANT
PACK OPTHALMIC (MISCELLANEOUS) ×3 IMPLANT
SYR 3ML LL SCALE MARK (SYRINGE) ×3 IMPLANT
SYR TB 1ML LUER SLIP (SYRINGE) ×3 IMPLANT
WATER STERILE IRR 500ML POUR (IV SOLUTION) ×3 IMPLANT
WIPE NON LINTING 3.25X3.25 (MISCELLANEOUS) ×3 IMPLANT

## 2018-09-24 NOTE — Op Note (Signed)
OPERATIVE NOTE  [name] [id number] 09/24/2018   PREOPERATIVE DIAGNOSIS:  Nuclear sclerotic cataract left eye.  H25.12   POSTOPERATIVE DIAGNOSIS:    Nuclear sclerotic cataract left eye.     PROCEDURE:  Phacoemusification with posterior chamber intraocular lens placement of the left eye   LENS:   Implant Name Type Inv. Item Serial No. Manufacturer Lot No. LRB No. Used  LENS IOL DIOP 18.5 - [id number] Intraocular Lens LENS IOL DIOP 18.5 [id number] AMO  Left 1       PCB00 +18.5   ULTRASOUND TIME: 0 minutes 36.8 seconds.  CDE 4.07   SURGEON:  Benay Pillow, MD, MPH   ANESTHESIA:  Topical with tetracaine drops augmented with 1% preservative-free intracameral lidocaine.  ESTIMATED BLOOD LOSS: <1 mL   COMPLICATIONS:  None.   DESCRIPTION OF PROCEDURE:  The patient was identified in the holding room and transported to the operating room and placed in the supine position under the operating microscope.  The left eye was identified as the operative eye and it was prepped and draped in the usual sterile ophthalmic fashion.   A 1.0 millimeter clear-corneal paracentesis was made at the 5:00 position. 0.5 ml of preservative-free 1% lidocaine with epinephrine was injected into the anterior chamber.  The anterior chamber was filled with Healon 5 viscoelastic.  A 2.4 millimeter keratome was used to make a near-clear corneal incision at the 2:00 position.  A curvilinear capsulorrhexis was made with a cystotome and capsulorrhexis forceps.  Balanced salt solution was used to hydrodissect and hydrodelineate the nucleus.   Phacoemulsification was then used in stop and chop fashion to remove the lens nucleus and epinucleus.  The remaining cortex was then removed using the irrigation and aspiration handpiece. Healon was then placed into the capsular bag to distend it for lens placement.  A lens was then injected into the capsular bag.  The remaining viscoelastic was aspirated.   Wounds were hydrated  with balanced salt solution.  The anterior chamber was inflated to a physiologic pressure with balanced salt solution.  Intracameral vigamox 0.1 mL undiltued was injected into the eye and a drop placed onto the ocular surface.  No wound leaks were noted.  The patient was taken to the recovery room in stable condition without complications of anesthesia or surgery  Benay Pillow 09/24/2018, 11:14 AM

## 2018-09-24 NOTE — H&P (Signed)
The History and Physical notes are on paper, have been signed, and are to be scanned.   I have examined the patient and there are no changes to the H&P.   Benay Pillow 09/24/2018 10:41 AM

## 2018-09-24 NOTE — Transfer of Care (Addendum)
Immediate Anesthesia Transfer of Care Note  Patient: [name]  Procedure(s) Performed: CATARACT EXTRACTION PHACO AND INTRAOCULAR LENS PLACEMENT (IOC) LEFT (Left )  Patient Location: PACU  Anesthesia Type: MAC  Level of Consciousness: awake, alert  and patient cooperative  Airway and Oxygen Therapy: Patient Spontanous Breathing and Patient connected to supplemental oxygen  Post-op Assessment: Post-op Vital signs reviewed, Patient's Cardiovascular Status Stable, Respiratory Function Stable, Patent Airway and No signs of Nausea or vomiting  Post-op Vital Signs: Reviewed and stable  Complications: No apparent anesthesia complications

## 2018-09-24 NOTE — Anesthesia Preprocedure Evaluation (Signed)
Anesthesia Evaluation  Patient identified by MRN, date of birth, ID band Patient awake    Reviewed: Allergy & Precautions, H&P , NPO status , Patient's Chart, lab work & pertinent test results, reviewed documented beta blocker date and time   History of Anesthesia Complications (+) PONV and history of anesthetic complications  Airway Mallampati: II  TM Distance: >3 FB Neck ROM: full    Dental no notable dental hx.    Pulmonary neg pulmonary ROS,    Pulmonary exam normal breath sounds clear to auscultation       Cardiovascular Exercise Tolerance: Good hypertension,  Rhythm:regular Rate:Normal     Neuro/Psych negative neurological ROS  negative psych ROS   GI/Hepatic Neg liver ROS, GERD  ,IBS   Endo/Other  negative endocrine ROS  Renal/GU negative Renal ROS  negative genitourinary   Musculoskeletal   Abdominal   Peds  Hematology negative hematology ROS (+)   Anesthesia Other Findings   Reproductive/Obstetrics negative OB ROS                             Anesthesia Physical Anesthesia Plan  ASA: II  Anesthesia Plan: MAC   Post-op Pain Management:    Induction:   PONV Risk Score and Plan:   Airway Management Planned:   Additional Equipment:   Intra-op Plan:   Post-operative Plan:   Informed Consent: I have reviewed the patients History and Physical, chart, labs and discussed the procedure including the risks, benefits and alternatives for the proposed anesthesia with the patient or authorized representative who has indicated his/her understanding and acceptance.   Dental Advisory Given  Plan Discussed with: CRNA  Anesthesia Plan Comments:         Anesthesia Quick Evaluation

## 2018-09-24 NOTE — Anesthesia Procedure Notes (Signed)
Procedure Name: MAC Performed by: Chayton Murata M, CRNA Pre-anesthesia Checklist: Patient identified, Emergency Drugs available, Suction available, Patient being monitored and Timeout performed Patient Re-evaluated:Patient Re-evaluated prior to induction Oxygen Delivery Method: Nasal cannula       

## 2018-09-24 NOTE — Anesthesia Postprocedure Evaluation (Deleted)
Anesthesia Post Note  Patient: [name]  Procedure(s) Performed: CATARACT EXTRACTION PHACO AND INTRAOCULAR LENS PLACEMENT (IOC) LEFT (Left )  Patient location during evaluation: PACU Anesthesia Type: MAC Level of consciousness: awake and alert Pain management: pain level controlled Vital Signs Assessment: post-procedure vital signs reviewed and stable Respiratory status: spontaneous breathing, nonlabored ventilation, respiratory function stable and patient connected to nasal cannula oxygen Cardiovascular status: stable and blood pressure returned to baseline Postop Assessment: no apparent nausea or vomiting Anesthetic complications: no    Unita Detamore B Kadir Azucena      

## 2018-09-24 NOTE — Anesthesia Postprocedure Evaluation (Signed)
Anesthesia Post Note  Patient: [name]  Procedure(s) Performed: CATARACT EXTRACTION PHACO AND INTRAOCULAR LENS PLACEMENT (IOC) LEFT (Left )  Patient location during evaluation: PACU Anesthesia Type: MAC Level of consciousness: awake and alert Pain management: pain level controlled Vital Signs Assessment: post-procedure vital signs reviewed and stable Respiratory status: spontaneous breathing, nonlabored ventilation, respiratory function stable and patient connected to nasal cannula oxygen Cardiovascular status: stable and blood pressure returned to baseline Postop Assessment: no apparent nausea or vomiting Anesthetic complications: no    Alisa Graff

## 2018-09-25 ENCOUNTER — Encounter: Payer: Self-pay | Admitting: Ophthalmology

## 2018-09-26 ENCOUNTER — Other Ambulatory Visit: Payer: Self-pay | Admitting: Family

## 2018-09-26 DIAGNOSIS — Z1231 Encounter for screening mammogram for malignant neoplasm of breast: Secondary | ICD-10-CM

## 2018-10-03 DIAGNOSIS — H2511 Age-related nuclear cataract, right eye: Secondary | ICD-10-CM | POA: Diagnosis not present

## 2018-10-04 ENCOUNTER — Other Ambulatory Visit: Payer: Self-pay

## 2018-10-04 ENCOUNTER — Encounter: Payer: Self-pay | Admitting: *Deleted

## 2018-10-04 NOTE — Discharge Instructions (Signed)

## 2018-10-05 ENCOUNTER — Other Ambulatory Visit: Payer: Self-pay | Admitting: Family

## 2018-10-12 ENCOUNTER — Encounter: Payer: Self-pay | Admitting: Family

## 2018-10-12 NOTE — Anesthesia Preprocedure Evaluation (Addendum)
Anesthesia Evaluation  Patient identified by MRN, date of birth, ID band Patient awake    Reviewed: Allergy & Precautions, NPO status , Patient's Chart, lab work & pertinent test results  History of Anesthesia Complications (+) PONV and history of anesthetic complications  Airway Mallampati: II   Neck ROM: Full    Dental no notable dental hx.    Pulmonary neg pulmonary ROS,    Pulmonary exam normal breath sounds clear to auscultation       Cardiovascular hypertension, Normal cardiovascular exam Rhythm:Regular Rate:Normal     Neuro/Psych negative neurological ROS     GI/Hepatic GERD  ,IBS   Endo/Other  negative endocrine ROS  Renal/GU negative Renal ROS     Musculoskeletal  (+) Arthritis , Osteoarthritis,    Abdominal   Peds  Hematology Colon and endometrial cancers   Anesthesia Other Findings   Reproductive/Obstetrics                            Anesthesia Physical Anesthesia Plan  ASA: II  Anesthesia Plan: MAC   Post-op Pain Management:    Induction: Intravenous  PONV Risk Score and Plan: 2 and Midazolam and TIVA  Airway Management Planned: Natural Airway  Additional Equipment:   Intra-op Plan:   Post-operative Plan:   Informed Consent: I have reviewed the patients History and Physical, chart, labs and discussed the procedure including the risks, benefits and alternatives for the proposed anesthesia with the patient or authorized representative who has indicated his/her understanding and acceptance.     Plan Discussed with: CRNA  Anesthesia Plan Comments:        Anesthesia Quick Evaluation

## 2018-10-15 ENCOUNTER — Ambulatory Visit
Admission: RE | Admit: 2018-10-15 | Discharge: 2018-10-15 | Disposition: A | Discharge status: Home / Self Care | Payer: Medicare HMO | Source: Ambulatory Visit | Attending: Ophthalmology | Admitting: Ophthalmology

## 2018-10-15 ENCOUNTER — Ambulatory Visit: Payer: Medicare HMO | Admitting: Anesthesiology

## 2018-10-15 ENCOUNTER — Encounter
Admission: RE | Disposition: A | Discharge status: Home / Self Care | Payer: Self-pay | Source: Ambulatory Visit | Attending: Ophthalmology

## 2018-10-15 DIAGNOSIS — I10 Essential (primary) hypertension: Secondary | ICD-10-CM | POA: Insufficient documentation

## 2018-10-15 DIAGNOSIS — M1990 Unspecified osteoarthritis, unspecified site: Secondary | ICD-10-CM | POA: Insufficient documentation

## 2018-10-15 DIAGNOSIS — H25811 Combined forms of age-related cataract, right eye: Secondary | ICD-10-CM | POA: Diagnosis not present

## 2018-10-15 DIAGNOSIS — H2511 Age-related nuclear cataract, right eye: Secondary | ICD-10-CM | POA: Diagnosis not present

## 2018-10-15 DIAGNOSIS — E7800 Pure hypercholesterolemia, unspecified: Secondary | ICD-10-CM | POA: Insufficient documentation

## 2018-10-15 DIAGNOSIS — K589 Irritable bowel syndrome without diarrhea: Secondary | ICD-10-CM | POA: Diagnosis not present

## 2018-10-15 DIAGNOSIS — K219 Gastro-esophageal reflux disease without esophagitis: Secondary | ICD-10-CM | POA: Insufficient documentation

## 2018-10-15 DIAGNOSIS — G250 Essential tremor: Secondary | ICD-10-CM | POA: Diagnosis not present

## 2018-10-15 HISTORY — PX: CATARACT EXTRACTION W/PHACO: SHX586

## 2018-10-15 SURGERY — PHACOEMULSIFICATION, CATARACT, WITH IOL INSERTION
Anesthesia: Monitor Anesthesia Care | Site: Eye | Laterality: Right

## 2018-10-15 MED ORDER — MIDAZOLAM HCL 2 MG/2ML IJ SOLN
INTRAMUSCULAR | Status: DC | PRN
  Administered 2018-10-15: 2 mg via INTRAVENOUS

## 2018-10-15 MED ORDER — LIDOCAINE HCL (PF) 2 % IJ SOLN
INTRAOCULAR | Status: DC | PRN
  Administered 2018-10-15: 1 mL via INTRAOCULAR

## 2018-10-15 MED ORDER — LACTATED RINGERS IV SOLN: INTRAVENOUS | Status: DC

## 2018-10-15 MED ORDER — ARMC OPHTHALMIC DILATING DROPS
1.0000 "application " | OPHTHALMIC | Status: DC | PRN
  Administered 2018-10-15 (×3): 1 via OPHTHALMIC

## 2018-10-15 MED ORDER — FENTANYL CITRATE (PF) 100 MCG/2ML IJ SOLN
INTRAMUSCULAR | Status: DC | PRN
  Administered 2018-10-15 (×2): 50 ug via INTRAVENOUS

## 2018-10-15 MED ORDER — ONDANSETRON HCL 4 MG/2ML IJ SOLN
INTRAMUSCULAR | Status: DC | PRN
  Administered 2018-10-15: 4 mg via INTRAVENOUS

## 2018-10-15 MED ORDER — TETRACAINE HCL 0.5 % OP SOLN
1.0000 [drp] | OPHTHALMIC | Status: DC | PRN
  Administered 2018-10-15 (×2): 1 [drp] via OPHTHALMIC

## 2018-10-15 MED ORDER — SODIUM HYALURONATE 10 MG/ML IO SOLN
INTRAOCULAR | Status: DC | PRN
  Administered 2018-10-15: 0.55 mL via INTRAOCULAR

## 2018-10-15 MED ORDER — MOXIFLOXACIN HCL 0.5 % OP SOLN
OPHTHALMIC | Status: DC | PRN
  Administered 2018-10-15: 0.2 mL via OPHTHALMIC

## 2018-10-15 MED ORDER — EPINEPHRINE PF 1 MG/ML IJ SOLN
INTRAOCULAR | Status: DC | PRN
  Administered 2018-10-15: 76 mL via OPHTHALMIC

## 2018-10-15 MED ORDER — SODIUM HYALURONATE 23 MG/ML IO SOLN
INTRAOCULAR | Status: DC | PRN
  Administered 2018-10-15: 0.6 mL via INTRAOCULAR

## 2018-10-15 SURGICAL SUPPLY — 19 items
CANNULA ANT/CHMB 27G (MISCELLANEOUS) ×2 IMPLANT
CANNULA ANT/CHMB 27GA (MISCELLANEOUS) ×4 IMPLANT
DISSECTOR HYDRO NUCLEUS 50X22 (MISCELLANEOUS) ×2 IMPLANT
GLOVE SURG LX 7.5 STRW (GLOVE) ×1
GLOVE SURG LX STRL 7.5 STRW (GLOVE) ×1 IMPLANT
GLOVE SURG SYN 8.5  E (GLOVE) ×1
GLOVE SURG SYN 8.5 E (GLOVE) ×1 IMPLANT
GLOVE SURG SYN 8.5 PF PI (GLOVE) ×1 IMPLANT
GOWN STRL REUS W/ TWL LRG LVL3 (GOWN DISPOSABLE) ×2 IMPLANT
GOWN STRL REUS W/TWL LRG LVL3 (GOWN DISPOSABLE) ×2
LENS IOL TECNIS ITEC 18.0 (Intraocular Lens) ×1 IMPLANT
MARKER SKIN DUAL TIP RULER LAB (MISCELLANEOUS) ×2 IMPLANT
PACK DR. KING ARMS (PACKS) ×2 IMPLANT
PACK EYE AFTER SURG (MISCELLANEOUS) ×2 IMPLANT
PACK OPTHALMIC (MISCELLANEOUS) ×2 IMPLANT
SYR 3ML LL SCALE MARK (SYRINGE) ×2 IMPLANT
SYR TB 1ML LUER SLIP (SYRINGE) ×2 IMPLANT
WATER STERILE IRR 500ML POUR (IV SOLUTION) ×2 IMPLANT
WIPE NON LINTING 3.25X3.25 (MISCELLANEOUS) ×2 IMPLANT

## 2018-10-15 NOTE — Anesthesia Postprocedure Evaluation (Signed)
Anesthesia Post Note  Patient: [name]  Procedure(s) Performed: CATARACT EXTRACTION PHACO AND INTRAOCULAR LENS PLACEMENT (IOC) RIGHT (Right Eye)  Patient location during evaluation: PACU Anesthesia Type: MAC Level of consciousness: awake and alert, oriented and patient cooperative Pain management: pain level controlled Vital Signs Assessment: post-procedure vital signs reviewed and stable Respiratory status: spontaneous breathing, nonlabored ventilation and respiratory function stable Cardiovascular status: blood pressure returned to baseline and stable Postop Assessment: adequate PO intake Anesthetic complications: no    Darrin Nipper

## 2018-10-15 NOTE — Anesthesia Procedure Notes (Signed)
Procedure Name: MAC Date/Time: 10/15/2018 9:26 AM Performed by: Jeannene Patella, CRNA Pre-anesthesia Checklist: Patient identified, Emergency Drugs available, Suction available, Patient being monitored and Timeout performed Patient Re-evaluated:Patient Re-evaluated prior to induction Oxygen Delivery Method: Nasal cannula

## 2018-10-15 NOTE — Op Note (Signed)
OPERATIVE NOTE  [name] [id number] 10/15/2018   PREOPERATIVE DIAGNOSIS:  Nuclear sclerotic cataract right eye.  H25.11   POSTOPERATIVE DIAGNOSIS:    Nuclear sclerotic cataract right eye.     PROCEDURE:  Phacoemusification with posterior chamber intraocular lens placement of the right eye   LENS:   Implant Name Type Inv. Item Serial No. Manufacturer Lot No. LRB No. Used  LENS IOL DIOP 18.0 - [id number] Intraocular Lens LENS IOL DIOP 18.0 [id number] AMO  Right 1       PCB00 +18.0   ULTRASOUND TIME: 0 minutes 56.9 seconds.  CDE 6.49   SURGEON:  Benay Pillow, MD, MPH  ANESTHESIOLOGIST: Anesthesiologist: Darrin Nipper, MD CRNA: Jeannene Patella, CRNA   ANESTHESIA:  Topical with tetracaine drops augmented with 1% preservative-free intracameral lidocaine.  ESTIMATED BLOOD LOSS: less than 1 mL.   COMPLICATIONS:  None.   DESCRIPTION OF PROCEDURE:  The patient was identified in the holding room and transported to the operating room and placed in the supine position under the operating microscope.  The right eye was identified as the operative eye and it was prepped and draped in the usual sterile ophthalmic fashion.   A 1.0 millimeter clear-corneal paracentesis was made at the 10:30 position. 0.5 ml of preservative-free 1% lidocaine with epinephrine was injected into the anterior chamber.  The anterior chamber was filled with Healon 5 viscoelastic.  A 2.4 millimeter keratome was used to make a near-clear corneal incision at the 8:00 position.  A curvilinear capsulorrhexis was made with a cystotome and capsulorrhexis forceps.  Balanced salt solution was used to hydrodissect and hydrodelineate the nucleus.   Phacoemulsification was then used in stop and chop fashion to remove the lens nucleus and epinucleus.  The remaining cortex was then removed using the irrigation and aspiration handpiece. Healon was then placed into the capsular bag to distend it for lens placement.  A lens  was then injected into the capsular bag.  The remaining viscoelastic was aspirated.   Wounds were hydrated with balanced salt solution.  The anterior chamber was inflated to a physiologic pressure with balanced salt solution.   Intracameral vigamox 0.1 mL undiluted was injected into the eye and a drop placed onto the ocular surface.  No wound leaks were noted.  The patient was taken to the recovery room in stable condition without complications of anesthesia or surgery  Benay Pillow 10/15/2018, 9:42 AM

## 2018-10-15 NOTE — Transfer of Care (Signed)
Immediate Anesthesia Transfer of Care Note  Patient: [name]  Procedure(s) Performed: CATARACT EXTRACTION PHACO AND INTRAOCULAR LENS PLACEMENT (IOC) RIGHT (Right Eye)  Patient Location: PACU  Anesthesia Type: MAC  Level of Consciousness: awake, alert  and patient cooperative  Airway and Oxygen Therapy: Patient Spontanous Breathing and Patient connected to supplemental oxygen  Post-op Assessment: Post-op Vital signs reviewed, Patient's Cardiovascular Status Stable, Respiratory Function Stable, Patent Airway and No signs of Nausea or vomiting  Post-op Vital Signs: Reviewed and stable  Complications: No apparent anesthesia complications

## 2018-10-15 NOTE — H&P (Signed)
The History and Physical notes are on paper, have been signed, and are to be scanned.   I have examined the patient and there are no changes to the H&P.   [name] 10/15/2018 9:01 AM

## 2018-10-16 ENCOUNTER — Encounter: Payer: Self-pay | Admitting: Ophthalmology

## 2018-10-25 ENCOUNTER — Inpatient Hospital Stay: Payer: Medicare HMO | Attending: Oncology

## 2018-10-25 ENCOUNTER — Encounter: Payer: Self-pay | Admitting: Radiation Oncology

## 2018-10-25 ENCOUNTER — Inpatient Hospital Stay (HOSPITAL_BASED_OUTPATIENT_CLINIC_OR_DEPARTMENT_OTHER): Payer: Medicare HMO | Admitting: Oncology

## 2018-10-25 ENCOUNTER — Other Ambulatory Visit: Payer: Self-pay

## 2018-10-25 ENCOUNTER — Ambulatory Visit
Admission: RE | Admit: 2018-10-25 | Discharge: 2018-10-25 | Disposition: A | Discharge status: Home / Self Care | Payer: Medicare HMO | Source: Ambulatory Visit | Attending: Radiation Oncology | Admitting: Radiation Oncology

## 2018-10-25 ENCOUNTER — Encounter: Payer: Self-pay | Admitting: Oncology

## 2018-10-25 VITALS — BP 126/74 | HR 90 | Temp 96.4°F | Resp 16 | Wt 215.4 lb

## 2018-10-25 VITALS — BP 125/81 | HR 97 | Temp 96.8°F | Resp 18 | Wt 215.4 lb

## 2018-10-25 DIAGNOSIS — Z8542 Personal history of malignant neoplasm of other parts of uterus: Secondary | ICD-10-CM | POA: Diagnosis not present

## 2018-10-25 DIAGNOSIS — Z95828 Presence of other vascular implants and grafts: Secondary | ICD-10-CM

## 2018-10-25 DIAGNOSIS — Z923 Personal history of irradiation: Secondary | ICD-10-CM | POA: Diagnosis not present

## 2018-10-25 DIAGNOSIS — C187 Malignant neoplasm of sigmoid colon: Secondary | ICD-10-CM | POA: Insufficient documentation

## 2018-10-25 DIAGNOSIS — Z85048 Personal history of other malignant neoplasm of rectum, rectosigmoid junction, and anus: Secondary | ICD-10-CM | POA: Insufficient documentation

## 2018-10-25 DIAGNOSIS — C186 Malignant neoplasm of descending colon: Secondary | ICD-10-CM

## 2018-10-25 DIAGNOSIS — Z452 Encounter for adjustment and management of vascular access device: Secondary | ICD-10-CM | POA: Insufficient documentation

## 2018-10-25 LAB — COMPREHENSIVE METABOLIC PANEL
ALBUMIN: 3.8 g/dL (ref 3.5–5.0)
ALT: 18 U/L (ref 0–44)
AST: 25 U/L (ref 15–41)
Alkaline Phosphatase: 102 U/L (ref 38–126)
Anion gap: 6 (ref 5–15)
BUN: 20 mg/dL (ref 8–23)
CO2: 23 mmol/L (ref 22–32)
Calcium: 9.1 mg/dL (ref 8.9–10.3)
Chloride: 110 mmol/L (ref 98–111)
Creatinine, Ser: 0.96 mg/dL (ref 0.44–1.00)
GFR calc Af Amer: >60 mL/min (ref >60)
GFR, EST NON AFRICAN AMERICAN: 57 mL/min — AB (ref >60)
Glucose, Bld: 133 mg/dL — ABNORMAL HIGH (ref 70–99)
Potassium: 4 mmol/L (ref 3.5–5.1)
Sodium: 139 mmol/L (ref 135–145)
Total Bilirubin: 0.9 mg/dL (ref 0.3–1.2)
Total Protein: 6.8 g/dL (ref 6.5–8.1)

## 2018-10-25 LAB — CBC WITH DIFFERENTIAL/PLATELET
ABS IMMATURE GRANULOCYTES: 0.02 10*3/uL (ref 0.00–0.07)
Basophils Absolute: 0 10*3/uL (ref 0.0–0.1)
Basophils Relative: 1 %
Eosinophils Absolute: 0.2 10*3/uL (ref 0.0–0.5)
Eosinophils Relative: 4 %
HCT: 38.6 % (ref 36.0–46.0)
Hemoglobin: 12.4 g/dL (ref 12.0–15.0)
Immature Granulocytes: 1 %
LYMPHS PCT: 26 %
Lymphs Abs: 1.1 10*3/uL (ref 0.7–4.0)
MCH: 28.6 pg (ref 26.0–34.0)
MCHC: 32.1 g/dL (ref 30.0–36.0)
MCV: 89.1 fL (ref 80.0–100.0)
MONO ABS: 0.4 10*3/uL (ref 0.1–1.0)
Monocytes Relative: 8 %
Neutro Abs: 2.7 10*3/uL (ref 1.7–7.7)
Neutrophils Relative %: 60 %
Platelets: 202 10*3/uL (ref 150–400)
RBC: 4.33 MIL/uL (ref 3.87–5.11)
RDW: 14.3 % (ref 11.5–15.5)
WBC: 4.4 10*3/uL (ref 4.0–10.5)
nRBC: 0 % (ref 0.0–0.2)

## 2018-10-25 MED ORDER — SODIUM CHLORIDE 0.9% FLUSH
10.0000 mL | Freq: Once | INTRAVENOUS | Status: AC
  Administered 2018-10-25: 10 mL via INTRAVENOUS
  Filled 2018-10-25: qty 10

## 2018-10-25 MED ORDER — HEPARIN SOD (PORK) LOCK FLUSH 100 UNIT/ML IV SOLN
500.0000 [IU] | Freq: Once | INTRAVENOUS | Status: AC
  Administered 2018-10-25: 500 [IU] via INTRAVENOUS
  Filled 2018-10-25: qty 5

## 2018-10-25 NOTE — Progress Notes (Signed)
Radiation Oncology Follow up Note  Name: [name]   Date:   10/25/2018 MRN:  [id number] DOB: [birth year]    This 77 y.o. female presents to the clinic today for six-month follow-up status post adjuvant radiation therapy for stage IIIB locally advanced rectal cancer status post robotic cold colectomy in patient with history of endometrial carcinoma receiving vaginal brachytherapy.  REFERRING PROVIDER: Burnard Hawthorne, FNP  HPI: patient is a 77 year old female now out 6 months having completed external beam radiation therapy for locally advanced stage IIIB rectal cancer status post robotic colectomy in a patient with a prior history of endometrial carcinoma receiving brachytherapy seen today in routine follow-up she is doing well she specifically denies diarrhea dysuria or any other GI/GU complaints. She does not follow any low-residue diet does have slight intermittent diarrhea on occasion. She is having no rectal bleeding..she has CT 6 scan back in September which I have reviewed shows no evidence of recurrent or metastatic disease.  COMPLICATIONS OF TREATMENT: none  FOLLOW UP COMPLIANCE: keeps appointments   PHYSICAL EXAM:  BP 126/74 (BP Location: Left Arm, Patient Position: Sitting)   Pulse 90   Temp (!) 96.4 F (35.8 C) (Tympanic)   Resp 16   Wt 215 lb 6.2 oz (97.7 kg)   BMI 35.84 kg/m  Well-developed well-nourished patient in NAD. HEENT reveals PERLA, EOMI, discs not visualized.  Oral cavity is clear. No oral mucosal lesions are identified. Neck is clear without evidence of cervical or supraclavicular adenopathy. Lungs are clear to A&P. Cardiac examination is essentially unremarkable with regular rate and rhythm without murmur rub or thrill. Abdomen is benign with no organomegaly or masses noted. Motor sensory and DTR levels are equal and symmetric in the upper and lower extremities. Cranial nerves II through XII are grossly intact. Proprioception is intact. No peripheral  adenopathy or edema is identified. No motor or sensory levels are noted. Crude visual fields are within normal range.  RADIOLOGY RESULTS: CT scan reviewed and compatible with the above-stated findings  PLAN: present time patient is doing well with no evidence of disease. I'm please were overall progress. I've asked to see her back in 1 year for follow-up. She continues close follow-up care with medical oncology. Patient is to call with any concerns.  I would like to take this opportunity to thank you for allowing me to participate in the care of your patient.Noreene Filbert, MD

## 2018-10-25 NOTE — Progress Notes (Signed)
Hematology/Oncology Follow Up visit. [place] Telephone:([phone]) [phone] [phone]) [phone]  CONSULT NOTE Patient Care Team: Burnard Hawthorne, FNP as PCP - General (Family Medicine) Vladimir Crofts, MD (Neurology) Noreene Filbert, MD as Referring Physician (Radiation Oncology) Leighton Ruff, MD as Consulting Physician (General Surgery) Earlie Server, MD as Consulting Physician (Oncology) Jules Husbands, MD as Consulting Physician (General Surgery)  PURPOSE OF CONSULTATION:  Follow up for treatment of Colon cancer  HISTORY OF PRESENTING ILLNESS:  [name] 77 y.o.  female with PMH listed as below was referred to me for evaluation and management of colon cancer.  She had colonoscopy evaluation due to positive cologuard.  She was found to have a nonobstructing mass approximately 20 cm from the anal verge which biopsy proved to be adenocarcinoma. CT scan showed a large mass from the sigmoid and extends beyond the wall of the colon toward the left into the surrounding fat. Preoperational CEA was normal. Patient underwent robotic sigmoidectomy on 06/07/2017.   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 MLH1  loss of function, PMS2 loss of function. Declined option of clinical trial ATOMIC She has a history of endometrial cancer s/p hysterotomy and radiation. She has a family history of colon cancer and breast cancer.  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 Results have been explained to patient by genetic counselor.   Oncology treatment S/p adjuvant chemotherapy finished February 2019 Adjuvant RT finished in April 2019.   #05/22/2018 s/p colonoscopy during the interval with findings of benign polyp, resected.  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   INTERVAL HISTORY 77 yo female with above  history presents for follow up of Stage IIIB colon cancer.    Reports feeling well today. She has chronic diarrhea, intermittent which patient feels manageable, not bothering her.  Attributes to the radiation. Denies any fatigue, shortness of breath, cough, new bone pain, or abdominal pain.  Review of Systems  Constitutional: Negative for appetite change, chills, fatigue and fever.  HENT:   Negative for hearing loss and voice change.   Eyes: Negative for eye problems.  Respiratory: Negative for chest tightness and cough.   Cardiovascular: Negative for chest pain.  Gastrointestinal: Positive for diarrhea. Negative for abdominal distention, abdominal pain and blood in stool.  Endocrine: Negative for hot flashes.  Genitourinary: Negative for difficulty urinating and frequency.   Musculoskeletal: Negative for arthralgias.  Skin: Negative for itching and rash.  Neurological: Negative for extremity weakness.  Hematological: Negative for adenopathy.  Psychiatric/Behavioral: Negative for confusion.   MEDICAL HISTORY:  Past Medical History:  Diagnosis Date  . Arthritis    bitateral knees and left foot,s/p cortisone injection in past  . Chronic constipation   . Colon cancer ([place]) 2018   pt is undergoing chemo 11-09-17  . Complication of anesthesia     slow to wake up   . Endometrial cancer ([place]) 2014   Total Hysterectomy and Rad tx's.   . Family history of cancer   . Genetic testing 09/26/2017    Multi-Cancer panel (83 genes) @ [facility] - No pathogenic mutations detected  . GERD (gastroesophageal reflux disease)   . Hypertension   . IBS (irritable bowel syndrome)   . Macular degeneration of both eyes   . Menopause    age 66  . PONV (postoperative nausea and  vomiting)   . Vitamin D deficiency 06/07/10    SURGICAL HISTORY: Past Surgical History:  Procedure Laterality Date  . BUNIONECTOMY    . CATARACT EXTRACTION W/PHACO Left 09/24/2018   Procedure: CATARACT EXTRACTION PHACO AND  INTRAOCULAR LENS PLACEMENT ([place]) LEFT;  Surgeon: Eulogio Bear, MD;  Location: [place];  Service: Ophthalmology;  Laterality: Left;  . CATARACT EXTRACTION W/PHACO Right 10/15/2018   Procedure: CATARACT EXTRACTION PHACO AND INTRAOCULAR LENS PLACEMENT ([place]) RIGHT;  Surgeon: Eulogio Bear, MD;  Location: [place];  Service: Ophthalmology;  Laterality: Right;  . COLONOSCOPY WITH PROPOFOL N/A 04/04/2017   Procedure: COLONOSCOPY WITH PROPOFOL;  Surgeon: [name], MD;  Location[facility] END[facility]COPY;  Service: Endoscopy;  Laterality: N/A;  . COLONOSCOPY WITH PROPOFOL N/A 05/22/2018   Procedure: COLONOSCOPY WITH PROPOFOL;  Surgeon: [name], MD;  Location: [facility] ENDOSCOPY;  Service: Endoscopy;  Laterality: N/A;  . DILATION AND CURETTAGE OF UTERUS  1971  . FOOT SURGERY    . NASAL SINUS SURGERY    . PORTACATH PLACEMENT Right 07/12/2017   Procedure: INSERTION PORT-A-CATH;  Surgeon: Jules Husbands, MD;  Location: [facility] ORS;  Service: General;  Laterality: Right;  . ROBOTIC ASSISTED LAPAROSCOPIC VENTRAL/INCISIONAL HERNIA REPAIR N/A 06/07/2017   Procedure: ROBOTIC INCISIONAL HERNIA REPAIR;  Surgeon: Leighton Ruff, MD;  Location: [facility];  Service: General;  Laterality: N/A;  . VAGINAL DELIVERY    . VAGINAL HYSTERECTOMY      SOCIAL HISTORY: Social History   Socioeconomic History  . Marital status: Divorced    Spouse name: Not on file  . Number of children: Not on file  . Years of education: Not on file  . Highest education level: Not on file  Occupational History  . Not on file  Social Needs  . Financial resource strain: Not on file  . Food insecurity:    Worry: Not on file    Inability: Not on file  . Transportation needs:    Medical: Not on file    Non-medical: Not on file  Tobacco Use  . Smoking status: Never Smoker  . Smokeless tobacco: Never Used  Substance and Sexual Activity  . Alcohol use: Yes    Comment: rarely - Holidays  . Drug use: No  . Sexual  activity: Never  Lifestyle  . Physical activity:    Days per week: Not on file    Minutes per session: Not on file  . Stress: Not on file  Relationships  . Social connections:    Talks on phone: Not on file    Gets together: Not on file    Attends religious service: Not on file    Active member of club or organization: Not on file    Attends meetings of clubs or organizations: Not on file    Relationship status: Not on file  . Intimate partner violence:    Fear of current or ex partner: Not on file    Emotionally abused: Not on file    Physically abused: Not on file    Forced sexual activity: Not on file  Other Topics Concern  . Not on file  Social History Narrative   Exercise- aerobic exercise at home 3-4x week.       Lives in [place]. No pets.    FAMILY HISTORY: Family History  Problem Relation Age of Onset  . Heart disease Father 45       massive MI  . Diabetes Father   . Bipolar disorder Sister   .  Osteoporosis Sister   . Breast cancer Sister 58       currently 28  . Cancer Sister        breast  . Bipolar disorder Maternal Aunt   . Breast cancer Maternal Aunt        age at dx unknown; deceased 1s  . Colon cancer Maternal Grandfather 56       deceased 70  . Cancer Maternal Grandfather        breast  . Osteoporosis Mother   . Colon cancer Mother        age at dx unknown; deceased 59  . Cancer Maternal Grandmother        unk. type; deceased 1  . Breast cancer Paternal Aunt        deceased 54    ALLERGIES:  is allergic to codeine.  MEDICATIONS:  Current Outpatient Medications  Medication Sig Dispense Refill  . chlorhexidine (PERIDEX) 0.12 % solution Use as directed 15 mLs in the mouth or throat 2 (two) times daily. (Patient not taking: Reported on 09/17/2018) 473 mL 0  . Docusate Calcium (STOOL SOFTENER PO) Take by mouth daily as needed.    . fluticasone (FLONASE) 50 MCG/ACT nasal spray Place 2 sprays into both nostrils daily. 16 g 2  .  lidocaine-prilocaine (EMLA) cream Apply 1 application topically as needed. (Patient not taking: Reported on 09/17/2018) 30 g 0  . losartan (COZAAR) 50 MG tablet TAKE 1 TABLET EVERY DAY 90 tablet 3  . Magnesium 400 MG TABS Take 400 mg by mouth daily as needed (irritable bowel syndrome).    . metoprolol succinate (TOPROL-XL) 100 MG 24 hr tablet TAKE 1 TABLET DAILY 90 tablet 3  . Multiple Vitamin (MULTIVITAMIN WITH MINERALS) TABS tablet Take 1 tablet by mouth daily.    . Multiple Vitamins-Minerals (ICAPS AREDS 2 PO) Take 2 capsules by mouth daily.    . ondansetron (ZOFRAN) 8 MG tablet Take 1 tablet (8 mg total) by mouth 2 (two) times daily as needed for refractory nausea / vomiting. Start on day 3 after chemotherapy. (Patient not taking: Reported on 09/17/2018) 30 tablet 1  . phenylephrine (SUDAFED PE) 10 MG TABS tablet Take 10 mg by mouth every 4 (four) hours as needed.    . Polyethylene Glycol 3350 (MIRALAX PO) Take 17 g by mouth daily as needed (constipation).     . prochlorperazine (COMPAZINE) 10 MG tablet Take 1 tablet (10 mg total) by mouth every 6 (six) hours as needed (Nausea or vomiting). (Patient not taking: Reported on 09/17/2018) 30 tablet 1  . simvastatin (ZOCOR) 20 MG tablet TAKE 1 TABLET EVERY EVENING 90 tablet 4  . topiramate (TOPAMAX) 50 MG tablet TAKE 1 TABLET TWICE DAILY 180 tablet 0   No current facility-administered medications for this visit.    Facility-Administered Medications Ordered in Other Visits  Medication Dose Route Frequency Provider Last Rate Last Dose  . sodium chloride flush (NS) 0.9 % injection 10 mL  10 mL Intracatheter PRN Earlie Server, MD   10 mL at 11/08/17 1400      .  PHYSICAL EXAMINATION: ECOG PERFORMANCE STATUS: 0 - Asymptomatic Vitals:   10/25/18 1357 10/25/18 1358  BP:  125/81  Pulse:  97  Resp:  18  Temp: (!) 96.8 F (36 C)    Filed Weights   10/25/18 1357  Weight: 215 lb 6.4 oz (97.7 kg)   Physical Exam Constitutional:      General: She is  not in acute distress.  Appearance: She is well-developed.  HENT:     Head: Normocephalic and atraumatic.     Mouth/Throat:     Pharynx: No oropharyngeal exudate.  Eyes:     General: No scleral icterus.    Conjunctiva/sclera: Conjunctivae normal.     Pupils: Pupils are equal, round, and reactive to light.  Neck:     Musculoskeletal: Normal range of motion and neck supple.  Cardiovascular:     Rate and Rhythm: Normal rate and regular rhythm.     Heart sounds: Normal heart sounds. No murmur.  Pulmonary:     Effort: Pulmonary effort is normal. No respiratory distress.     Breath sounds: Normal breath sounds. No stridor. No wheezing or rales.  Chest:     Chest wall: No tenderness.  Abdominal:     General: Bowel sounds are normal. There is no distension.     Palpations: Abdomen is soft. There is no mass.     Tenderness: There is no abdominal tenderness. There is no guarding.  Musculoskeletal: Normal range of motion.        General: No deformity.  Lymphadenopathy:     Cervical: No cervical adenopathy.  Skin:    General: Skin is warm and dry.     Findings: No erythema or rash.     Comments: Chest wall port.   Neurological:     Mental Status: She is alert and oriented to person, place, and time.     Cranial Nerves: No cranial nerve deficit.     Coordination: Coordination normal.  Psychiatric:        Behavior: Behavior normal.        Thought Content: Thought content normal.     LABORATORY DATA:  I have reviewed the data as listed Lab Results  Component Value Date   WBC 4.4 10/25/2018   HGB 12.4 10/25/2018   HCT 38.6 10/25/2018   MCV 89.1 10/25/2018   PLT 202 10/25/2018   Recent Labs    04/30/18 1436 07/31/18 1109 08/02/18 1436 10/25/18 1259  NA 138  --  141 139  K 4.1  --  4.0 4.0  CL 113*  --  111 110  CO2 21*  --  24 23  GLUCOSE 93  --  88 133*  BUN 18  --  14 20  CREATININE 0.86 0.90 0.78 0.96  CALCIUM 9.2  --  8.8* 9.1  GFRNONAA >60  --  >60 57*  GFRAA  >60  --  >60 >60  PROT 6.6  --  6.6 6.8  ALBUMIN 3.7  --  3.7 3.8  AST 25  --  24 25  ALT 22  --  19 18  ALKPHOS 97  --  90 102  BILITOT 0.7  --  0.8 0.9    RADIOGRAPHIC STUDIES: I have personally reviewed the radiological images as listed and agreed with the findings in the report. 04/12/2017 CT abdomen pelvis w contrast Large mass arising from the sigmoid colon. This mass extends beyond the wall of the colon toward the left into the surrounding fat. This lesion does not reach the left pelvic sidewall.No adenopathy or liver lesions evident. No omental lesions appreciable. Sizable midline ventral hernia slightly superior to theumbilicus containing fat and a loop of colon without bowel compromise. Much smaller midline umbilical ventral hernia containingonly fat. There is a small hiatal hernia. No bowel obstruction.  No abscess.  Appendix region appears normal. No renal or ureteral calculus.  No hydronephrosis.There is aortoiliac atherosclerosis. There  are foci of coronary artery calcification.  RADIOGRAPHIC STUDIES: I have personally reviewed the radiological images as listed and agreed with the findings in the report. 07/31/2018  No evidence of recurrent or metastatic carcinoma, or other significant abnormality.  Aortic and coronary artery atherosclerosis incidentally noted   ASSESSMENT & PLAN:  77 y.o.female who has Stage IIIB colon cancer.  Cancer Staging Cancer of descending colon [facility]) Staging form: Colon and Rectum, AJCC 8th Edition - Pathologic: Stage IIIB (pT4a, pN1, cM0) - Signed by Earlie Server, MD on 04/30/2018  1. Port-A-Cath in place   2. Malignant neoplasm of sigmoid colon ([place])    #Labs reviewed and discussed with patient.  CEA is pending. Clinically she is doing well.  No new complaints. Discussed with patient that she will follow-up with me every 3 months for, labs history and physical examination. CT image surveillance every 6 months for the first 2 years and then  annually for another 3 years. Schedule CT chest abdomen pelvis in 3 months. Continue port A cath.  She desires keep a Mediport for 2 years.  Continue flushing every 6-8 weeks.    Return of visit: 3 months.  After CT scan  Total face to face encounter time for this patient visit was 15 min. >50% of the time was  spent in counseling and coordination of care.   Earlie Server, MD, PhD Hematology Oncology [facility] at [facility] Pager- [phone] 10/25/2018

## 2018-10-25 NOTE — Progress Notes (Signed)
Patient here for follow up. No concerns voiced.  °

## 2018-10-26 LAB — CEA: CEA: 1.6 ng/mL (ref 0.0–4.7)

## 2018-11-01 DIAGNOSIS — G250 Essential tremor: Secondary | ICD-10-CM | POA: Diagnosis not present

## 2018-11-12 ENCOUNTER — Encounter: Payer: Self-pay | Admitting: Family

## 2018-11-12 ENCOUNTER — Ambulatory Visit
Admission: RE | Admit: 2018-11-12 | Discharge: 2018-11-12 | Disposition: A | Discharge status: Home / Self Care | Payer: Medicare HMO | Source: Ambulatory Visit | Attending: Family | Admitting: Family

## 2018-11-12 DIAGNOSIS — Z1231 Encounter for screening mammogram for malignant neoplasm of breast: Secondary | ICD-10-CM | POA: Insufficient documentation

## 2018-11-16 ENCOUNTER — Ambulatory Visit (INDEPENDENT_AMBULATORY_CARE_PROVIDER_SITE_OTHER): Payer: PPO

## 2018-11-16 VITALS — BP 124/70 | HR 50 | Temp 97.9°F | Resp 16 | Ht 65.0 in | Wt 216.4 lb

## 2018-11-16 DIAGNOSIS — Z0000 Encounter for general adult medical examination without abnormal findings: Secondary | ICD-10-CM

## 2018-11-16 NOTE — Progress Notes (Signed)
Subjective:   [name] is a 78 y.o. female who presents for Medicare Annual (Subsequent) preventive examination.  Review of Systems:  No ROS.  Medicare Wellness Visit. Additional risk factors are reflected in the social history. Cardiac Risk Factors include: advanced age (>39men, >39 women);hypertension     Objective:     Vitals: BP 124/70 (BP Location: Left Arm, Patient Position: Sitting, Cuff Size: Normal)   Pulse (!) 50   Temp 97.9 F (36.6 C) (Oral)   Resp 16   Ht 5\' 5"  (1.651 m)   Wt 216 lb 6.4 oz (98.2 kg)   SpO2 97%   BMI 36.01 kg/m   Body mass index is 36.01 kg/m.  Advanced Directives 11/16/2018 10/25/2018 10/25/2018 10/15/2018 09/24/2018 08/02/2018 05/22/2018  Does Patient Have a Medical Advance Directive? Yes No No Yes Yes Yes Yes  Type of Paramedic of [place];Living will - [place];Living will [place];Living will Living will;Healthcare Power of [place];Living will [place];Living will  Does patient want to make changes to medical advance directive? No - Patient declined - No - Patient declined No - Patient declined No - Patient declined - -  Copy of [place] in Chart? No - copy requested - No - copy requested - No - copy requested - No - copy requested  Would patient like information on creating a medical advance directive? - - No - Patient declined - - - -    Tobacco Social History   Tobacco Use  Smoking Status Never Smoker  Smokeless Tobacco Never Used     Counseling given: Not Answered   Clinical Intake:  Pre-visit preparation completed: Yes  Pain : No/denies pain     Diabetes: No  How often do you need to have someone help you when you read instructions, pamphlets, or other written materials from your doctor or pharmacy?: 1 - Never  Interpreter Needed?: No     Past Medical History:  Diagnosis Date  .  Arthritis    bitateral knees and left foot,s/p cortisone injection in past  . Chronic constipation   . Colon cancer ([place]) 2018   pt is undergoing chemo 11-09-17  . Complication of anesthesia     slow to wake up   . Endometrial cancer ([place]) 2014   Total Hysterectomy and Rad tx's.   . Family history of cancer   . Genetic testing 09/26/2017    Multi-Cancer panel (83 genes) @ [facility] - No pathogenic mutations detected  . GERD (gastroesophageal reflux disease)   . Hypertension   . IBS (irritable bowel syndrome)   . Macular degeneration of both eyes   . Menopause    age 7  . PONV (postoperative nausea and vomiting)   . Vitamin D deficiency 06/07/10   Past Surgical History:  Procedure Laterality Date  . BUNIONECTOMY    . CATARACT EXTRACTION W/PHACO Left 09/24/2018   Procedure: CATARACT EXTRACTION PHACO AND INTRAOCULAR LENS PLACEMENT ([place]) LEFT;  Surgeon: Eulogio Bear, MD;  Location: [facility];  Service: Ophthalmology;  Laterality: Left;  . CATARACT EXTRACTION W/PHACO Right 10/15/2018   Procedure: CATARACT EXTRACTION PHACO AND INTRAOCULAR LENS PLACEMENT ([place]) RIGHT;  Surgeon: Eulogio Bear, MD;  Location: [place];  Service: Ophthalmology;  Laterality: Right;  . cataract surgery     both eyes  . COLONOSCOPY WITH PROPOFOL N/A 04/04/2017   Procedure: COLONOSCOPY WITH PROPOFOL;  Surgeon: [name],  MD;  Location: [facility] ENDOSCOPY;  Service: Endoscopy;  Laterality: N/A;  . COLONOSCOPY WITH PROPOFOL N/A 05/22/2018   Procedure: COLONOSCOPY WITH PROPOFOL;  Surgeon: [name], MD;  Location: [facility] ENDOSCOPY;  Service: Endoscopy;  Laterality: N/A;  . DILATION AND CURETTAGE OF UTERUS  1971  . FOOT SURGERY    . NASAL SINUS SURGERY    . PORTACATH PLACEMENT Right 07/12/2017   Procedure: INSERTION PORT-A-CATH;  Surgeon: Jules Husbands, MD;  Location: [facility] ORS;  Service: General;  Laterality: Right;  . ROBOTIC ASSISTED LAPAROSCOPIC VENTRAL/INCISIONAL HERNIA REPAIR N/A  06/07/2017   Procedure: ROBOTIC INCISIONAL HERNIA REPAIR;  Surgeon: Leighton Ruff, MD;  Location: [facility] ORS;  Service: General;  Laterality: N/A;  . VAGINAL DELIVERY    . VAGINAL HYSTERECTOMY     Family History  Problem Relation Age of Onset  . Heart disease Father 110       massive MI  . Diabetes Father   . Bipolar disorder Sister   . Osteoporosis Sister   . Breast cancer Sister 48       currently 45  . Cancer Sister        breast  . Bipolar disorder Maternal Aunt   . Breast cancer Maternal Aunt        age at dx unknown; deceased 50s  . Colon cancer Maternal Grandfather 44       deceased 22  . Cancer Maternal Grandfather        breast  . Osteoporosis Mother   . Colon cancer Mother        age at dx unknown; deceased 28  . Cancer Maternal Grandmother        unk. type; deceased 68  . Breast cancer Paternal Aunt        deceased 74   Social History   Socioeconomic History  . Marital status: Divorced    Spouse name: Not on file  . Number of children: Not on file  . Years of education: Not on file  . Highest education level: Not on file  Occupational History  . Not on file  Social Needs  . Financial resource strain: Not hard at all  . Food insecurity:    Worry: Never true    Inability: Never true  . Transportation needs:    Medical: No    Non-medical: No  Tobacco Use  . Smoking status: Never Smoker  . Smokeless tobacco: Never Used  Substance and Sexual Activity  . Alcohol use: Yes    Comment: rarely - Holidays  . Drug use: No  . Sexual activity: Never  Lifestyle  . Physical activity:    Days per week: Not on file    Minutes per session: Not on file  . Stress: Not at all  Relationships  . Social connections:    Talks on phone: Not on file    Gets together: Not on file    Attends religious service: Not on file    Active member of club or organization: Not on file    Attends meetings of clubs or organizations: Not on file    Relationship status: Not on file    Other Topics Concern  . Not on file  Social History Narrative   Exercise- aerobic exercise at home 3-4x week.       Lives in [place]. No pets.    Outpatient Encounter Medications as of 11/16/2018  Medication Sig  . Docusate Calcium (STOOL SOFTENER PO) Take by mouth daily as needed.  Marland Kitchen  fluticasone (FLONASE) 50 MCG/ACT nasal spray Place 2 sprays into both nostrils daily. (Patient taking differently: Place 2 sprays into both nostrils daily as needed. )  . lidocaine-prilocaine (EMLA) cream Apply 1 application topically as needed.  Marland Kitchen losartan (COZAAR) 50 MG tablet TAKE 1 TABLET EVERY DAY  . Magnesium 400 MG TABS Take 400 mg by mouth daily as needed (irritable bowel syndrome).  . metoprolol succinate (TOPROL-XL) 100 MG 24 hr tablet TAKE 1 TABLET DAILY  . Multiple Vitamin (MULTIVITAMIN WITH MINERALS) TABS tablet Take 1 tablet by mouth daily.  . Multiple Vitamins-Minerals (ICAPS AREDS 2 PO) Take 2 capsules by mouth daily.  . ondansetron (ZOFRAN) 8 MG tablet Take 1 tablet (8 mg total) by mouth 2 (two) times daily as needed for refractory nausea / vomiting. Start on day 3 after chemotherapy.  . phenylephrine (SUDAFED PE) 10 MG TABS tablet Take 10 mg by mouth every 4 (four) hours as needed.  . Polyethylene Glycol 3350 (MIRALAX PO) Take 17 g by mouth daily as needed (constipation).   . prochlorperazine (COMPAZINE) 10 MG tablet Take 1 tablet (10 mg total) by mouth every 6 (six) hours as needed (Nausea or vomiting).  . simvastatin (ZOCOR) 20 MG tablet TAKE 1 TABLET EVERY EVENING  . topiramate (TOPAMAX) 25 MG tablet Take by mouth.  . topiramate (TOPAMAX) 50 MG tablet TAKE 1 TABLET TWICE DAILY   Facility-Administered Encounter Medications as of 11/16/2018  Medication  . sodium chloride flush (NS) 0.9 % injection 10 mL    Activities of Daily Living In your present state of health, do you have any difficulty performing the following activities: 11/16/2018 10/15/2018  Hearing? N N  Vision? N N   Difficulty concentrating or making decisions? N N  Walking or climbing stairs? N N  Dressing or bathing? N N  Doing errands, shopping? N -  Preparing Food and eating ? N -  Using the Toilet? N -  In the past six months, have you accidently leaked urine? N -  Do you have problems with loss of bowel control? N -  Managing your Medications? N -  Managing your Finances? N -  Housekeeping or managing your Housekeeping? N -  Some recent data might be hidden    Patient Care Team: Burnard Hawthorne, FNP as PCP - General (Family Medicine) Vladimir Crofts, MD (Neurology) Noreene Filbert, MD as Referring Physician (Radiation Oncology) Leighton Ruff, MD as Consulting Physician (General Surgery) Earlie Server, MD as Consulting Physician (Oncology) Jules Husbands, MD as Consulting Physician (General Surgery)    Assessment:   This is a routine wellness examination for [place].  Health Screenings  Mammogram- 11/12/18 Colonoscopy-05/22/18 Hearing-difficulty hearing L: ear. Audiology deferred per patient preference.  Hemoglobin A1C -12/18/17 (5.8) Cholesterol -02/17/17 (175)  Social  Alcohol intake -no Smoking history- former Smokers in home? none Illicit drug use? none Exercise-walking Music therapist  Patient feels safe at home.  Patient does have smoke detectors at home  Patient does wear sunscreen or protective clothing when in direct sunlight. Patient does wear seat belt when driving or riding with others.   Activities of Daily Living Patient can do their own household chores. Denies needing assistance with: driving, feeding themselves, getting from bed to chair, getting to the toilet, bathing/showering, dressing, managing money, climbing flight of stairs, or preparing meals.   Depression Screen Patient denies losing interest in daily life, feeling hopeless, or crying easily over simple problems.   Fall Screen Patient denies being  afraid of falling or falling  in the last year.   Memory Screen Patient denies problems with memory, misplacing items, and is able to balance checkbook/bank accounts.  Patient is alert, normal appearance, oriented to person/place/and time. Correctly identified the president of the [place], recall of 1/3 objects, and performing simple calculations.  Patient displays appropriate judgement and can read correct time from watch face.   Immunizations The following Immunizations are up to date: Influenza, shingles, pneumonia, and tetanus.   Other Providers Patient Care Team: Burnard Hawthorne, FNP as PCP - General (Family Medicine) Vladimir Crofts, MD (Neurology) Noreene Filbert, MD as Referring Physician (Radiation Oncology) Leighton Ruff, MD as Consulting Physician (General Surgery) Earlie Server, MD as Consulting Physician (Oncology) Jules Husbands, MD as Consulting Physician (General Surgery)  Exercise Activities and Dietary recommendations    Goals      Patient Stated   . Increase physical activity (pt-stated)     Attend silver sneaker aerobic program        Fall Risk Fall Risk  11/16/2018 04/19/2018 11/15/2017 10/09/2017 09/25/2017  Falls in the past year? 0 No No No No  Number falls in past yr: - - - - -  Injury with Fall? - - - - -  Follow up - - - - -   Depression Screen PHQ 2/9 Scores 11/16/2018 04/19/2018 11/15/2017 02/14/2017  PHQ - 2 Score 0 0 1 0  PHQ- 9 Score - - 2 -     Cognitive Function MMSE - Mini Mental State Exam 11/15/2017 04/15/2015  Orientation to time 5 5  Orientation to Place 5 5  Registration 3 3  Attention/ Calculation 5 5  Recall 2 3  Language- name 2 objects 2 2  Language- repeat 1 1  Language- follow 3 step command 3 3  Language- read & follow direction 1 1  Language-read & follow direction-comments - Patient loves to read.  Write a sentence 1 1  Copy design 1 1  Total score 29 30     6CIT Screen 11/16/2018 11/15/2016  What Year? 0 points 0 points  What month? 0 points 0 points  What  time? 0 points 0 points  Count back from 20 0 points 0 points  Months in reverse 0 points 0 points  Repeat phrase 0 points 0 points  Total Score 0 0    Immunization History  Administered Date(s) Administered  . Pneumococcal Conjugate-13 04/15/2015  . Pneumococcal Polysaccharide-23 07/25/2013  . Tdap 08/17/2016  . Zoster 07/27/2011   Screening Tests Health Maintenance  Topic Date Due  . COLONOSCOPY  05/22/2021  . TETANUS/TDAP  08/17/2026  . DEXA SCAN  Completed  . PNA vac Low Risk Adult  Completed  . INFLUENZA VACCINE  Discontinued      Plan:    End of life planning; Advance aging; Advanced directives discussed. Copy of current HCPOA/Living Will requested.    I have personally reviewed and noted the following in the patient's chart:   . Medical and social history . Use of alcohol, tobacco or illicit drugs  . Current medications and supplements . Functional ability and status . Nutritional status . Physical activity . Advanced directives . List of other physicians . Hospitalizations, surgeries, and ER visits in previous 12 months . Vitals . Screenings to include cognitive, depression, and falls . Referrals and appointments  In addition, I have reviewed and discussed with patient certain preventive protocols, quality metrics, and best practice recommendations. A written personalized care plan for  preventive services as well as general preventive health recommendations were provided to patient.     Varney Biles, LPN  12/16/2977  Agree with plan. [name], NP

## 2018-11-16 NOTE — Patient Instructions (Addendum)
  [name] , Thank you for taking time to come for your Medicare Wellness Visit. I appreciate your ongoing commitment to your health goals. Please review the following plan we discussed and let me know if I can assist you in the future.   These are the goals we discussed: Goals      Patient Stated   . Increase physical activity (pt-stated)     Attend silver sneaker aerobic program        This is a list of the screening recommended for you and due dates:  Health Maintenance  Topic Date Due  . Colon Cancer Screening  05/22/2021  . Tetanus Vaccine  08/17/2026  . DEXA scan (bone density measurement)  Completed  . Pneumonia vaccines  Completed  . Flu Shot  Discontinued

## 2018-11-26 DIAGNOSIS — H353190 Nonexudative age-related macular degeneration, unspecified eye, stage unspecified: Secondary | ICD-10-CM | POA: Diagnosis not present

## 2018-11-27 DIAGNOSIS — Z961 Presence of intraocular lens: Secondary | ICD-10-CM | POA: Diagnosis not present

## 2018-12-17 ENCOUNTER — Encounter: Payer: Self-pay | Admitting: Family

## 2018-12-18 ENCOUNTER — Encounter: Payer: Self-pay | Admitting: Family

## 2018-12-18 DIAGNOSIS — H903 Sensorineural hearing loss, bilateral: Secondary | ICD-10-CM | POA: Diagnosis not present

## 2018-12-19 ENCOUNTER — Encounter: Payer: Self-pay | Admitting: Family

## 2018-12-19 ENCOUNTER — Other Ambulatory Visit: Payer: Self-pay | Admitting: Family

## 2018-12-20 ENCOUNTER — Encounter: Payer: Self-pay | Admitting: Family

## 2018-12-20 ENCOUNTER — Telehealth: Payer: Self-pay

## 2018-12-20 ENCOUNTER — Other Ambulatory Visit: Payer: Self-pay

## 2018-12-20 NOTE — Telephone Encounter (Signed)
I called patient to try to find out why she was requesting simvastatin. I don't see inpatient's chart where she has ever been taking this & no labs by [name] asking that she consider starting a statin.

## 2019-01-11 ENCOUNTER — Encounter: Payer: Self-pay | Admitting: Family

## 2019-01-11 ENCOUNTER — Other Ambulatory Visit: Payer: Self-pay

## 2019-01-11 DIAGNOSIS — I10 Essential (primary) hypertension: Secondary | ICD-10-CM

## 2019-01-11 MED ORDER — LOSARTAN POTASSIUM 50 MG PO TABS: 50.0000 mg | ORAL_TABLET | Freq: Every day | ORAL | 3 refills | Status: DC

## 2019-01-24 ENCOUNTER — Other Ambulatory Visit: Payer: Self-pay

## 2019-01-24 ENCOUNTER — Ambulatory Visit
Admission: RE | Admit: 2019-01-24 | Discharge: 2019-01-24 | Disposition: A | Discharge status: Home / Self Care | Payer: PPO | Source: Ambulatory Visit | Attending: Oncology | Admitting: Oncology

## 2019-01-24 DIAGNOSIS — C187 Malignant neoplasm of sigmoid colon: Secondary | ICD-10-CM | POA: Insufficient documentation

## 2019-01-24 DIAGNOSIS — R918 Other nonspecific abnormal finding of lung field: Secondary | ICD-10-CM | POA: Diagnosis not present

## 2019-01-24 DIAGNOSIS — Z95828 Presence of other vascular implants and grafts: Secondary | ICD-10-CM | POA: Diagnosis not present

## 2019-01-24 DIAGNOSIS — C189 Malignant neoplasm of colon, unspecified: Secondary | ICD-10-CM | POA: Diagnosis not present

## 2019-01-24 LAB — POCT I-STAT CREATININE: Creatinine, Ser: 0.9 mg/dL (ref 0.44–1.00)

## 2019-01-24 MED ORDER — IOHEXOL 300 MG/ML  SOLN
100.0000 mL | Freq: Once | INTRAMUSCULAR | Status: AC | PRN
  Administered 2019-01-24: 100 mL via INTRAVENOUS

## 2019-01-25 ENCOUNTER — Other Ambulatory Visit: Payer: Medicare HMO

## 2019-01-25 ENCOUNTER — Ambulatory Visit: Payer: Medicare HMO | Admitting: Oncology

## 2019-01-28 ENCOUNTER — Inpatient Hospital Stay: Payer: PPO | Attending: Oncology

## 2019-01-28 ENCOUNTER — Encounter: Payer: Self-pay | Admitting: Oncology

## 2019-01-28 ENCOUNTER — Inpatient Hospital Stay (HOSPITAL_BASED_OUTPATIENT_CLINIC_OR_DEPARTMENT_OTHER): Payer: PPO | Admitting: Oncology

## 2019-01-28 ENCOUNTER — Other Ambulatory Visit: Payer: Self-pay

## 2019-01-28 VITALS — BP 128/79 | HR 57 | Temp 97.5°F | Resp 18 | Wt 223.3 lb

## 2019-01-28 DIAGNOSIS — Z803 Family history of malignant neoplasm of breast: Secondary | ICD-10-CM | POA: Diagnosis not present

## 2019-01-28 DIAGNOSIS — Z800 Family history of malignant neoplasm of digestive organs: Secondary | ICD-10-CM | POA: Insufficient documentation

## 2019-01-28 DIAGNOSIS — R911 Solitary pulmonary nodule: Secondary | ICD-10-CM

## 2019-01-28 DIAGNOSIS — R918 Other nonspecific abnormal finding of lung field: Secondary | ICD-10-CM | POA: Insufficient documentation

## 2019-01-28 DIAGNOSIS — Z95828 Presence of other vascular implants and grafts: Secondary | ICD-10-CM

## 2019-01-28 DIAGNOSIS — I10 Essential (primary) hypertension: Secondary | ICD-10-CM | POA: Diagnosis not present

## 2019-01-28 DIAGNOSIS — C187 Malignant neoplasm of sigmoid colon: Secondary | ICD-10-CM

## 2019-01-28 DIAGNOSIS — Z8542 Personal history of malignant neoplasm of other parts of uterus: Secondary | ICD-10-CM | POA: Insufficient documentation

## 2019-01-28 DIAGNOSIS — Z85038 Personal history of other malignant neoplasm of large intestine: Secondary | ICD-10-CM | POA: Diagnosis not present

## 2019-01-28 DIAGNOSIS — Z9221 Personal history of antineoplastic chemotherapy: Secondary | ICD-10-CM | POA: Insufficient documentation

## 2019-01-28 DIAGNOSIS — Z79899 Other long term (current) drug therapy: Secondary | ICD-10-CM | POA: Insufficient documentation

## 2019-01-28 LAB — CBC WITH DIFFERENTIAL/PLATELET
Abs Immature Granulocytes: 0.01 10*3/uL (ref 0.00–0.07)
BASOS PCT: 1 %
Basophils Absolute: 0 10*3/uL (ref 0.0–0.1)
EOS PCT: 4 %
Eosinophils Absolute: 0.2 10*3/uL (ref 0.0–0.5)
HCT: 38.2 % (ref 36.0–46.0)
Hemoglobin: 12 g/dL (ref 12.0–15.0)
Immature Granulocytes: 0 %
Lymphocytes Relative: 24 %
Lymphs Abs: 0.9 10*3/uL (ref 0.7–4.0)
MCH: 28.4 pg (ref 26.0–34.0)
MCHC: 31.4 g/dL (ref 30.0–36.0)
MCV: 90.5 fL (ref 80.0–100.0)
Monocytes Absolute: 0.3 10*3/uL (ref 0.1–1.0)
Monocytes Relative: 8 %
Neutro Abs: 2.4 10*3/uL (ref 1.7–7.7)
Neutrophils Relative %: 63 %
Platelets: 200 10*3/uL (ref 150–400)
RBC: 4.22 MIL/uL (ref 3.87–5.11)
RDW: 14.9 % (ref 11.5–15.5)
WBC: 3.8 10*3/uL — ABNORMAL LOW (ref 4.0–10.5)
nRBC: 0 % (ref 0.0–0.2)

## 2019-01-28 LAB — COMPREHENSIVE METABOLIC PANEL
ALT: 19 U/L (ref 0–44)
AST: 22 U/L (ref 15–41)
Albumin: 3.6 g/dL (ref 3.5–5.0)
Alkaline Phosphatase: 105 U/L (ref 38–126)
Anion gap: 3 — ABNORMAL LOW (ref 5–15)
BUN: 15 mg/dL (ref 8–23)
CO2: 22 mmol/L (ref 22–32)
Calcium: 8.7 mg/dL — ABNORMAL LOW (ref 8.9–10.3)
Chloride: 113 mmol/L — ABNORMAL HIGH (ref 98–111)
Creatinine, Ser: 1.03 mg/dL — ABNORMAL HIGH (ref 0.44–1.00)
GFR calc Af Amer: >60 mL/min (ref >60)
GFR calc non Af Amer: 52 mL/min — ABNORMAL LOW (ref >60)
Glucose, Bld: 140 mg/dL — ABNORMAL HIGH (ref 70–99)
Potassium: 3.8 mmol/L (ref 3.5–5.1)
Sodium: 138 mmol/L (ref 135–145)
Total Bilirubin: 0.5 mg/dL (ref 0.3–1.2)
Total Protein: 6.8 g/dL (ref 6.5–8.1)

## 2019-01-28 NOTE — Progress Notes (Signed)
Patient here for follow up

## 2019-01-28 NOTE — Progress Notes (Signed)
Hematology/Oncology Follow Up visit. [place] [phone] [phone]) [phone]  CONSULT NOTE Patient Care Team: Burnard Hawthorne, FNP as PCP - General (Family Medicine) Vladimir Crofts, MD (Neurology) Noreene Filbert, MD as Referring Physician (Radiation Oncology) Leighton Ruff, MD as Consulting Physician (General Surgery) Earlie Server, MD as Consulting Physician (Oncology) Jules Husbands, MD as Consulting Physician (General Surgery)  PURPOSE OF CONSULTATION:  Follow up for treatment of Colon cancer  HISTORY OF PRESENTING ILLNESS:  [name] 78 y.o.  female with PMH listed as below was referred to me for evaluation and management of colon cancer.  She had colonoscopy evaluation due to positive cologuard.  She was found to have a nonobstructing mass approximately 20 cm from the anal verge which biopsy proved to be adenocarcinoma. CT scan showed a large mass from the sigmoid and extends beyond the wall of the colon toward the left into the surrounding fat. Preoperational CEA was normal. Patient underwent robotic sigmoidectomy on 06/07/2017.   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 MLH1  loss of function, PMS2 loss of function. Declined option of clinical trial ATOMIC She has a history of endometrial cancer s/p hysterotomy and radiation. She has a family history of colon cancer and breast cancer.  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 Results have been explained to patient by genetic counselor.   Oncology treatment S/p adjuvant chemotherapy finished February 2019 Adjuvant RT finished in April 2019.  05/22/2018 Colonoscopy surveillance by Dr.Wohl showed 10 mm polyp in ascending colon.  Removed.  Patent end-to-end colo-colonic anastomosis Pathology showed benign polyp.   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   INTERVAL HISTORY 78 yo female with above history presents for follow up of Stage IIIB colon cancer.  Patient reports doing well.  No new complaints. Denies any fatigue, shortness of breath, cough, new bone pain, or abdominal pain. During the interval patient has had surveillance CT done and present to discuss results.   Review of Systems  Constitutional: Negative for appetite change, chills, fatigue and fever.  HENT:   Negative for hearing loss and voice change.   Eyes: Negative for eye problems.  Respiratory: Negative for chest tightness and cough.   Cardiovascular: Negative for chest pain.  Gastrointestinal: Negative for abdominal distention, abdominal pain, blood in stool and diarrhea.  Endocrine: Negative for hot flashes.  Genitourinary: Negative for difficulty urinating and frequency.   Musculoskeletal: Negative for arthralgias.  Skin: Negative for itching and rash.  Neurological: Negative for extremity weakness.  Hematological: Negative for adenopathy.  Psychiatric/Behavioral: Negative for confusion.   MEDICAL HISTORY:  Past Medical History:  Diagnosis Date  . Arthritis    bitateral knees and left foot,s/p cortisone injection in past  . Chronic constipation   . Colon cancer ([place]) 2018   pt is undergoing chemo 11-09-17  . Complication of anesthesia     slow to wake up   . Endometrial cancer ([place]) 2014   Total Hysterectomy and Rad tx's.   . Family history of cancer   . Genetic testing 09/26/2017    Multi-Cancer panel (83 genes) @ [facility] - No pathogenic mutations detected  . GERD (gastroesophageal reflux disease)   . Hypertension   . IBS (irritable bowel syndrome)   . Macular degeneration of both eyes   . Menopause  age 65  . PONV (postoperative nausea and vomiting)   . Vitamin D deficiency 06/07/10    SURGICAL HISTORY: Past Surgical History:  Procedure Laterality Date  . BUNIONECTOMY    . CATARACT EXTRACTION W/PHACO Left  09/24/2018   Procedure: CATARACT EXTRACTION PHACO AND INTRAOCULAR LENS PLACEMENT ([place]) LEFT;  Surgeon: Eulogio Bear, MD;  Location: [facility];  Service: Ophthalmology;  Laterality: Left;  . CATARACT EXTRACTION W/PHACO Right 10/15/2018   Procedure: CATARACT EXTRACTION PHACO AND INTRAOCULAR LENS PLACEMENT ([place]) RIGHT;  Surgeon: Eulogio Bear, MD;  Location: [place];  Service: Ophthalmology;  Laterality: Right;  . cataract surgery     both eyes  . COLONOSCOPY WITH PROPOFOL N/A 04/04/2017   Procedure: COLONOSCOPY WITH PROPOFOL;  Surgeon: [name], MD;  Location: [facility] ENDOSCOPY;  Service: Endoscopy;  Laterality: N/A;  . COLONOSCOPY WITH PROPOFOL N/A 05/22/2018   Procedure: COLONOSCOPY WITH PROPOFOL;  Surgeon: [name], MD;  Location: [facility] ENDOSCOPY;  Service: Endoscopy;  Laterality: N/A;  . DILATION AND CURETTAGE OF UTERUS  1971  . FOOT SURGERY    . NASAL SINUS SURGERY    . PORTACATH PLACEMENT Right 07/12/2017   Procedure: INSERTION PORT-A-CATH;  Surgeon: Jules Husbands, MD;  Location: [facility]S;  Service: General;  Laterality: Right;  . ROBOTIC ASSISTED LAPAROSCOPIC VENTRAL/INCISIONAL HERNIA REPAIR N/A 06/07/2017   Procedure: ROBOTIC INCISIONAL HERNIA REPAIR;  Surgeon: Leighton Ruff, MD;  Location: [facility];  Service: General;  Laterality: N/A;  . VAGINAL DELIVERY    . VAGINAL HYSTERECTOMY      SOCIAL HISTORY: Social History   Socioeconomic History  . Marital status: Divorced    Spouse name: Not on file  . Number of children: Not on file  . Years of education: Not on file  . Highest education level: Not on file  Occupational History  . Not on file  Social Needs  . Financial resource strain: Not hard at all  . Food insecurity:    Worry: Never true    Inability: Never true  . Transportation needs:    Medical: No    Non-medical: No  Tobacco Use  . Smoking status: Never Smoker  . Smokeless tobacco: Never Used  Substance and Sexual Activity  .  Alcohol use: Yes    Comment: rarely - Holidays  . Drug use: No  . Sexual activity: Never  Lifestyle  . Physical activity:    Days per week: Not on file    Minutes per session: Not on file  . Stress: Not at all  Relationships  . Social connections:    Talks on phone: Not on file    Gets together: Not on file    Attends religious service: Not on file    Active member of club or organization: Not on file    Attends meetings of clubs or organizations: Not on file    Relationship status: Not on file  . Intimate partner violence:    Fear of current or ex partner: Not on file    Emotionally abused: Not on file    Physically abused: Not on file    Forced sexual activity: Not on file  Other Topics Concern  . Not on file  Social History Narrative   Exercise- aerobic exercise at home 3-4x week.       Lives in [place]. No pets.    FAMILY HISTORY: Family History  Problem Relation Age of Onset  . Heart disease Father 57  massive MI  . Diabetes Father   . Bipolar disorder Sister   . Osteoporosis Sister   . Breast cancer Sister 15       currently 36  . Cancer Sister        breast  . Bipolar disorder Maternal Aunt   . Breast cancer Maternal Aunt        age at dx unknown; deceased 84s  . Colon cancer Maternal Grandfather 52       deceased 51  . Cancer Maternal Grandfather        breast  . Osteoporosis Mother   . Colon cancer Mother        age at dx unknown; deceased 46  . Cancer Maternal Grandmother        unk. type; deceased 41  . Breast cancer Paternal Aunt        deceased 64    ALLERGIES:  is allergic to codeine.  MEDICATIONS:  Current Outpatient Medications  Medication Sig Dispense Refill  . Docusate Calcium (STOOL SOFTENER PO) Take by mouth daily as needed.    . fluticasone (FLONASE) 50 MCG/ACT nasal spray Place 2 sprays into both nostrils daily. (Patient taking differently: Place 2 sprays into both nostrils daily as needed. ) 16 g 2  . lidocaine-prilocaine  (EMLA) cream Apply 1 application topically as needed. 30 g 0  . losartan (COZAAR) 50 MG tablet Take 1 tablet (50 mg total) by mouth daily. 90 tablet 3  . Magnesium 400 MG TABS Take 400 mg by mouth daily as needed (irritable bowel syndrome).    . metoprolol succinate (TOPROL-XL) 100 MG 24 hr tablet TAKE 1 TABLET DAILY 90 tablet 3  . Multiple Vitamin (MULTIVITAMIN WITH MINERALS) TABS tablet Take 1 tablet by mouth daily.    . Multiple Vitamins-Minerals (ICAPS AREDS 2 PO) Take 2 capsules by mouth daily.    Marland Kitchen topiramate (TOPAMAX) 25 MG tablet Take by mouth.    . topiramate (TOPAMAX) 50 MG tablet TAKE 1 TABLET TWICE DAILY 180 tablet 0  . ondansetron (ZOFRAN) 8 MG tablet Take 1 tablet (8 mg total) by mouth 2 (two) times daily as needed for refractory nausea / vomiting. Start on day 3 after chemotherapy. (Patient not taking: Reported on 01/28/2019) 30 tablet 1  . phenylephrine (SUDAFED PE) 10 MG TABS tablet Take 10 mg by mouth every 4 (four) hours as needed.    . Polyethylene Glycol 3350 (MIRALAX PO) Take 17 g by mouth daily as needed (constipation).     . prochlorperazine (COMPAZINE) 10 MG tablet Take 1 tablet (10 mg total) by mouth every 6 (six) hours as needed (Nausea or vomiting). (Patient not taking: Reported on 01/28/2019) 30 tablet 1   No current facility-administered medications for this visit.    Facility-Administered Medications Ordered in Other Visits  Medication Dose Route Frequency Provider Last Rate Last Dose  . sodium chloride flush (NS) 0.9 % injection 10 mL  10 mL Intracatheter PRN Earlie Server, MD   10 mL at 11/08/17 1400      .  PHYSICAL EXAMINATION: ECOG PERFORMANCE STATUS: 0 - Asymptomatic Vitals:   01/28/19 0920  BP: 128/79  Pulse: (!) 57  Resp: 18  Temp: (!) 97.5 F (36.4 C)   Filed Weights   01/28/19 0920  Weight: 223 lb 4.8 oz (101.3 kg)   Physical Exam Constitutional:      General: She is not in acute distress.    Appearance: She is well-developed.  HENT:  Head: Normocephalic and atraumatic.     Mouth/Throat:     Pharynx: No oropharyngeal exudate.  Eyes:     General: No scleral icterus.    Conjunctiva/sclera: Conjunctivae normal.     Pupils: Pupils are equal, round, and reactive to light.  Neck:     Musculoskeletal: Normal range of motion and neck supple.  Cardiovascular:     Rate and Rhythm: Normal rate and regular rhythm.     Heart sounds: Normal heart sounds. No murmur.  Pulmonary:     Effort: Pulmonary effort is normal. No respiratory distress.     Breath sounds: Normal breath sounds. No stridor. No wheezing or rales.  Chest:     Chest wall: No tenderness.  Abdominal:     General: Bowel sounds are normal. There is no distension.     Palpations: Abdomen is soft. There is no mass.     Tenderness: There is no abdominal tenderness. There is no guarding.  Musculoskeletal: Normal range of motion.        General: No deformity.  Lymphadenopathy:     Cervical: No cervical adenopathy.  Skin:    General: Skin is warm and dry.     Findings: No erythema or rash.     Comments: Chest wall port.   Neurological:     Mental Status: She is alert and oriented to person, place, and time.     Cranial Nerves: No cranial nerve deficit.     Coordination: Coordination normal.  Psychiatric:        Behavior: Behavior normal.        Thought Content: Thought content normal.     LABORATORY DATA:  I have reviewed the data as listed Lab Results  Component Value Date   WBC 3.8 (L) 01/28/2019   HGB 12.0 01/28/2019   HCT 38.2 01/28/2019   MCV 90.5 01/28/2019   PLT 200 01/28/2019   Recent Labs    08/02/18 1436 10/25/18 1259 01/24/19 0940 01/28/19 0839  NA 141 139  --  138  K 4.0 4.0  --  3.8  CL 111 110  --  113*  CO2 24 23  --  22  GLUCOSE 88 133*  --  140*  BUN 14 20  --  15  CREATININE 0.78 0.96 0.90 1.03*  CALCIUM 8.8* 9.1  --  8.7*  GFRNONAA >60 57*  --  52*  GFRAA >60 >60  --  >60  PROT 6.6 6.8  --  6.8  ALBUMIN 3.7 3.8  --  3.6   AST 24 25  --  22  ALT 19 18  --  19  ALKPHOS 90 102  --  105  BILITOT 0.8 0.9  --  0.5    RADIOGRAPHIC STUDIES: I have personally reviewed the radiological images as listed and agreed with the findings in the report. 04/12/2017 CT abdomen pelvis w contrast Large mass arising from the sigmoid colon. This mass extends beyond the wall of the colon toward the left into the surrounding fat. This lesion does not reach the left pelvic sidewall.No adenopathy or liver lesions evident. No omental lesions appreciable. Sizable midline ventral hernia slightly superior to theumbilicus containing fat and a loop of colon without bowel compromise. Much smaller midline umbilical ventral hernia containingonly fat. There is a small hiatal hernia. No bowel obstruction.  No abscess.  Appendix region appears normal. No renal or ureteral calculus.  No hydronephrosis.There is aortoiliac atherosclerosis. There are foci of coronary artery calcification.  RADIOGRAPHIC STUDIES:  I have personally reviewed the radiological images as listed and agreed with the findings in the report. 07/31/2018  No evidence of recurrent or metastatic carcinoma, or other significant abnormality.  Aortic and coronary artery atherosclerosis incidentally noted  01/24/2019 CT chest abdomen pelvis with contrast showed a stable exam.  No evidence of recurrent or metastatic carcinoma within the chest, abdomen, or pelvis.  Aortic coronary artery atherosclerosis. Stable 4 mm pulmonary nodule  ASSESSMENT & PLAN:  78 y.o.female who has Stage IIIB colon cancer.  Cancer Staging Cancer of descending colon [facility]) Staging form: Colon and Rectum, AJCC 8th Edition - Pathologic: Stage IIIB (pT4a, pN1, cM0) - Signed by Earlie Server, MD on 04/30/2018  1. History of colon cancer   2. Nodule of right lung   3. Port-A-Cath in place    Patient is clinically doing satisfactorily.  Labs are reviewed and discussed with patient. Today's CEA is pending. CT chest  abdomen pelvis was independently reviewed by me and discussed with patient.  No evidence of disease recurrence. Small lung nodule has been stable likely benign.  Port-A-Cath, continue port flush every 6 weeks.  Plan to keep Mediport for 2 years. Will discuss about port removal after July 2020.  Recommend patient to follow-up and repeat labs with CBC, CMP, CEA in 3 months, follow-up in the clinic with repeat labs and MD assessment and CT scan in 6 months. Orders Placed This Encounter  Procedures  . CT Chest W Contrast    Standing Status:   Future    Standing Expiration Date:   01/28/2020    Order Specific Question:   ** REASON FOR EXAM (FREE TEXT)    Answer:   Colon cancer    Order Specific Question:   If indicated for the ordered procedure, I authorize the administration of contrast media per Radiology protocol    Answer:   Yes    Order Specific Question:   Preferred imaging location?    Answer:   [facility]    Order Specific Question:   Radiology Contrast Protocol - do NOT remove file path    Answer:   \\[email or web address]  . CT Abdomen Pelvis W Contrast    Standing Status:   Future    Standing Expiration Date:   01/28/2020    Order Specific Question:   ** REASON FOR EXAM (FREE TEXT)    Answer:   Colon cancer    Order Specific Question:   If indicated for the ordered procedure, I authorize the administration of contrast media per Radiology protocol    Answer:   Yes    Order Specific Question:   Preferred imaging location?    Answer:   [facility]    Order Specific Question:   Is Oral Contrast requested for this exam?    Answer:   Yes, Per Radiology protocol    Order Specific Question:   Radiology Contrast Protocol - do NOT remove file path    Answer:   \\[email or web address]  . CBC with Differential/Platelet    Standing Status:   Standing    Number of Occurrences:   5    Standing Expiration Date:   01/28/2020  .  Comprehensive metabolic panel    Standing Status:   Standing    Number of Occurrences:   5    Standing Expiration Date:   01/28/2020    Return of visit: 6 months   Earlie Server, MD, PhD Hematology Oncology [facility] Pager- [phone] 01/28/2019

## 2019-01-29 LAB — CEA: CEA: 1.3 ng/mL (ref 0.0–4.7)

## 2019-02-01 ENCOUNTER — Other Ambulatory Visit: Payer: Self-pay

## 2019-02-01 ENCOUNTER — Inpatient Hospital Stay: Payer: PPO | Attending: Oncology

## 2019-02-01 DIAGNOSIS — Z452 Encounter for adjustment and management of vascular access device: Secondary | ICD-10-CM | POA: Insufficient documentation

## 2019-02-01 DIAGNOSIS — Z85038 Personal history of other malignant neoplasm of large intestine: Secondary | ICD-10-CM | POA: Diagnosis not present

## 2019-02-01 MED ORDER — HEPARIN SOD (PORK) LOCK FLUSH 100 UNIT/ML IV SOLN
500.0000 [IU] | Freq: Once | INTRAVENOUS | Status: AC
  Administered 2019-02-01: 500 [IU] via INTRAVENOUS

## 2019-02-01 MED ORDER — SODIUM CHLORIDE 0.9% FLUSH
10.0000 mL | Freq: Once | INTRAVENOUS | Status: AC
  Administered 2019-02-01: 10 mL via INTRAVENOUS
  Filled 2019-02-01: qty 10

## 2019-03-02 ENCOUNTER — Other Ambulatory Visit: Payer: Self-pay | Admitting: Nurse Practitioner

## 2019-03-15 ENCOUNTER — Other Ambulatory Visit: Payer: Self-pay

## 2019-03-15 ENCOUNTER — Inpatient Hospital Stay: Payer: PPO | Attending: Oncology

## 2019-03-15 DIAGNOSIS — Z452 Encounter for adjustment and management of vascular access device: Secondary | ICD-10-CM | POA: Insufficient documentation

## 2019-03-15 DIAGNOSIS — Z85038 Personal history of other malignant neoplasm of large intestine: Secondary | ICD-10-CM | POA: Diagnosis not present

## 2019-03-15 DIAGNOSIS — Z95828 Presence of other vascular implants and grafts: Secondary | ICD-10-CM

## 2019-03-15 MED ORDER — SODIUM CHLORIDE 0.9% FLUSH
10.0000 mL | Freq: Once | INTRAVENOUS | Status: AC
  Administered 2019-03-15: 10 mL via INTRAVENOUS
  Filled 2019-03-15: qty 10

## 2019-03-15 MED ORDER — HEPARIN SOD (PORK) LOCK FLUSH 100 UNIT/ML IV SOLN
500.0000 [IU] | Freq: Once | INTRAVENOUS | Status: AC
  Administered 2019-03-15: 500 [IU] via INTRAVENOUS

## 2019-04-15 ENCOUNTER — Encounter: Payer: Self-pay | Admitting: Family

## 2019-04-17 ENCOUNTER — Encounter: Payer: Self-pay | Admitting: Family

## 2019-04-17 ENCOUNTER — Other Ambulatory Visit: Payer: Self-pay

## 2019-04-17 ENCOUNTER — Emergency Department
Admission: EM | Admit: 2019-04-17 | Discharge: 2019-04-17 | Disposition: A | Discharge status: Home / Self Care | Payer: PPO | Attending: Emergency Medicine | Admitting: Emergency Medicine

## 2019-04-17 ENCOUNTER — Ambulatory Visit (INDEPENDENT_AMBULATORY_CARE_PROVIDER_SITE_OTHER): Payer: PPO | Admitting: Family

## 2019-04-17 ENCOUNTER — Encounter: Payer: Self-pay | Admitting: Emergency Medicine

## 2019-04-17 ENCOUNTER — Ambulatory Visit
Admission: RE | Admit: 2019-04-17 | Discharge: 2019-04-17 | Disposition: A | Discharge status: Home / Self Care | Payer: PPO | Source: Ambulatory Visit | Attending: Family | Admitting: Family

## 2019-04-17 ENCOUNTER — Telehealth: Payer: Self-pay

## 2019-04-17 DIAGNOSIS — M7989 Other specified soft tissue disorders: Secondary | ICD-10-CM

## 2019-04-17 DIAGNOSIS — I82491 Acute embolism and thrombosis of other specified deep vein of right lower extremity: Secondary | ICD-10-CM | POA: Insufficient documentation

## 2019-04-17 DIAGNOSIS — I82441 Acute embolism and thrombosis of right tibial vein: Secondary | ICD-10-CM | POA: Diagnosis not present

## 2019-04-17 DIAGNOSIS — I10 Essential (primary) hypertension: Secondary | ICD-10-CM | POA: Insufficient documentation

## 2019-04-17 DIAGNOSIS — Z79899 Other long term (current) drug therapy: Secondary | ICD-10-CM | POA: Diagnosis not present

## 2019-04-17 DIAGNOSIS — R2243 Localized swelling, mass and lump, lower limb, bilateral: Secondary | ICD-10-CM | POA: Diagnosis present

## 2019-04-17 DIAGNOSIS — R600 Localized edema: Secondary | ICD-10-CM | POA: Diagnosis not present

## 2019-04-17 LAB — COMPREHENSIVE METABOLIC PANEL
ALT: 16 U/L (ref 0–44)
AST: 23 U/L (ref 15–41)
Albumin: 3.8 g/dL (ref 3.5–5.0)
Alkaline Phosphatase: 111 U/L (ref 38–126)
Anion gap: 9 (ref 5–15)
BUN: 15 mg/dL (ref 8–23)
CO2: 21 mmol/L — ABNORMAL LOW (ref 22–32)
Calcium: 9.2 mg/dL (ref 8.9–10.3)
Chloride: 111 mmol/L (ref 98–111)
Creatinine, Ser: 0.99 mg/dL (ref 0.44–1.00)
GFR calc Af Amer: >60 mL/min (ref >60)
GFR calc non Af Amer: 55 mL/min — ABNORMAL LOW (ref >60)
Glucose, Bld: 105 mg/dL — ABNORMAL HIGH (ref 70–99)
Potassium: 3.9 mmol/L (ref 3.5–5.1)
Sodium: 141 mmol/L (ref 135–145)
Total Bilirubin: 0.8 mg/dL (ref 0.3–1.2)
Total Protein: 7.1 g/dL (ref 6.5–8.1)

## 2019-04-17 LAB — CBC WITH DIFFERENTIAL/PLATELET
Abs Immature Granulocytes: 0.01 10*3/uL (ref 0.00–0.07)
Basophils Absolute: 0 10*3/uL (ref 0.0–0.1)
Basophils Relative: 1 %
Eosinophils Absolute: 0.2 10*3/uL (ref 0.0–0.5)
Eosinophils Relative: 4 %
HCT: 38.3 % (ref 36.0–46.0)
Hemoglobin: 12.3 g/dL (ref 12.0–15.0)
Immature Granulocytes: 0 %
Lymphocytes Relative: 24 %
Lymphs Abs: 1.2 10*3/uL (ref 0.7–4.0)
MCH: 29.1 pg (ref 26.0–34.0)
MCHC: 32.1 g/dL (ref 30.0–36.0)
MCV: 90.5 fL (ref 80.0–100.0)
Monocytes Absolute: 0.4 10*3/uL (ref 0.1–1.0)
Monocytes Relative: 8 %
Neutro Abs: 3.1 10*3/uL (ref 1.7–7.7)
Neutrophils Relative %: 63 %
Platelets: 201 10*3/uL (ref 150–400)
RBC: 4.23 MIL/uL (ref 3.87–5.11)
RDW: 14.1 % (ref 11.5–15.5)
WBC: 4.9 10*3/uL (ref 4.0–10.5)
nRBC: 0 % (ref 0.0–0.2)

## 2019-04-17 LAB — BRAIN NATRIURETIC PEPTIDE: B Natriuretic Peptide: 215 pg/mL — ABNORMAL HIGH (ref 0.0–100.0)

## 2019-04-17 MED ORDER — POTASSIUM CHLORIDE ER 10 MEQ PO TBCR: 10.0000 meq | EXTENDED_RELEASE_TABLET | Freq: Every day | ORAL | 1 refills | Status: DC

## 2019-04-17 MED ORDER — FUROSEMIDE 20 MG PO TABS: 20.0000 mg | ORAL_TABLET | Freq: Every day | ORAL | 1 refills | Status: DC

## 2019-04-17 MED ORDER — RIVAROXABAN (XARELTO) VTE STARTER PACK (15 & 20 MG): ORAL_TABLET | ORAL | 0 refills | Status: DC

## 2019-04-17 NOTE — ED Provider Notes (Signed)
[facility] Emergency Department Provider Note  ____________________________________________  Time seen: Approximately 11:43 PM  I have reviewed the triage vital signs and the nursing notes.   HISTORY  Chief Complaint Leg Swelling (DVT R side)    HPI [name] is a 78 y.o. female presents to the emergency department as a referral from primary care given DVT identified on ultrasound conducted earlier today.  Patient reports that she has noticed bilateral lower extremity swelling over the past 2 to 3 days.  She denies calf pain or lower extremity redness.  Patient states that she has a 2 time cancer survivor.  She denies prolonged immobilization, recent surgery, use of exogenous hormones, daily smoking or recent travel.  She denies pleuritic chest pain or shortness of breath.  She denies a history of similar symptoms in the past.        Past Medical History:  Diagnosis Date  . Arthritis    bitateral knees and left foot,s/p cortisone injection in past  . Chronic constipation   . Colon cancer ([place]) 2018   pt is undergoing chemo 11-09-17  . Complication of anesthesia     slow to wake up   . Endometrial cancer ([place]) 2014   Total Hysterectomy and Rad tx's.   . Family history of cancer   . Genetic testing 09/26/2017    Multi-Cancer panel (83 genes) @ [facility] - No pathogenic mutations detected  . GERD (gastroesophageal reflux disease)   . Hypertension   . IBS (irritable bowel syndrome)   . Macular degeneration of both eyes   . Menopause    age 11  . PONV (postoperative nausea and vomiting)   . Vitamin D deficiency 06/07/10    Patient Active Problem List   Diagnosis Date Noted  . Leg swelling 04/17/2019  . Personal history of colon cancer   . Benign neoplasm of ascending colon   . Genetic testing 09/26/2017  . Family history of cancer   . Cancer of descending colon ([place]) 06/07/2017  . Abnormal stools   . Neoplasm of digestive system   .  Bradycardia 02/14/2017  . HLD (hyperlipidemia) 02/13/2017  . Onychomycosis 12/29/2016  . Osteopenia 08/16/2016  . Macular degeneration 08/12/2015  . Routine general medical examination at a health care facility 09/26/2014  . Chronic constipation 05/27/2014  . Bartholin cyst 03/28/2014  . History of endometrial cancer 12/30/2013  . Essential hypertension, benign 04/17/2013  . Tremor 07/08/2011    Past Surgical History:  Procedure Laterality Date  . BUNIONECTOMY    . CATARACT EXTRACTION W/PHACO Left 09/24/2018   Procedure: CATARACT EXTRACTION PHACO AND INTRAOCULAR LENS PLACEMENT ([place]) LEFT;  Surgeon: Eulogio Bear, MD;  Location: [place];  Service: Ophthalmology;  Laterality: Left;  . CATARACT EXTRACTION W/PHACO Right 10/15/2018   Procedure: CATARACT EXTRACTION PHACO AND INTRAOCULAR LENS PLACEMENT ([place]) RIGHT;  Surgeon: Eulogio Bear, MD;  Location: [place];  Service: Ophthalmology;  Laterality: Right;  . cataract surgery     both eyes  . COLONOSCOPY WITH PROPOFOL N/A 04/04/2017   Procedure: COLONOSCOPY WITH PROPOFOL;  Surgeon: [name], MD;  Location: [facility] ENDOSCOPY;  Service: Endoscopy;  Laterality: N/A;  . COLONOSCOPY WITH PROPOFOL N/A 05/22/2018   Procedure: COLONOSCOPY WITH PROPOFOL;  Surgeon: [name], MD;  Location: [facility] ENDOSCOPY;  Service: Endoscopy;  Laterality: N/A;  . DILATION AND CURETTAGE OF UTERUS  1971  . FOOT SURGERY    . NASAL SINUS SURGERY    . PORTACATH PLACEMENT Right  07/12/2017   Procedure: INSERTION PORT-A-CATH;  Surgeon: Jules Husbands, MD;  Location: [facility] ORS;  Service: General;  Laterality: Right;  . ROBOTIC ASSISTED LAPAROSCOPIC VENTRAL/INCISIONAL HERNIA REPAIR N/A 06/07/2017   Procedure: ROBOTIC INCISIONAL HERNIA REPAIR;  Surgeon: Leighton Ruff, MD;  Location: [facility] ORS;  Service: General;  Laterality: N/A;  . VAGINAL DELIVERY    . VAGINAL HYSTERECTOMY      Prior to Admission medications   Medication Sig Start Date End  Date Taking? Authorizing Provider  Docusate Calcium (STOOL SOFTENER PO) Take by mouth daily as needed.    [provider]  fluticasone (FLONASE) 50 MCG/ACT nasal spray Place 2 sprays into both nostrils daily. Patient taking differently: Place 2 sprays into both nostrils daily as needed.  08/08/16   Burnard Hawthorne, FNP  furosemide (LASIX) 20 MG tablet Take 1 tablet (20 mg total) by mouth daily for 7 days. 04/17/19 04/24/19  Lannie Fields, PA-C  lidocaine-prilocaine (EMLA) cream Apply 1 application topically as needed. 07/07/17   Earlie Server, MD  losartan (COZAAR) 50 MG tablet Take 1 tablet (50 mg total) by mouth daily. 01/11/19   Burnard Hawthorne, FNP  Magnesium 400 MG TABS Take 400 mg by mouth daily as needed (irritable bowel syndrome).    [provider]  metoprolol succinate (TOPROL-XL) 100 MG 24 hr tablet TAKE 1 TABLET DAILY 03/23/18   Burnard Hawthorne, FNP  Multiple Vitamin (MULTIVITAMIN WITH MINERALS) TABS tablet Take 1 tablet by mouth daily.    [provider]  Multiple Vitamins-Minerals (ICAPS AREDS 2 PO) Take 2 capsules by mouth daily.    [provider]  phenylephrine (SUDAFED PE) 10 MG TABS tablet Take 10 mg by mouth every 4 (four) hours as needed.    [provider]  Polyethylene Glycol 3350 (MIRALAX PO) Take 17 g by mouth daily as needed (constipation).     [provider]  potassium chloride (K-DUR) 10 MEQ tablet Take 1 tablet (10 mEq total) by mouth daily for 3 days. With lasix. 04/17/19 04/20/19  Burnard Hawthorne, FNP  Rivaroxaban 15 & 20 MG TBPK Take as directed on package: Start with one 15mg  tablet by mouth twice a day with food. On Day 22, switch to one 20mg  tablet once a day with food. 04/17/19   Lannie Fields, PA-C  topiramate (TOPAMAX) 25 MG tablet Take by mouth. 11/05/18 02/03/19  [provider]  topiramate (TOPAMAX) 50 MG tablet TAKE 1 TABLET TWICE DAILY 10/05/18   Burnard Hawthorne, FNP  prochlorperazine  (COMPAZINE) 10 MG tablet Take 1 tablet (10 mg total) by mouth every 6 (six) hours as needed (Nausea or vomiting). Patient not taking: Reported on 01/28/2019 07/20/17 03/02/19  Earlie Server, MD    Allergies Codeine  Family History  Problem Relation Age of Onset  . Heart disease Father 62       massive MI  . Diabetes Father   . Bipolar disorder Sister   . Osteoporosis Sister   . Breast cancer Sister 27       currently 41  . Cancer Sister        breast  . Bipolar disorder Maternal Aunt   . Breast cancer Maternal Aunt        age at dx unknown; deceased 66s  . Colon cancer Maternal Grandfather 12       deceased 58  . Cancer Maternal Grandfather        breast  . Osteoporosis Mother   .  Colon cancer Mother        age at dx unknown; deceased 66  . Cancer Maternal Grandmother        unk. type; deceased 36  . Breast cancer Paternal Aunt        deceased 7    Social History Social History   Tobacco Use  . Smoking status: Never Smoker  . Smokeless tobacco: Never Used  Substance Use Topics  . Alcohol use: Yes    Comment: rarely - Holidays  . Drug use: No     Review of Systems  Constitutional: No fever/chills Eyes: No visual changes. No discharge ENT: No upper respiratory complaints. Cardiovascular: no chest pain. Respiratory: no cough. No SOB. Gastrointestinal: No abdominal pain.  No nausea, no vomiting.  No diarrhea.  No constipation. Genitourinary: Negative for dysuria. No hematuria Musculoskeletal: Negative for musculoskeletal pain. Skin: Patient has bilateral lower extremity edema.  Neurological: Negative for headaches, focal weakness or numbness.   ____________________________________________   PHYSICAL EXAM:  VITAL SIGNS: ED Triage Vitals  Enc Vitals Group     BP 04/17/19 1753 (!) 172/70     Pulse Rate 04/17/19 1753 65     Resp 04/17/19 1753 18     Temp 04/17/19 1753 98.1 F (36.7 C)     Temp Source 04/17/19 1753 Oral     SpO2 04/17/19 1753 94 %     Weight  04/17/19 1753 213 lb (96.6 kg)     Height 04/17/19 1753 5\' 5"  (1.651 m)     Head Circumference --      Peak Flow --      Pain Score 04/17/19 1756 0     Pain Loc --      Pain Edu? --      Excl. in [place]? --      Constitutional: Alert and oriented. Well appearing and in no acute distress. Eyes: Conjunctivae are normal. PERRL. EOMI. Head: Atraumatic. Cardiovascular: Normal rate, regular rhythm. Normal S1 and S2.  Good peripheral circulation. Respiratory: Normal respiratory effort without tachypnea or retractions. Lungs CTAB. Good air entry to the bases with no decreased or absent breath sounds. Gastrointestinal: Bowel sounds 4 quadrants. Soft and nontender to palpation. No guarding or rigidity. No palpable masses. No distention. No CVA tenderness. Musculoskeletal: Full range of motion to all extremities. No gross deformities appreciated.  No calf pain to palpation bilaterally.  Patient has 1+ pitting edema bilaterally. Neurologic:  Normal speech and language. No gross focal neurologic deficits are appreciated.  Skin: No erythema of the lower extremities. Psychiatric: Mood and affect are normal. Speech and behavior are normal. Patient exhibits appropriate insight and judgement.   ____________________________________________   LABS (all labs ordered are listed, but only abnormal results are displayed)  Labs Reviewed  COMPREHENSIVE METABOLIC PANEL - Abnormal; Notable for the following components:      Result Value   CO2 21 (*)    Glucose, Bld 105 (*)    GFR calc non Af Amer 55 (*)    All other components within normal limits  BRAIN NATRIURETIC PEPTIDE - Abnormal; Notable for the following components:   B Natriuretic Peptide 215.0 (*)    All other components within normal limits  CBC WITH DIFFERENTIAL/PLATELET   ____________________________________________  EKG   ____________________________________________  RADIOLOGY I personally viewed and evaluated these images as part of my  medical decision making, as well as reviewing the written report by the radiologist.  US Venous Img Lower Bilateral  Result Date: 04/17/2019 CLINICAL  DATA:  Bilateral lower extremity pain and edema left greater than right. Evaluate for DVT. EXAM: BILATERAL LOWER EXTREMITY VENOUS DOPPLER ULTRASOUND TECHNIQUE: Gray-scale sonography with graded compression, as well as color Doppler and duplex ultrasound were performed to evaluate the lower extremity deep venous systems from the level of the common femoral vein and including the common femoral, femoral, profunda femoral, popliteal and calf veins including the posterior tibial, peroneal and gastrocnemius veins when visible. The superficial great saphenous vein was also interrogated. Spectral Doppler was utilized to evaluate flow at rest and with distal augmentation maneuvers in the common femoral, femoral and popliteal veins. COMPARISON:  None. FINDINGS: RIGHT LOWER EXTREMITY Common Femoral Vein: No evidence of thrombus. Normal compressibility, respiratory phasicity and response to augmentation. Saphenofemoral Junction: No evidence of thrombus. Normal compressibility and flow on color Doppler imaging. Profunda Femoral Vein: No evidence of thrombus. Normal compressibility and flow on color Doppler imaging. Femoral Vein: No evidence of thrombus. Normal compressibility, respiratory phasicity and response to augmentation. Popliteal Vein: No evidence of thrombus. Normal compressibility, respiratory phasicity and response to augmentation. Calf Veins: There is age-indeterminate hypoechoic occlusive DVT involving 1 of the paired right posterior tibial veins (images 30 and 31). The adjacent paired posterior tibial vein appears patent as do the peroneal veins. Superficial Great Saphenous Vein: No evidence of thrombus. Normal compressibility. Venous Reflux:  None. Other Findings:  None. LEFT LOWER EXTREMITY Common Femoral Vein: No evidence of thrombus. Normal compressibility,  respiratory phasicity and response to augmentation. Saphenofemoral Junction: No evidence of thrombus. Normal compressibility and flow on color Doppler imaging. Profunda Femoral Vein: No evidence of thrombus. Normal compressibility and flow on color Doppler imaging. Femoral Vein: No evidence of thrombus. Normal compressibility, respiratory phasicity and response to augmentation. Popliteal Vein: No evidence of thrombus. Normal compressibility, respiratory phasicity and response to augmentation. Calf Veins: No evidence of thrombus. Normal compressibility and flow on color Doppler imaging. Superficial Great Saphenous Vein: No evidence of thrombus. Normal compressibility. Venous Reflux:  None. Other Findings:  None. IMPRESSION: 1. Examination is positive for age-indeterminate occlusive DVT involving one of the paired right posterior tibial veins. There is no extension of this age-indeterminate distal right tibial DVT to the more proximal venous system of the right lower extremity. 2. No evidence of acute or chronic DVT within the left lower extremity. Electronically Signed   By: Sandi Mariscal M.D.   On: 04/17/2019 16:43    ____________________________________________    PROCEDURES  Procedure(s) performed:    Procedures    Medications - No data to display   ____________________________________________   INITIAL IMPRESSION / ASSESSMENT AND PLAN / ED COURSE  Pertinent labs & imaging results that were available during my care of the patient were reviewed by me and considered in my medical decision making (see chart for details).  Review of the [place] CSRS was performed in accordance of the [place] prior to dispensing any controlled drugs.           Assessment and plan DVT Patient presents to the emergency department with bilateral lower extremity edema that she has noticed over the past 2 to 3 days.  On physical exam, patient has 1+ pitting edema bilaterally.  No erythema of the bilateral lower  extremities or calf pain to palpation.   Differential diagnosis included DVT, venous insufficiency, CHF and nephrotic syndrome...   Venous ultrasound is concerning for thromboembolism in the right tibial veins of the calf.  Patient was started on Xarelto and furosemide.  BNP was elevated at  215.  patient has a follow-up appointment with her primary care provider on Monday.  Patient was advised to keep appointment and to update primary care regarding Xarelto and furosemide usage.  She voiced understanding.  All patient questions were answered.    ____________________________________________  FINAL CLINICAL IMPRESSION(S) / ED DIAGNOSES  Final diagnoses:  Leg swelling  Acute deep vein thrombosis (DVT) of tibial vein of right lower extremity (HCC)      NEW MEDICATIONS STARTED DURING THIS VISIT:  ED Discharge Orders         Ordered    Rivaroxaban 15 & 20 MG TBPK     04/17/19 2053    furosemide (LASIX) 20 MG tablet  Daily     04/17/19 2053              This chart was dictated using voice recognition software/Dragon. Despite best efforts to proofread, errors can occur which can change the meaning. Any change was purely unintentional.    Lannie Fields, PA-C 04/17/19 2349    Schuyler Amor, MD 04/20/19 [phone]

## 2019-04-17 NOTE — ED Triage Notes (Signed)
Pt presents to ED referred by PCP for US done today positive for DVT of RLE. Pt denies pain at this time.

## 2019-04-17 NOTE — Telephone Encounter (Signed)
I called patient & made her an appointment today for leg ankle/feet swelling.

## 2019-04-17 NOTE — Assessment & Plan Note (Addendum)
Moderate risk for DVT based on Wells criteria.  Concern with unilateral swelling.  Pending stat ultrasound.  If negative for DVT, advised patient she may start Lasix with potassium chloride for up to 3 days.  We will watch her closely.  Pending echocardiogram as well.  repeat BMP and follow-up scheduled as well for next week.

## 2019-04-17 NOTE — Discharge Instructions (Signed)
Take Xarelto as instructed. Start Lasix once daily until you are seen by your primary care provider.

## 2019-04-17 NOTE — Progress Notes (Signed)
Verbal consent for services obtained from patient prior to services given.  Location of call:  provider at work patient at home  Names of all persons present for services: [name], NP Chief complaint:   CC: BL leg swelling x several days ago, sudden onset.    Swelling worse in Left than right. Right foot has improved somewhat.  More swelling in ankle than in foot. Not worse at end of the day. No improvement in elevation.  No calf pain, redness, discoloration,  rash, increased heat.No insect bite, sob, cp. No weeping.  No injury.   No h/o dvt. No hormones.   No recent surgery, decreased activity. Not 'super active' at baseline.   History, background, results pertinent:  H/o colon cancer s/p chemo and radiation, completed 03/2018; follows with Dr Tasia Catchings for surveillance.   A/P/next steps: Problem List Items Addressed This Visit      Other   Leg swelling - Primary    Moderate risk for DVT based on Wells criteria.  Concern with unilateral swelling.  Pending stat ultrasound.  If negative for DVT, advised patient she may start Lasix with potassium chloride for up to 3 days.  We will watch her closely.  Pending echocardiogram as well.  repeat BMP and follow-up scheduled as well for next week.      Relevant Medications   furosemide (LASIX) 20 MG tablet   potassium chloride (K-DUR) 10 MEQ tablet   Other Relevant Orders   US Venous Img Lower Bilateral   ECHOCARDIOGRAM COMPLETE   Basic metabolic panel       I spent 25 min  discussing plan of care over the phone.

## 2019-04-17 NOTE — Patient Instructions (Addendum)
Toes above the nose.   Compression stockings.   If ultrasound is negative for blood clot, as discussed she may start taking Lasix.  may take for up to 3 days.  Make sure every dose of Lasix that you take you also take a dose of potassium chloride.  This is very important.  Today we discussed referrals, orders. Echocardiogram, US venous.    I have placed these orders in the system for you.  Please be sure to give [place] a call if you have not heard from our office regarding this. We should hear from [place] within ONE week with information regarding your appointment. If not, please let me know immediately.   Labs and follow up next week. Peripheral Edema  Peripheral edema is swelling that is caused by a buildup of fluid. Peripheral edema most often affects the lower legs, ankles, and feet. It can also develop in the arms, hands, and face. The area of the body that has peripheral edema will look swollen. It may also feel heavy or warm. Your clothes may start to feel tight. Pressing on the area may make a temporary dent in your skin. You may not be able to move your arm or leg as much as usual. There are many causes of peripheral edema. It can be a complication of other diseases, such as congestive heart failure, kidney disease, or a problem with your blood circulation. It also can be a side effect of certain medicines. It often happens to women during pregnancy. Sometimes, the cause is not known. Treating the underlying condition is often the only treatment for peripheral edema. Follow these instructions at home: Pay attention to any changes in your symptoms. Take these actions to help with your discomfort:  Raise (elevate) your legs while you are sitting or lying down.  Move around often to prevent stiffness and to lessen swelling. Do not sit or stand for long periods of time.  Wear support stockings as told by your health care provider.  Follow instructions from your health care provider about limiting  salt (sodium) in your diet. Sometimes eating less salt can reduce swelling.  Take over-the-counter and prescription medicines only as told by your health care provider. Your health care provider may prescribe medicine to help your body get rid of excess water (diuretic).  Keep all follow-up visits as told by your health care provider. This is important. Contact a health care provider if:  You have a fever.  Your edema starts suddenly or is getting worse, especially if you are pregnant or have a medical condition.  You have swelling in only one leg.  You have increased swelling and pain in your legs. Get help right away if:  You develop shortness of breath, especially when you are lying down.  You have pain in your chest or abdomen.  You feel weak.  You faint. This information is not intended to replace advice given to you by your health care provider. Make sure you discuss any questions you have with your health care provider. Document Released: 12/08/2004 Document Revised: 04/04/2016 Document Reviewed: 05/13/2015 Elsevier Interactive Patient Education  2019 [employer].

## 2019-04-18 ENCOUNTER — Telehealth: Payer: Self-pay | Admitting: *Deleted

## 2019-04-18 NOTE — Telephone Encounter (Signed)
Copied from [place] [id number]. Topic: Quick Communication - See Telephone Encounter >> Apr 18, 2019  8:27 AM Loma [place] wrote: CRM for notification. See Telephone encounter for: 04/18/19. PT called in  wanting to talk to [place] had sent her to have a procedure this week and she ended up having to go to the ER. She had a lot of blood test and she now feels that she may not need to come in concerning her labwork on Monday 6/8. She would like a call back before 10:30 or after 2:00 pm today to confirm that is is okay to cancel her appt for labs on 6/8/. Please FU at [phone]

## 2019-04-18 NOTE — Telephone Encounter (Signed)
Pt said she just missed the call [phone]. Please call back she has the phone.

## 2019-04-19 ENCOUNTER — Telehealth: Payer: Self-pay | Admitting: Family

## 2019-04-19 NOTE — Telephone Encounter (Signed)
noted 

## 2019-04-19 NOTE — Telephone Encounter (Signed)
Advised patient of right lower DVT.  In the setting of her colon cancer, advised her to been seen in the emergency room particularly since today was a telephone visit only;  I felt uncomfortable starting medication without physically seeing patient.    She agreed

## 2019-04-22 ENCOUNTER — Encounter: Payer: Self-pay | Admitting: Family

## 2019-04-22 ENCOUNTER — Ambulatory Visit (INDEPENDENT_AMBULATORY_CARE_PROVIDER_SITE_OTHER): Payer: PPO | Admitting: Family

## 2019-04-22 ENCOUNTER — Other Ambulatory Visit: Payer: Self-pay

## 2019-04-22 ENCOUNTER — Other Ambulatory Visit: Payer: PPO

## 2019-04-22 DIAGNOSIS — M7989 Other specified soft tissue disorders: Secondary | ICD-10-CM

## 2019-04-22 DIAGNOSIS — I82441 Acute embolism and thrombosis of right tibial vein: Secondary | ICD-10-CM | POA: Diagnosis not present

## 2019-04-22 NOTE — Assessment & Plan Note (Signed)
Improved. Advised to STOP lasix/ KCl and pursue conservative management.  Labs tomorrow.

## 2019-04-22 NOTE — Assessment & Plan Note (Addendum)
Unprovoked DVT. H/o colon cancer.Will start xarelto tomorrow( she had dental extraction today). She will watch for any signs of bleeding. Will follow with Tasia Catchings to determine whether she stays on xarelto 3-6 months versus indefinitely. Will follow.

## 2019-04-22 NOTE — Progress Notes (Signed)
Verbal consent for services obtained from patient prior to services given.  Location of call:  provider at work patient at home  Names of all persons present for services: Mandy Paris, NP Chief complaint:   Leg swelling- completely down on tops of feet; slight swelling ankles. More swelling in left ankle, top of foot. No fever, sob, redness on legs.   Has taken lasix 4 days with Kcl.   Wearing compression stockings; elevating legs.   DVT right tibial calf.  Hasnt started xarelto due to dental appointment today in which she had tooth removed.  Plans to start tomorrow xarelto.  Lasix, kcl  BNP elevated 215  Seen in ed 04/17/19.   History, background, results pertinent:  No CKD.  No DVT.   US showed occlusive DVT right posterior tibial veins A/P/next steps: Problem List Items Addressed This Visit      Cardiovascular and Mediastinum   Acute deep vein thrombosis (DVT) of tibial vein of right lower extremity (HCC) - Primary    Unprovoked DVT. H/o colon cancer.Will start xarelto tomorrow( she had dental extraction today). She will watch for any signs of bleeding. Will follow with Tasia Catchings to determine whether she stays on xarelto 3-6 months versus indefinitely. Will follow.           Other   Leg swelling    Improved. Advised to STOP lasix/ KCl and pursue conservative management.  Labs tomorrow.           I spent 21 min  discussing plan of care over the phone.

## 2019-04-22 NOTE — Patient Instructions (Addendum)
Labs tomorrow.   Stop lasix, potassium chloride. Elevate legs, compression stockings.   Start Xarelto. Watch for bleeding- gums, nose, bruising and let me know.   Will discuss with Dr Tasia Catchings as well.     Let me know if any concerns!  Deep Vein Thrombosis  Deep vein thrombosis (DVT) is a condition in which a blood clot forms in a deep vein, such as a lower leg, thigh, or arm vein. A clot is blood that has thickened into a gel or solid. This condition is dangerous. It can lead to serious and even life-threatening complications if the clot travels to the lungs and causes a blockage (pulmonary embolism). It can also damage veins in the leg. This can result in leg pain, swelling, discoloration, and sores (post-thrombotic syndrome). What are the causes? This condition may be caused by:  A slowdown of blood flow.  Damage to a vein.  A condition that causes blood to clot more easily, such as an inherited clotting disorder. What increases the risk? The following factors may make you more likely to develop this condition:  Being overweight.  Being older, especially over age 63.  Sitting or lying down for more than four hours.  Being in the hospital.  Lack of physical activity (sedentary lifestyle).  Pregnancy, being in childbirth, or having recently given birth.  Taking medicines that contain estrogen, such as medicines to prevent pregnancy.  Smoking.  A history of any of the following: ? Blood clots or a blood clotting disease. ? Peripheral vascular disease. ? Inflammatory bowel disease. ? Cancer. ? Heart disease. ? Genetic conditions that affect how your blood clots, such as Factor V Leiden mutation. ? Neurological diseases that affect your legs (leg paresis). ? A recent injury, such as a car accident. ? Major or lengthy surgery. ? A central line placed inside a large vein. What are the signs or symptoms? Symptoms of this condition include:  Swelling, pain, or tenderness in  an arm or leg.  Warmth, redness, or discoloration in an arm or leg. If the clot is in your leg, symptoms may be more noticeable or worse when you stand or walk. Some people may not develop any symptoms. How is this diagnosed? This condition is diagnosed with:  A medical history and physical exam.  Tests, such as: ? Blood tests. These are done to check how well your blood clots. ? Ultrasound. This is done to check for clots. ? Venogram. For this test, contrast dye is injected into a vein and X-rays are taken to check for any clots. How is this treated? Treatment for this condition depends on:  The cause of your DVT.  Your risk for bleeding or developing more clots.  Any other medical conditions that you have. Treatment may include:  Taking a blood thinner (anticoagulant). This type of medicine prevents clots from forming. It may be taken by mouth, injected under the skin, or injected through an IV (catheter).  Injecting clot-dissolving medicines into the affected vein (catheter-directed thrombolysis).  Having surgery. Surgery may be done to: ? Remove the clot. ? Place a filter in a large vein to catch blood clots before they reach the lungs. Some treatments may be continued for up to six months. Follow these instructions at home: If you are taking blood thinners:  Take the medicine exactly as told by your health care provider. Some blood thinners need to be taken at the same time every day. Do not skip a dose.  Talk with your  health care provider before you take any medicines that contain aspirin or NSAIDs. These medicines increase your risk for dangerous bleeding.  Ask your health care provider about foods and drugs that could change the way the medicine works (may interact). Avoid those things if your health care provider tells you to do so.  Blood thinners can cause easy bruising and may make it difficult to stop bleeding. Because of this: ? Be very careful when using  knives, scissors, or other sharp objects. ? Use an electric razor instead of a blade. ? Avoid activities that could cause injury or bruising, and follow instructions about how to prevent falls.  Wear a medical alert bracelet or carry a card that lists what medicines you take. General instructions  Take over-the-counter and prescription medicines only as told by your health care provider.  Return to your normal activities as told by your health care provider. Ask your health care provider what activities are safe for you.  Wear compression stockings if recommended by your health care provider.  Keep all follow-up visits as told by your health care provider. This is important. How is this prevented? To lower your risk of developing this condition again:  For 30 or more minutes every day, do an activity that: ? Involves moving your arms and legs. ? Increases your heart rate.  When traveling for longer than four hours: ? Exercise your arms and legs every hour. ? Drink plenty of water. ? Avoid drinking alcohol.  Avoid sitting or lying for a long time without moving your legs.  If you have surgery or you are hospitalized, ask about ways to prevent blood clots. These may include taking frequent walks or using anticoagulants.  Stay at a healthy weight.  If you are a woman who is older than age 80, avoid unnecessary use of medicines that contain estrogen, such as some birth control pills.  Do not use any products that contain nicotine or tobacco, such as cigarettes and e-cigarettes. This is especially important if you take estrogen medicines. If you need help quitting, ask your health care provider. Contact a health care provider if:  You miss a dose of your blood thinner.  Your menstrual period is heavier than usual.  You have unusual bruising. Get help right away if:  You have: ? New or increased pain, swelling, or redness in an arm or leg. ? Numbness or tingling in an arm or  leg. ? Shortness of breath. ? Chest pain. ? A rapid or irregular heartbeat. ? A severe headache or confusion. ? A cut that will not stop bleeding.  There is blood in your vomit, stool, or urine.  You have a serious fall or accident, or you hit your head.  You feel light-headed or dizzy.  You cough up blood. These symptoms may represent a serious problem that is an emergency. Do not wait to see if the symptoms will go away. Get medical help right away. Call your local emergency services (911 in the [place] Do not drive yourself to the hospital. Summary  Deep vein thrombosis (DVT) is a condition in which a blood clot forms in a deep vein, such as a lower leg, thigh, or arm vein.  Symptoms can include swelling, warmth, pain, and redness in your leg or arm.  This condition may be treated with a blood thinner (anticoagulant medicine), medicine that is injected to dissolve blood clots,compression stockings, or surgery.  If you are prescribed blood thinners, take them exactly as told.  This information is not intended to replace advice given to you by your health care provider. Make sure you discuss any questions you have with your health care provider. Document Released: 10/31/2005 Document Revised: 03/31/2017 Document Reviewed: 03/31/2017 Elsevier Interactive Patient Education  2019 [employer] Rivaroxaban oral tablets What is this medicine? RIVAROXABAN (ri va ROX a ban) is an anticoagulant (blood thinner). It is used to treat blood clots in the lungs or in the veins. It is also used after knee or hip surgeries to prevent blood clots. It is also used to lower the chance of stroke in people with a medical condition called atrial fibrillation. This medicine may be used for other purposes; ask your health care provider or pharmacist if you have questions. COMMON BRAND NAME(S): Xarelto, Xarelto Starter Pack What should I tell my health care provider before I take this medicine? They need to  know if you have any of these conditions: -bleeding disorders -bleeding in the brain -blood in your stools (black or tarry stools) or if you have blood in your vomit -history of stomach bleeding -kidney disease -liver disease -low blood counts, like low white cell, platelet, or red cell counts -recent or planned spinal or epidural procedure -take medicines that treat or prevent blood clots -an unusual or allergic reaction to rivaroxaban, other medicines, foods, dyes, or preservatives -pregnant or trying to get pregnant -breast-feeding How should I use this medicine? Take this medicine by mouth with a glass of water. Follow the directions on the prescription label. Take your medicine at regular intervals. Do not take it more often than directed. Do not stop taking except on your doctor's advice. Stopping this medicine may increase your risk of a blood clot. Be sure to refill your prescription before you run out of medicine. If you are taking this medicine after hip or knee replacement surgery, take it with or without food. If you are taking this medicine for atrial fibrillation, take it with your evening meal. If you are taking this medicine to treat blood clots, take it with food at the same time each day. If you are unable to swallow your tablet, you may crush the tablet and mix it in applesauce. Then, immediately eat the applesauce. You should eat more food right after you eat the applesauce containing the crushed tablet. Talk to your pediatrician regarding the use of this medicine in children. Special care may be needed. Overdosage: If you think you have taken too much of this medicine contact a poison control center or emergency room at once. NOTE: This medicine is only for you. Do not share this medicine with others. What if I miss a dose? If you take your medicine once a day and miss a dose, take the missed dose as soon as you remember. If it is almost time for your next dose, take only  that dose. Do not take double or extra doses. If you take your medicine twice a day and miss a dose, take the missed dose immediately. In this instance, 2 tablets may be taken at the same time. The next day you should take 1 tablet twice a day as directed. What may interact with this medicine? Do not take this medicine with any of the following medications: -defibrotide This medicine may also interact with the following medications: -aspirin and aspirin-like medicines -certain antibiotics like erythromycin, azithromycin, and clarithromycin -certain medicines for fungal infections like ketoconazole and itraconazole -certain medicines for irregular heart beat like amiodarone, quinidine, dronedarone -certain medicines  for seizures like carbamazepine, phenytoin -certain medicines that treat or prevent blood clots like warfarin, enoxaparin, and dalteparin -conivaptan -felodipine -indinavir -lopinavir; ritonavir -NSAIDS, medicines for pain and inflammation, like ibuprofen or naproxen -ranolazine -rifampin -ritonavir -SNRIs, medicines for depression, like desvenlafaxine, duloxetine, levomilnacipran, venlafaxine -SSRIs, medicines for depression, like citalopram, escitalopram, fluoxetine, fluvoxamine, paroxetine, sertraline -St. John's wort -verapamil This list may not describe all possible interactions. Give your health care provider a list of all the medicines, herbs, non-prescription drugs, or dietary supplements you use. Also tell them if you smoke, drink alcohol, or use illegal drugs. Some items may interact with your medicine. What should I watch for while using this medicine? Visit your healthcare professional for regular checks on your progress. You may need blood work done while you are taking this medicine. Your condition will be monitored carefully while you are receiving this medicine. It is important not to miss any appointments. Avoid sports and activities that might cause injury  while you are using this medicine. Severe falls or injuries can cause unseen bleeding. Be careful when using sharp tools or knives. Consider using an Copy. Take special care brushing or flossing your teeth. Report any injuries, bruising, or red spots on the skin to your healthcare professional. If you are going to need surgery or other procedure, tell your healthcare professional that you are taking this medicine. Wear a medical ID bracelet or chain. Carry a card that describes your disease and details of your medicine and dosage times. What side effects may I notice from receiving this medicine? Side effects that you should report to your doctor or health care professional as soon as possible: -allergic reactions like skin rash, itching or hives, swelling of the face, lips, or tongue -back pain -redness, blistering, peeling or loosening of the skin, including inside the mouth -signs and symptoms of bleeding such as bloody or black, tarry stools; red or dark-brown urine; spitting up blood or brown material that looks like coffee grounds; red spots on the skin; unusual bruising or bleeding from the eye, gums, or nose -signs and symptoms of a blood clot such as chest pain; shortness of breath; pain, swelling, or warmth in the leg -signs and symptoms of a stroke such as changes in vision; confusion; trouble speaking or understanding; severe headaches; sudden numbness or weakness of the face, arm or leg; trouble walking; dizziness; loss of coordination Side effects that usually do not require medical attention (report to your doctor or health care professional if they continue or are bothersome): -dizziness -muscle pain This list may not describe all possible side effects. Call your doctor for medical advice about side effects. You may report side effects to FDA at 1-800-FDA-1088. Where should I keep my medicine? Keep out of the reach of children. Store at room temperature between 15 and 30  degrees C (59 and 86 degrees F). Throw away any unused medicine after the expiration date. NOTE: This sheet is a summary. It may not cover all possible information. If you have questions about this medicine, talk to your doctor, pharmacist, or health care provider.  2019 Elsevier/[employer] Standard (2017-10-26 11:37:12)

## 2019-04-23 ENCOUNTER — Other Ambulatory Visit (INDEPENDENT_AMBULATORY_CARE_PROVIDER_SITE_OTHER): Payer: PPO

## 2019-04-23 ENCOUNTER — Other Ambulatory Visit: Payer: Self-pay

## 2019-04-23 DIAGNOSIS — M7989 Other specified soft tissue disorders: Secondary | ICD-10-CM | POA: Diagnosis not present

## 2019-04-23 NOTE — Progress Notes (Signed)
She is scheduled this Friday June 12. Thx, melissa

## 2019-04-24 ENCOUNTER — Ambulatory Visit: Payer: PPO | Admitting: Family

## 2019-04-24 LAB — BASIC METABOLIC PANEL
BUN: 14 mg/dL (ref 6–23)
CO2: 24 mEq/L (ref 19–32)
Calcium: 9.1 mg/dL (ref 8.4–10.5)
Chloride: 109 mEq/L (ref 96–112)
Creatinine, Ser: 0.92 mg/dL (ref 0.40–1.20)
GFR: 59.04 mL/min — ABNORMAL LOW (ref >60.00)
Glucose, Bld: 92 mg/dL (ref 70–99)
Potassium: 3.9 mEq/L (ref 3.5–5.1)
Sodium: 141 mEq/L (ref 135–145)

## 2019-04-26 ENCOUNTER — Ambulatory Visit
Admission: RE | Admit: 2019-04-26 | Discharge: 2019-04-26 | Disposition: A | Discharge status: Home / Self Care | Payer: PPO | Source: Ambulatory Visit | Attending: Family | Admitting: Family

## 2019-04-26 ENCOUNTER — Other Ambulatory Visit: Payer: Self-pay

## 2019-04-26 DIAGNOSIS — I10 Essential (primary) hypertension: Secondary | ICD-10-CM | POA: Insufficient documentation

## 2019-04-26 DIAGNOSIS — C541 Malignant neoplasm of endometrium: Secondary | ICD-10-CM | POA: Insufficient documentation

## 2019-04-26 DIAGNOSIS — C189 Malignant neoplasm of colon, unspecified: Secondary | ICD-10-CM | POA: Insufficient documentation

## 2019-04-26 DIAGNOSIS — R609 Edema, unspecified: Secondary | ICD-10-CM | POA: Diagnosis not present

## 2019-04-26 DIAGNOSIS — M7989 Other specified soft tissue disorders: Secondary | ICD-10-CM | POA: Diagnosis not present

## 2019-04-26 NOTE — Progress Notes (Signed)
*  PRELIMINARY RESULTS* Echocardiogram 2D Echocardiogram has been performed.  [name] 04/26/2019, 10:32 AM

## 2019-04-30 ENCOUNTER — Inpatient Hospital Stay: Payer: PPO | Attending: Oncology

## 2019-04-30 ENCOUNTER — Other Ambulatory Visit: Payer: Self-pay

## 2019-04-30 DIAGNOSIS — R911 Solitary pulmonary nodule: Secondary | ICD-10-CM | POA: Insufficient documentation

## 2019-04-30 DIAGNOSIS — Z85038 Personal history of other malignant neoplasm of large intestine: Secondary | ICD-10-CM | POA: Diagnosis not present

## 2019-04-30 DIAGNOSIS — Z95828 Presence of other vascular implants and grafts: Secondary | ICD-10-CM | POA: Diagnosis not present

## 2019-04-30 LAB — COMPREHENSIVE METABOLIC PANEL
ALT: 18 U/L (ref 0–44)
AST: 23 U/L (ref 15–41)
Albumin: 3.8 g/dL (ref 3.5–5.0)
Alkaline Phosphatase: 108 U/L (ref 38–126)
Anion gap: 7 (ref 5–15)
BUN: 19 mg/dL (ref 8–23)
CO2: 22 mmol/L (ref 22–32)
Calcium: 9.1 mg/dL (ref 8.9–10.3)
Chloride: 110 mmol/L (ref 98–111)
Creatinine, Ser: 0.92 mg/dL (ref 0.44–1.00)
GFR calc Af Amer: >60 mL/min (ref >60)
GFR calc non Af Amer: 60 mL/min — ABNORMAL LOW (ref >60)
Glucose, Bld: 146 mg/dL — ABNORMAL HIGH (ref 70–99)
Potassium: 3.8 mmol/L (ref 3.5–5.1)
Sodium: 139 mmol/L (ref 135–145)
Total Bilirubin: 0.5 mg/dL (ref 0.3–1.2)
Total Protein: 7.1 g/dL (ref 6.5–8.1)

## 2019-04-30 LAB — CBC WITH DIFFERENTIAL/PLATELET
Abs Immature Granulocytes: 0.02 10*3/uL (ref 0.00–0.07)
Basophils Absolute: 0 10*3/uL (ref 0.0–0.1)
Basophils Relative: 1 %
Eosinophils Absolute: 0.1 10*3/uL (ref 0.0–0.5)
Eosinophils Relative: 3 %
HCT: 39.3 % (ref 36.0–46.0)
Hemoglobin: 12.5 g/dL (ref 12.0–15.0)
Immature Granulocytes: 1 %
Lymphocytes Relative: 23 %
Lymphs Abs: 1 10*3/uL (ref 0.7–4.0)
MCH: 28.6 pg (ref 26.0–34.0)
MCHC: 31.8 g/dL (ref 30.0–36.0)
MCV: 89.9 fL (ref 80.0–100.0)
Monocytes Absolute: 0.4 10*3/uL (ref 0.1–1.0)
Monocytes Relative: 8 %
Neutro Abs: 2.9 10*3/uL (ref 1.7–7.7)
Neutrophils Relative %: 64 %
Platelets: 215 10*3/uL (ref 150–400)
RBC: 4.37 MIL/uL (ref 3.87–5.11)
RDW: 14 % (ref 11.5–15.5)
WBC: 4.4 10*3/uL (ref 4.0–10.5)
nRBC: 0 % (ref 0.0–0.2)

## 2019-04-30 MED ORDER — SODIUM CHLORIDE 0.9% FLUSH
10.0000 mL | Freq: Once | INTRAVENOUS | Status: DC
  Filled 2019-04-30: qty 10

## 2019-04-30 MED ORDER — HEPARIN SOD (PORK) LOCK FLUSH 100 UNIT/ML IV SOLN
INTRAVENOUS | Status: AC
  Filled 2019-04-30: qty 5

## 2019-04-30 MED ORDER — HEPARIN SOD (PORK) LOCK FLUSH 100 UNIT/ML IV SOLN: 500.0000 [IU] | Freq: Once | INTRAVENOUS | Status: DC

## 2019-05-03 ENCOUNTER — Telehealth: Payer: Self-pay | Admitting: Family

## 2019-05-03 ENCOUNTER — Other Ambulatory Visit: Payer: PPO

## 2019-05-03 NOTE — Telephone Encounter (Signed)
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

## 2019-05-08 ENCOUNTER — Encounter: Payer: Self-pay | Admitting: Family

## 2019-05-08 NOTE — Telephone Encounter (Signed)
noted 

## 2019-05-13 ENCOUNTER — Telehealth: Payer: Self-pay

## 2019-05-13 ENCOUNTER — Other Ambulatory Visit: Payer: Self-pay | Admitting: Family

## 2019-05-13 ENCOUNTER — Other Ambulatory Visit: Payer: Self-pay

## 2019-05-13 DIAGNOSIS — M7989 Other specified soft tissue disorders: Secondary | ICD-10-CM

## 2019-05-13 DIAGNOSIS — I82441 Acute embolism and thrombosis of right tibial vein: Secondary | ICD-10-CM

## 2019-05-13 MED ORDER — RIVAROXABAN 20 MG PO TABS: 20.0000 mg | ORAL_TABLET | Freq: Every day | ORAL | 1 refills | Status: DC

## 2019-05-13 NOTE — Telephone Encounter (Signed)
Call patient She will at least need to be on Xarelto 20 mg daily for 3 months.  I went ahead and refilled her prescription.  She Has appointment with oncology coming up in July.  She will need to discuss with Dr. Tasia Catchings if she will need to be on Xarelto greater than 3 months.

## 2019-05-13 NOTE — Telephone Encounter (Signed)
Copied from [place] [id number]. Topic: General - Other >> May 13, 2019  9:18 AM Rainey Pines A wrote: Patient would like to speak with Judson Roch in regards to her medication and would like a callback.

## 2019-05-13 NOTE — Telephone Encounter (Signed)
I spoke with patient & advised on below. Patient verbalized understanding.

## 2019-05-14 ENCOUNTER — Inpatient Hospital Stay: Payer: PPO | Admitting: Oncology

## 2019-05-14 NOTE — Telephone Encounter (Signed)
Pt called stating she has a question regarding this medication. Please advise.

## 2019-05-15 NOTE — Telephone Encounter (Signed)
I spoke with patient & she wanted to make sure that she was only supposed to take 20mg  of Xarelto once a day with supper. I verified this & she has an appointment with Dr. Tasia Catchings 05/30/19.

## 2019-05-17 ENCOUNTER — Encounter: Payer: Self-pay | Admitting: Family

## 2019-05-20 ENCOUNTER — Other Ambulatory Visit: Payer: Self-pay | Admitting: Family

## 2019-05-20 DIAGNOSIS — I82441 Acute embolism and thrombosis of right tibial vein: Secondary | ICD-10-CM

## 2019-05-21 ENCOUNTER — Inpatient Hospital Stay: Payer: PPO

## 2019-05-21 ENCOUNTER — Encounter: Payer: Self-pay | Admitting: Oncology

## 2019-05-21 ENCOUNTER — Other Ambulatory Visit: Payer: Self-pay

## 2019-05-21 ENCOUNTER — Inpatient Hospital Stay: Payer: PPO | Attending: Oncology | Admitting: Oncology

## 2019-05-21 VITALS — BP 136/74 | HR 69 | Temp 95.9°F | Resp 18 | Wt 222.9 lb

## 2019-05-21 DIAGNOSIS — I82441 Acute embolism and thrombosis of right tibial vein: Secondary | ICD-10-CM | POA: Diagnosis not present

## 2019-05-21 DIAGNOSIS — Z85038 Personal history of other malignant neoplasm of large intestine: Secondary | ICD-10-CM | POA: Insufficient documentation

## 2019-05-21 DIAGNOSIS — R600 Localized edema: Secondary | ICD-10-CM | POA: Diagnosis not present

## 2019-05-21 DIAGNOSIS — Z79899 Other long term (current) drug therapy: Secondary | ICD-10-CM | POA: Diagnosis not present

## 2019-05-21 DIAGNOSIS — Z7901 Long term (current) use of anticoagulants: Secondary | ICD-10-CM | POA: Diagnosis not present

## 2019-05-21 DIAGNOSIS — Z9221 Personal history of antineoplastic chemotherapy: Secondary | ICD-10-CM | POA: Diagnosis not present

## 2019-05-21 DIAGNOSIS — Z95828 Presence of other vascular implants and grafts: Secondary | ICD-10-CM

## 2019-05-21 LAB — COMPREHENSIVE METABOLIC PANEL
ALT: 16 U/L (ref 0–44)
AST: 23 U/L (ref 15–41)
Albumin: 3.7 g/dL (ref 3.5–5.0)
Alkaline Phosphatase: 103 U/L (ref 38–126)
Anion gap: 8 (ref 5–15)
BUN: 17 mg/dL (ref 8–23)
CO2: 25 mmol/L (ref 22–32)
Calcium: 9.2 mg/dL (ref 8.9–10.3)
Chloride: 108 mmol/L (ref 98–111)
Creatinine, Ser: 1.02 mg/dL — ABNORMAL HIGH (ref 0.44–1.00)
GFR calc Af Amer: >60 mL/min (ref >60)
GFR calc non Af Amer: 53 mL/min — ABNORMAL LOW (ref >60)
Glucose, Bld: 131 mg/dL — ABNORMAL HIGH (ref 70–99)
Potassium: 4.2 mmol/L (ref 3.5–5.1)
Sodium: 141 mmol/L (ref 135–145)
Total Bilirubin: 0.6 mg/dL (ref 0.3–1.2)
Total Protein: 6.8 g/dL (ref 6.5–8.1)

## 2019-05-21 LAB — CBC WITH DIFFERENTIAL/PLATELET
Abs Immature Granulocytes: 0 10*3/uL (ref 0.00–0.07)
Basophils Absolute: 0 10*3/uL (ref 0.0–0.1)
Basophils Relative: 1 %
Eosinophils Absolute: 0.1 10*3/uL (ref 0.0–0.5)
Eosinophils Relative: 2 %
HCT: 38.2 % (ref 36.0–46.0)
Hemoglobin: 12.1 g/dL (ref 12.0–15.0)
Immature Granulocytes: 0 %
Lymphocytes Relative: 28 %
Lymphs Abs: 1.3 10*3/uL (ref 0.7–4.0)
MCH: 28.7 pg (ref 26.0–34.0)
MCHC: 31.7 g/dL (ref 30.0–36.0)
MCV: 90.7 fL (ref 80.0–100.0)
Monocytes Absolute: 0.4 10*3/uL (ref 0.1–1.0)
Monocytes Relative: 9 %
Neutro Abs: 2.7 10*3/uL (ref 1.7–7.7)
Neutrophils Relative %: 60 %
Platelets: 203 10*3/uL (ref 150–400)
RBC: 4.21 MIL/uL (ref 3.87–5.11)
RDW: 14.2 % (ref 11.5–15.5)
WBC: 4.5 10*3/uL (ref 4.0–10.5)
nRBC: 0 % (ref 0.0–0.2)

## 2019-05-21 NOTE — Progress Notes (Signed)
Patient here for follow up. No concerns voiced.  °

## 2019-05-22 ENCOUNTER — Encounter: Payer: Self-pay | Admitting: Oncology

## 2019-05-22 ENCOUNTER — Encounter: Payer: Self-pay | Admitting: Family

## 2019-05-22 LAB — CEA: CEA: 1.4 ng/mL (ref 0.0–4.7)

## 2019-05-23 NOTE — Progress Notes (Signed)
Hematology/Oncology Follow Up visit. [place] [phone] [phone]  CONSULT NOTE Patient Care Team: Burnard Hawthorne, FNP as PCP - General (Family Medicine) Vladimir Crofts, MD (Neurology) Noreene Filbert, MD as Referring Physician (Radiation Oncology) Leighton Ruff, MD as Consulting Physician (General Surgery) Earlie Server, MD as Consulting Physician (Oncology) Jules Husbands, MD as Consulting Physician (General Surgery)  PURPOSE OF CONSULTATION:  Follow up for blood clot, and stage IIIB colon cancer  HISTORY OF PRESENTING ILLNESS:  [name] 78 y.o.  female with PMH listed as below was referred to me for evaluation and management of colon cancer.  She had colonoscopy evaluation due to positive cologuard.  She was found to have a nonobstructing mass approximately 20 cm from the anal verge which biopsy proved to be adenocarcinoma. CT scan showed a large mass from the sigmoid and extends beyond the wall of the colon toward the left into the surrounding fat. Preoperational CEA was normal. Patient underwent robotic sigmoidectomy on 06/07/2017.   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 MLH1  loss of function, PMS2 loss of function. Declined option of clinical trial ATOMIC She has a history of endometrial cancer s/p hysterotomy and radiation. She has a family history of colon cancer and breast cancer.  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 Results have been explained to patient by genetic counselor.   Oncology treatment S/p adjuvant chemotherapy finished February 2019 Adjuvant RT finished in April 2019.  05/22/2018 Colonoscopy surveillance by Dr.Wohl showed 10 mm polyp in ascending colon.  Removed.  Patent end-to-end colo-colonic anastomosis Pathology showed  benign polyp.  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   INTERVAL HISTORY 78 yo female with above history presents for follow up per primary care provider for evaluation of blood clots.  History of Stage IIIB colon cancer She reports progressively worsened bilateral lower extremity swelling, left >right and was seen by PCP recently.  She was started on Lasix with potassium chloride supplementation.  Work up as below,  Bilateral lower extremities Venous Duplex showed age indeterminate occlusive DVT of right posterior tibial veins. Left LE negative for thrombosis.  She has been started on Xarelto, tolerating well. No bleeding events.  04/26/2019 Echocardiogram, showed LVEF 60-65%. Mild increased LV wall thickness, impaired relaxation.  BNP was elevated.  Patient denies any recent injury, trauma, car or flight trip, or other immobilization factors. No previous thrombosis history.  Denies any shortness of breath, chest pain.  Patient reports that her bilateral lower extremity swelling improved after taking Lasix, improvement was even before starting Xarelto.  She also uses compression stocking.  She did not pay attention to if her swelling gets better with leg elevation or not.  Review of Systems  Constitutional: Negative for appetite change, chills, fatigue and fever.  HENT:   Negative for hearing loss and voice change.   Eyes: Negative for eye problems.  Respiratory: Negative for chest tightness and cough.   Cardiovascular: Positive for leg swelling. Negative for chest pain.  Gastrointestinal: Negative for abdominal distention, abdominal pain, blood in stool and diarrhea.  Endocrine: Negative for hot flashes.  Genitourinary: Negative for difficulty urinating and frequency.   Musculoskeletal: Negative for arthralgias.  Skin: Negative for itching and rash.  Neurological: Negative for extremity weakness.  Hematological: Negative for adenopathy.  Psychiatric/Behavioral: Negative for  confusion.    MEDICAL HISTORY:  Past Medical History:  Diagnosis Date  . Arthritis    bitateral knees and left foot,s/p cortisone injection in past  . Chronic constipation   . Colon cancer ([place]) 2018   pt is undergoing chemo 11-09-17  . Complication of anesthesia     slow to wake up   . Endometrial cancer ([place]) 2014   Total Hysterectomy and Rad tx's.   . Family history of cancer   . Genetic testing 09/26/2017    Multi-Cancer panel (83 genes) @ [facility] - No pathogenic mutations detected  . GERD (gastroesophageal reflux disease)   . Hypertension   . IBS (irritable bowel syndrome)   . Macular degeneration of both eyes   . Menopause    age 27  . PONV (postoperative nausea and vomiting)   . Vitamin D deficiency 06/07/10    SURGICAL HISTORY: Past Surgical History:  Procedure Laterality Date  . BUNIONECTOMY    . CATARACT EXTRACTION W/PHACO Left 09/24/2018   Procedure: CATARACT EXTRACTION PHACO AND INTRAOCULAR LENS PLACEMENT ([place]) LEFT;  Surgeon: Eulogio Bear, MD;  Location: [facility];  Service: Ophthalmology;  Laterality: Left;  . CATARACT EXTRACTION W/PHACO Right 10/15/2018   Procedure: CATARACT EXTRACTION PHACO AND INTRAOCULAR LENS PLACEMENT ([place]) RIGHT;  Surgeon: Eulogio Bear, MD;  Location: [facility];  Service: Ophthalmology;  Laterality: Right;  . cataract surgery     both eyes  . COLONOSCOPY WITH PROPOFOL N/A 04/04/2017   Procedure: COLONOSCOPY WITH PROPOFOL;  Surgeon: [name], MD;  Location[facility] ENDOSCOPY;  Service: Endoscopy;  Laterality: N/A;  . COLONOSCOPY WITH PROPOFOL N/A 05/22/2018   Procedure: COLONOSCOPY WITH PROPOFOL;  Surgeon: [name], MD;  Location: [facility] ENDOSCOPY;  Service: Endoscopy;  Laterality: N/A;  . DILATION AND CURETTAGE OF UTERUS  1971  . FOOT SURGERY    . NASAL SINUS SURGERY    . PORTACATH PLACEMENT Right 07/12/2017   Procedure: INSERTION PORT-A-CATH;  Surgeon: Jules Husbands, MD;  Location: [facility] ORS;  Service: General;   Laterality: Right;  . ROBOTIC ASSISTED LAPAROSCOPIC VENTRAL/INCISIONAL HERNIA REPAIR N/A 06/07/2017   Procedure: ROBOTIC INCISIONAL HERNIA REPAIR;  Surgeon: Leighton Ruff, MD;  Location: [facility];  Service: General;  Laterality: N/A;  . VAGINAL DELIVERY    . VAGINAL HYSTERECTOMY      SOCIAL HISTORY: Social History   Socioeconomic History  . Marital status: Divorced    Spouse name: Not on file  . Number of children: Not on file  . Years of education: Not on file  . Highest education level: Not on file  Occupational History  . Not on file  Social Needs  . Financial resource strain: Not hard at all  . Food insecurity    Worry: Never true    Inability: Never true  . Transportation needs    Medical: No    Non-medical: No  Tobacco Use  . Smoking status: Never Smoker  . Smokeless tobacco: Never Used  Substance and Sexual Activity  . Alcohol use: Yes    Comment: rarely - Holidays  . Drug use: No  . Sexual activity: Never  Lifestyle  . Physical activity    Days per week: Not on file    Minutes per session: Not on file  . Stress: Not at all  Relationships  . Social Herbalist on phone: Not on file    Gets together: Not on file    Attends religious service: Not on file  Active member of club or organization: Not on file    Attends meetings of clubs or organizations: Not on file    Relationship status: Not on file  . Intimate partner violence    Fear of current or ex partner: Not on file    Emotionally abused: Not on file    Physically abused: Not on file    Forced sexual activity: Not on file  Other Topics Concern  . Not on file  Social History Narrative   Exercise- aerobic exercise at home 3-4x week.       Lives in [place]. No pets.    FAMILY HISTORY: Family History  Problem Relation Age of Onset  . Heart disease Father 89       massive MI  . Diabetes Father   . Bipolar disorder Sister   . Osteoporosis Sister   . Breast cancer Sister 32        currently 26  . Cancer Sister        breast  . Bipolar disorder Maternal Aunt   . Breast cancer Maternal Aunt        age at dx unknown; deceased 93s  . Colon cancer Maternal Grandfather 8       deceased 36  . Cancer Maternal Grandfather        breast  . Osteoporosis Mother   . Colon cancer Mother        age at dx unknown; deceased 66  . Cancer Maternal Grandmother        unk. type; deceased 70  . Breast cancer Paternal Aunt        deceased 2    ALLERGIES:  is allergic to codeine.  MEDICATIONS:  Current Outpatient Medications  Medication Sig Dispense Refill  . Docusate Calcium (STOOL SOFTENER PO) Take by mouth daily as needed.    . fluticasone (FLONASE) 50 MCG/ACT nasal spray Place 2 sprays into both nostrils daily. (Patient taking differently: Place 2 sprays into both nostrils daily as needed. ) 16 g 2  . losartan (COZAAR) 50 MG tablet Take 1 tablet (50 mg total) by mouth daily. 90 tablet 3  . metoprolol succinate (TOPROL-XL) 100 MG 24 hr tablet TAKE 1 TABLET DAILY 90 tablet 3  . Multiple Vitamin (MULTIVITAMIN WITH MINERALS) TABS tablet Take 1 tablet by mouth daily.    . Multiple Vitamins-Minerals (ICAPS AREDS 2 PO) Take 2 capsules by mouth daily.    . phenylephrine (SUDAFED PE) 10 MG TABS tablet Take 10 mg by mouth every 4 (four) hours as needed.    . rivaroxaban (XARELTO) 20 MG TABS tablet Take 1 tablet (20 mg total) by mouth daily with supper. 30 tablet 1  . topiramate (TOPAMAX) 50 MG tablet TAKE 1 TABLET TWICE DAILY 180 tablet 0  . furosemide (LASIX) 20 MG tablet Take 1 tablet (20 mg total) by mouth daily for 7 days. 30 tablet 1  . potassium chloride (K-DUR) 10 MEQ tablet Take 1 tablet (10 mEq total) by mouth daily for 3 days. With lasix. 30 tablet 1  . topiramate (TOPAMAX) 25 MG tablet Take by mouth.     No current facility-administered medications for this visit.       Marland Kitchen  PHYSICAL EXAMINATION: ECOG PERFORMANCE STATUS: 0 - Asymptomatic Vitals:   05/21/19 1436   BP: 136/74  Pulse: 69  Resp: 18  Temp: (!) 95.9 F (35.5 C)   Filed Weights   05/21/19 1436  Weight: 222 lb 14.4 oz (101.1 kg)  Physical Exam Constitutional:      General: She is not in acute distress.    Appearance: She is well-developed.  HENT:     Head: Normocephalic and atraumatic.     Mouth/Throat:     Pharynx: No oropharyngeal exudate.  Eyes:     General: No scleral icterus.    Conjunctiva/sclera: Conjunctivae normal.     Pupils: Pupils are equal, round, and reactive to light.  Neck:     Musculoskeletal: Normal range of motion and neck supple.  Cardiovascular:     Rate and Rhythm: Normal rate and regular rhythm.     Heart sounds: Normal heart sounds. No murmur.  Pulmonary:     Effort: Pulmonary effort is normal. No respiratory distress.     Breath sounds: Normal breath sounds. No stridor. No wheezing or rales.  Chest:     Chest wall: No tenderness.  Abdominal:     General: Bowel sounds are normal. There is no distension.     Palpations: Abdomen is soft. There is no mass.     Tenderness: There is no abdominal tenderness. There is no guarding.  Musculoskeletal: Normal range of motion.        General: Swelling present. No deformity.     Comments: Bilateral edema,1+, compression stocking  Lymphadenopathy:     Cervical: No cervical adenopathy.  Skin:    General: Skin is warm and dry.     Findings: No erythema or rash.     Comments: Chest wall port.   Neurological:     Mental Status: She is alert and oriented to person, place, and time.     Cranial Nerves: No cranial nerve deficit.     Coordination: Coordination normal.  Psychiatric:        Behavior: Behavior normal.        Thought Content: Thought content normal.     LABORATORY DATA:  I have reviewed the data as listed Lab Results  Component Value Date   WBC 4.5 05/21/2019   HGB 12.1 05/21/2019   HCT 38.2 05/21/2019   MCV 90.7 05/21/2019   PLT 203 05/21/2019   Recent Labs    04/17/19 1942 04/23/19  1447 04/30/19 1125 05/21/19 1525  NA 141 141 139 141  K 3.9 3.9 3.8 4.2  CL 111 109 110 108  CO2 21* _0 GLUCOSE 105* 92 146* 131*  BUN _1 CREATININE 0.99 0.92 0.92 1.02*  CALCIUM 9.2 9.1 9.1 9.2  GFRNONAA 55*  --  60* 53*  GFRAA >60  --  >60 >60  PROT 7.1  --  7.1 6.8  ALBUMIN 3.8  --  3.8 3.7  AST 23  --  23 23  ALT 16  --  18 16  ALKPHOS 111  --  108 103  BILITOT 0.8  --  0.5 0.6    RADIOGRAPHIC STUDIES: I have personally reviewed the radiological images as listed and agreed with the findings in the report. 04/12/2017 CT abdomen pelvis w contrast Large mass arising from the sigmoid colon. This mass extends beyond the wall of the colon toward the left into the surrounding fat. This lesion does not reach the left pelvic sidewall.No adenopathy or liver lesions evident. No omental lesions appreciable. Sizable midline ventral hernia slightly superior to theumbilicus containing fat and a loop of colon without bowel compromise. Much smaller midline umbilical ventral hernia containingonly fat. There is a small hiatal hernia. No bowel obstruction.  No abscess.  Appendix  region appears normal. No renal or ureteral calculus.  No hydronephrosis.There is aortoiliac atherosclerosis. There are foci of coronary artery calcification.  RADIOGRAPHIC STUDIES: I have personally reviewed the radiological images as listed and agreed with the findings in the report. 07/31/2018  No evidence of recurrent or metastatic carcinoma, or other significant abnormality.  Aortic and coronary artery atherosclerosis incidentally noted  01/24/2019 CT chest abdomen pelvis with contrast showed a stable exam.  No evidence of recurrent or metastatic carcinoma within the chest, abdomen, or pelvis.  Aortic coronary artery atherosclerosis. Stable 4 mm pulmonary nodule  ASSESSMENT & PLAN:  78 y.o.female who has Stage IIIB colon cancer.  Cancer Staging Cancer of descending colon [facility]) Staging form:  Colon and Rectum, AJCC 8th Edition - Pathologic: Stage IIIB (pT4a, pN1, cM0) - Signed by Earlie Server, MD on 04/30/2018  1. History of colon cancer   2. Deep vein thrombosis (DVT) of tibial vein of right lower extremity, unspecified chronicity ([place])   3. Bilateral leg edema   4. Port-A-Cath in place    # right below knee age undetermined DVT,  Started on Xalreto, currently on 104m daily. Tolerating without bleeding events.  Continue to finish 3 months given that she is symptomatic with edema.  Repeat UKoreavenous duple RLE.  Bilateral LE edema, this is a new problem for her.  left LE no evidence thrombosis. Her clinic picture suggests a more systemic cause of her edema. ECHO showed normal systolic function and relaxation dysfunction. Elevated BNP Recommend cardiology work up  Recommend checking urine protein, looking to renal etiology.   Stage IIIB colon cancer, check CEA.  CT chest abdomen pelvis 3 months ago showed no evidence of recurrence.  Cancer recurrence is in the differential diagnosis.  repeat CT in September.   Port-A-Cath continue port flush every 6- 8 weeks.  Will discuss about port removal after July 2020.  Recommendation was sent to patient's PCP via Epic inbasket message.   Orders Placed This Encounter  Procedures  . CBC with Differential/Platelet    Standing Status:   Future    Standing Expiration Date:   05/20/2020  . Comprehensive metabolic panel    Standing Status:   Future    Standing Expiration Date:   05/20/2020  . CEA    Standing Status:   Future    Standing Expiration Date:   05/20/2020  . Comprehensive metabolic panel    Standing Status:   Future    Number of Occurrences:   1    Standing Expiration Date:   05/20/2020  . CBC with Differential/Platelet    Standing Status:   Future    Number of Occurrences:   1    Standing Expiration Date:   05/20/2020  . CEA    Standing Status:   Future    Number of Occurrences:   1    Standing Expiration Date:   05/20/2020     Return of visit: as previously scheduled.    ZEarlie Server MD, PhD Hematology Oncology [facility] AA[facility] ClinicPager- [phone]/07/2019

## 2019-05-24 ENCOUNTER — Other Ambulatory Visit: Payer: Self-pay | Admitting: Family

## 2019-05-24 ENCOUNTER — Other Ambulatory Visit: Payer: Self-pay

## 2019-05-24 ENCOUNTER — Other Ambulatory Visit (INDEPENDENT_AMBULATORY_CARE_PROVIDER_SITE_OTHER): Payer: PPO

## 2019-05-24 ENCOUNTER — Telehealth: Payer: Self-pay | Admitting: Family

## 2019-05-24 ENCOUNTER — Encounter: Payer: Self-pay | Admitting: Family

## 2019-05-24 DIAGNOSIS — M7989 Other specified soft tissue disorders: Secondary | ICD-10-CM | POA: Diagnosis not present

## 2019-05-24 DIAGNOSIS — I824Y1 Acute embolism and thrombosis of unspecified deep veins of right proximal lower extremity: Secondary | ICD-10-CM

## 2019-05-24 DIAGNOSIS — I82401 Acute embolism and thrombosis of unspecified deep veins of right lower extremity: Secondary | ICD-10-CM | POA: Insufficient documentation

## 2019-05-24 NOTE — Addendum Note (Signed)
Addended by: Leeanne Rio on: 05/24/2019 02:30 PM   Modules accepted: Orders

## 2019-05-24 NOTE — Telephone Encounter (Signed)
While sarah ( CMA) on phone with patient this morning, I asked to speak with her to clarify mychart message She will be on xarelto from 04/19/19 ( when started) to 07/20/19 and then repeat US. She will come off the lasix as doesn't feel she needs She was agreeable to seeing cardiology as echo showed impaired relaxation- otherwise normal echo.

## 2019-05-24 NOTE — Telephone Encounter (Signed)
I have spoke with patient & advised on mychart message. She is scheduled today for lab & urine studies.

## 2019-05-24 NOTE — Telephone Encounter (Signed)
See mychart note that I sent today Patient needs urine scheduled. I have ordered Ask her too if she is okay with cardiology referral

## 2019-05-25 LAB — BASIC METABOLIC PANEL
BUN/Creatinine Ratio: 18 (calc) (ref 6–22)
BUN: 18 mg/dL (ref 7–25)
CO2: 22 mmol/L (ref 20–32)
Calcium: 9.3 mg/dL (ref 8.6–10.4)
Chloride: 109 mmol/L (ref 98–110)
Creat: 1.01 mg/dL — ABNORMAL HIGH (ref 0.60–0.93)
Glucose, Bld: 96 mg/dL (ref 65–99)
Potassium: 4.1 mmol/L (ref 3.5–5.3)
Sodium: 140 mmol/L (ref 135–146)

## 2019-05-25 LAB — MICROALBUMIN / CREATININE URINE RATIO
Creatinine, Urine: 50 mg/dL (ref 20–275)
Microalb Creat Ratio: 6 mcg/mg creat (ref <30)
Microalb, Ur: 0.3 mg/dL

## 2019-05-26 ENCOUNTER — Encounter: Payer: Self-pay | Admitting: Family

## 2019-05-27 ENCOUNTER — Other Ambulatory Visit: Payer: Self-pay | Admitting: Family

## 2019-05-27 DIAGNOSIS — H353190 Nonexudative age-related macular degeneration, unspecified eye, stage unspecified: Secondary | ICD-10-CM | POA: Diagnosis not present

## 2019-05-30 ENCOUNTER — Ambulatory Visit: Payer: PPO

## 2019-06-11 DIAGNOSIS — I10 Essential (primary) hypertension: Secondary | ICD-10-CM | POA: Diagnosis not present

## 2019-06-11 DIAGNOSIS — R600 Localized edema: Secondary | ICD-10-CM | POA: Diagnosis not present

## 2019-06-11 DIAGNOSIS — I824Y1 Acute embolism and thrombosis of unspecified deep veins of right proximal lower extremity: Secondary | ICD-10-CM | POA: Diagnosis not present

## 2019-06-11 DIAGNOSIS — I82441 Acute embolism and thrombosis of right tibial vein: Secondary | ICD-10-CM | POA: Diagnosis not present

## 2019-06-13 ENCOUNTER — Encounter: Payer: Self-pay | Admitting: Family

## 2019-06-13 ENCOUNTER — Telehealth: Payer: Self-pay | Admitting: Family

## 2019-06-13 DIAGNOSIS — M7989 Other specified soft tissue disorders: Secondary | ICD-10-CM

## 2019-06-13 NOTE — Telephone Encounter (Unsigned)
Copied from [place] [phone]. Topic: Quick Communication - Rx Refill/Question >> Jun 13, 2019  5:02 PM Mcneil, Ja-Kwan wrote: Medication: furosemide (LASIX) 20 MG tablet and potassium chloride (K-DUR) 10 MEQ tablet  Has the patient contacted their pharmacy? no  Preferred Pharmacy (with phone number or street name): [facility] (N), [place] [phone] (Phone) [phone] (Fax)  Agent: Please be advised that RX refills may take up to 3 business days. We ask that you follow-up with your pharmacy.

## 2019-06-14 ENCOUNTER — Other Ambulatory Visit: Payer: Self-pay

## 2019-06-14 MED ORDER — FUROSEMIDE 20 MG PO TABS: 20.0000 mg | ORAL_TABLET | Freq: Every day | ORAL | 2 refills | Status: DC | PRN

## 2019-06-14 MED ORDER — POTASSIUM CHLORIDE ER 10 MEQ PO TBCR: 10.0000 meq | EXTENDED_RELEASE_TABLET | Freq: Every day | ORAL | 2 refills | Status: DC

## 2019-06-14 NOTE — Telephone Encounter (Signed)
I spoke with patient & she did state she has some swelling of the left leg. I advised that she only take as needed. She has 2 month f/u scheduled with [name].

## 2019-06-14 NOTE — Telephone Encounter (Signed)
Patient requesting refills on lasix and potassium but from PCP notes had DC earlier this month?

## 2019-06-14 NOTE — Telephone Encounter (Signed)
Call pt Unable to see notes from cardiology  Did they advise to take lasix/ KCl daily or as needed?   Does she need for chronic leg swelling?    Please advise that I recommend prn use versus daily and remind her that she needs labs checked every couple of months to ensure kidney, electrolytes okay

## 2019-07-23 ENCOUNTER — Ambulatory Visit
Admission: RE | Admit: 2019-07-23 | Discharge: 2019-07-23 | Disposition: A | Discharge status: Home / Self Care | Payer: PPO | Source: Ambulatory Visit | Attending: Family | Admitting: Family

## 2019-07-23 ENCOUNTER — Encounter: Payer: Self-pay | Admitting: Family Medicine

## 2019-07-23 ENCOUNTER — Encounter: Payer: Self-pay | Admitting: Oncology

## 2019-07-23 ENCOUNTER — Other Ambulatory Visit: Payer: Self-pay

## 2019-07-23 DIAGNOSIS — I82441 Acute embolism and thrombosis of right tibial vein: Secondary | ICD-10-CM

## 2019-07-23 DIAGNOSIS — Z8542 Personal history of malignant neoplasm of other parts of uterus: Secondary | ICD-10-CM | POA: Diagnosis not present

## 2019-07-23 DIAGNOSIS — Z86718 Personal history of other venous thrombosis and embolism: Secondary | ICD-10-CM | POA: Diagnosis not present

## 2019-07-24 ENCOUNTER — Telehealth: Payer: Self-pay | Admitting: Lab

## 2019-07-24 ENCOUNTER — Other Ambulatory Visit: Payer: Self-pay | Admitting: Family

## 2019-07-24 ENCOUNTER — Telehealth: Payer: Self-pay | Admitting: Family

## 2019-07-24 ENCOUNTER — Ambulatory Visit: Payer: PPO

## 2019-07-24 DIAGNOSIS — I82441 Acute embolism and thrombosis of right tibial vein: Secondary | ICD-10-CM

## 2019-07-24 MED ORDER — RIVAROXABAN 20 MG PO TABS: 20.0000 mg | ORAL_TABLET | Freq: Every day | ORAL | 1 refills | Status: DC

## 2019-07-24 MED ORDER — RIVAROXABAN 10 MG PO TABS: 10.0000 mg | ORAL_TABLET | Freq: Every day | ORAL | 2 refills | Status: DC

## 2019-07-24 NOTE — Telephone Encounter (Signed)
No worries ! I will speak with patient.

## 2019-07-24 NOTE — Telephone Encounter (Signed)
[name] - I had my ultrasound today, and my blood clot is gone.  My prescription for [name] has expired.  Do you want to give me  a new prescription so I can continue taking it?  Sincerely, [name]

## 2019-07-24 NOTE — Telephone Encounter (Signed)
Call pt Dr Tasia Catchings got back to me; actually she would rather patient be on 10mg  xarelto for maintenance; I have sent to [facility]  Please call [facility] and CANCEL 20mg  xarelto dose please.   Note : crtcl 33 ml/min

## 2019-07-24 NOTE — Telephone Encounter (Signed)
I do not believe I have ever seen this patient? I don't have any visit notes in epic with her

## 2019-07-25 NOTE — Telephone Encounter (Signed)
Patient called & informed of this. I also called [facility] to cancel 20mg  script. I called patient back to let her know that her prescription should be ready before 7pm when pharmacy closed.

## 2019-07-31 ENCOUNTER — Other Ambulatory Visit: Payer: Self-pay

## 2019-07-31 ENCOUNTER — Ambulatory Visit
Admission: RE | Admit: 2019-07-31 | Discharge: 2019-07-31 | Disposition: A | Discharge status: Home / Self Care | Payer: PPO | Source: Ambulatory Visit | Attending: Oncology | Admitting: Oncology

## 2019-07-31 ENCOUNTER — Ambulatory Visit: Payer: PPO

## 2019-07-31 DIAGNOSIS — I2510 Atherosclerotic heart disease of native coronary artery without angina pectoris: Secondary | ICD-10-CM | POA: Insufficient documentation

## 2019-07-31 DIAGNOSIS — Z85038 Personal history of other malignant neoplasm of large intestine: Secondary | ICD-10-CM | POA: Insufficient documentation

## 2019-07-31 DIAGNOSIS — R911 Solitary pulmonary nodule: Secondary | ICD-10-CM | POA: Diagnosis not present

## 2019-07-31 DIAGNOSIS — J984 Other disorders of lung: Secondary | ICD-10-CM | POA: Diagnosis not present

## 2019-07-31 DIAGNOSIS — M47816 Spondylosis without myelopathy or radiculopathy, lumbar region: Secondary | ICD-10-CM | POA: Diagnosis not present

## 2019-07-31 DIAGNOSIS — C189 Malignant neoplasm of colon, unspecified: Secondary | ICD-10-CM | POA: Diagnosis not present

## 2019-07-31 LAB — POCT I-STAT CREATININE: Creatinine, Ser: 1 mg/dL (ref 0.44–1.00)

## 2019-07-31 MED ORDER — IOHEXOL 300 MG/ML  SOLN
100.0000 mL | Freq: Once | INTRAMUSCULAR | Status: AC | PRN
  Administered 2019-07-31: 100 mL via INTRAVENOUS

## 2019-08-02 ENCOUNTER — Other Ambulatory Visit: Payer: Self-pay

## 2019-08-02 ENCOUNTER — Other Ambulatory Visit: Payer: Self-pay | Admitting: Family Medicine

## 2019-08-02 ENCOUNTER — Encounter: Payer: Self-pay | Admitting: Oncology

## 2019-08-02 ENCOUNTER — Inpatient Hospital Stay (HOSPITAL_BASED_OUTPATIENT_CLINIC_OR_DEPARTMENT_OTHER): Payer: PPO | Admitting: Oncology

## 2019-08-02 ENCOUNTER — Inpatient Hospital Stay: Payer: PPO | Attending: Oncology

## 2019-08-02 VITALS — BP 135/62 | HR 56 | Temp 97.5°F | Resp 18 | Wt 224.4 lb

## 2019-08-02 DIAGNOSIS — Z95828 Presence of other vascular implants and grafts: Secondary | ICD-10-CM

## 2019-08-02 DIAGNOSIS — Z79899 Other long term (current) drug therapy: Secondary | ICD-10-CM | POA: Diagnosis not present

## 2019-08-02 DIAGNOSIS — Z9221 Personal history of antineoplastic chemotherapy: Secondary | ICD-10-CM | POA: Diagnosis not present

## 2019-08-02 DIAGNOSIS — E041 Nontoxic single thyroid nodule: Secondary | ICD-10-CM

## 2019-08-02 DIAGNOSIS — Z8542 Personal history of malignant neoplasm of other parts of uterus: Secondary | ICD-10-CM | POA: Insufficient documentation

## 2019-08-02 DIAGNOSIS — I82441 Acute embolism and thrombosis of right tibial vein: Secondary | ICD-10-CM

## 2019-08-02 DIAGNOSIS — Z86718 Personal history of other venous thrombosis and embolism: Secondary | ICD-10-CM | POA: Insufficient documentation

## 2019-08-02 DIAGNOSIS — Z923 Personal history of irradiation: Secondary | ICD-10-CM | POA: Insufficient documentation

## 2019-08-02 DIAGNOSIS — I10 Essential (primary) hypertension: Secondary | ICD-10-CM | POA: Diagnosis not present

## 2019-08-02 DIAGNOSIS — Z85038 Personal history of other malignant neoplasm of large intestine: Secondary | ICD-10-CM

## 2019-08-02 DIAGNOSIS — Z7901 Long term (current) use of anticoagulants: Secondary | ICD-10-CM | POA: Diagnosis not present

## 2019-08-02 LAB — CBC WITH DIFFERENTIAL/PLATELET
Abs Immature Granulocytes: 0.01 10*3/uL (ref 0.00–0.07)
Basophils Absolute: 0 10*3/uL (ref 0.0–0.1)
Basophils Relative: 1 %
Eosinophils Absolute: 0.2 10*3/uL (ref 0.0–0.5)
Eosinophils Relative: 5 %
HCT: 36.1 % (ref 36.0–46.0)
Hemoglobin: 11.5 g/dL — ABNORMAL LOW (ref 12.0–15.0)
Immature Granulocytes: 0 %
Lymphocytes Relative: 23 %
Lymphs Abs: 1 10*3/uL (ref 0.7–4.0)
MCH: 28.9 pg (ref 26.0–34.0)
MCHC: 31.9 g/dL (ref 30.0–36.0)
MCV: 90.7 fL (ref 80.0–100.0)
Monocytes Absolute: 0.4 10*3/uL (ref 0.1–1.0)
Monocytes Relative: 8 %
Neutro Abs: 2.9 10*3/uL (ref 1.7–7.7)
Neutrophils Relative %: 63 %
Platelets: 200 10*3/uL (ref 150–400)
RBC: 3.98 MIL/uL (ref 3.87–5.11)
RDW: 14.7 % (ref 11.5–15.5)
WBC: 4.5 10*3/uL (ref 4.0–10.5)
nRBC: 0 % (ref 0.0–0.2)

## 2019-08-02 LAB — COMPREHENSIVE METABOLIC PANEL
ALT: 16 U/L (ref 0–44)
AST: 23 U/L (ref 15–41)
Albumin: 3.5 g/dL (ref 3.5–5.0)
Alkaline Phosphatase: 97 U/L (ref 38–126)
Anion gap: 10 (ref 5–15)
BUN: 18 mg/dL (ref 8–23)
CO2: 21 mmol/L — ABNORMAL LOW (ref 22–32)
Calcium: 8.7 mg/dL — ABNORMAL LOW (ref 8.9–10.3)
Chloride: 110 mmol/L (ref 98–111)
Creatinine, Ser: 0.92 mg/dL (ref 0.44–1.00)
GFR calc Af Amer: >60 mL/min (ref >60)
GFR calc non Af Amer: 60 mL/min — ABNORMAL LOW (ref >60)
Glucose, Bld: 135 mg/dL — ABNORMAL HIGH (ref 70–99)
Potassium: 3.8 mmol/L (ref 3.5–5.1)
Sodium: 141 mmol/L (ref 135–145)
Total Bilirubin: 0.5 mg/dL (ref 0.3–1.2)
Total Protein: 6.3 g/dL — ABNORMAL LOW (ref 6.5–8.1)

## 2019-08-02 MED ORDER — HEPARIN SOD (PORK) LOCK FLUSH 100 UNIT/ML IV SOLN
500.0000 [IU] | Freq: Once | INTRAVENOUS | Status: AC
  Administered 2019-08-02: 09:00:00 500 [IU] via INTRAVENOUS

## 2019-08-02 MED ORDER — SODIUM CHLORIDE 0.9% FLUSH
10.0000 mL | Freq: Once | INTRAVENOUS | Status: AC
  Administered 2019-08-02: 10 mL via INTRAVENOUS
  Filled 2019-08-02: qty 10

## 2019-08-02 NOTE — Progress Notes (Signed)
Thyroid lab and [place] orders

## 2019-08-02 NOTE — Progress Notes (Signed)
Hematology/Oncology Follow Up visit. [place] [phone] [phone]  CONSULT NOTE Patient Care Team: Burnard Hawthorne, FNP as PCP - General (Family Medicine) Vladimir Crofts, MD (Neurology) Noreene Filbert, MD as Referring Physician (Radiation Oncology) Leighton Ruff, MD as Consulting Physician (General Surgery) Earlie Server, MD as Consulting Physician (Oncology) Jules Husbands, MD as Consulting Physician (General Surgery)  PURPOSE OF CONSULTATION:  Follow up for blood clot, and stage IIIB colon cancer  HISTORY OF PRESENTING ILLNESS:  [name] 78 y.o.  female with PMH listed as below was referred to me for evaluation and management of colon cancer.  She had colonoscopy evaluation due to positive cologuard.  She was found to have a nonobstructing mass approximately 20 cm from the anal verge which biopsy proved to be adenocarcinoma. CT scan showed a large mass from the sigmoid and extends beyond the wall of the colon toward the left into the surrounding fat. Preoperational CEA was normal. Patient underwent robotic sigmoidectomy on 06/07/2017.   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 MLH1  loss of function, PMS2 loss of function. Declined option of clinical trial ATOMIC She has a history of endometrial cancer s/p hysterotomy and radiation. She has a family history of colon cancer and breast cancer.  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 Results have been explained to patient by genetic counselor.   Oncology treatment S/p adjuvant chemotherapy finished February 2019 Adjuvant RT finished in April 2019.  05/22/2018 Colonoscopy surveillance by Dr.Wohl showed 10 mm polyp in ascending colon.  Removed.  Patent end-to-end colo-colonic anastomosis Pathology showed  benign polyp.  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   INTERVAL HISTORY 78 yo female with above history presents for follow up per primary care provider for evaluation of blood clots.  History of Stage IIIB colon cancer #reports feeling well today. Denies any new complaints.  Denies hematochezia, hematuria, hematemesis, epistaxis, black tarry stool or easy bruising.   Bilateral lower extremity swelling has improved.  She takes Xarelto, dose was recently reduced to Xarelto 16m daily.   Port A cath, she gets port flushed every 6-8 weeks. Denies any concerns of the port.   Review of Systems  Constitutional: Negative for appetite change, chills, fatigue and fever.  HENT:   Negative for hearing loss and voice change.   Eyes: Negative for eye problems.  Respiratory: Negative for chest tightness and cough.   Cardiovascular: Negative for chest pain and leg swelling.  Gastrointestinal: Negative for abdominal distention, abdominal pain, blood in stool and diarrhea.  Endocrine: Negative for hot flashes.  Genitourinary: Negative for difficulty urinating and frequency.   Musculoskeletal: Negative for arthralgias.  Skin: Negative for itching and rash.  Neurological: Negative for extremity weakness.  Hematological: Negative for adenopathy.  Psychiatric/Behavioral: Negative for confusion.   MEDICAL HISTORY:  Past Medical History:  Diagnosis Date  . Arthritis    bitateral knees and left foot,s/p cortisone injection in past  . Chronic constipation   . Colon cancer ([place]   pt is undergoing chemo 11-09-17  . Complication of anesthesia     slow to wake up   . [place] [place]   Total Hysterectomy and Rad tx's.   . Family history of cancer   . Genetic testing 09/26/2017    Multi-Cancer panel (83 genes) @  [facility] - No pathogenic mutations detected  . GERD (gastroesophageal reflux disease)   . Hypertension   . IBS (irritable bowel syndrome)   . Macular degeneration of both eyes    . Menopause    age 27  . PONV (postoperative nausea and vomiting)   . Vitamin D deficiency 06/07/10    SURGICAL HISTORY: Past Surgical History:  Procedure Laterality Date  . BUNIONECTOMY    . CATARACT EXTRACTION W/PHACO Left 09/24/2018   Procedure: CATARACT EXTRACTION PHACO AND INTRAOCULAR LENS PLACEMENT ([place]) LEFT;  Surgeon: Eulogio Bear, MD;  Location: [facility];  Service: Ophthalmology;  Laterality: Left;  . CATARACT EXTRACTION W/PHACO Right 10/15/2018   Procedure: CATARACT EXTRACTION PHACO AND INTRAOCULAR LENS PLACEMENT ([place]) RIGHT;  Surgeon: Eulogio Bear, MD;  Location: [facility];  Service: Ophthalmology;  Laterality: Right;  . cataract surgery     both eyes  . COLONOSCOPY WITH PROPOFOL N/A 04/04/2017   Procedure: COLONOSCOPY WITH PROPOFOL;  Surgeon: [name], MD;  [facility];  Service: Endoscopy;  Laterality: N/A;  . COLONOSCOPY WITH PROPOFOL N/A 05/22/2018   Procedure: COLONOSCOPY WITH PROPOFOL;  Surgeon: [name], MD;  Location: [facility] ENDOSCOPY;  Service: Endoscopy;  Laterality: N/A;  . DILATION AND CURETTAGE OF UTERUS  1971  . FOOT SURGERY    . NASAL SINUS SURGERY    . PORTACATH PLACEMENT Right 07/12/2017   Procedure: INSERTION PORT-A-CATH;  Surgeon: Jules Husbands, MD;  Location: [facility];  Service: General;  Laterality: Right;  . ROBOTIC ASSISTED LAPAROSCOPIC VENTRAL/INCISIONAL HERNIA REPAIR N/A 06/07/2017   Procedure: ROBOTIC INCISIONAL HERNIA REPAIR;  Surgeon: Leighton Ruff, MD;  Location: [facility];  Service: General;  Laterality: N/A;  . VAGINAL DELIVERY    . VAGINAL HYSTERECTOMY      SOCIAL HISTORY: Social History   Socioeconomic History  . Marital status: Divorced    Spouse name: Not on file  . Number of children: Not on file  . Years of education: Not on file  . Highest education level: Not on file  Occupational History  . Not on file  Social Needs  . Financial resource strain: Not hard at all  . Food  insecurity    Worry: Never true    Inability: Never true  . Transportation needs    Medical: No    Non-medical: No  Tobacco Use  . Smoking status: Never Smoker  . Smokeless tobacco: Never Used  Substance and Sexual Activity  . Alcohol use: Yes    Comment: rarely - Holidays  . Drug use: No  . Sexual activity: Never  Lifestyle  . Physical activity    Days per week: Not on file    Minutes per session: Not on file  . Stress: Not at all  Relationships  . Social Herbalist on phone: Not on file    Gets together: Not on file    Attends religious service: Not on file    Active member of club or organization: Not on file    Attends meetings of clubs or organizations: Not on file    Relationship status: Not on file  . Intimate partner violence    Fear of current or ex partner: Not on file    Emotionally abused: Not on file    Physically abused: Not on file    Forced sexual activity: Not on file  Other Topics Concern  . Not on file  Social History Narrative   Exercise- aerobic exercise at home  3-4x week.       Lives in [place]. No pets.    FAMILY HISTORY: Family History  Problem Relation Age of Onset  . Heart disease Father 96       massive MI  . Diabetes Father   . Bipolar disorder Sister   . Osteoporosis Sister   . Breast cancer Sister 72       currently 20  . Cancer Sister        breast  . Bipolar disorder Maternal Aunt   . Breast cancer Maternal Aunt        age at dx unknown; deceased 63s  . Colon cancer Maternal Grandfather 15       deceased 22  . Cancer Maternal Grandfather        breast  . Osteoporosis Mother   . Colon cancer Mother        age at dx unknown; deceased 43  . Cancer Maternal Grandmother        unk. type; deceased 38  . Breast cancer Paternal Aunt        deceased 82    ALLERGIES:  is allergic to codeine.  MEDICATIONS:  Current Outpatient Medications  Medication Sig Dispense Refill  . fluticasone (FLONASE) 50 MCG/ACT nasal  spray Place 2 sprays into both nostrils daily. (Patient taking differently: Place 2 sprays into both nostrils daily as needed. ) 16 g 2  . losartan (COZAAR) 50 MG tablet Take 1 tablet (50 mg total) by mouth daily. 90 tablet 3  . metoprolol succinate (TOPROL-XL) 100 MG 24 hr tablet TAKE 1 TABLET DAILY 90 tablet 3  . Multiple Vitamin (MULTIVITAMIN WITH MINERALS) TABS tablet Take 1 tablet by mouth daily.    . Multiple Vitamins-Minerals (ICAPS AREDS 2 PO) Take 2 capsules by mouth daily.    . phenylephrine (SUDAFED PE) 10 MG TABS tablet Take 10 mg by mouth every 4 (four) hours as needed.    . rivaroxaban (XARELTO) 10 MG TABS tablet Take 1 tablet (10 mg total) by mouth daily. 30 tablet 2  . topiramate (TOPAMAX) 25 MG tablet Take by mouth.    . topiramate (TOPAMAX) 50 MG tablet TAKE 1 TABLET TWICE DAILY 180 tablet 0  . Docusate Calcium (STOOL SOFTENER PO) Take by mouth daily as needed.    . furosemide (LASIX) 20 MG tablet Take 1 tablet (20 mg total) by mouth daily as needed for up to 3 days. (Patient not taking: Reported on 08/02/2019) 30 tablet 2  . potassium chloride (K-DUR) 10 MEQ tablet Take 1 tablet (10 mEq total) by mouth daily. With lasix. (Patient not taking: Reported on 08/02/2019) 30 tablet 2   No current facility-administered medications for this visit.       Marland Kitchen  PHYSICAL EXAMINATION: ECOG PERFORMANCE STATUS: 0 - Asymptomatic Vitals:   08/02/19 0948  BP: 135/62  Pulse: (!) 56  Resp: 18  Temp: (!) 97.5 F (36.4 C)  SpO2: 100%   Filed Weights   08/02/19 0948  Weight: 224 lb 6.4 oz (101.8 kg)   Physical Exam Constitutional:      General: She is not in acute distress.    Appearance: She is well-developed.  HENT:     Head: Normocephalic and atraumatic.     Mouth/Throat:     Pharynx: No oropharyngeal exudate.  Eyes:     General: No scleral icterus.    Conjunctiva/sclera: Conjunctivae normal.     Pupils: Pupils are equal, round, and reactive to light.  Neck:  Musculoskeletal: Normal range of motion and neck supple.  Cardiovascular:     Rate and Rhythm: Normal rate and regular rhythm.     Heart sounds: Normal heart sounds. No murmur.  Pulmonary:     Effort: Pulmonary effort is normal. No respiratory distress.     Breath sounds: Normal breath sounds. No stridor. No wheezing or rales.  Chest:     Chest wall: No tenderness.  Abdominal:     General: Bowel sounds are normal. There is no distension.     Palpations: Abdomen is soft. There is no mass.     Tenderness: There is no abdominal tenderness. There is no guarding.  Musculoskeletal: Normal range of motion.        General: No swelling or deformity.  Lymphadenopathy:     Cervical: No cervical adenopathy.  Skin:    General: Skin is warm and dry.     Findings: No erythema or rash.     Comments: Medi-port.   Neurological:     Mental Status: She is alert and oriented to person, place, and time.     Cranial Nerves: No cranial nerve deficit.     Coordination: Coordination normal.  Psychiatric:        Behavior: Behavior normal.        Thought Content: Thought content normal.     LABORATORY DATA:  I have reviewed the data as listed Lab Results  Component Value Date   WBC 4.5 08/02/2019   HGB 11.5 (L) 08/02/2019   HCT 36.1 08/02/2019   MCV 90.7 08/02/2019   PLT 200 08/02/2019   Recent Labs    04/30/19 1125 05/21/19 1525 05/24/19 1431 07/31/19 1108 08/02/19 0917  NA 139 141 140  --  141  K 3.8 4.2 4.1  --  3.8  CL 110 108 109  --  110  CO2 '22 25 22  ' --  21*  GLUCOSE 146* 131* 96  --  135*  BUN '19 17 18  ' --  18  CREATININE 0.92 1.02* 1.01* 1.00 0.92  CALCIUM 9.1 9.2 9.3  --  8.7*  GFRNONAA 60* 53*  --   --  60*  GFRAA >60 >60  --   --  >60  PROT 7.1 6.8  --   --  6.3*  ALBUMIN 3.8 3.7  --   --  3.5  AST 23 23  --   --  23  ALT 18 16  --   --  16  ALKPHOS 108 103  --   --  97  BILITOT 0.5 0.6  --   --  0.5    RADIOGRAPHIC STUDIES: I have personally reviewed the  radiological images as listed and agreed with the findings in the report. 04/12/2017 CT abdomen pelvis w contrast Large mass arising from the sigmoid colon. This mass extends beyond the wall of the colon toward the left into the surrounding fat. This lesion does not reach the left pelvic sidewall.No adenopathy or liver lesions evident. No omental lesions appreciable. Sizable midline ventral hernia slightly superior to theumbilicus containing fat and a loop of colon without bowel compromise. Much smaller midline umbilical ventral hernia containingonly fat. There is a small hiatal hernia. No bowel obstruction.  No abscess.  Appendix region appears normal. No renal or ureteral calculus.  No hydronephrosis.There is aortoiliac atherosclerosis. There are foci of coronary artery calcification.  RADIOGRAPHIC STUDIES: I have personally reviewed the radiological images as listed and agreed with the findings in the report. 07/31/2018  No evidence of recurrent or metastatic carcinoma, or other significant abnormality.  Aortic and coronary artery atherosclerosis incidentally noted  01/24/2019 CT chest abdomen pelvis with contrast showed a stable exam.  No evidence of recurrent or metastatic carcinoma within the chest, abdomen, or pelvis.  Aortic coronary artery atherosclerosis. Stable 4 mm pulmonary nodule  ASSESSMENT & PLAN:  78 y.o.female who has Stage IIIB colon cancer.  Cancer Staging Cancer of descending colon [facility]) Staging form: Colon and Rectum, AJCC 8th Edition - Pathologic: Stage IIIB (pT4a, pN1, cM0) - Signed by Earlie Server, MD on 04/30/2018  1. History of colon cancer   2. Port-A-Cath in place   3. Deep vein thrombosis (DVT) of tibial vein of right lower extremity, unspecified chronicity ([place])   4. Thyroid nodule    # History of colon cancer - diagnosed 06/07/2017, finished all treatment April 2019.  Clinically doing well.  CEA has been followed.  Surveillance CT scan was independently  reviewed and discussed with patient. No evidence of recurrence or metastatic Continue follow-up with history and physical every 3 months and a CT scan surveillance every 6 months.  #Port-A-Cath in place, patient desires to have port removed after February 2021 which will be 2 years after she finishes adjuvant chemotherapy.  Continue port flush every 6 to 8 weeks.  #Right below knee age indeterminate DVT, Completed 3 months of Xarelto at 20 mg daily.  Repeat US venous  duplex showed negative for DVT.  Agree with decreasing Xarelto to 10 mg daily Discussed with patient about long-term DVT prophylaxis with Xarelto 10 mg daily. She tolerates well.  #Thyroid nodule, incidental finding from CT surveillance for colon cancer. Discussed with patient's primary care provider [name].  Patient to follow-up with PCP for further evaluation with ultrasound thyroid.    Orders Placed This Encounter  Procedures  . CBC with Differential/Platelet    Standing Status:   Future    Standing Expiration Date:   08/01/2020  . Comprehensive metabolic panel    Standing Status:   Future    Standing Expiration Date:   08/01/2020  . CEA    Standing Status:   Future    Standing Expiration Date:   08/01/2020    Return of visit: 3 months.    Earlie Server, MD, PhD Hematology Oncology [facility] Pager- [phone] 08/02/2019

## 2019-08-02 NOTE — Progress Notes (Signed)
Pt in for follow up, denies any difficulties or concerns.  

## 2019-08-03 LAB — CEA: CEA: 1.3 ng/mL (ref 0.0–4.7)

## 2019-08-06 ENCOUNTER — Encounter: Payer: Self-pay | Admitting: Oncology

## 2019-08-07 ENCOUNTER — Encounter: Payer: Self-pay | Admitting: Oncology

## 2019-08-08 ENCOUNTER — Other Ambulatory Visit: Payer: Self-pay

## 2019-08-12 ENCOUNTER — Ambulatory Visit (INDEPENDENT_AMBULATORY_CARE_PROVIDER_SITE_OTHER): Payer: PPO | Admitting: Family Medicine

## 2019-08-12 ENCOUNTER — Other Ambulatory Visit: Payer: Self-pay

## 2019-08-12 ENCOUNTER — Encounter: Payer: Self-pay | Admitting: Family Medicine

## 2019-08-12 VITALS — BP 144/78 | HR 66 | Temp 96.0°F | Resp 16 | Wt 227.0 lb

## 2019-08-12 DIAGNOSIS — I10 Essential (primary) hypertension: Secondary | ICD-10-CM

## 2019-08-12 DIAGNOSIS — Z8639 Personal history of other endocrine, nutritional and metabolic disease: Secondary | ICD-10-CM | POA: Insufficient documentation

## 2019-08-12 DIAGNOSIS — E041 Nontoxic single thyroid nodule: Secondary | ICD-10-CM

## 2019-08-12 NOTE — Progress Notes (Signed)
Subjective:    Patient ID: [name], female    DOB: January 12, 1941, 78 y.o.   MRN: [id number]  HPI  Patient presents to clinic for follow-up on blood pressure and thyroid.  Patient has been tolerating her blood pressure medications without any problems.  Denies chest pain, palpitations, lower extremity swelling or shortness of breath.  She is getting thyroid ultrasound this Friday for follow-up on thyroid nodule.  We will also recheck thyroid panel.  Patient states her energy level is at baseline and overall feels like her normal self. Patient Active Problem List   Diagnosis Date Noted  . Right leg DVT ([place]) 05/24/2019  . Acute deep vein thrombosis (DVT) of tibial vein of right lower extremity ([place]) 04/22/2019  . Leg swelling 04/17/2019  . Personal history of colon cancer   . Benign neoplasm of ascending colon   . Genetic testing 09/26/2017  . Family history of cancer   . Cancer of descending colon ([place]) 06/07/2017  . Abnormal stools   . Neoplasm of digestive system   . Bradycardia 02/14/2017  . HLD (hyperlipidemia) 02/13/2017  . Onychomycosis 12/29/2016  . Osteopenia 08/16/2016  . Macular degeneration 08/12/2015  . Routine general medical examination at a health care facility 09/26/2014  . Chronic constipation 05/27/2014  . Bartholin cyst 03/28/2014  . History of endometrial cancer 12/30/2013  . Essential hypertension, benign 04/17/2013  . Tremor 07/08/2011   Social History   Tobacco Use  . Smoking status: Never Smoker  . Smokeless tobacco: Never Used  Substance Use Topics  . Alcohol use: Yes    Comment: rarely - Holidays   Review of Systems  Constitutional: Negative for chills, fatigue and fever.  HENT: Negative for congestion, ear pain, sinus pain and sore throat.   Eyes: Negative.   Respiratory: Negative for cough, shortness of breath and wheezing.   Cardiovascular: Negative for chest pain, palpitations and leg swelling.  Gastrointestinal: Negative for  abdominal pain, diarrhea, nausea and vomiting.  Genitourinary: Negative for dysuria, frequency and urgency.  Musculoskeletal: Negative for arthralgias and myalgias.  Skin: Negative for color change, pallor and rash.  Neurological: Negative for syncope, light-headedness and headaches.  Psychiatric/Behavioral: The patient is not nervous/anxious.       Objective:   Physical Exam Vitals signs and nursing note reviewed.  Constitutional:      General: She is not in acute distress.    Appearance: She is not ill-appearing, toxic-appearing or diaphoretic.  HENT:     Head: Normocephalic and atraumatic.  Eyes:     General: No scleral icterus.       Right eye: No discharge.        Left eye: No discharge.     Extraocular Movements: Extraocular movements intact.     Pupils: Pupils are equal, round, and reactive to light.  Neck:     Musculoskeletal: Normal range of motion and neck supple.  Cardiovascular:     Rate and Rhythm: Normal rate and regular rhythm.     Heart sounds: Normal heart sounds.  Pulmonary:     Effort: Pulmonary effort is normal. No respiratory distress.     Breath sounds: Normal breath sounds.  Musculoskeletal:     Right lower leg: No edema.     Left lower leg: No edema.  Skin:    General: Skin is warm and dry.     Coloration: Skin is not jaundiced or pale.  Neurological:     General: No focal deficit present.  Mental Status: She is alert and oriented to person, place, and time.  Psychiatric:        Mood and Affect: Mood normal.        Behavior: Behavior normal.        Thought Content: Thought content normal.    Today's Vitals   08/12/19 1448  BP: (!) 144/78  Pulse: 66  Resp: 16  Temp: (!) 96 F (35.6 C)  TempSrc: Temporal  SpO2: 96%  Weight: 227 lb (103 kg)   Body mass index is 37.77 kg/m.     Assessment & Plan:    Hypothyroidism-we will get thyroid ultrasound as planned this Friday for follow-up on nodule and also recheck thyroid panel to see if  levothyroxine dose needs adjustment.  Hypertension-blood pressure overall is stable on current medication regimen.  Tolerating these medicines without any issues.  Patient will keep all regular follow-ups with PCP and specialist as planned.  She will call office anytime with questions or concerns

## 2019-08-13 LAB — BASIC METABOLIC PANEL
BUN: 17 mg/dL (ref 6–23)
CO2: 24 mEq/L (ref 19–32)
Calcium: 9.3 mg/dL (ref 8.4–10.5)
Chloride: 108 mEq/L (ref 96–112)
Creatinine, Ser: 0.89 mg/dL (ref 0.40–1.20)
GFR: 61.29 mL/min (ref >60.00)
Glucose, Bld: 79 mg/dL (ref 70–99)
Potassium: 4.2 mEq/L (ref 3.5–5.1)
Sodium: 139 mEq/L (ref 135–145)

## 2019-08-13 LAB — THYROID PANEL WITH TSH
Free Thyroxine Index: 1.8 (ref 1.4–3.8)
T3 Uptake: 33 % (ref 22–35)
T4, Total: 5.4 ug/dL (ref 5.1–11.9)
TSH: 2.34 mIU/L (ref 0.40–4.50)

## 2019-08-16 ENCOUNTER — Ambulatory Visit
Admission: RE | Admit: 2019-08-16 | Discharge: 2019-08-16 | Disposition: A | Discharge status: Home / Self Care | Payer: PPO | Source: Ambulatory Visit | Attending: Family Medicine | Admitting: Family Medicine

## 2019-08-16 ENCOUNTER — Other Ambulatory Visit: Payer: Self-pay

## 2019-08-16 DIAGNOSIS — E041 Nontoxic single thyroid nodule: Secondary | ICD-10-CM

## 2019-08-16 DIAGNOSIS — E042 Nontoxic multinodular goiter: Secondary | ICD-10-CM | POA: Diagnosis not present

## 2019-09-04 ENCOUNTER — Other Ambulatory Visit: Payer: Self-pay

## 2019-09-05 ENCOUNTER — Ambulatory Visit: Payer: PPO | Admitting: Family Medicine

## 2019-09-30 ENCOUNTER — Other Ambulatory Visit: Payer: Self-pay | Admitting: Family

## 2019-09-30 DIAGNOSIS — Z1231 Encounter for screening mammogram for malignant neoplasm of breast: Secondary | ICD-10-CM

## 2019-10-30 ENCOUNTER — Other Ambulatory Visit: Payer: PPO

## 2019-10-30 ENCOUNTER — Ambulatory Visit: Payer: PPO | Admitting: Oncology

## 2019-10-30 ENCOUNTER — Other Ambulatory Visit: Payer: Self-pay

## 2019-10-30 NOTE — Progress Notes (Signed)
Patient pre screened for office appointment, no questions or concerns today. Patient reminded of upcoming appointment time and date. 

## 2019-10-31 ENCOUNTER — Other Ambulatory Visit: Payer: Self-pay

## 2019-10-31 ENCOUNTER — Encounter: Payer: Self-pay | Admitting: Oncology

## 2019-10-31 ENCOUNTER — Inpatient Hospital Stay (HOSPITAL_BASED_OUTPATIENT_CLINIC_OR_DEPARTMENT_OTHER): Payer: PPO | Admitting: Oncology

## 2019-10-31 ENCOUNTER — Ambulatory Visit
Admission: RE | Admit: 2019-10-31 | Discharge: 2019-10-31 | Disposition: A | Discharge status: Home / Self Care | Payer: PPO | Source: Ambulatory Visit | Attending: Radiation Oncology | Admitting: Radiation Oncology

## 2019-10-31 ENCOUNTER — Inpatient Hospital Stay: Payer: PPO | Attending: Oncology

## 2019-10-31 VITALS — BP 124/76 | HR 53 | Temp 97.7°F | Resp 20 | Wt 229.6 lb

## 2019-10-31 DIAGNOSIS — Z923 Personal history of irradiation: Secondary | ICD-10-CM | POA: Insufficient documentation

## 2019-10-31 DIAGNOSIS — C187 Malignant neoplasm of sigmoid colon: Secondary | ICD-10-CM

## 2019-10-31 DIAGNOSIS — Z7901 Long term (current) use of anticoagulants: Secondary | ICD-10-CM | POA: Insufficient documentation

## 2019-10-31 DIAGNOSIS — Z8542 Personal history of malignant neoplasm of other parts of uterus: Secondary | ICD-10-CM | POA: Diagnosis not present

## 2019-10-31 DIAGNOSIS — Z85038 Personal history of other malignant neoplasm of large intestine: Secondary | ICD-10-CM

## 2019-10-31 DIAGNOSIS — Z85048 Personal history of other malignant neoplasm of rectum, rectosigmoid junction, and anus: Secondary | ICD-10-CM | POA: Insufficient documentation

## 2019-10-31 DIAGNOSIS — K219 Gastro-esophageal reflux disease without esophagitis: Secondary | ICD-10-CM | POA: Diagnosis not present

## 2019-10-31 DIAGNOSIS — Z9221 Personal history of antineoplastic chemotherapy: Secondary | ICD-10-CM | POA: Insufficient documentation

## 2019-10-31 DIAGNOSIS — Z79899 Other long term (current) drug therapy: Secondary | ICD-10-CM | POA: Diagnosis not present

## 2019-10-31 DIAGNOSIS — Z95828 Presence of other vascular implants and grafts: Secondary | ICD-10-CM | POA: Diagnosis not present

## 2019-10-31 DIAGNOSIS — E041 Nontoxic single thyroid nodule: Secondary | ICD-10-CM | POA: Insufficient documentation

## 2019-10-31 DIAGNOSIS — M1990 Unspecified osteoarthritis, unspecified site: Secondary | ICD-10-CM | POA: Insufficient documentation

## 2019-10-31 DIAGNOSIS — Z86718 Personal history of other venous thrombosis and embolism: Secondary | ICD-10-CM | POA: Insufficient documentation

## 2019-10-31 DIAGNOSIS — Z452 Encounter for adjustment and management of vascular access device: Secondary | ICD-10-CM | POA: Diagnosis not present

## 2019-10-31 DIAGNOSIS — C186 Malignant neoplasm of descending colon: Secondary | ICD-10-CM | POA: Insufficient documentation

## 2019-10-31 DIAGNOSIS — I10 Essential (primary) hypertension: Secondary | ICD-10-CM | POA: Insufficient documentation

## 2019-10-31 DIAGNOSIS — K580 Irritable bowel syndrome with diarrhea: Secondary | ICD-10-CM | POA: Diagnosis not present

## 2019-10-31 DIAGNOSIS — R197 Diarrhea, unspecified: Secondary | ICD-10-CM | POA: Insufficient documentation

## 2019-10-31 DIAGNOSIS — I82441 Acute embolism and thrombosis of right tibial vein: Secondary | ICD-10-CM | POA: Diagnosis not present

## 2019-10-31 LAB — CBC WITH DIFFERENTIAL/PLATELET
Abs Immature Granulocytes: 0.02 10*3/uL (ref 0.00–0.07)
Basophils Absolute: 0 10*3/uL (ref 0.0–0.1)
Basophils Relative: 1 %
Eosinophils Absolute: 0.2 10*3/uL (ref 0.0–0.5)
Eosinophils Relative: 4 %
HCT: 37.8 % (ref 36.0–46.0)
Hemoglobin: 11.9 g/dL — ABNORMAL LOW (ref 12.0–15.0)
Immature Granulocytes: 1 %
Lymphocytes Relative: 27 %
Lymphs Abs: 1.1 10*3/uL (ref 0.7–4.0)
MCH: 29.2 pg (ref 26.0–34.0)
MCHC: 31.5 g/dL (ref 30.0–36.0)
MCV: 92.9 fL (ref 80.0–100.0)
Monocytes Absolute: 0.3 10*3/uL (ref 0.1–1.0)
Monocytes Relative: 8 %
Neutro Abs: 2.5 10*3/uL (ref 1.7–7.7)
Neutrophils Relative %: 59 %
Platelets: 206 10*3/uL (ref 150–400)
RBC: 4.07 MIL/uL (ref 3.87–5.11)
RDW: 14 % (ref 11.5–15.5)
WBC: 4.2 10*3/uL (ref 4.0–10.5)
nRBC: 0 % (ref 0.0–0.2)

## 2019-10-31 LAB — COMPREHENSIVE METABOLIC PANEL
ALT: 17 U/L (ref 0–44)
AST: 22 U/L (ref 15–41)
Albumin: 3.6 g/dL (ref 3.5–5.0)
Alkaline Phosphatase: 104 U/L (ref 38–126)
Anion gap: 7 (ref 5–15)
BUN: 18 mg/dL (ref 8–23)
CO2: 23 mmol/L (ref 22–32)
Calcium: 9.2 mg/dL (ref 8.9–10.3)
Chloride: 112 mmol/L — ABNORMAL HIGH (ref 98–111)
Creatinine, Ser: 0.89 mg/dL (ref 0.44–1.00)
GFR calc Af Amer: >60 mL/min (ref >60)
GFR calc non Af Amer: >60 mL/min (ref >60)
Glucose, Bld: 123 mg/dL — ABNORMAL HIGH (ref 70–99)
Potassium: 4.2 mmol/L (ref 3.5–5.1)
Sodium: 142 mmol/L (ref 135–145)
Total Bilirubin: 0.7 mg/dL (ref 0.3–1.2)
Total Protein: 6.7 g/dL (ref 6.5–8.1)

## 2019-10-31 MED ORDER — RIVAROXABAN 10 MG PO TABS: 10.0000 mg | ORAL_TABLET | Freq: Every day | ORAL | 0 refills | Status: DC

## 2019-10-31 NOTE — Progress Notes (Signed)
Patient does not offer any problems today.  

## 2019-10-31 NOTE — Progress Notes (Signed)
Radiation Oncology Follow up Note  Name: [name]   Date:   10/31/2019 MRN:  [id number] DOB: 09/13/1941    This 77 y.o. female presents to the clinic today for 14-month follow-up status post adjuvant radiation therapy for stage IIIb local advanced rectal cancer in patient with history of endometrial carcinoma receiving vaginal brachytherapy  REFERRING PROVIDER: Burnard Hawthorne, FNP  HPI: Patient is a 78 year old female now about 18 months having completed adjuvant chemotherapy for stage IIIb adenocarcinoma the rectum status post robotic colectomy and patient with a prior history of endometrial carcinoma receiving vaginal brachytherapy..  She is seen today in routine follow-up is doing well status still has occasional intermittent diarrhea does occasionally take Imodium does not follow any restrictive diet.  She had a CT scan back in September showing no's evidence to suggest active disease.  She has not had a follow-up colonoscopy.  Her CEA is stable.  I have asked the patient about colonoscopy although she has declined that option.  COMPLICATIONS OF TREATMENT: none  FOLLOW UP COMPLIANCE: keeps appointments   PHYSICAL EXAM:  There were no vitals taken for this visit. Well-developed well-nourished patient in NAD. HEENT reveals PERLA, EOMI, discs not visualized.  Oral cavity is clear. No oral mucosal lesions are identified. Neck is clear without evidence of cervical or supraclavicular adenopathy. Lungs are clear to A&P. Cardiac examination is essentially unremarkable with regular rate and rhythm without murmur rub or thrill. Abdomen is benign with no organomegaly or masses noted. Motor sensory and DTR levels are equal and symmetric in the upper and lower extremities. Cranial nerves II through XII are grossly intact. Proprioception is intact. No peripheral adenopathy or edema is identified. No motor or sensory levels are noted. Crude visual fields are within normal range.  RADIOLOGY RESULTS:  CT scans abdomen pelvis and chest all reviewed compatible with above-stated findings  PLAN: Present time patient is doing well with no evidence of disease by CT criteria.  She continues close surveillance by medical oncology with CT surveillance.  I have asked to see her back in 1 year for follow-up.  Patient knows to call with any concerns.  I have suggested a low residue diet as well as continue with Imodium as needed.  Patient comprehends my recommendations well.  I would like to take this opportunity to thank you for allowing me to participate in the care of your patient.Noreene Filbert, MD

## 2019-10-31 NOTE — Progress Notes (Signed)
Hematology/Oncology Follow Up visit. [facility] [phone] [phone]  CONSULT NOTE Patient Care Team: Burnard Hawthorne, FNP as PCP - General (Family Medicine) Vladimir Crofts, MD (Neurology) Noreene Filbert, MD as Referring Physician (Radiation Oncology) Leighton Ruff, MD as Consulting Physician (General Surgery) Earlie Server, MD as Consulting Physician (Oncology) Jules Husbands, MD as Consulting Physician (General Surgery)  PURPOSE OF CONSULTATION:  Follow up for blood clot, and stage IIIB colon cancer  HISTORY OF PRESENTING ILLNESS:  [name] 78 y.o.  female with PMH listed as below was referred to me for evaluation and management of colon cancer.  She had colonoscopy evaluation due to positive cologuard.  She was found to have a nonobstructing mass approximately 20 cm from the anal verge which biopsy proved to be adenocarcinoma. CT scan showed a large mass from the sigmoid and extends beyond the wall of the colon toward the left into the surrounding fat. Preoperational CEA was normal. Patient underwent robotic sigmoidectomy on 06/07/2017.   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 MLH1  loss of function, PMS2 loss of function. Declined option of clinical trial ATOMIC She has a history of endometrial cancer s/p hysterotomy and radiation. She has a family history of colon cancer and breast cancer.  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 Results have been explained to patient by genetic counselor.   Oncology treatment S/p adjuvant chemotherapy finished February 2019 Adjuvant RT finished in April 2019.  05/22/2018 Colonoscopy surveillance by Dr.Wohl showed 10 mm polyp in ascending colon.  Removed.  Patent end-to-end colo-colonic anastomosis Pathology showed  benign polyp.  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   INTERVAL HISTORY 78 yo female with above history presents for follow up per primary care provider for evaluation of blood clots.  History of Stage IIIB colon cancer Patient reports feeling well. She continues to have intermittent diarrhea, no change. Otherwise no new complaints. 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  No bleeding events.  Denies any easy bruising or bleeding. Denies any lower extremity swelling. Port-A-Cath, she gets port flush every 6 to 8 weeks.    Review of Systems  Constitutional: Negative for appetite change, chills, fatigue and fever.  HENT:   Negative for hearing loss and voice change.   Eyes: Negative for eye problems.  Respiratory: Negative for chest tightness and cough.   Cardiovascular: Negative for chest pain and leg swelling.  Gastrointestinal: Negative for abdominal distention, abdominal pain, blood in stool and diarrhea.  Endocrine: Negative for hot flashes.  Genitourinary: Negative for difficulty urinating and frequency.   Musculoskeletal: Negative for arthralgias.  Skin: Negative for itching and rash.  Neurological: Negative for extremity weakness.  Hematological: Negative for adenopathy.  Psychiatric/Behavioral: Negative for confusion.   MEDICAL HISTORY:  Past Medical History:  Diagnosis Date  . Arthritis    bitateral knees and left foot,s/p cortisone injection in past  . Chronic constipation   . Colon cancer ([place]) 2018   pt is undergoing chemo 11-09-17  . Complication of anesthesia     slow to wake up   . Endometrial cancer ([place]) 2014   Total Hysterectomy and Rad tx's.   . Family history of cancer   . Genetic testing 09/26/2017    Multi-Cancer panel (  83 genes) @ [facility] - No pathogenic mutations detected  . GERD (gastroesophageal reflux disease)   . Hypertension   . IBS (irritable bowel syndrome)   .  Macular degeneration of both eyes   . Menopause    age 89  . PONV (postoperative nausea and vomiting)   . Vitamin D deficiency 06/07/10    SURGICAL HISTORY: Past Surgical History:  Procedure Laterality Date  . BUNIONECTOMY    . CATARACT EXTRACTION W/PHACO Left 09/24/2018   Procedure: CATARACT EXTRACTION PHACO AND INTRAOCULAR LENS PLACEMENT ([place]) LEFT;  Surgeon: Eulogio Bear, MD;  Location: [facility];  Service: Ophthalmology;  Laterality: Left;  . CATARACT EXTRACTION W/PHACO Right 10/15/2018   Procedure: CATARACT EXTRACTION PHACO AND INTRAOCULAR LENS PLACEMENT ([place]) RIGHT;  Surgeon: Eulogio Bear, MD;  Location: [facility];  Service: Ophthalmology;  Laterality: Right;  . cataract surgery     both eyes  . COLONOSCOPY WITH PROPOFOL N/A 04/04/2017   Procedure: COLONOSCOPY WITH PROPOFOL;  Surgeon: [name], MD;  [facility] END[facility]COPY;  Service: Endoscopy;  Laterality: N/A;  . COLONOSCOPY WITH PROPOFOL N/A 05/22/2018   Procedure: COLONOSCOPY WITH PROPOFOL;  Surgeon: [name], MD;  Location: [facility] ENDOSCOPY;  Service: Endoscopy;  Laterality: N/A;  . DILATION AND CURETTAGE OF UTERUS  1971  . FOOT SURGERY    . NASAL SINUS SURGERY    . PORTACATH PLACEMENT Right 07/12/2017   Procedure: INSERTION PORT-A-CATH;  Surgeon: Jules Husbands, MD;  Location: [facility] ORS;  Service: General;  Laterality: Right;  . ROBOTIC ASSISTED LAPAROSCOPIC VENTRAL/INCISIONAL HERNIA REPAIR N/A 06/07/2017   Procedure: ROBOTIC INCISIONAL HERNIA REPAIR;  Surgeon: Leighton Ruff, MD;  Location: [facility] ORS;  Service: General;  Laterality: N/A;  . VAGINAL DELIVERY    . VAGINAL HYSTERECTOMY      SOCIAL HISTORY: Social History   Socioeconomic History  . Marital status: Divorced    Spouse name: Not on file  . Number of children: Not on file  . Years of education: Not on file  . Highest education level: Not on file  Occupational History  . Not on file  Tobacco Use  . Smoking status: Never  Smoker  . Smokeless tobacco: Never Used  Substance and Sexual Activity  . Alcohol use: Yes    Comment: rarely - Holidays  . Drug use: No  . Sexual activity: Never  Other Topics Concern  . Not on file  Social History Narrative   Exercise- aerobic exercise at home 3-4x week.       Lives in [place]. No pets.   Social Determinants of Health   Financial Resource Strain: Low Risk   . Difficulty of Paying Living Expenses: Not hard at all  Food Insecurity: No Food Insecurity  . Worried About Charity fundraiser in the Last Year: Never true  . Ran Out of Food in the Last Year: Never true  Transportation Needs: No Transportation Needs  . Lack of Transportation (Medical): No  . Lack of Transportation (Non-Medical): No  Physical Activity:   . Days of Exercise per Week: Not on file  . Minutes of Exercise per Session: Not on file  Stress: No Stress Concern Present  . Feeling of Stress : Not at all  Social Connections:   . Frequency of Communication with Friends and Family: Not on file  . Frequency of Social Gatherings with Friends and Family: Not on file  . Attends Religious Services: Not on file  . Active Member of Clubs or Organizations: Not  on file  . Attends Archivist Meetings: Not on file  . Marital Status: Not on file  Intimate Partner Violence:   . Fear of Current or Ex-Partner: Not on file  . Emotionally Abused: Not on file  . Physically Abused: Not on file  . Sexually Abused: Not on file    FAMILY HISTORY: Family History  Problem Relation Age of Onset  . Heart disease Father 52       massive MI  . Diabetes Father   . Bipolar disorder Sister   . Osteoporosis Sister   . Breast cancer Sister 69       currently 92  . Cancer Sister        breast  . Bipolar disorder Maternal Aunt   . Breast cancer Maternal Aunt        age at dx unknown; deceased 54s  . Colon cancer Maternal Grandfather 37       deceased 16  . Cancer Maternal Grandfather        breast    . Osteoporosis Mother   . Colon cancer Mother        age at dx unknown; deceased 21  . Cancer Maternal Grandmother        unk. type; deceased 29  . Breast cancer Paternal Aunt        deceased 13    ALLERGIES:  is allergic to codeine.  MEDICATIONS:  Current Outpatient Medications  Medication Sig Dispense Refill  . Docusate Calcium (STOOL SOFTENER PO) Take by mouth daily as needed.    . fluticasone (FLONASE) 50 MCG/ACT nasal spray Place 2 sprays into both nostrils daily. (Patient taking differently: Place 2 sprays into both nostrils daily as needed. ) 16 g 2  . losartan (COZAAR) 50 MG tablet Take 1 tablet (50 mg total) by mouth daily. 90 tablet 3  . metoprolol succinate (TOPROL-XL) 100 MG 24 hr tablet TAKE 1 TABLET DAILY 90 tablet 3  . Multiple Vitamin (MULTIVITAMIN WITH MINERALS) TABS tablet Take 1 tablet by mouth daily.    . Multiple Vitamins-Minerals (ICAPS AREDS 2 PO) Take 2 capsules by mouth daily.    . phenylephrine (SUDAFED PE) 10 MG TABS tablet Take 10 mg by mouth every 4 (four) hours as needed.    . potassium chloride (K-DUR) 10 MEQ tablet Take 1 tablet (10 mEq total) by mouth daily. With lasix. 30 tablet 2  . rivaroxaban (XARELTO) 10 MG TABS tablet Take 1 tablet (10 mg total) by mouth daily. 90 tablet 0  . topiramate (TOPAMAX) 25 MG tablet Take by mouth.    . topiramate (TOPAMAX) 50 MG tablet TAKE 1 TABLET TWICE DAILY 180 tablet 0   No current facility-administered medications for this visit.      Marland Kitchen  PHYSICAL EXAMINATION: ECOG PERFORMANCE STATUS: 0 - Asymptomatic Vitals:   10/31/19 0942  BP: 124/76  Pulse: (!) 53  Resp: 20  Temp: 97.7 F (36.5 C)   Filed Weights   10/31/19 0942  Weight: 229 lb 9.6 oz (104.1 kg)   Physical Exam Constitutional:      General: She is not in acute distress.    Appearance: She is well-developed.  HENT:     Head: Normocephalic and atraumatic.     Mouth/Throat:     Pharynx: No oropharyngeal exudate.  Eyes:     General: No  scleral icterus.    Conjunctiva/sclera: Conjunctivae normal.     Pupils: Pupils are equal, round, and reactive to light.  Cardiovascular:  Rate and Rhythm: Normal rate and regular rhythm.     Heart sounds: Normal heart sounds. No murmur.  Pulmonary:     Effort: Pulmonary effort is normal. No respiratory distress.     Breath sounds: Normal breath sounds. No stridor. No wheezing or rales.  Chest:     Chest wall: No tenderness.  Abdominal:     General: Bowel sounds are normal. There is no distension.     Palpations: Abdomen is soft. There is no mass.     Tenderness: There is no abdominal tenderness. There is no guarding.  Musculoskeletal:        General: No swelling or deformity. Normal range of motion.     Cervical back: Normal range of motion and neck supple.  Lymphadenopathy:     Cervical: No cervical adenopathy.  Skin:    General: Skin is warm and dry.     Findings: No erythema or rash.     Comments: Medi-port.   Neurological:     Mental Status: She is alert and oriented to person, place, and time.     Cranial Nerves: No cranial nerve deficit.     Coordination: Coordination normal.  Psychiatric:        Behavior: Behavior normal.        Thought Content: Thought content normal.     LABORATORY DATA:  I have reviewed the data as listed Lab Results  Component Value Date   WBC 4.2 10/31/2019   HGB 11.9 (L) 10/31/2019   HCT 37.8 10/31/2019   MCV 92.9 10/31/2019   PLT 206 10/31/2019   Recent Labs    05/21/19 1525 05/24/19 1431 08/02/19 0917 08/12/19 1503 10/31/19 0920  NA 141  --  141 139 142  K 4.2  --  3.8 4.2 4.2  CL 108  --  110 108 112*  CO2 25  --  21* 24 23  GLUCOSE 131*  --  135* 79 123*  BUN 17  --  '18 17 18  ' CREATININE 1.02*   < > 0.92 0.89 0.89  CALCIUM 9.2  --  8.7* 9.3 9.2  GFRNONAA 53*  --  60*  --  >60  GFRAA >60  --  >60  --  >60  PROT 6.8  --  6.3*  --  6.7  ALBUMIN 3.7  --  3.5  --  3.6  AST 23  --  23  --  22  ALT 16  --  16  --  17    ALKPHOS 103  --  97  --  104  BILITOT 0.6  --  0.5  --  0.7   < > = values in this interval not displayed.    RADIOGRAPHIC STUDIES: I have personally reviewed the radiological images as listed and agreed with the findings in the report. 04/12/2017 CT abdomen pelvis w contrast Large mass arising from the sigmoid colon. This mass extends beyond the wall of the colon toward the left into the surrounding fat. This lesion does not reach the left pelvic sidewall.No adenopathy or liver lesions evident. No omental lesions appreciable. Sizable midline ventral hernia slightly superior to theumbilicus containing fat and a loop of colon without bowel compromise. Much smaller midline umbilical ventral hernia containingonly fat. There is a small hiatal hernia. No bowel obstruction.  No abscess.  Appendix region appears normal. No renal or ureteral calculus.  No hydronephrosis.There is aortoiliac atherosclerosis. There are foci of coronary artery calcification.  RADIOGRAPHIC STUDIES: I have personally reviewed  the radiological images as listed and agreed with the findings in the report. 07/31/2018  No evidence of recurrent or metastatic carcinoma, or other significant abnormality.  Aortic and coronary artery atherosclerosis incidentally noted  01/24/2019 CT chest abdomen pelvis with contrast showed a stable exam.  No evidence of recurrent or metastatic carcinoma within the chest, abdomen, or pelvis.  Aortic coronary artery atherosclerosis. Stable 4 mm pulmonary nodule  ASSESSMENT & PLAN:  78 y.o.female who has Stage IIIB colon cancer.  Cancer Staging Cancer of descending colon [facility]) Staging form: Colon and Rectum, AJCC 8th Edition - Pathologic: Stage IIIB (pT4a, pN1, cM0) - Signed by Earlie Server, MD on 04/30/2018  1. History of colon cancer   2. Deep vein thrombosis (DVT) of tibial vein of right lower extremity, unspecified chronicity ([place])   3. Port-A-Cath in place   4. Thyroid nodule    # History  of colon cancer - diagnosed 06/07/2017, finished all treatment April 2019.  Clinically she is doing well. CEA has been followed and has been stable. Today's CEA is pending. Surveillance CT scan every 6 months for the first 2 years.  She is due for another surveillance CT in 3 months.  Will obtain.   #Port-A-Cath in place, patient desires to have port removed if she remains in remission after 2 years. Will discuss after her next CT scan. Continue port flush every 6 to 8 weeks.  # #Right below knee age indeterminate DVT, Currently on long-term anticoagulation maintenance with Xarelto 10 mg daily.  She tolerates well.  Continue.  Refill sent to pharmacy  #Thyroid nodule, incidental finding from CT surveillance for colon cancer. Discussed with patient's primary care provider [name].  Patient to follow-up with PCP for further evaluation with ultrasound thyroid.    Orders Placed This Encounter  Procedures  . CT Chest W Contrast    Standing Status:   Future    Standing Expiration Date:   10/30/2020    Order Specific Question:   ** REASON FOR EXAM (FREE TEXT)    Answer:   history of colon cancer, follow up    Order Specific Question:   If indicated for the ordered procedure, I authorize the administration of contrast media per Radiology protocol    Answer:   Yes    Order Specific Question:   Preferred imaging location?    Answer:   [facility]    Order Specific Question:   Radiology Contrast Protocol - do NOT remove file path    Answer:   \\[email or web address]  . CT Abdomen Pelvis W Contrast    Standing Status:   Future    Standing Expiration Date:   10/30/2020    Order Specific Question:   ** REASON FOR EXAM (FREE TEXT)    Answer:   history of colon ca    Order Specific Question:   If indicated for the ordered procedure, I authorize the administration of contrast media per Radiology protocol    Answer:   Yes    Order Specific Question:   Preferred imaging  location?    Answer:   [facility]    Order Specific Question:   Is Oral Contrast requested for this exam?    Answer:   Yes, Per Radiology protocol    Order Specific Question:   Radiology Contrast Protocol - do NOT remove file path    Answer:   \\[email or web address]  . CBC with Differential/Platelet    Standing Status:   Future    Standing Expiration Date:  10/30/2020  . Comprehensive metabolic panel    Standing Status:   Future    Standing Expiration Date:   10/30/2020  . CEA    Standing Status:   Future    Standing Expiration Date:   10/30/2020    Return of visit: 3 months after CT scan.   Earlie Server, MD, PhD Hematology Oncology [facility] at [facility] Pager- [phone] 10/31/2019

## 2019-11-01 LAB — CEA: CEA: 1.5 ng/mL (ref 0.0–4.7)

## 2019-11-05 ENCOUNTER — Inpatient Hospital Stay: Payer: PPO

## 2019-11-05 ENCOUNTER — Other Ambulatory Visit: Payer: Self-pay

## 2019-11-05 DIAGNOSIS — Z95828 Presence of other vascular implants and grafts: Secondary | ICD-10-CM

## 2019-11-05 DIAGNOSIS — C186 Malignant neoplasm of descending colon: Secondary | ICD-10-CM | POA: Diagnosis not present

## 2019-11-05 MED ORDER — SODIUM CHLORIDE 0.9% FLUSH
10.0000 mL | Freq: Once | INTRAVENOUS | Status: AC
  Administered 2019-11-05: 13:00:00 10 mL via INTRAVENOUS
  Filled 2019-11-05: qty 10

## 2019-11-05 MED ORDER — HEPARIN SOD (PORK) LOCK FLUSH 100 UNIT/ML IV SOLN
500.0000 [IU] | Freq: Once | INTRAVENOUS | Status: AC
  Administered 2019-11-05: 13:00:00 500 [IU] via INTRAVENOUS
  Filled 2019-11-05: qty 5

## 2019-11-19 ENCOUNTER — Other Ambulatory Visit: Payer: Self-pay | Admitting: Family

## 2019-11-19 ENCOUNTER — Ambulatory Visit
Admission: RE | Admit: 2019-11-19 | Discharge: 2019-11-19 | Disposition: A | Discharge status: Home / Self Care | Payer: PPO | Source: Ambulatory Visit | Attending: Family | Admitting: Family

## 2019-11-19 DIAGNOSIS — Z1231 Encounter for screening mammogram for malignant neoplasm of breast: Secondary | ICD-10-CM | POA: Insufficient documentation

## 2019-11-19 DIAGNOSIS — R928 Other abnormal and inconclusive findings on diagnostic imaging of breast: Secondary | ICD-10-CM

## 2019-11-19 DIAGNOSIS — N6310 Unspecified lump in the right breast, unspecified quadrant: Secondary | ICD-10-CM

## 2019-11-20 ENCOUNTER — Other Ambulatory Visit: Payer: Self-pay

## 2019-11-20 ENCOUNTER — Ambulatory Visit (INDEPENDENT_AMBULATORY_CARE_PROVIDER_SITE_OTHER): Payer: PPO

## 2019-11-20 ENCOUNTER — Encounter: Payer: Self-pay | Admitting: Family

## 2019-11-20 VITALS — Ht 65.0 in | Wt 229.0 lb

## 2019-11-20 DIAGNOSIS — Z0000 Encounter for general adult medical examination without abnormal findings: Secondary | ICD-10-CM

## 2019-11-20 NOTE — Patient Instructions (Addendum)
  [name] , Thank you for taking time to come for your Medicare Wellness Visit. I appreciate your ongoing commitment to your health goals. Please review the following plan we discussed and let me know if I can assist you in the future.   These are the goals we discussed: Goals      Patient Stated   . Weight (lb) < 229 lb (103.9 kg) (pt-stated)     I want to eat healthier by making a plan, regulating my diet and monitor portion control.       This is a list of the screening recommended for you and due dates:  Health Maintenance  Topic Date Due  . Colon Cancer Screening  05/22/2021  . Tetanus Vaccine  08/17/2026  . DEXA scan (bone density measurement)  Completed  . Pneumonia vaccines  Completed  . Flu Shot  Discontinued

## 2019-11-20 NOTE — Progress Notes (Addendum)
Subjective:   VON PEAN is a 79 y.o. female who presents for Medicare Annual (Subsequent) preventive examination.  Review of Systems:  No ROS.  Medicare Wellness Virtual Visit.  Visual/audio telehealth visit, UTA vital signs.   Wt/Ht provided.  See social history for additional risk factors.   Cardiac Risk Factors include: advanced age (>42men, >58 women);hypertension     Objective:     Vitals: Ht 5\' 5"  (1.651 m)   Wt 229 lb (103.9 kg)   BMI 38.11 kg/m   Body mass index is 38.11 kg/m.  Advanced Directives 11/20/2019 10/30/2019 08/02/2019 05/21/2019 04/17/2019 01/28/2019 11/16/2018  Does Patient Have a Medical Advance Directive? Yes Yes Yes Yes Yes Yes Yes  Type of Paramedic of [place];Living will [place];Living will [place];Living will Living will;Healthcare Power of [place];Living will [place];Living will [place];Living will  Does patient want to make changes to medical advance directive? No - Patient declined No - Patient declined - - No - Patient declined - No - Patient declined  Copy of [place] in Chart? No - copy requested No - copy requested - No - copy requested No - copy requested No - copy requested No - copy requested  Would patient like information on creating a medical advance directive? - - - - - - -    Tobacco Social History   Tobacco Use  Smoking Status Never Smoker  Smokeless Tobacco Never Used     Counseling given: Not Answered   Clinical Intake:  Pre-visit preparation completed: Yes        Diabetes: No  How often do you need to have someone help you when you read instructions, pamphlets, or other written materials from your doctor or pharmacy?: 1 - Never  Interpreter Needed?: No     Past Medical History:  Diagnosis Date  . Arthritis    bitateral knees and left foot,s/p cortisone  injection in past  . Chronic constipation   . Colon cancer ([place]) 2018   pt is undergoing chemo 11-09-17  . Complication of anesthesia     slow to wake up   . Endometrial cancer ([place]) 2014   Total Hysterectomy and Rad tx's.   . Family history of cancer   . Genetic testing 09/26/2017    Multi-Cancer panel (83 genes) @ [facility] - No pathogenic mutations detected  . GERD (gastroesophageal reflux disease)   . Hypertension   . IBS (irritable bowel syndrome)   . Macular degeneration of both eyes   . Menopause    age 31  . PONV (postoperative nausea and vomiting)   . Vitamin D deficiency 06/07/10   Past Surgical History:  Procedure Laterality Date  . BUNIONECTOMY    . CATARACT EXTRACTION W/PHACO Left 09/24/2018   Procedure: CATARACT EXTRACTION PHACO AND INTRAOCULAR LENS PLACEMENT ([place]) LEFT;  Surgeon: Eulogio Bear, MD;  Location: [facility] [facility];  Service: Ophthalmology;  Laterality: Left;  . CATARACT EXTRACTION W/PHACO Right 10/15/2018   Procedure: CATARACT EXTRACTION PHACO AND INTRAOCULAR LENS PLACEMENT ([place]) RIGHT;  Surgeon: Eulogio Bear, MD;  Location: [place] [facility];  Service: Ophthalmology;  Laterality: Right;  . cataract surgery     both eyes  . COLONOSCOPY WITH PROPOFOL N/A 04/04/2017   Procedure: COLONOSCOPY WITH PROPOFOL;  Surgeon: [name], MD;  Location[facility] END[facility]COPY;  Service: Endoscopy;  Laterality: N/A;  . COLONOSCOPY WITH PROPOFOL N/A 05/22/2018  Procedure: COLONOSCOPY WITH PROPOFOL;  Surgeon: [name], MD;  Location: [facility] ENDOSCOPY;  Service: Endoscopy;  Laterality: N/A;  . DILATION AND CURETTAGE OF UTERUS  1971  . FOOT SURGERY    . NASAL SINUS SURGERY    . PORTACATH PLACEMENT Right 07/12/2017   Procedure: INSERTION PORT-A-CATH;  Surgeon: Jules Husbands, MD;  Location: [facility]S;  Service: General;  Laterality: Right;  . ROBOTIC ASSISTED LAPAROSCOPIC VENTRAL/INCISIONAL HERNIA REPAIR N/A 06/07/2017   Procedure: ROBOTIC INCISIONAL HERNIA REPAIR;   Surgeon: Leighton Ruff, MD;  Location: [facility];  Service: General;  Laterality: N/A;  . VAGINAL DELIVERY    . VAGINAL HYSTERECTOMY     Family History  Problem Relation Age of Onset  . Heart disease Father 47       massive MI  . Diabetes Father   . Bipolar disorder Sister   . Osteoporosis Sister   . Breast cancer Sister 12       currently 61  . Cancer Sister        breast  . Bipolar disorder Maternal Aunt   . Breast cancer Maternal Aunt        age at dx unknown; deceased 52s  . Colon cancer Maternal Grandfather 8       deceased 34  . Cancer Maternal Grandfather        breast  . Osteoporosis Mother   . Colon cancer Mother        age at dx unknown; deceased 65  . Cancer Maternal Grandmother        unk. type; deceased 19  . Breast cancer Paternal Aunt        deceased 29   Social History   Socioeconomic History  . Marital status: Divorced    Spouse name: Not on file  . Number of children: Not on file  . Years of education: Not on file  . Highest education level: Not on file  Occupational History  . Not on file  Tobacco Use  . Smoking status: Never Smoker  . Smokeless tobacco: Never Used  Substance and Sexual Activity  . Alcohol use: Yes    Comment: rarely - Holidays  . Drug use: No  . Sexual activity: Never  Other Topics Concern  . Not on file  Social History Narrative   Exercise- aerobic exercise at home 3-4x week.       Lives in [place]. No pets.   Social Determinants of Health   Financial Resource Strain: Low Risk   . Difficulty of Paying Living Expenses: Not hard at all  Food Insecurity: No Food Insecurity  . Worried About Charity fundraiser in the Last Year: Never true  . Ran Out of Food in the Last Year: Never true  Transportation Needs: No Transportation Needs  . Lack of Transportation (Medical): No  . Lack of Transportation (Non-Medical): No  Physical Activity:   . Days of Exercise per Week: Not on file  . Minutes of Exercise per Session: Not  on file  Stress: No Stress Concern Present  . Feeling of Stress : Not at all  Social Connections: Unknown  . Frequency of Communication with Friends and Family: More than three times a week  . Frequency of Social Gatherings with Friends and Family: More than three times a week  . Attends Religious Services: 1 to 4 times per year  . Active Member of Clubs or Organizations: Yes  . Attends Archivist Meetings: 1 to 4 times per year  .  Marital Status: Not on file    Outpatient Encounter Medications as of 11/20/2019  Medication Sig  . Docusate Calcium (STOOL SOFTENER PO) Take by mouth daily as needed.  . fluticasone (FLONASE) 50 MCG/ACT nasal spray Place 2 sprays into both nostrils daily. (Patient taking differently: Place 2 sprays into both nostrils daily as needed. )  . losartan (COZAAR) 50 MG tablet Take 1 tablet (50 mg total) by mouth daily.  . metoprolol succinate (TOPROL-XL) 100 MG 24 hr tablet TAKE 1 TABLET DAILY  . Multiple Vitamin (MULTIVITAMIN WITH MINERALS) TABS tablet Take 1 tablet by mouth daily.  . Multiple Vitamins-Minerals (ICAPS AREDS 2 PO) Take 2 capsules by mouth daily.  . phenylephrine (SUDAFED PE) 10 MG TABS tablet Take 10 mg by mouth every 4 (four) hours as needed.  . potassium chloride (K-DUR) 10 MEQ tablet Take 1 tablet (10 mEq total) by mouth daily. With lasix.  Marland Kitchen topiramate (TOPAMAX) 50 MG tablet TAKE 1 TABLET TWICE DAILY  . topiramate (TOPAMAX) 25 MG tablet Take by mouth.  . [DISCONTINUED] prochlorperazine (COMPAZINE) 10 MG tablet Take 1 tablet (10 mg total) by mouth every 6 (six) hours as needed (Nausea or vomiting). (Patient not taking: Reported on 01/28/2019)  . [DISCONTINUED] rivaroxaban (XARELTO) 10 MG TABS tablet Take 1 tablet (10 mg total) by mouth daily.   No facility-administered encounter medications on file as of 11/20/2019.    Activities of Daily Living In your present state of health, do you have any difficulty performing the following  activities: 11/20/2019  Hearing? Y  Comment Hearing aid  Vision? N  Difficulty concentrating or making decisions? N  Walking or climbing stairs? N  Dressing or bathing? N  Doing errands, shopping? N  Preparing Food and eating ? N  Using the Toilet? N  In the past six months, have you accidently leaked urine? N  Do you have problems with loss of bowel control? N  Managing your Medications? N  Managing your Finances? N  Housekeeping or managing your Housekeeping? N  Some recent data might be hidden    Patient Care Team: Burnard Hawthorne, FNP as PCP - General (Family Medicine) Vladimir Crofts, MD (Neurology) Noreene Filbert, MD as Referring Physician (Radiation Oncology) Leighton Ruff, MD as Consulting Physician (General Surgery) Earlie Server, MD as Consulting Physician (Oncology) Jules Husbands, MD as Consulting Physician (General Surgery)    Assessment:   This is a routine wellness examination for [place].  Nurse connected with patient 11/20/19 at  2:30 PM EST by a telephone enabled telemedicine application and verified that I am speaking with the correct person using two identifiers. Patient stated full name and DOB. Patient gave permission to continue with virtual visit. Patient's location was at home and Nurse's location was at [place] office.   Patient is alert and oriented x3. Patient notes some difficulty remembering.  Brain engagement activities mailed today.  Patient likes to read for brain stimulation.   Health Maintenance Due: See completed HM at the end of note.   Eye: Visual acuity not assessed. Virtual visit. Followed by their ophthalmologist.  Dental: Visits every 6 months.    Hearing: Hearing aids- yes  Safety:  Patient feels safe at home- yes Patient does have smoke detectors at home- yes Patient does wear sunscreen or protective clothing when in direct sunlight - yes Patient does wear seat belt when in a moving vehicle - yes Patient drives- yes Adequate  lighting in walkways free from debris- yes Grab bars and  handrails used as appropriate- yes Ambulates with an assistive device- no Cell phone on person when ambulating outside of the home- yes  Social: Alcohol intake - yes      Smoking history- never  Smokers in home? none Illicit drug use? none  Medication: Taking as directed and without issues.  Self managed - yes   Covid-19: Precautions and sickness symptoms discussed. Wears mask, social distancing, hand hygiene as appropriate.   Activities of Daily Living Patient denies needing assistance with: household chores, feeding themselves, getting from bed to chair, getting to the toilet, bathing/showering, dressing, managing money, or preparing meals.   Discussed the importance of a healthy diet, water intake and the benefits of aerobic exercise.  Educational material provided.  Physical activity- walking around the home, active at home.  Diet:  Regular Water: good intake Caffeine: none  Other Providers Patient Care Team: Burnard Hawthorne, FNP as PCP - General (Family Medicine) Vladimir Crofts, MD (Neurology) Noreene Filbert, MD as Referring Physician (Radiation Oncology) Leighton Ruff, MD as Consulting Physician (General Surgery) Earlie Server, MD as Consulting Physician (Oncology) Jules Husbands, MD as Consulting Physician (General Surgery)  Exercise Activities and Dietary recommendations Current Exercise Habits: Home exercise routine, Type of exercise: walking, Intensity: Mild  Goals      Patient Stated   . Weight (lb) < 229 lb (103.9 kg) (pt-stated)     I want to eat healthier by making a plan, regulating my diet and monitor portion control.       Fall Risk Fall Risk  11/20/2019 11/16/2018 04/19/2018 11/15/2017 10/09/2017  Falls in the past year? 0 0 No No No  Number falls in past yr: - - - - -  Injury with Fall? - - - - -  Follow up Falls prevention discussed - - - -   Timed Get Up and Go performed: no, virtual  visit  Depression Screen PHQ 2/9 Scores 11/20/2019 11/16/2018 04/19/2018 11/15/2017  PHQ - 2 Score 0 0 0 1  PHQ- 9 Score - - - 2     Cognitive Function MMSE - Mini Mental State Exam 11/15/2017 04/15/2015  Orientation to time 5 5  Orientation to Place 5 5  Registration 3 3  Attention/ Calculation 5 5  Recall 2 3  Language- name 2 objects 2 2  Language- repeat 1 1  Language- follow 3 step command 3 3  Language- read & follow direction 1 1  Language-read & follow direction-comments - Patient loves to read.  Write a sentence 1 1  Copy design 1 1  Total score 29 30     6CIT Screen 11/20/2019 11/16/2018 11/15/2016  What Year? 0 points 0 points 0 points  What month? 0 points 0 points 0 points  What time? 0 points 0 points 0 points  Count back from 20 0 points 0 points 0 points  Months in reverse 0 points 0 points 0 points  Repeat phrase 0 points 0 points 0 points  Total Score 0 0 0    Immunization History  Administered Date(s) Administered  . Pneumococcal Conjugate-13 04/15/2015  . Pneumococcal Polysaccharide-23 07/25/2013  . Tdap 08/17/2016  . Zoster 07/27/2011   Screening Tests Health Maintenance  Topic Date Due  . COLONOSCOPY  05/22/2021  . TETANUS/TDAP  08/17/2026  . DEXA SCAN  Completed  . PNA vac Low Risk Adult  Completed  . INFLUENZA VACCINE  Discontinued      Plan:    Keep all routine maintenance appointments.  Medicare Attestation I have personally reviewed: The patient's medical and social history Their use of alcohol, tobacco or illicit drugs Their current medications and supplements The patient's functional ability including ADLs,fall risks, home safety risks, cognitive, and hearing and visual impairment Diet and physical activities Evidence for depression   I have reviewed and discussed with patient certain preventive protocols, quality metrics, and best practice recommendations.     Varney Biles, LPN  [id number]   Agree with plan. [name],  NP

## 2019-11-25 DIAGNOSIS — H43813 Vitreous degeneration, bilateral: Secondary | ICD-10-CM | POA: Diagnosis not present

## 2019-11-25 DIAGNOSIS — H353190 Nonexudative age-related macular degeneration, unspecified eye, stage unspecified: Secondary | ICD-10-CM | POA: Diagnosis not present

## 2019-11-28 ENCOUNTER — Ambulatory Visit
Admission: RE | Admit: 2019-11-28 | Discharge: 2019-11-28 | Disposition: A | Discharge status: Home / Self Care | Payer: PPO | Source: Ambulatory Visit | Attending: Family | Admitting: Family

## 2019-11-28 ENCOUNTER — Other Ambulatory Visit: Payer: Self-pay | Admitting: Family

## 2019-11-28 DIAGNOSIS — R928 Other abnormal and inconclusive findings on diagnostic imaging of breast: Secondary | ICD-10-CM | POA: Insufficient documentation

## 2019-11-28 DIAGNOSIS — N6310 Unspecified lump in the right breast, unspecified quadrant: Secondary | ICD-10-CM

## 2019-11-28 DIAGNOSIS — R922 Inconclusive mammogram: Secondary | ICD-10-CM | POA: Diagnosis not present

## 2019-11-28 DIAGNOSIS — N6313 Unspecified lump in the right breast, lower outer quadrant: Secondary | ICD-10-CM | POA: Diagnosis not present

## 2019-11-29 ENCOUNTER — Encounter: Payer: Self-pay | Admitting: Family

## 2019-12-04 ENCOUNTER — Ambulatory Visit
Admission: RE | Admit: 2019-12-04 | Discharge: 2019-12-04 | Disposition: A | Discharge status: Home / Self Care | Payer: PPO | Source: Ambulatory Visit | Attending: Family | Admitting: Family

## 2019-12-04 DIAGNOSIS — R928 Other abnormal and inconclusive findings on diagnostic imaging of breast: Secondary | ICD-10-CM | POA: Insufficient documentation

## 2019-12-04 DIAGNOSIS — N6310 Unspecified lump in the right breast, unspecified quadrant: Secondary | ICD-10-CM

## 2019-12-04 DIAGNOSIS — C50311 Malignant neoplasm of lower-inner quadrant of right female breast: Secondary | ICD-10-CM | POA: Diagnosis not present

## 2019-12-04 DIAGNOSIS — N6313 Unspecified lump in the right breast, lower outer quadrant: Secondary | ICD-10-CM | POA: Diagnosis not present

## 2019-12-04 HISTORY — PX: BREAST BIOPSY: SHX20

## 2019-12-05 ENCOUNTER — Other Ambulatory Visit: Payer: Self-pay | Admitting: Anatomic Pathology & Clinical Pathology

## 2019-12-09 ENCOUNTER — Encounter: Payer: Self-pay | Admitting: Family

## 2019-12-09 ENCOUNTER — Encounter: Payer: Self-pay | Admitting: *Deleted

## 2019-12-09 ENCOUNTER — Other Ambulatory Visit: Payer: Self-pay

## 2019-12-09 LAB — SURGICAL PATHOLOGY

## 2019-12-09 NOTE — Progress Notes (Signed)
Patient pre screened for office appointment, no questions or concerns today. Patient reminded of upcoming appointment time and date. 

## 2019-12-09 NOTE — Progress Notes (Signed)
Called patient to establish navigation services.  She is an established patient of Dr.  Collie Siad for history of endometrial cancer, and most recently treated for colon cancer.  I have scheduled her to return to see Dr. Tasia Catchings tomorrow at 1:00.  Patient would like a female surgeon and the one who could see her the fastest.  I have scheduled her to see Dr. Celine Ahr tomorrow also at 3:15.  Will give patient her educational literature tomorrow.  She is to call with any questions or needs.

## 2019-12-10 ENCOUNTER — Encounter: Payer: Self-pay | Admitting: Oncology

## 2019-12-10 ENCOUNTER — Encounter: Payer: Self-pay | Admitting: *Deleted

## 2019-12-10 ENCOUNTER — Ambulatory Visit: Payer: Self-pay | Admitting: General Surgery

## 2019-12-10 ENCOUNTER — Ambulatory Visit (INDEPENDENT_AMBULATORY_CARE_PROVIDER_SITE_OTHER): Payer: PPO | Admitting: General Surgery

## 2019-12-10 ENCOUNTER — Encounter: Payer: Self-pay | Admitting: General Surgery

## 2019-12-10 ENCOUNTER — Other Ambulatory Visit: Payer: Self-pay

## 2019-12-10 ENCOUNTER — Inpatient Hospital Stay: Payer: PPO | Attending: Oncology | Admitting: Oncology

## 2019-12-10 VITALS — BP 145/83 | HR 55 | Temp 96.7°F | Resp 18

## 2019-12-10 VITALS — BP 156/90 | HR 54 | Temp 96.6°F | Ht 65.0 in

## 2019-12-10 DIAGNOSIS — C50511 Malignant neoplasm of lower-outer quadrant of right female breast: Secondary | ICD-10-CM | POA: Insufficient documentation

## 2019-12-10 DIAGNOSIS — I82441 Acute embolism and thrombosis of right tibial vein: Secondary | ICD-10-CM | POA: Diagnosis not present

## 2019-12-10 DIAGNOSIS — E041 Nontoxic single thyroid nodule: Secondary | ICD-10-CM | POA: Insufficient documentation

## 2019-12-10 DIAGNOSIS — Z7901 Long term (current) use of anticoagulants: Secondary | ICD-10-CM | POA: Insufficient documentation

## 2019-12-10 DIAGNOSIS — I10 Essential (primary) hypertension: Secondary | ICD-10-CM | POA: Diagnosis not present

## 2019-12-10 DIAGNOSIS — Z170 Estrogen receptor positive status [ER+]: Secondary | ICD-10-CM | POA: Insufficient documentation

## 2019-12-10 DIAGNOSIS — C50911 Malignant neoplasm of unspecified site of right female breast: Secondary | ICD-10-CM | POA: Diagnosis not present

## 2019-12-10 DIAGNOSIS — Z85038 Personal history of other malignant neoplasm of large intestine: Secondary | ICD-10-CM | POA: Diagnosis not present

## 2019-12-10 DIAGNOSIS — C186 Malignant neoplasm of descending colon: Secondary | ICD-10-CM | POA: Diagnosis not present

## 2019-12-10 DIAGNOSIS — Z9221 Personal history of antineoplastic chemotherapy: Secondary | ICD-10-CM | POA: Insufficient documentation

## 2019-12-10 DIAGNOSIS — E785 Hyperlipidemia, unspecified: Secondary | ICD-10-CM | POA: Diagnosis not present

## 2019-12-10 DIAGNOSIS — Z86718 Personal history of other venous thrombosis and embolism: Secondary | ICD-10-CM | POA: Diagnosis not present

## 2019-12-10 DIAGNOSIS — K219 Gastro-esophageal reflux disease without esophagitis: Secondary | ICD-10-CM | POA: Diagnosis not present

## 2019-12-10 DIAGNOSIS — Z923 Personal history of irradiation: Secondary | ICD-10-CM | POA: Insufficient documentation

## 2019-12-10 DIAGNOSIS — M1990 Unspecified osteoarthritis, unspecified site: Secondary | ICD-10-CM | POA: Insufficient documentation

## 2019-12-10 DIAGNOSIS — I824Y1 Acute embolism and thrombosis of unspecified deep veins of right proximal lower extremity: Secondary | ICD-10-CM | POA: Diagnosis not present

## 2019-12-10 DIAGNOSIS — Z79899 Other long term (current) drug therapy: Secondary | ICD-10-CM | POA: Insufficient documentation

## 2019-12-10 DIAGNOSIS — Z8542 Personal history of malignant neoplasm of other parts of uterus: Secondary | ICD-10-CM | POA: Insufficient documentation

## 2019-12-10 DIAGNOSIS — M7989 Other specified soft tissue disorders: Secondary | ICD-10-CM | POA: Diagnosis not present

## 2019-12-10 DIAGNOSIS — K589 Irritable bowel syndrome without diarrhea: Secondary | ICD-10-CM | POA: Insufficient documentation

## 2019-12-10 NOTE — H&P (View-Only) (Signed)
Patient ID: [name], female   DOB: Jul 27, 1941, 79 y.o.   MRN: [id number]  Chief Complaint  Patient presents with  . New Patient (Initial Visit)    new patient new breast cancer patient referred by cancer center requested female dr    HPI [name] is a 79 y.o. female.   She has been referred by the cancer center for surgical evaluation of right breast cancer.  She reports that she had her annual screening mammogram on January 5.  There were findings in the right breast that led to a dedicated diagnostic mammogram and ultrasound.  A 6 x 5 x 7 mm lobular mass was identified at the 7 o'clock position about 3 cm from the nipple.  The axilla did not show any pathologic lymphadenopathy.  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  The pathology report is copied here:   DIAGNOSIS:  A. BREAST, RIGHT AT 7:00, 3 CM FROM THE NIPPLE; ULTRASOUND-GUIDED CORE  BIOPSY:  - INVASIVE MAMMARY CARCINOMA, NO SPECIAL TYPE.   Size of invasive carcinoma: 7 mm in this sample  Histologic grade of invasive carcinoma: Grade 3            Glandular/tubular differentiation score: 3            Nuclear pleomorphism score: 2            Mitotic rate score: 3            Total score: 8  Ductal carcinoma in situ: Present, intermediate grade  Lymphovascular invasion: Not identified    GROSS DESCRIPTION:  A. Labeled: Ultrasound-guided right breast cores biopsy at 7 o'clock  position and 3 cm from nipple.  Received: In formalin  Time/date in fixative: Tissue procedure time 9:10 AM, tissue put in  formalin time 9:10 AM on 12/04/2019  Cold ischemic time: Less than 1 minute  Total fixation time: 8 hours  Core pieces: 4  Size: 1.1 cm in length and 0.1 cm in diameter each  Description: Fibrofatty soft tissue cores  Ink color: Black  Entirely submitted in 1  cassette.     ADDENDUM:  BREAST BIOMARKER TESTS  Estrogen Receptor (ER) Status: POSITIVE            Percentage of cells with nuclear positivity: >90%            Average intensity of staining: Strong   Progesterone Receptor (PgR) Status: POSITIVE            Percentage of cells with nuclear positivity: >90%            Average intensity of staining: Strong   HER2 (by immunohistochemistry): NEGATIVE (Score 0)   [name] has a fairly extensive personal history of cancer as well as family history of cancer.  She reports a sister, maternal aunt, and paternal aunt have all had breast cancer.  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  Age of menarche was 79 years old.  She had 2 pregnancies, the first at age 73.  She did not breast-feed either child.  She was briefly on oral contraceptive pills in her 23s but did not tolerate them and used an IUD for contraception.  She had a total hysterectomy in 2014 for endometrial carcinoma.  She has undergone genetic screening via [facility] without any pathologic mutations identified.  She denies having palpated  a lump.  No nipple discharge or skin changes.  No breast pain.  She states that she does not typically do self exams.  She has an appointment scheduled with Dr. Tasia Catchings, her oncologist, this afternoon.   Past Medical History:  Diagnosis Date  . Arthritis    bitateral knees and left foot,s/p cortisone injection in past  . Chronic constipation   . Colon cancer ([place]) 2018   pt is undergoing chemo 11-09-17  . Complication of anesthesia     slow to wake up   . Endometrial cancer ([place]) 2014   Total Hysterectomy and Rad tx's.   . Family history of cancer   . Genetic testing 09/26/2017    Multi-Cancer panel (83 genes) @ [facility] - No pathogenic mutations detected  . GERD (gastroesophageal reflux disease)   . Hypertension   . IBS (irritable bowel syndrome)   . Macular  degeneration of both eyes   . Menopause    age 46  . PONV (postoperative nausea and vomiting)   . Vitamin D deficiency 06/07/10    Past Surgical History:  Procedure Laterality Date  . BREAST BIOPSY Right 12/04/2019   [place] bx path pending  . BUNIONECTOMY    . CATARACT EXTRACTION W/PHACO Left 09/24/2018   Procedure: CATARACT EXTRACTION PHACO AND INTRAOCULAR LENS PLACEMENT ([place]) LEFT;  Surgeon: Eulogio Bear, MD;  Location: [facility];  Service: Ophthalmology;  Laterality: Left;  . CATARACT EXTRACTION W/PHACO Right 10/15/2018   Procedure: CATARACT EXTRACTION PHACO AND INTRAOCULAR LENS PLACEMENT ([place]) RIGHT;  Surgeon: Eulogio Bear, MD;  Location: [place] [facility];  Service: Ophthalmology;  Laterality: Right;  . cataract surgery     both eyes  . COLONOSCOPY WITH PROPOFOL N/A 04/04/2017   Procedure: COLONOSCOPY WITH PROPOFOL;  Surgeon: [name], MD;  Location: [facility] ENDOSCOPY;  Service: Endoscopy;  Laterality: N/A;  . COLONOSCOPY WITH PROPOFOL N/A 05/22/2018   Procedure: COLONOSCOPY WITH PROPOFOL;  Surgeon: [name], MD;  Location: [facility] ENDOSCOPY;  Service: Endoscopy;  Laterality: N/A;  . DILATION AND CURETTAGE OF UTERUS  1971  . FOOT SURGERY    . NASAL SINUS SURGERY    . PORTACATH PLACEMENT Right 07/12/2017   Procedure: INSERTION PORT-A-CATH;  Surgeon: Jules Husbands, MD;  Location: [facility]S;  Service: General;  Laterality: Right;  . ROBOTIC ASSISTED LAPAROSCOPIC VENTRAL/INCISIONAL HERNIA REPAIR N/A 06/07/2017   Procedure: ROBOTIC INCISIONAL HERNIA REPAIR;  Surgeon: Leighton Ruff, MD;  Location: [facility];  Service: General;  Laterality: N/A;  . VAGINAL DELIVERY    . VAGINAL HYSTERECTOMY      Family History  Problem Relation Age of Onset  . Heart disease Father 35       massive MI  . Diabetes Father   . Bipolar disorder Sister   . Osteoporosis Sister   . Breast cancer Sister 60       currently 22  . Cancer Sister        breast  . Bipolar disorder Maternal  Aunt   . Breast cancer Maternal Aunt        age at dx unknown; deceased 72s  . Colon cancer Maternal Grandfather 68       deceased 1  . Cancer Maternal Grandfather        breast  . Osteoporosis Mother   . Colon cancer Mother        age at dx unknown; deceased 41  . Cancer Maternal Grandmother        unk. type; deceased 62  .  Breast cancer Paternal Aunt        deceased 19    Social History Social History   Tobacco Use  . Smoking status: Never Smoker  . Smokeless tobacco: Never Used  Substance Use Topics  . Alcohol use: Yes    Comment: rarely - Holidays  . Drug use: No    Allergies  Allergen Reactions  . Codeine Nausea And Vomiting    Current Outpatient Medications  Medication Sig Dispense Refill  . Docusate Calcium (STOOL SOFTENER PO) Take by mouth daily as needed.    . fluticasone (FLONASE) 50 MCG/ACT nasal spray Place 2 sprays into both nostrils daily. (Patient taking differently: Place 2 sprays into both nostrils daily as needed. ) 16 g 2  . losartan (COZAAR) 50 MG tablet Take 1 tablet (50 mg total) by mouth daily. 90 tablet 3  . metoprolol succinate (TOPROL-XL) 100 MG 24 hr tablet TAKE 1 TABLET DAILY 90 tablet 3  . Multiple Vitamin (MULTIVITAMIN WITH MINERALS) TABS tablet Take 1 tablet by mouth daily.    . Multiple Vitamins-Minerals (ICAPS AREDS 2 PO) Take 2 capsules by mouth daily.    . phenylephrine (SUDAFED PE) 10 MG TABS tablet Take 10 mg by mouth every 4 (four) hours as needed.    . topiramate (TOPAMAX) 50 MG tablet TAKE 1 TABLET TWICE DAILY 180 tablet 0  . potassium chloride (K-DUR) 10 MEQ tablet Take 1 tablet (10 mEq total) by mouth daily. With lasix. (Patient not taking: Reported on 12/10/2019) 30 tablet 2  . topiramate (TOPAMAX) 25 MG tablet Take by mouth.     No current facility-administered medications for this visit.    Review of Systems Review of Systems  All other systems reviewed and are negative.   Blood pressure (!) 156/90, pulse (!) 54,  temperature (!) 96.6 F (35.9 C), temperature source Temporal, height '5\' 5"'  (1.651 m), SpO2 96 %.  Physical Exam Physical Exam Vitals reviewed. Exam conducted with a chaperone present.  Constitutional:      General: She is not in acute distress.    Appearance: She is obese.  HENT:     Head: Normocephalic and atraumatic.     Nose:     Comments: Covered with a mask secondary to COVID-19 precautions    Mouth/Throat:     Comments: Covered with a mask secondary to COVID-19 precautions Neck:     Comments: No dominant thyroid masses or thyromegaly appreciated. Cardiovascular:     Rate and Rhythm: Normal rate and regular rhythm.     Pulses: Normal pulses.  Pulmonary:     Effort: Pulmonary effort is normal.     Breath sounds: Normal breath sounds.  Chest:     Breasts:        Right: Normal.        Left: Normal.       Comments: Port-A-Cath in place Mild bruising in the area of the biopsy No skin changes No palpable masses Genitourinary:    Comments: Deferred Musculoskeletal:     Cervical back: Normal range of motion.     Right lower leg: Edema present.     Left lower leg: Edema present.     Comments: 1+ pitting edema to the midshin bilaterally  Lymphadenopathy:     Cervical: No cervical adenopathy.     Upper Body:     Right upper body: No supraclavicular, axillary or pectoral adenopathy.     Left upper body: No supraclavicular, axillary or pectoral adenopathy.  Skin:  General: Skin is warm and dry.  Neurological:     General: No focal deficit present.     Mental Status: She is alert and oriented to person, place, and time.  Psychiatric:        Mood and Affect: Mood normal.        Behavior: Behavior normal.     Data Reviewed I reviewed both the imaging itself as well as the reports of her screening mammogram and diagnostic studies.  I concur with the radiology findings as copied here:  CLINICAL DATA:  Patient recalled from screening for right  breast mass.  EXAM: DIGITAL DIAGNOSTIC RIGHT MAMMOGRAM WITH CAD AND TOMO  ULTRASOUND RIGHT BREAST  COMPARISON:  Previous exam(s).  ACR Breast Density Category c: The breast tissue is heterogeneously dense, which may obscure small masses.  FINDINGS: Within the posterior lower outer right breast there is a persistent high density lobular mass further evaluated with spot compression CC and MLO tomosynthesis images.  Mammographic images were processed with CAD.  On physical exam, no discrete mass is palpated within the lower outer right breast.  Targeted ultrasound is performed, showing a 6 x 5 x 7 mm lobular hypoechoic mass right breast 7 o'clock position 3 cm from nipple felt to correspond with mammographic abnormality.  No right axillary adenopathy.  IMPRESSION: Indeterminate right breast mass 7 o'clock position.  RECOMMENDATION: Ultrasound guided core needle biopsy indeterminate right breast mass 7 o'clock position.  Recommend correlation with post biopsy mammogram images to ensure this corresponds with the mammographically identified mass given the deep location.  ADDENDUM: PATHOLOGY revealed: A. BREAST, RIGHT AT 7:00, 3 CM FROM THE NIPPLE; ULTRASOUND-GUIDED CORE BIOPSY: - INVASIVE MAMMARY CARCINOMA, NO SPECIAL TYPE. 7 mm in this sample. Grade 3. Ductal carcinoma in situ: Present, intermediate grade. Lymphovascular invasion: Not identified.  Pathology results are CONCORDANT with imaging findings, per Dr. Lillia Mountain.  I reviewed the pathology, as copied in the history of present illness.  Assessment This is a 79 year old woman with past medical history notable for endometrial carcinoma and colon carcinoma, now diagnosed with a new invasive mammary carcinoma, ER/PR positive, HER-2 negative.  I have recommended that she undergo a wire localized lumpectomy with sentinel lymph node biopsy.  I did discuss with her the option of undergoing mastectomy, but  she prefers breast conservation.  I discussed the risks of the operation with her including, but not limited to, bleeding, infection, seroma, hematoma, incomplete excision requiring reoperation, need for axillary node dissection or other surgical intervention, breast asymmetry/unacceptable cosmetic outcome.  She has agreed to accept these risks and would like to proceed as soon as possible.  Plan We will schedule her for a wire localized lumpectomy with sentinel lymph node biopsy at the soonest possible date.    Fredirick Maudlin 12/10/2019, 11:28 AM

## 2019-12-10 NOTE — Progress Notes (Signed)
Met patient today during her medical oncology consult with Dr. Tasia Catchings.  Gave patient breast cancer educational literature, "My Breast Cancer Treatment Handbook" by Josephine Igo, RN.  Patient is to call me with her surgery date.  Dr. Tasia Catchings would like to send off her tissue for Oncotype DX testing.  She will follow up when those results are available.

## 2019-12-10 NOTE — Progress Notes (Signed)
Patient does not offer any problems today.  

## 2019-12-10 NOTE — Progress Notes (Signed)
Hematology/Oncology Follow Up visit. [place] [phone] [phone]  CONSULT NOTE Patient Care Team: Burnard Hawthorne, FNP as PCP - General (Family Medicine) Vladimir Crofts, MD (Neurology) Noreene Filbert, MD as Referring Physician (Radiation Oncology) Leighton Ruff, MD as Consulting Physician (General Surgery) Earlie Server, MD as Consulting Physician (Oncology) Jules Husbands, MD as Consulting Physician (General Surgery) Rico Junker, RN as Registered Nurse Theodore Demark, RN as Registered Nurse  PURPOSE OF CONSULTATION:  Follow up for blood clot, and stage IIIB colon cancer  HISTORY OF PRESENTING ILLNESS:  [name] 79 y.o.  female with PMH listed as below was referred to me for evaluation and management of colon cancer.  She had colonoscopy evaluation due to positive cologuard.  She was found to have a nonobstructing mass approximately 20 cm from the anal verge which biopsy proved to be adenocarcinoma. CT scan showed a large mass from the sigmoid and extends beyond the wall of the colon toward the left into the surrounding fat. Preoperational CEA was normal. Patient underwent robotic sigmoidectomy on 06/07/2017.   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 MLH1  loss of function, PMS2 loss of function. Declined option of clinical trial ATOMIC She has a history of endometrial cancer s/p hysterotomy and radiation. She has a family history of colon cancer and breast cancer.  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 Results have been explained to patient by genetic counselor.   # Thyroid nodule, incidental finding from CT surveillance for colon cancer. Discussed with patient's primary care provider [name].  Patient to follow-up with PCP for further  evaluation with ultrasound thyroid.   Oncology treatment S/p adjuvant chemotherapy finished February 2019 Adjuvant RT finished in April 2019.  05/22/2018 Colonoscopy surveillance by Dr.Wohl showed 10 mm polyp in ascending colon.  Removed.  Patent end-to-end colo-colonic anastomosis Pathology showed benign polyp.  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   INTERVAL HISTORY 79 yo female with above history presents for follow up per primary care provider for evaluation of newly diagnosed breast cancer.  Patient has history of colon cancer and history of unprovoked DVT. Patient had bilateral screening breast mammogram on 11/19/2019.  Right breast mass warrants further evaluation. Right diagnostic mammogram showed 6 x 5 x 7 mm lobular hypoechoic mass, 7:00, 3 cm from nipple.  No right axillary adenopathy ultrasound-guided core needle biopsy was obtained. Pathology showed invasive mammary carcinoma, no special type.  Grade 3, ER/PR strongly positive.  90%, HER-2 negative. Patient has establish care with Dr. Celine Ahr. Patient presented for evaluation and discussion of management plan. She feels well.  Denies any nipple discharge, skin changes. 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   Port-A-Cath, she gets port flush every 6 to 8 weeks.    Review of Systems  Constitutional: Negative for appetite change, chills, fatigue and fever.  HENT:   Negative for hearing loss and voice change.   Eyes: Negative for eye problems.  Respiratory: Negative for chest tightness and cough.   Cardiovascular: Negative for chest pain and leg swelling.  Gastrointestinal: Negative for abdominal distention, abdominal pain, blood in stool and diarrhea.  Endocrine: Negative for hot flashes.  Genitourinary: Negative for difficulty urinating and frequency.   Musculoskeletal: Negative for arthralgias.  Skin: Negative for itching and rash.  Neurological:  Negative for extremity weakness.  Hematological: Negative for adenopathy.  Psychiatric/Behavioral: Negative for confusion.   MEDICAL HISTORY:  Past Medical History:  Diagnosis Date  . Arthritis    bitateral knees and left foot,s/p cortisone injection in past  . Chronic constipation   . Colon cancer ([place]) 2018   pt is undergoing chemo 11-09-17  . Complication of anesthesia     slow to wake up   . Endometrial cancer ([place]) 2014   Total Hysterectomy and Rad tx's.   . Family history of cancer   . Genetic testing 09/26/2017    Multi-Cancer panel (83 genes) @ [facility] - No pathogenic mutations detected  . GERD (gastroesophageal reflux disease)   . Hypertension   . IBS (irritable bowel syndrome)   . Macular degeneration of both eyes   . Menopause    age 32  . PONV (postoperative nausea and vomiting)   . Vitamin D deficiency 06/07/10    SURGICAL HISTORY: Past Surgical History:  Procedure Laterality Date  . BREAST BIOPSY Right 12/04/2019   [place] bx path pending  . BUNIONECTOMY    . CATARACT EXTRACTION W/PHACO Left 09/24/2018   Procedure: CATARACT EXTRACTION PHACO AND INTRAOCULAR LENS PLACEMENT ([place]) LEFT;  Surgeon: Eulogio Bear, MD;  Location: [place];  Service: Ophthalmology;  Laterality: Left;  . CATARACT EXTRACTION W/PHACO Right 10/15/2018   Procedure: CATARACT EXTRACTION PHACO AND INTRAOCULAR LENS PLACEMENT ([place]) RIGHT;  Surgeon: Eulogio Bear, MD;  Location: [facility];  Service: Ophthalmology;  Laterality: Right;  . cataract surgery     both eyes  . COLONOSCOPY WITH PROPOFOL N/A 04/04/2017   Procedure: COLONOSCOPY WITH PROPOFOL;  Surgeon: [name], MD;  Location: [facility] ENDOSCOPY;  Service: Endoscopy;  Laterality: N/A;  . COLONOSCOPY WITH PROPOFOL N/A 05/22/2018   Procedure: COLONOSCOPY WITH PROPOFOL;  Surgeon: [name], MD;  Location[facility]COPY;  Service: Endoscopy;  Laterality: N/A;  . DILATION AND CURETTAGE OF UTERUS  1971  . FOOT SURGERY     . NASAL SINUS SURGERY    . PORTACATH PLACEMENT Right 07/12/2017   Procedure: INSERTION PORT-A-CATH;  Surgeon: Jules Husbands, MD;  Location: [facility]S;  Service: General;  Laterality: Right;  . ROBOTIC ASSISTED LAPAROSCOPIC VENTRAL/INCISIONAL HERNIA REPAIR N/A 06/07/2017   Procedure: ROBOTIC INCISIONAL HERNIA REPAIR;  Surgeon: Leighton Ruff, MD;  Location: [facility];  Service: General;  Laterality: N/A;  . VAGINAL DELIVERY    . VAGINAL HYSTERECTOMY      SOCIAL HISTORY: Social History   Socioeconomic History  . Marital status: Divorced    Spouse name: Not on file  . Number of children: Not on file  . Years of education: Not on file  . Highest education level: Not on file  Occupational History  . Not on file  Tobacco Use  . Smoking status: Never Smoker  . Smokeless tobacco: Never Used  Substance and Sexual Activity  . Alcohol use: Yes    Comment: rarely - Holidays  . Drug use: No  . Sexual activity: Never  Other Topics Concern  . Not on file  Social History Narrative   Exercise- aerobic exercise at home 3-4x week.       Lives in [place]. No pets.   Social Determinants of Health   Financial Resource Strain: Low Risk   . Difficulty of Paying Living Expenses: Not hard at all  Food Insecurity: No Food Insecurity  . Worried About Charity fundraiser  in the Last Year: Never true  . Ran Out of Food in the Last Year: Never true  Transportation Needs: No Transportation Needs  . Lack of Transportation (Medical): No  . Lack of Transportation (Non-Medical): No  Physical Activity:   . Days of Exercise per Week: Not on file  . Minutes of Exercise per Session: Not on file  Stress: No Stress Concern Present  . Feeling of Stress : Not at all  Social Connections: Unknown  . Frequency of Communication with Friends and Family: More than three times a week  . Frequency of Social Gatherings with Friends and Family: More than three times a week  . Attends Religious Services: 1 to 4  times per year  . Active Member of Clubs or Organizations: Yes  . Attends Archivist Meetings: 1 to 4 times per year  . Marital Status: Not on file  Intimate Partner Violence: Not At Risk  . Fear of Current or Ex-Partner: No  . Emotionally Abused: No  . Physically Abused: No  . Sexually Abused: No    FAMILY HISTORY: Family History  Problem Relation Age of Onset  . Heart disease Father 45       massive MI  . Diabetes Father   . Bipolar disorder Sister   . Osteoporosis Sister   . Breast cancer Sister 73       currently 4  . Cancer Sister        breast  . Bipolar disorder Maternal Aunt   . Breast cancer Maternal Aunt        age at dx unknown; deceased 73s  . Colon cancer Maternal Grandfather 26       deceased 51  . Cancer Maternal Grandfather        breast  . Osteoporosis Mother   . Colon cancer Mother        age at dx unknown; deceased 69  . Cancer Maternal Grandmother        unk. type; deceased 63  . Breast cancer Paternal Aunt        deceased 63    ALLERGIES:  is allergic to codeine.  MEDICATIONS:  Current Outpatient Medications  Medication Sig Dispense Refill  . Docusate Calcium (STOOL SOFTENER PO) Take by mouth daily as needed.    . fluticasone (FLONASE) 50 MCG/ACT nasal spray Place 2 sprays into both nostrils daily. (Patient taking differently: Place 2 sprays into both nostrils daily as needed. ) 16 g 2  . losartan (COZAAR) 50 MG tablet Take 1 tablet (50 mg total) by mouth daily. 90 tablet 3  . metoprolol succinate (TOPROL-XL) 100 MG 24 hr tablet TAKE 1 TABLET DAILY 90 tablet 3  . Multiple Vitamin (MULTIVITAMIN WITH MINERALS) TABS tablet Take 1 tablet by mouth daily.    . Multiple Vitamins-Minerals (ICAPS AREDS 2 PO) Take 2 capsules by mouth daily.    . phenylephrine (SUDAFED PE) 10 MG TABS tablet Take 10 mg by mouth every 4 (four) hours as needed.    . topiramate (TOPAMAX) 25 MG tablet Take by mouth.    . topiramate (TOPAMAX) 50 MG tablet TAKE 1  TABLET TWICE DAILY 180 tablet 0  . potassium chloride (K-DUR) 10 MEQ tablet Take 1 tablet (10 mEq total) by mouth daily. With lasix. (Patient not taking: Reported on 12/10/2019) 30 tablet 2   No current facility-administered medications for this visit.      Marland Kitchen  PHYSICAL EXAMINATION: ECOG PERFORMANCE STATUS: 0 - Asymptomatic Vitals:  12/10/19 1305  BP: (!) 145/83  Pulse: (!) 55  Resp: 18  Temp: (!) 96.7 F (35.9 C)   Filed Weights   Physical Exam Constitutional:      General: She is not in acute distress.    Appearance: She is well-developed.  HENT:     Head: Normocephalic and atraumatic.     Mouth/Throat:     Pharynx: No oropharyngeal exudate.  Eyes:     General: No scleral icterus.    Conjunctiva/sclera: Conjunctivae normal.     Pupils: Pupils are equal, round, and reactive to light.  Cardiovascular:     Rate and Rhythm: Normal rate and regular rhythm.     Heart sounds: Normal heart sounds. No murmur.  Pulmonary:     Effort: Pulmonary effort is normal. No respiratory distress.     Breath sounds: Normal breath sounds. No stridor. No wheezing or rales.  Chest:     Chest wall: No tenderness.  Abdominal:     General: Bowel sounds are normal. There is no distension.     Palpations: Abdomen is soft. There is no mass.     Tenderness: There is no abdominal tenderness. There is no guarding.  Musculoskeletal:        General: No swelling or deformity. Normal range of motion.     Cervical back: Normal range of motion and neck supple.  Lymphadenopathy:     Cervical: No cervical adenopathy.  Skin:    General: Skin is warm and dry.     Findings: No erythema or rash.     Comments: Medi-port.   Neurological:     Mental Status: She is alert and oriented to person, place, and time.     Cranial Nerves: No cranial nerve deficit.     Coordination: Coordination normal.  Psychiatric:        Behavior: Behavior normal.        Thought Content: Thought content normal.   Breast exam  was performed in seated and lying down position. No appreciable breast mass or axillary lymphadenopathy bilaterally.    LABORATORY DATA:  I have reviewed the data as listed Lab Results  Component Value Date   WBC 4.2 10/31/2019   HGB 11.9 (L) 10/31/2019   HCT 37.8 10/31/2019   MCV 92.9 10/31/2019   PLT 206 10/31/2019   Recent Labs    05/21/19 1525 05/24/19 1431 08/02/19 0917 08/12/19 1503 10/31/19 0920  NA 141   < > 141 139 142  K 4.2   < > 3.8 4.2 4.2  CL 108   < > 110 108 112*  CO2 25   < > 21* 24 23  GLUCOSE 131*   < > 135* 79 123*  BUN 17   < > _0 CREATININE 1.02*   < > 0.92 0.89 0.89  CALCIUM 9.2   < > 8.7* 9.3 9.2  GFRNONAA 53*  --  60*  --  >60  GFRAA >60  --  >60  --  >60  PROT 6.8  --  6.3*  --  6.7  ALBUMIN 3.7  --  3.5  --  3.6  AST 23  --  23  --  22  ALT 16  --  16  --  17  ALKPHOS 103  --  97  --  104  BILITOT 0.6  --  0.5  --  0.7   < > = values in this interval not displayed.    RADIOGRAPHIC STUDIES: I  have personally reviewed the radiological images as listed and agreed with the findings in the report. 04/12/2017 CT abdomen pelvis w contrast Large mass arising from the sigmoid colon. This mass extends beyond the wall of the colon toward the left into the surrounding fat. This lesion does not reach the left pelvic sidewall.No adenopathy or liver lesions evident. No omental lesions appreciable. Sizable midline ventral hernia slightly superior to theumbilicus containing fat and a loop of colon without bowel compromise. Much smaller midline umbilical ventral hernia containingonly fat. There is a small hiatal hernia. No bowel obstruction.  No abscess.  Appendix region appears normal. No renal or ureteral calculus.  No hydronephrosis.There is aortoiliac atherosclerosis. There are foci of coronary artery calcification.  RADIOGRAPHIC STUDIES: I have personally reviewed the radiological images as listed and agreed with the findings in the report. US  BREAST LTD UNI RIGHT INC AXILLA  Result Date: 11/28/2019 CLINICAL DATA:  Patient recalled from screening for right breast mass. EXAM: DIGITAL DIAGNOSTIC RIGHT MAMMOGRAM WITH CAD AND TOMO ULTRASOUND RIGHT BREAST COMPARISON:  Previous exam(s). ACR Breast Density Category c: The breast tissue is heterogeneously dense, which may obscure small masses. FINDINGS: Within the posterior lower outer right breast there is a persistent high density lobular mass further evaluated with spot compression CC and MLO tomosynthesis images. Mammographic images were processed with CAD. On physical exam, no discrete mass is palpated within the lower outer right breast. Targeted ultrasound is performed, showing a 6 x 5 x 7 mm lobular hypoechoic mass right breast 7 o'clock position 3 cm from nipple felt to correspond with mammographic abnormality. No right axillary adenopathy. IMPRESSION: Indeterminate right breast mass 7 o'clock position. RECOMMENDATION: Ultrasound guided core needle biopsy indeterminate right breast mass 7 o'clock position. Recommend correlation with post biopsy mammogram images to ensure this corresponds with the mammographically identified mass given the deep location. I have discussed the findings and recommendations with the patient. If applicable, a reminder letter will be sent to the patient regarding the next appointment. BI-RADS CATEGORY  4: Suspicious. Electronically Signed   By: Lovey Newcomer M.D.   On: 11/28/2019 10:36   MM DIAG BREAST TOMO UNI RIGHT  Result Date: 11/28/2019 CLINICAL DATA:  Patient recalled from screening for right breast mass. EXAM: DIGITAL DIAGNOSTIC RIGHT MAMMOGRAM WITH CAD AND TOMO ULTRASOUND RIGHT BREAST COMPARISON:  Previous exam(s). ACR Breast Density Category c: The breast tissue is heterogeneously dense, which may obscure small masses. FINDINGS: Within the posterior lower outer right breast there is a persistent high density lobular mass further evaluated with spot compression CC  and MLO tomosynthesis images. Mammographic images were processed with CAD. On physical exam, no discrete mass is palpated within the lower outer right breast. Targeted ultrasound is performed, showing a 6 x 5 x 7 mm lobular hypoechoic mass right breast 7 o'clock position 3 cm from nipple felt to correspond with mammographic abnormality. No right axillary adenopathy. IMPRESSION: Indeterminate right breast mass 7 o'clock position. RECOMMENDATION: Ultrasound guided core needle biopsy indeterminate right breast mass 7 o'clock position. Recommend correlation with post biopsy mammogram images to ensure this corresponds with the mammographically identified mass given the deep location. I have discussed the findings and recommendations with the patient. If applicable, a reminder letter will be sent to the patient regarding the next appointment. BI-RADS CATEGORY  4: Suspicious. Electronically Signed   By: Lovey Newcomer M.D.   On: 11/28/2019 10:36   MM 3D SCREEN BREAST BILATERAL  Result Date: 11/19/2019 CLINICAL DATA:  Screening.  EXAM: DIGITAL SCREENING BILATERAL MAMMOGRAM WITH TOMO AND CAD COMPARISON:  Previous exam(s). ACR Breast Density Category b: There are scattered areas of fibroglandular density. FINDINGS: In the right breast, a possible mass warrants further evaluation. In the left breast, no findings suspicious for malignancy. Images were processed with CAD. IMPRESSION: Further evaluation is suggested for possible mass in the right breast. RECOMMENDATION: Diagnostic mammogram and possibly ultrasound of the right breast. (Code:[id number]-[id number]) The patient will be contacted regarding the findings, and additional imaging will be scheduled. BI-RADS CATEGORY  0: Incomplete. Need additional imaging evaluation and/or prior mammograms for comparison. Electronically Signed   By: Margarette Canada M.D.   On: 11/19/2019 14:52   MM CLIP PLACEMENT RIGHT  Result Date: 12/04/2019 CLINICAL DATA:  Status post ultrasound-guided core biopsy of  a right breast mass. EXAM: DIAGNOSTIC RIGHT MAMMOGRAM POST ULTRASOUND BIOPSY COMPARISON:  Previous exam(s). FINDINGS: Mammographic images were obtained following ultrasound guided biopsy of a mass in the 7 o'clock region of the right breast. The biopsy marking clip is in the expected location at the posterior margin of the mass in the 7 o'clock region of the breast. IMPRESSION: Appropriate positioning of the coil shaped biopsy marking clip at the site of biopsy in the 7 o'clock region of the right breast. Final Assessment: Post Procedure Mammograms for Marker Placement Electronically Signed   By: Lillia Mountain M.D.   On: 12/04/2019 09:34   [place] RT BREAST BX W LOC DEV 1ST LESION IMG BX SPEC US GUIDE  Addendum Date: 12/09/2019   ADDENDUM REPORT: 12/09/2019 10:39 ADDENDUM: PATHOLOGY revealed: A. BREAST, RIGHT AT 7:00, 3 CM FROM THE NIPPLE; ULTRASOUND-GUIDED CORE BIOPSY: - INVASIVE MAMMARY CARCINOMA, NO SPECIAL TYPE. 7 mm in this sample. Grade 3. Ductal carcinoma in situ: Present, intermediate grade. Lymphovascular invasion: Not identified. Pathology results are CONCORDANT with imaging findings, per Dr. Lillia Mountain. Pathology results and recommendations below were discussed with patient by telephone on 12/09/2019. Patient reported biopsy site doing well with slight tenderness at the site. Post biopsy care instructions were reviewed and questions were answered. Patient was instructed to call [facility] if any concerns or questions arise related to the biopsy. Recommendation: Surgical referral. Request for surgical referral was relayed to nurse navigators at [facility] by Electa Sniff RN on 12/09/2019. Addendum by Electa Sniff RN on 12/09/2019. Electronically Signed   By: Lillia Mountain M.D.   On: 12/09/2019 10:39   Result Date: 12/09/2019 CLINICAL DATA:  Right breast mass. EXAM: ULTRASOUND GUIDED RIGHT BREAST CORE NEEDLE BIOPSY COMPARISON:  Previous exam(s). FINDINGS: I met with the patient and we  discussed the procedure of ultrasound-guided biopsy, including benefits and alternatives. We discussed the high likelihood of a successful procedure. We discussed the risks of the procedure, including infection, bleeding, tissue injury, clip migration, and inadequate sampling. Informed written consent was given. The usual time-out protocol was performed immediately prior to the procedure. Lesion quadrant: Lower outer quadrant Using sterile technique and 1% Lidocaine as local anesthetic, under direct ultrasound visualization, a 14 gauge spring-loaded device was used to perform biopsy of a mass in the 7 o'clock region of the right breast using a lateral to medial approach. At the conclusion of the procedure coil shaped tissue marker clip was deployed into the biopsy cavity. Follow up 2 view mammogram was performed and dictated separately. IMPRESSION: Ultrasound guided biopsy of the right breast. No apparent complications. Electronically Signed: By: Lillia Mountain M.D. On: 12/04/2019 09:19     ASSESSMENT &  PLAN:  79 y.o.female who has Stage IIIB colon cancer.,  History of DVT, presented for evaluation for newly diagnosed breast cancer. Cancer Staging Cancer of descending colon [facility]) Staging form: Colon and Rectum, AJCC 8th Edition - Pathologic: Stage IIIB (pT4a, pN1, cM0) - Signed by Earlie Server, MD on 04/30/2018  1. Malignant neoplasm of lower-outer quadrant of right breast of female, estrogen receptor positive ([place])   2. History of colon cancer   3. Deep vein thrombosis (DVT) of tibial vein of right lower extremity, unspecified chronicity ([facility])     #Breast cancer, cT1b cN0 ER/PR positive, HER-2 negative. I recommend lumpectomy with sentinel lymph node biopsy.  Dr. Glenford Peers note was reviewed.  Mastectomy was offered and the patient prefers breast conservation. Discussed with patient that I will send Oncotype DX for lymph node negative breast cancer for prediction of recurrence and if chemotherapy will  benefit her. If she does not need chemotherapy, she will need adjuvant radiation followed by antihormonal treatments. Patient's case was presented on tumor board on 12/09/2019 and consensus was reached as above plan.  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 Patient had a repeat ultrasound in September 2020 and DVT has resolved. Patient may discontinue Xarelto 3 days prior to the lumpectomy procedure.  She may resume 2 days after surgery.  # History of colon cancer - diagnosed 06/07/2017, finished all treatment April 2019.  Surveillance CT scan every 6 months for the first 2 years.  She is due for surveillance CT scan in March.   #Port-A-Cath in place, patient desires to have port removed if she remains in remission after 2 years. Continue port flush every 6 to 8 weeks.  No orders of the defined types were placed in this encounter.   Return of visit: 3 weeks after lumpectomy to go over pathology results, Oncotype DX and adjuvant treatment plan.   Earlie Server, MD, PhD Hematology Oncology [facility] at [facility] Pager- [phone] 12/10/2019

## 2019-12-10 NOTE — Progress Notes (Signed)
Patient ID: [name], female   DOB: July 07, 1941, 79 y.o.   MRN: [id number]  Chief Complaint  Patient presents with  . New Patient (Initial Visit)    new patient new breast cancer patient referred by cancer center requested female dr    HPI [name] is a 79 y.o. female.   She has been referred by the cancer center for surgical evaluation of right breast cancer.  She reports that she had her annual screening mammogram on January 5.  There were findings in the right breast that led to a dedicated diagnostic mammogram and ultrasound.  A 6 x 5 x 7 mm lobular mass was identified at the 7 o'clock position about 3 cm from the nipple.  The axilla did not show any pathologic lymphadenopathy.  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  The pathology report is copied here:   DIAGNOSIS:  A. BREAST, RIGHT AT 7:00, 3 CM FROM THE NIPPLE; ULTRASOUND-GUIDED CORE  BIOPSY:  - INVASIVE MAMMARY CARCINOMA, NO SPECIAL TYPE.   Size of invasive carcinoma: 7 mm in this sample  Histologic grade of invasive carcinoma: Grade 3            Glandular/tubular differentiation score: 3            Nuclear pleomorphism score: 2            Mitotic rate score: 3            Total score: 8  Ductal carcinoma in situ: Present, intermediate grade  Lymphovascular invasion: Not identified    GROSS DESCRIPTION:  A. Labeled: Ultrasound-guided right breast cores biopsy at 7 o'clock  position and 3 cm from nipple.  Received: In formalin  Time/date in fixative: Tissue procedure time 9:10 AM, tissue put in  formalin time 9:10 AM on 12/04/2019  Cold ischemic time: Less than 1 minute  Total fixation time: 8 hours  Core pieces: 4  Size: 1.1 cm in length and 0.1 cm in diameter each  Description: Fibrofatty soft tissue cores  Ink color: Black  Entirely submitted in 1  cassette.     ADDENDUM:  BREAST BIOMARKER TESTS  Estrogen Receptor (ER) Status: POSITIVE            Percentage of cells with nuclear positivity: >90%            Average intensity of staining: Strong   Progesterone Receptor (PgR) Status: POSITIVE            Percentage of cells with nuclear positivity: >90%            Average intensity of staining: Strong   HER2 (by immunohistochemistry): NEGATIVE (Score 0)   [name] has a fairly extensive personal history of cancer as well as family history of cancer.  She reports a sister, maternal aunt, and paternal aunt have all had breast cancer.  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  Age of menarche was 79 years old.  She had 2 pregnancies, the first at age 79.  She did not breast-feed either child.  She was briefly on oral contraceptive pills in her 18s but did not tolerate them and used an IUD for contraception.  She had a total hysterectomy in 2014 for endometrial carcinoma.  She has undergone genetic screening via [facility] without any pathologic mutations identified.  She denies having palpated  a lump.  No nipple discharge or skin changes.  No breast pain.  She states that she does not typically do self exams.  She has an appointment scheduled with Dr. Tasia Catchings, her oncologist, this afternoon.   Past Medical History:  Diagnosis Date  . Arthritis    bitateral knees and left foot,s/p cortisone injection in past  . Chronic constipation   . Colon cancer ([place]) 2018   pt is undergoing chemo 11-09-17  . Complication of anesthesia     slow to wake up   . Endometrial cancer ([place]) 2014   Total Hysterectomy and Rad tx's.   . Family history of cancer   . Genetic testing 09/26/2017    Multi-Cancer panel (83 genes) @ [facility] - No pathogenic mutations detected  . GERD (gastroesophageal reflux disease)   . Hypertension   . IBS (irritable bowel syndrome)   . Macular  degeneration of both eyes   . Menopause    age 9  . PONV (postoperative nausea and vomiting)   . Vitamin D deficiency 06/07/10    Past Surgical History:  Procedure Laterality Date  . BREAST BIOPSY Right 12/04/2019   [place] bx path pending  . BUNIONECTOMY    . CATARACT EXTRACTION W/PHACO Left 09/24/2018   Procedure: CATARACT EXTRACTION PHACO AND INTRAOCULAR LENS PLACEMENT ([place]) LEFT;  Surgeon: Eulogio Bear, MD;  Location: [place];  Service: Ophthalmology;  Laterality: Left;  . CATARACT EXTRACTION W/PHACO Right 10/15/2018   Procedure: CATARACT EXTRACTION PHACO AND INTRAOCULAR LENS PLACEMENT ([place]) RIGHT;  Surgeon: Eulogio Bear, MD;  Location: [place];  Service: Ophthalmology;  Laterality: Right;  . cataract surgery     both eyes  . COLONOSCOPY WITH PROPOFOL N/A 04/04/2017   Procedure: COLONOSCOPY WITH PROPOFOL;  Surgeon: [name], MD;  Location: [facility] ENDOSCOPY;  Service: Endoscopy;  Laterality: N/A;  . COLONOSCOPY WITH PROPOFOL N/A 05/22/2018   Procedure: COLONOSCOPY WITH PROPOFOL;  Surgeon: [name], MD;  Location: [facility] ENDOSCOPY;  Service: Endoscopy;  Laterality: N/A;  . DILATION AND CURETTAGE OF UTERUS  1971  . FOOT SURGERY    . NASAL SINUS SURGERY    . PORTACATH PLACEMENT Right 07/12/2017   Procedure: INSERTION PORT-A-CATH;  Surgeon: Jules Husbands, MD;  Location: [facility] ORS;  Service: General;  Laterality: Right;  . ROBOTIC ASSISTED LAPAROSCOPIC VENTRAL/INCISIONAL HERNIA REPAIR N/A 06/07/2017   Procedure: ROBOTIC INCISIONAL HERNIA REPAIR;  Surgeon: Leighton Ruff, MD;  Location: [facility];  Service: General;  Laterality: N/A;  . VAGINAL DELIVERY    . VAGINAL HYSTERECTOMY      Family History  Problem Relation Age of Onset  . Heart disease Father 49       massive MI  . Diabetes Father   . Bipolar disorder Sister   . Osteoporosis Sister   . Breast cancer Sister 40       currently 68  . Cancer Sister        breast  . Bipolar disorder Maternal  Aunt   . Breast cancer Maternal Aunt        age at dx unknown; deceased 79s  . Colon cancer Maternal Grandfather 49       deceased 11  . Cancer Maternal Grandfather        breast  . Osteoporosis Mother   . Colon cancer Mother        age at dx unknown; deceased 62  . Cancer Maternal Grandmother        unk. type; deceased 81  .  Breast cancer Paternal Aunt        deceased 84    Social History Social History   Tobacco Use  . Smoking status: Never Smoker  . Smokeless tobacco: Never Used  Substance Use Topics  . Alcohol use: Yes    Comment: rarely - Holidays  . Drug use: No    Allergies  Allergen Reactions  . Codeine Nausea And Vomiting    Current Outpatient Medications  Medication Sig Dispense Refill  . Docusate Calcium (STOOL SOFTENER PO) Take by mouth daily as needed.    . fluticasone (FLONASE) 50 MCG/ACT nasal spray Place 2 sprays into both nostrils daily. (Patient taking differently: Place 2 sprays into both nostrils daily as needed. ) 16 g 2  . losartan (COZAAR) 50 MG tablet Take 1 tablet (50 mg total) by mouth daily. 90 tablet 3  . metoprolol succinate (TOPROL-XL) 100 MG 24 hr tablet TAKE 1 TABLET DAILY 90 tablet 3  . Multiple Vitamin (MULTIVITAMIN WITH MINERALS) TABS tablet Take 1 tablet by mouth daily.    . Multiple Vitamins-Minerals (ICAPS AREDS 2 PO) Take 2 capsules by mouth daily.    . phenylephrine (SUDAFED PE) 10 MG TABS tablet Take 10 mg by mouth every 4 (four) hours as needed.    . topiramate (TOPAMAX) 50 MG tablet TAKE 1 TABLET TWICE DAILY 180 tablet 0  . potassium chloride (K-DUR) 10 MEQ tablet Take 1 tablet (10 mEq total) by mouth daily. With lasix. (Patient not taking: Reported on 12/10/2019) 30 tablet 2  . topiramate (TOPAMAX) 25 MG tablet Take by mouth.     No current facility-administered medications for this visit.    Review of Systems Review of Systems  All other systems reviewed and are negative.   Blood pressure (!) 156/90, pulse (!) 54,  temperature (!) 96.6 F (35.9 C), temperature source Temporal, height '5\' 5"'  (1.651 m), SpO2 96 %.  Physical Exam Physical Exam Vitals reviewed. Exam conducted with a chaperone present.  Constitutional:      General: She is not in acute distress.    Appearance: She is obese.  HENT:     Head: Normocephalic and atraumatic.     Nose:     Comments: Covered with a mask secondary to COVID-19 precautions    Mouth/Throat:     Comments: Covered with a mask secondary to COVID-19 precautions Neck:     Comments: No dominant thyroid masses or thyromegaly appreciated. Cardiovascular:     Rate and Rhythm: Normal rate and regular rhythm.     Pulses: Normal pulses.  Pulmonary:     Effort: Pulmonary effort is normal.     Breath sounds: Normal breath sounds.  Chest:     Breasts:        Right: Normal.        Left: Normal.       Comments: Port-A-Cath in place Mild bruising in the area of the biopsy No skin changes No palpable masses Genitourinary:    Comments: Deferred Musculoskeletal:     Cervical back: Normal range of motion.     Right lower leg: Edema present.     Left lower leg: Edema present.     Comments: 1+ pitting edema to the midshin bilaterally  Lymphadenopathy:     Cervical: No cervical adenopathy.     Upper Body:     Right upper body: No supraclavicular, axillary or pectoral adenopathy.     Left upper body: No supraclavicular, axillary or pectoral adenopathy.  Skin:  General: Skin is warm and dry.  Neurological:     General: No focal deficit present.     Mental Status: She is alert and oriented to person, place, and time.  Psychiatric:        Mood and Affect: Mood normal.        Behavior: Behavior normal.     Data Reviewed I reviewed both the imaging itself as well as the reports of her screening mammogram and diagnostic studies.  I concur with the radiology findings as copied here:  CLINICAL DATA:  Patient recalled from screening for right  breast mass.  EXAM: DIGITAL DIAGNOSTIC RIGHT MAMMOGRAM WITH CAD AND TOMO  ULTRASOUND RIGHT BREAST  COMPARISON:  Previous exam(s).  ACR Breast Density Category c: The breast tissue is heterogeneously dense, which may obscure small masses.  FINDINGS: Within the posterior lower outer right breast there is a persistent high density lobular mass further evaluated with spot compression CC and MLO tomosynthesis images.  Mammographic images were processed with CAD.  On physical exam, no discrete mass is palpated within the lower outer right breast.  Targeted ultrasound is performed, showing a 6 x 5 x 7 mm lobular hypoechoic mass right breast 7 o'clock position 3 cm from nipple felt to correspond with mammographic abnormality.  No right axillary adenopathy.  IMPRESSION: Indeterminate right breast mass 7 o'clock position.  RECOMMENDATION: Ultrasound guided core needle biopsy indeterminate right breast mass 7 o'clock position.  Recommend correlation with post biopsy mammogram images to ensure this corresponds with the mammographically identified mass given the deep location.  ADDENDUM: PATHOLOGY revealed: A. BREAST, RIGHT AT 7:00, 3 CM FROM THE NIPPLE; ULTRASOUND-GUIDED CORE BIOPSY: - INVASIVE MAMMARY CARCINOMA, NO SPECIAL TYPE. 7 mm in this sample. Grade 3. Ductal carcinoma in situ: Present, intermediate grade. Lymphovascular invasion: Not identified.  Pathology results are CONCORDANT with imaging findings, per Dr. Lillia Mountain.  I reviewed the pathology, as copied in the history of present illness.  Assessment This is a 79 year old woman with past medical history notable for endometrial carcinoma and colon carcinoma, now diagnosed with a new invasive mammary carcinoma, ER/PR positive, HER-2 negative.  I have recommended that she undergo a wire localized lumpectomy with sentinel lymph node biopsy.  I did discuss with her the option of undergoing mastectomy, but  she prefers breast conservation.  I discussed the risks of the operation with her including, but not limited to, bleeding, infection, seroma, hematoma, incomplete excision requiring reoperation, need for axillary node dissection or other surgical intervention, breast asymmetry/unacceptable cosmetic outcome.  She has agreed to accept these risks and would like to proceed as soon as possible.  Plan We will schedule her for a wire localized lumpectomy with sentinel lymph node biopsy at the soonest possible date.    Fredirick Maudlin 12/10/2019, 11:28 AM

## 2019-12-10 NOTE — Patient Instructions (Addendum)
Dr.Cannon discussed the different treatment options with patient at today's visit.  Our surgery scheduler Marzetta Board will contact you within the next 24-48 hours to discuss surgery dates and the details of the prep for surgery. Please have the BLUE sheet available when scheduler Stacy contacts you.  Breast Cancer, Female  Breast cancer is a malignant growth of tissue (tumor) in the breast. Unlike noncancerous (benign) tumors, malignant tumors are cancerous and can spread to other parts of the body. The two most common types of breast cancer start in the milk ducts (ductal carcinoma) or in the lobules where milk is made in the breast (lobular carcinoma). Breast cancer is one of the most common types of cancer in women. What are the causes? The exact cause of female breast cancer is unknown. What increases the risk? The following factors may make you more likely to develop this condition:  Being older than 79 years of age.  Race and ethnicity. Caucasian women generally have an increased risk, but African-American women are more likely to develop the disease before age 30.  Having a family history of breast cancer.  Having had breast cancer in the past.  Having certain noncancerous conditions of the breast, such as dense breast tissue.  Having the BRCA1 and BRCA2 genes.  Having a history of radiation exposure.  Obesity.  Starting menopause after age 66.  Starting your menstrual periods before age 17.  Having never been pregnant or having your first child after age 5.  Having never breastfed.  Using hormone therapy after menopause.  Using birth control pills.  Drinking more than one alcoholic drink a day.  Exposure to the drug DES, which was given to pregnant women from the 1940s to the 1970s. What are the signs or symptoms? Symptoms of this condition include:  A painless lump or thickening in your breast.  Changes in the size or shape of your breast.  Breast skin changes,  such as puckering or dimpling.  Nipple abnormalities, such as scaling, crustiness, redness, or pulling in (retraction).  Nipple discharge that is bloody or clear. How is this diagnosed? This condition may be diagnosed by:  Taking your medical history and doing a physical exam. During the exam, your health care provider will feel the tissue around your breast and under your arms.  Taking a sample of nipple discharge. The sample will be examined under a microscope.  Performing imaging tests, such as breast X-rays (mammogram), breast ultrasound exams, or an MRI.  Taking a tissue sample (biopsy) from the breast. The sample will be examined under a microscope to look for cancer cells.  Taking a sample from the lymph nodes near the affected breast (sentinel node biopsy). Your cancer will be staged to determine its severity and extent. Staging is a careful attempt to find out the size of the tumor, whether the cancer has spread, and if so, to what parts of the body. Staging also includes testing your tumor for certain receptors, such as estrogen, progesterone, and human epidermal growth factor receptor 2 (HER2). This will help your cancer care team decide on a treatment that will work best for you. You may need to have more tests to determine the stage of your cancer. Stages include the following:  Stage 0--The tumor has not spread to other breast tissue.  Stage I--The cancer is only found in the breast or may be in the lymph nodes. The tumor may be up to  in (2 cm) wide.  Stage II--The cancer has  spread to nearby lymph nodes. The tumor may be up to 2 in (5 cm) wide.  Stage III--The cancer has spread to more distant lymph nodes. The tumor may be larger than 2 in (5 cm) wide.  Stage IV--The cancer has spread to other parts of the body, such as the bones, brain, liver, or lungs. How is this treated? Treatment for this condition depends on the type and stage of the breast cancer. It may be  treated with:  Surgery. This may involve breast-conserving surgery (lumpectomy or partial mastectomy) in which only the part of the breast containing the cancer is removed. Some normal tissue surrounding this area may also be removed. In some cases, surgery may be done to remove the entire breast (mastectomy) and nipple. Lymph nodes may also be removed.  Radiation therapy, which uses high-energy rays to kill cancer cells.  Chemotherapy, which is the use of drugs to kill cancer cells.  Hormone therapy, which involves taking medicine to adjust the hormone levels in your body. You may take medicine to decrease your estrogen levels. This can help stop cancer cells from growing.  Targeted therapy, in which drugs are used to block the growth and spread of cancer cells. These drugs target a specific part of the cancer cell and usually cause fewer side effects than chemotherapy. Targeted therapy may be used alone or in combination with chemotherapy.  A combination of surgery, radiation, chemotherapy, or hormone therapy may be needed to treat breast cancer. Follow these instructions at home:  Take over-the-counter and prescription medicines only as told by your health care provider.  Eat a healthy diet. A healthy diet includes lots of fruits and vegetables, low-fat dairy products, lean meats, and fiber. ? Make sure half your plate is filled with fruits or vegetables. ? Choose high-fiber foods such as whole-grain breads and cereals.  Consider joining a support group. This may help you learn to cope with the stress of having breast cancer.  Talk to your health care team about exercise and physical activity. The right exercise program can: ? Help prevent or reduce symptoms such as fatigue or depression. ? Improve overall health and survival rates.  Keep all follow-up visits as told by your health care provider. This is important. Where to find more information  American Cancer Society:  www.cancer.[place]: www.cancer.gov Contact a health care provider if:  You have a sudden increase in pain.  You have any symptoms or changes that concern you.  You lose weight without trying.  You notice a new lump in either breast or under your arm.  You develop swelling in either arm or hand.  You have a fever.  You notice new fatigue or weakness. Get help right away if:  You have chest pain or trouble breathing.  You faint. Summary  Breast cancer is a malignant growth of tissue (tumor) in the breast.  Your cancer will be staged to determine its severity and extent.  Treatment for this condition depends on the type and stage of the breast cancer. This information is not intended to replace advice given to you by your health care provider. Make sure you discuss any questions you have with your health care provider. Document Revised: 10/13/2017 Document Reviewed: 06/26/2017 Elsevier Patient Education  [place].

## 2019-12-11 ENCOUNTER — Other Ambulatory Visit: Payer: Self-pay

## 2019-12-11 ENCOUNTER — Other Ambulatory Visit: Payer: Self-pay | Admitting: General Surgery

## 2019-12-11 DIAGNOSIS — C50911 Malignant neoplasm of unspecified site of right female breast: Secondary | ICD-10-CM

## 2019-12-12 ENCOUNTER — Telehealth: Payer: Self-pay | Admitting: General Surgery

## 2019-12-12 NOTE — Telephone Encounter (Signed)
Pt has been advised of pre admission date/time, Covid Testing date and Surgery date.  Surgery Date: 01/03/20 & [place] @ 7:45 am Preadmission Testing Date: 12/27/19 (1p-5p) Covid Testing Date: 01/01/20 - patient advised to go to the [facility] ([place])  Patient has been made aware to call [phone], between 1-3:00pm the day before surgery, to find out what time to arrive.

## 2019-12-13 ENCOUNTER — Encounter: Payer: Self-pay | Admitting: *Deleted

## 2019-12-13 ENCOUNTER — Telehealth: Payer: Self-pay

## 2019-12-13 NOTE — Telephone Encounter (Signed)
-----   Message from Earlie Server, MD sent at 12/13/2019  3:58 PM EST ----- Please schedule her to see me MD 3 weeks from 2/19.  If final lymph node is negative, please send OncotypeDX.  If lymph node is positive, I want to send mammaprint. Thanks.  Sheena please given me a reminder once you see final pathology is out. Thank you ----- Message ----- From: Rico Junker, RN Sent: 12/13/2019   9:59 AM EST To: Evelina Dun, RN, Earlie Server, MD  FYI:  Patients surgery is scheduled for 2/19.  You wanted to send off Oncotype and then schedule her follow up appt.  Thanks, [employer]

## 2019-12-13 NOTE — Telephone Encounter (Signed)
Done. Pt is aware.

## 2019-12-13 NOTE — Progress Notes (Signed)
Talked to patient and answered her questions regarding needle localization and sentinel lymph node biopsy.  She is scheduled for surgery on 01/03/20.

## 2019-12-18 ENCOUNTER — Other Ambulatory Visit: Payer: Self-pay

## 2019-12-18 ENCOUNTER — Telehealth: Payer: Self-pay | Admitting: *Deleted

## 2019-12-18 DIAGNOSIS — I10 Essential (primary) hypertension: Secondary | ICD-10-CM

## 2019-12-18 MED ORDER — LOSARTAN POTASSIUM 50 MG PO TABS: 50.0000 mg | ORAL_TABLET | Freq: Every day | ORAL | 3 refills | Status: DC

## 2019-12-18 NOTE — Telephone Encounter (Signed)
Patient wants a call back in regards to two procedure that she has to have before having surgery on 01/03/20 with Dr.Cannon

## 2019-12-18 NOTE — Telephone Encounter (Signed)
Those procedures are necessary for me to do the operation.  Since the area of cancer is not palpable, we use the wire localization to help guide me to the tissue that needs to be removed.  The injection for the sentinel node is to also guide me to the lymph nodes, using a kind of Geiger counter device, that I need to remove to check to see if any cancer has spread to them.

## 2019-12-18 NOTE — Telephone Encounter (Signed)
In regards to previous message from Dr. Celine Ahr. I called patient back and made her aware. She verbalized understanding of procedures and does not have further questions at this time. Advised pt if she does have any questions or concerns to please contact the office.

## 2019-12-18 NOTE — Telephone Encounter (Signed)
I called patient back. She is having sx on 01/03/20 BREAST LUMPECTOMY WITH NEEDLE LOCALIZATION AND AXILLARY SENTINEL LYMPH NODE BX. Patient is asking about the procedures she is having done prior to surgery: the Breast Stereo Wire and NM Sentinel Node Injection. Her daughter asked pt why she is having these procedures done and is unclear.   Made pt aware I will get in contact with Dr. Celine Ahr and either herself or I will call her back.

## 2019-12-24 ENCOUNTER — Other Ambulatory Visit: Payer: Self-pay | Admitting: General Surgery

## 2019-12-24 DIAGNOSIS — C50911 Malignant neoplasm of unspecified site of right female breast: Secondary | ICD-10-CM

## 2019-12-27 ENCOUNTER — Encounter
Admission: RE | Admit: 2019-12-27 | Discharge: 2019-12-27 | Disposition: A | Discharge status: Home / Self Care | Payer: PPO | Source: Ambulatory Visit | Attending: General Surgery | Admitting: General Surgery

## 2019-12-27 ENCOUNTER — Other Ambulatory Visit: Payer: Self-pay

## 2019-12-27 DIAGNOSIS — Z01818 Encounter for other preprocedural examination: Secondary | ICD-10-CM | POA: Insufficient documentation

## 2019-12-27 NOTE — Pre-Procedure Instructions (Signed)
Progress Notes - documented in this encounter Zeb Comfort, [place] - 12/10/2019 3:15 PM EST Formatting of this note might be different from the original. Established Patient Visit   Chief Complaint: Chief Complaint  Patient presents with  . 6 month follow up  no complaints  Date of Service: 12/10/2019 Date of Birth: 03/21/1941 PCP: Burnard Hawthorne, NP  History of Present Illness: [name] is a 79 y.o.female patient with a past medical history significant for endometrial cancer, colon cancer, recently diagnosed breast cancer, recent DVT, hypertension, and hyperlipidemia who presents for a follow up visit. She was originally referred to our office for further evaluation of lower extremity swelling. At this time, she had recently been diagnosed with a right DVT and was on Xarelto. This was thought to be secondary to malignancy and Xarelto has since been discontinued. She is doing well from a cardiac standpoint and denies chest pain, palpitations, shortness of breath, recurrent lower extremity swelling, orthopnea, PND, or syncopal/presyncopal episodes.   Echocardiogram at [facility] on 04/27/19 revealed normal LV systolic function with an EF estimated between 60-65% with evidence of diastolic dysfunction. No significant valvular abnormalities noted.   Past Medical and Surgical History  Past Medical History Past Medical History:  Diagnosis Date  . Arthritis  . Cancer (CMS-HCC)  uterine  . Hypertension  . Tremor   Past Surgical History She has a past surgical history that includes Hysterectomy Vaginal; d andc; Functional endoscopic sinus surgery; and Colonoscopy (09/18/2006,08/12/2000).   Medications and Allergies  Current Medications  Current Outpatient Medications  Medication Sig Dispense Refill  . docusate calcium (SURFAK) 240 mg capsule Take by mouth  . losartan (COZAAR) 50 MG tablet Take 1 tablet by mouth once daily.  . metoprolol succinate (TOPROL-XL) 100 MG XL tablet Take 1  tablet (100 mg total) by mouth nightly for 90 days 90 tablet 3  . multivitamin with minerals tablet Take by mouth.  . topiramate (TOPAMAX) 25 MG tablet Take 1 tablet (25 mg total) by mouth 2 (two) times daily for 90 days Take with Topiramate 50 mg two times a day for 75 mg two times a day 180 tablet 3  . topiramate (TOPAMAX) 50 MG tablet Take 1 tablet (50 mg total) by mouth 2 (two) times daily for 90 days 180 tablet 3  . VIT A/VIT C/VIT E/ZINC/COPPER (PRESERVISION AREDS ORAL) Take 1 tablet by mouth 2 (two) times daily   No current facility-administered medications for this visit.   Allergies: Codeine  Social and Family History  Social History reports that she has never smoked. She has never used smokeless tobacco. She reports current alcohol use. She reports that she does not use drugs.  Family History Family History  Problem Relation Age of Onset  . Colon cancer Mother  . Heart disease Father  . Cancer Sister   Review of Systems   Review of Systems: The patient denies chest pain, shortness of breath, orthopnea, paroxysmal nocturnal dyspnea, pedal edema, palpitations, heart racing, fatigue, dizziness, lightheadedness, presyncope, syncope, leg pain, leg cramping. Review of 12 Systems is negative except as described in HPI.   Physical Examination   Vitals:BP 130/84  Pulse 58  Resp 16  Ht 165.1 cm (5\' 5" )  Wt (!) 103 kg (227 lb)  SpO2 98%  BMI 37.77 kg/m  Ht:165.1 cm (5\' 5" ) Wt:(!) 103 kg (227 lb) [id number] surface area is 2.17 meters squared. Body mass index is 37.77 kg/m.  General: Well developed, well nourished. In no acute distress  HEENT: Pupils equally reactive to light and accomodation  Neck: Supple without thyromegaly, or goiter. Carotid pulses 2+. No carotid bruits present.  Pulmonary: Clear to auscultation bilaterally; no wheezes, rales, rhonchi Cardiovascular: Regular rate and rhythm. No gallops, murmurs or rubs Gastrointestinal: Soft nontender, nondistended, with  normal bowel sounds Extremities: No cyanosis, clubbing, or edema Peripheral Pulses: 2+ in upper extremities, 2+ in lower extremities  Neurology: Alert and oriented X3 Pysch: Good affect. Responds appropriately  Assessment and Plan   79 y.o. female with  1. Deep vein thrombosis (DVT) of proximal vein of right lower extremity, unspecified chronicity (CMS-HCC)  -No longer on Xarelto; followed by PCP  2. Essential hypertension, benign  -Continue losartan 50mg  daily and metoprolol 100mg  daily  3. Leg swelling  -Resolved  4. Hyperlipidemia, unspecified hyperlipidemia type  -Healthy, low fat diet recommended     No orders of the defined types were placed in this encounter.  Return if symptoms worsen or fail to improve.  I personally performed the service, non-incident to. (WP)   NICOLE Julian Hy, PA    Electronically signed by Zeb Comfort, PA at 12/11/2019 8:55 AM EST

## 2019-12-27 NOTE — Patient Instructions (Signed)
Your procedure is scheduled on: 01-03-20 FRIDAY Report to [place] @ 7:45 AM  Remember: Instructions that are not followed completely may result in serious medical risk, up to and including death, or upon the discretion of your surgeon and anesthesiologist your surgery may need to be rescheduled.    _x___ 1. Do not eat food after midnight the night before your procedure. NO GUM OR CANDY AFTER MIDNIGHT. You may drink clear liquids up to 2 hours before you are scheduled to arrive at the hospital for your procedure.  Do not drink clear liquids within 2 hours of your scheduled arrival to the hospital.  Clear liquids include  --Water or Apple juice without pulp  --Gatorade  --Black Coffee or Clear Tea (No milk, no creamers, do not add anything to the coffee or Tea   ____Ensure clear carbohydrate drink on the way to the hospital for bariatric patients  ____Ensure clear carbohydrate drink 3 hours before surgery.    __x__ 2. No Alcohol for 24 hours before or after surgery.   __x__3. No Smoking or e-cigarettes for 24 prior to surgery.  Do not use any chewable tobacco products for at least 6 hour prior to surgery   ____  4. Bring all medications with you on the day of surgery if instructed.    __x__ 5. Notify your doctor if there is any change in your medical condition     (cold, fever, infections).    x___6. On the morning of surgery brush your teeth with toothpaste and water.  You may rinse your mouth with mouth wash if you wish.  Do not swallow any toothpaste or mouthwash.   Do not wear jewelry, make-up, hairpins, clips or nail polish.  Do not wear lotions, powders, or perfumes.  Do not shave 48 hours prior to surgery. Men may shave face and neck.  Do not bring valuables to the hospital.    [facility] is not responsible for any belongings or valuables.               Contacts, dentures or bridgework may not be worn into surgery.  Leave your suitcase in the car. After  surgery it may be brought to your room.  For patients admitted to the hospital, discharge time is determined by your treatment team.  _  Patients discharged the day of surgery will not be allowed to drive home.  You will need someone to drive you home and stay with you the night of your procedure.    Please read over the following fact sheets that you were given:   [facility] Preparing for Surgery   _x___ TAKE THE FOLLOWING MEDICATION THE MORNING OF SURGERY WITH A SMALL SIP OF WATER. These include:  1. TOPAMAX (TOPIRAMATE)  2.  3.  4.  5.  6.  ____Fleets enema or Magnesium Citrate as directed.   _x___ Use CHG Soap or sage wipes as directed on instruction sheet   ____ Use inhalers on the day of surgery and bring to hospital day of surgery  ____ Stop Metformin and Janumet 2 days prior to surgery.    ____ Take 1/2 of usual insulin dose the night before surgery and none on the morning surgery.   ____ Follow recommendations from Cardiologist, Pulmonologist or PCP regarding stopping Aspirin, Coumadin, Plavix ,Eliquis, Effient, or Pradaxa, and Pletal.  X____Stop Anti-inflammatories such as Advil, Aleve, Ibuprofen, Motrin, Naproxen, Naprosyn, Goodies powders or aspirin products NOW-OK to take Tylenol    ____ Stop  supplements until after surgery.     ____ Bring C-Pap to the hospital.

## 2019-12-27 NOTE — Pre-Procedure Instructions (Signed)
ECG 12-lead7/28/2020 [place] Component Name Value Ref Range  Vent Rate (bpm) 66   PR Interval (msec) 176   QRS Interval (msec) 86   QT Interval (msec) 394   QTc (msec) 413   Other Result Information  This result has an attachment that is not available.  Result Narrative  Normal sinus rhythm Normal ECG No previous ECGs available I reviewed and concur with this report. Electronically signed [id number] MD, KEN [id number]) on 06/13/2019 10:17:27 AM  Status Results Details   Encounter Summary

## 2019-12-30 ENCOUNTER — Other Ambulatory Visit: Payer: Self-pay

## 2019-12-31 ENCOUNTER — Inpatient Hospital Stay: Payer: PPO | Attending: Oncology

## 2019-12-31 ENCOUNTER — Other Ambulatory Visit: Payer: Self-pay

## 2019-12-31 DIAGNOSIS — C186 Malignant neoplasm of descending colon: Secondary | ICD-10-CM | POA: Insufficient documentation

## 2019-12-31 DIAGNOSIS — Z452 Encounter for adjustment and management of vascular access device: Secondary | ICD-10-CM | POA: Insufficient documentation

## 2019-12-31 DIAGNOSIS — Z9221 Personal history of antineoplastic chemotherapy: Secondary | ICD-10-CM | POA: Diagnosis not present

## 2019-12-31 DIAGNOSIS — Z170 Estrogen receptor positive status [ER+]: Secondary | ICD-10-CM | POA: Insufficient documentation

## 2019-12-31 DIAGNOSIS — Z8542 Personal history of malignant neoplasm of other parts of uterus: Secondary | ICD-10-CM | POA: Diagnosis not present

## 2019-12-31 DIAGNOSIS — C50511 Malignant neoplasm of lower-outer quadrant of right female breast: Secondary | ICD-10-CM | POA: Diagnosis not present

## 2019-12-31 DIAGNOSIS — Z923 Personal history of irradiation: Secondary | ICD-10-CM | POA: Insufficient documentation

## 2019-12-31 DIAGNOSIS — Z95828 Presence of other vascular implants and grafts: Secondary | ICD-10-CM

## 2019-12-31 MED ORDER — HEPARIN SOD (PORK) LOCK FLUSH 100 UNIT/ML IV SOLN
500.0000 [IU] | Freq: Once | INTRAVENOUS | Status: AC
  Administered 2019-12-31: 13:00:00 500 [IU] via INTRAVENOUS
  Filled 2019-12-31: qty 5

## 2019-12-31 MED ORDER — SODIUM CHLORIDE 0.9% FLUSH
10.0000 mL | Freq: Once | INTRAVENOUS | Status: AC
  Administered 2019-12-31: 13:00:00 10 mL via INTRAVENOUS
  Filled 2019-12-31: qty 10

## 2020-01-01 ENCOUNTER — Other Ambulatory Visit
Admission: RE | Admit: 2020-01-01 | Discharge: 2020-01-01 | Disposition: A | Discharge status: Home / Self Care | Payer: PPO | Source: Ambulatory Visit | Attending: General Surgery | Admitting: General Surgery

## 2020-01-01 DIAGNOSIS — Z01812 Encounter for preprocedural laboratory examination: Secondary | ICD-10-CM | POA: Insufficient documentation

## 2020-01-01 DIAGNOSIS — Z20822 Contact with and (suspected) exposure to covid-19: Secondary | ICD-10-CM | POA: Diagnosis not present

## 2020-01-01 LAB — SARS CORONAVIRUS 2 (TAT 6-24 HRS): SARS Coronavirus 2: NEGATIVE

## 2020-01-03 ENCOUNTER — Encounter: Payer: Self-pay | Admitting: General Surgery

## 2020-01-03 ENCOUNTER — Other Ambulatory Visit: Payer: Self-pay | Admitting: General Surgery

## 2020-01-03 ENCOUNTER — Other Ambulatory Visit: Payer: Self-pay

## 2020-01-03 ENCOUNTER — Ambulatory Visit: Payer: PPO | Admitting: Certified Registered Nurse Anesthetist

## 2020-01-03 ENCOUNTER — Ambulatory Visit
Admission: RE | Admit: 2020-01-03 | Discharge: 2020-01-03 | Disposition: A | Discharge status: Home / Self Care | Payer: PPO | Source: Ambulatory Visit | Attending: General Surgery | Admitting: General Surgery

## 2020-01-03 ENCOUNTER — Ambulatory Visit
Admission: RE | Admit: 2020-01-03 | Discharge: 2020-01-03 | Disposition: A | Discharge status: Home / Self Care | Payer: PPO | Attending: General Surgery | Admitting: General Surgery

## 2020-01-03 ENCOUNTER — Ambulatory Visit (HOSPITAL_BASED_OUTPATIENT_CLINIC_OR_DEPARTMENT_OTHER)
Admission: RE | Admit: 2020-01-03 | Discharge: 2020-01-03 | Disposition: A | Discharge status: Home / Self Care | Payer: PPO | Source: Ambulatory Visit | Attending: General Surgery | Admitting: General Surgery

## 2020-01-03 ENCOUNTER — Encounter
Admission: RE | Disposition: A | Discharge status: Home / Self Care | Payer: Self-pay | Source: Home / Self Care | Attending: General Surgery

## 2020-01-03 DIAGNOSIS — Z885 Allergy status to narcotic agent status: Secondary | ICD-10-CM | POA: Insufficient documentation

## 2020-01-03 DIAGNOSIS — Z170 Estrogen receptor positive status [ER+]: Secondary | ICD-10-CM

## 2020-01-03 DIAGNOSIS — M170 Bilateral primary osteoarthritis of knee: Secondary | ICD-10-CM | POA: Insufficient documentation

## 2020-01-03 DIAGNOSIS — C50919 Malignant neoplasm of unspecified site of unspecified female breast: Secondary | ICD-10-CM | POA: Diagnosis not present

## 2020-01-03 DIAGNOSIS — I10 Essential (primary) hypertension: Secondary | ICD-10-CM | POA: Diagnosis not present

## 2020-01-03 DIAGNOSIS — R609 Edema, unspecified: Secondary | ICD-10-CM | POA: Insufficient documentation

## 2020-01-03 DIAGNOSIS — C50911 Malignant neoplasm of unspecified site of right female breast: Secondary | ICD-10-CM

## 2020-01-03 DIAGNOSIS — K219 Gastro-esophageal reflux disease without esophagitis: Secondary | ICD-10-CM | POA: Diagnosis not present

## 2020-01-03 DIAGNOSIS — Z803 Family history of malignant neoplasm of breast: Secondary | ICD-10-CM | POA: Insufficient documentation

## 2020-01-03 DIAGNOSIS — M19072 Primary osteoarthritis, left ankle and foot: Secondary | ICD-10-CM | POA: Diagnosis not present

## 2020-01-03 DIAGNOSIS — C50511 Malignant neoplasm of lower-outer quadrant of right female breast: Secondary | ICD-10-CM | POA: Diagnosis not present

## 2020-01-03 DIAGNOSIS — Z8542 Personal history of malignant neoplasm of other parts of uterus: Secondary | ICD-10-CM | POA: Diagnosis not present

## 2020-01-03 DIAGNOSIS — E785 Hyperlipidemia, unspecified: Secondary | ICD-10-CM | POA: Diagnosis not present

## 2020-01-03 DIAGNOSIS — Z85038 Personal history of other malignant neoplasm of large intestine: Secondary | ICD-10-CM | POA: Diagnosis not present

## 2020-01-03 DIAGNOSIS — Z79899 Other long term (current) drug therapy: Secondary | ICD-10-CM | POA: Diagnosis not present

## 2020-01-03 HISTORY — PX: BREAST LUMPECTOMY WITH NEEDLE LOCALIZATION AND AXILLARY SENTINEL LYMPH NODE BX: SHX5760

## 2020-01-03 HISTORY — PX: BREAST LUMPECTOMY: SHX2

## 2020-01-03 SURGERY — BREAST LUMPECTOMY WITH NEEDLE LOCALIZATION AND AXILLARY SENTINEL LYMPH NODE BX
Anesthesia: General | Site: Breast | Laterality: Right

## 2020-01-03 MED ORDER — OXYCODONE HCL 5 MG PO TABS
5.0000 mg | ORAL_TABLET | Freq: Once | ORAL | Status: AC | PRN
  Administered 2020-01-03: 17:00:00 5 mg via ORAL

## 2020-01-03 MED ORDER — CHLORHEXIDINE GLUCONATE CLOTH 2 % EX PADS: 6.0000 | MEDICATED_PAD | Freq: Once | CUTANEOUS | Status: DC

## 2020-01-03 MED ORDER — CEFAZOLIN SODIUM-DEXTROSE 2-4 GM/100ML-% IV SOLN
2.0000 g | INTRAVENOUS | Status: AC
  Administered 2020-01-03: 13:00:00 2 g via INTRAVENOUS

## 2020-01-03 MED ORDER — GLYCOPYRROLATE 0.2 MG/ML IJ SOLN
INTRAMUSCULAR | Status: DC | PRN
  Administered 2020-01-03: .2 mg via INTRAVENOUS

## 2020-01-03 MED ORDER — SUGAMMADEX SODIUM 500 MG/5ML IV SOLN
INTRAVENOUS | Status: DC | PRN
  Administered 2020-01-03: 204.2 mg via INTRAVENOUS

## 2020-01-03 MED ORDER — OXYCODONE HCL 5 MG PO TABS
ORAL_TABLET | ORAL | Status: AC
  Filled 2020-01-03: qty 1

## 2020-01-03 MED ORDER — DEXAMETHASONE SODIUM PHOSPHATE 10 MG/ML IJ SOLN
INTRAMUSCULAR | Status: AC
  Filled 2020-01-03: qty 1

## 2020-01-03 MED ORDER — SEVOFLURANE IN SOLN
RESPIRATORY_TRACT | Status: AC
  Filled 2020-01-03: qty 250

## 2020-01-03 MED ORDER — FENTANYL CITRATE (PF) 100 MCG/2ML IJ SOLN
INTRAMUSCULAR | Status: AC
  Filled 2020-01-03: qty 2

## 2020-01-03 MED ORDER — IBUPROFEN 800 MG PO TABS: 800.0000 mg | ORAL_TABLET | Freq: Three times a day (TID) | ORAL | 0 refills | Status: DC | PRN

## 2020-01-03 MED ORDER — FENTANYL CITRATE (PF) 100 MCG/2ML IJ SOLN
INTRAMUSCULAR | Status: AC
  Administered 2020-01-03: 16:00:00 25 ug via INTRAVENOUS
  Filled 2020-01-03: qty 2

## 2020-01-03 MED ORDER — BUPIVACAINE HCL (PF) 0.25 % IJ SOLN
INTRAMUSCULAR | Status: AC
  Filled 2020-01-03: qty 30

## 2020-01-03 MED ORDER — ROCURONIUM BROMIDE 50 MG/5ML IV SOLN
INTRAVENOUS | Status: AC
  Filled 2020-01-03: qty 1

## 2020-01-03 MED ORDER — PROPOFOL 10 MG/ML IV BOLUS
INTRAVENOUS | Status: AC
  Filled 2020-01-03: qty 20

## 2020-01-03 MED ORDER — OXYCODONE HCL 5 MG PO TABS: 5.0000 mg | ORAL_TABLET | Freq: Four times a day (QID) | ORAL | 0 refills | Status: DC | PRN

## 2020-01-03 MED ORDER — METHYLENE BLUE 0.5 % INJ SOLN
INTRAVENOUS | Status: DC | PRN
  Administered 2020-01-03: 10 mL

## 2020-01-03 MED ORDER — PROPOFOL 500 MG/50ML IV EMUL
INTRAVENOUS | Status: DC | PRN
  Administered 2020-01-03: 30 mg via INTRAVENOUS
  Administered 2020-01-03: 150 mg via INTRAVENOUS

## 2020-01-03 MED ORDER — LIDOCAINE HCL (CARDIAC) PF 100 MG/5ML IV SOSY
PREFILLED_SYRINGE | INTRAVENOUS | Status: DC | PRN
  Administered 2020-01-03: 100 mg via INTRAVENOUS

## 2020-01-03 MED ORDER — PROPOFOL 500 MG/50ML IV EMUL
INTRAVENOUS | Status: AC
  Filled 2020-01-03: qty 50

## 2020-01-03 MED ORDER — ACETAMINOPHEN 500 MG PO TABS
1000.0000 mg | ORAL_TABLET | ORAL | Status: AC
  Administered 2020-01-03: 10:00:00 1000 mg via ORAL

## 2020-01-03 MED ORDER — PROMETHAZINE HCL 25 MG/ML IJ SOLN: 6.2500 mg | INTRAMUSCULAR | Status: DC | PRN

## 2020-01-03 MED ORDER — ROCURONIUM BROMIDE 100 MG/10ML IV SOLN
INTRAVENOUS | Status: DC | PRN
  Administered 2020-01-03: 10 mg via INTRAVENOUS
  Administered 2020-01-03: 50 mg via INTRAVENOUS
  Administered 2020-01-03: 30 mg via INTRAVENOUS

## 2020-01-03 MED ORDER — SUGAMMADEX SODIUM 200 MG/2ML IV SOLN
INTRAVENOUS | Status: AC
  Filled 2020-01-03: qty 4

## 2020-01-03 MED ORDER — ONDANSETRON HCL 4 MG/2ML IJ SOLN
INTRAMUSCULAR | Status: AC
  Filled 2020-01-03: qty 2

## 2020-01-03 MED ORDER — LIDOCAINE HCL (PF) 2 % IJ SOLN
INTRAMUSCULAR | Status: AC
  Filled 2020-01-03: qty 10

## 2020-01-03 MED ORDER — PROPOFOL 500 MG/50ML IV EMUL
INTRAVENOUS | Status: DC | PRN
  Administered 2020-01-03: 150 ug/kg/min via INTRAVENOUS

## 2020-01-03 MED ORDER — ONDANSETRON HCL 4 MG/2ML IJ SOLN
INTRAMUSCULAR | Status: DC | PRN
  Administered 2020-01-03: 4 mg via INTRAVENOUS

## 2020-01-03 MED ORDER — FENTANYL CITRATE (PF) 100 MCG/2ML IJ SOLN
25.0000 ug | INTRAMUSCULAR | Status: DC | PRN
  Administered 2020-01-03: 25 ug via INTRAVENOUS

## 2020-01-03 MED ORDER — FAMOTIDINE 20 MG PO TABS
20.0000 mg | ORAL_TABLET | Freq: Once | ORAL | Status: AC
  Administered 2020-01-03: 20 mg via ORAL

## 2020-01-03 MED ORDER — FENTANYL CITRATE (PF) 100 MCG/2ML IJ SOLN
INTRAMUSCULAR | Status: DC | PRN
  Administered 2020-01-03 (×3): 50 ug via INTRAVENOUS

## 2020-01-03 MED ORDER — TECHNETIUM TC 99M SULFUR COLLOID FILTERED
1.0000 | Freq: Once | INTRAVENOUS | Status: AC | PRN
  Administered 2020-01-03: 09:00:00 0.687 via INTRADERMAL

## 2020-01-03 MED ORDER — PROPOFOL 500 MG/50ML IV EMUL
INTRAVENOUS | Status: AC
  Filled 2020-01-03: qty 100

## 2020-01-03 MED ORDER — ACETAMINOPHEN 500 MG PO TABS: 1000.0000 mg | ORAL_TABLET | Freq: Four times a day (QID) | ORAL | 0 refills | Status: AC

## 2020-01-03 MED ORDER — LIDOCAINE-EPINEPHRINE 1 %-1:100000 IJ SOLN
INTRAMUSCULAR | Status: DC | PRN
  Administered 2020-01-03: 5 mL

## 2020-01-03 MED ORDER — BUPIVACAINE HCL 0.25 % IJ SOLN
INTRAMUSCULAR | Status: DC | PRN
  Administered 2020-01-03: 5 mL

## 2020-01-03 MED ORDER — OXYCODONE HCL 5 MG/5ML PO SOLN: 5.0000 mg | Freq: Once | ORAL | Status: AC | PRN

## 2020-01-03 MED ORDER — BUPIVACAINE LIPOSOME 1.3 % IJ SUSP: 20.0000 mL | Freq: Once | INTRAMUSCULAR | Status: DC

## 2020-01-03 MED ORDER — LACTATED RINGERS IV SOLN: INTRAVENOUS | Status: DC

## 2020-01-03 MED ORDER — METHYLENE BLUE 0.5 % INJ SOLN
INTRAVENOUS | Status: AC
  Filled 2020-01-03: qty 10

## 2020-01-03 MED ORDER — GABAPENTIN 300 MG PO CAPS
300.0000 mg | ORAL_CAPSULE | ORAL | Status: AC
  Administered 2020-01-03: 10:00:00 300 mg via ORAL

## 2020-01-03 MED ORDER — LIDOCAINE-EPINEPHRINE 1 %-1:100000 IJ SOLN
INTRAMUSCULAR | Status: AC
  Filled 2020-01-03: qty 1

## 2020-01-03 MED ORDER — CELECOXIB 200 MG PO CAPS
200.0000 mg | ORAL_CAPSULE | ORAL | Status: AC
  Administered 2020-01-03: 10:00:00 200 mg via ORAL

## 2020-01-03 MED ORDER — DEXAMETHASONE SODIUM PHOSPHATE 10 MG/ML IJ SOLN
INTRAMUSCULAR | Status: DC | PRN
  Administered 2020-01-03: 10 mg via INTRAVENOUS

## 2020-01-03 SURGICAL SUPPLY — 56 items
BINDER BREAST LRG (GAUZE/BANDAGES/DRESSINGS) IMPLANT
BINDER BREAST MEDIUM (GAUZE/BANDAGES/DRESSINGS) IMPLANT
BINDER BREAST XLRG (GAUZE/BANDAGES/DRESSINGS) IMPLANT
BINDER BREAST XXLRG (GAUZE/BANDAGES/DRESSINGS) ×2 IMPLANT
BLADE J PLASMA LAP HANDPIECE (INSTRUMENTS) ×2 IMPLANT
BLADE SURG 15 STRL LF DISP TIS (BLADE) ×2 IMPLANT
BLADE SURG 15 STRL SS (BLADE) ×2
CANISTER SUCT 1200ML W/VALVE (MISCELLANEOUS) ×2 IMPLANT
CHLORAPREP W/TINT 26 (MISCELLANEOUS) ×4 IMPLANT
CLIP VESOCCLUDE SM WIDE 6/CT (CLIP) ×2 IMPLANT
CNTNR SPEC 2.5X3XGRAD LEK (MISCELLANEOUS)
CONT SPEC 4OZ STER OR WHT (MISCELLANEOUS)
CONTAINER SPEC 2.5X3XGRAD LEK (MISCELLANEOUS) IMPLANT
COVER PROBE FLX POLY STRL (MISCELLANEOUS) ×2 IMPLANT
COVER WAND RF STERILE (DRAPES) ×2 IMPLANT
DERMABOND ADVANCED (GAUZE/BANDAGES/DRESSINGS) ×1
DERMABOND ADVANCED .7 DNX12 (GAUZE/BANDAGES/DRESSINGS) ×1 IMPLANT
DEVICE DISSECT PLASMABLAD 3.0S (MISCELLANEOUS) ×1 IMPLANT
DEVICE DUBIN SPECIMEN MAMMOGRA (MISCELLANEOUS) ×2 IMPLANT
DRAPE LAPAROTOMY 77X122 PED (DRAPES) ×2 IMPLANT
DRSG GAUZE FLUFF 36X18 (GAUZE/BANDAGES/DRESSINGS) ×2 IMPLANT
ELECT CAUTERY BLADE TIP 2.5 (TIP) ×2
ELECT REM PT RETURN 9FT ADLT (ELECTROSURGICAL) ×2
ELECTRODE CAUTERY BLDE TIP 2.5 (TIP) ×1 IMPLANT
ELECTRODE REM PT RTRN 9FT ADLT (ELECTROSURGICAL) ×1 IMPLANT
GAUZE 4X4 16PLY RFD (DISPOSABLE) ×2 IMPLANT
GLOVE BIO SURGEON STRL SZ 6.5 (GLOVE) ×6 IMPLANT
GLOVE INDICATOR 7.0 STRL GRN (GLOVE) ×8 IMPLANT
GOWN STRL REUS W/ TWL LRG LVL3 (GOWN DISPOSABLE) ×4 IMPLANT
GOWN STRL REUS W/TWL LRG LVL3 (GOWN DISPOSABLE) ×4
KIT MARKER MARGIN INK (KITS) ×2 IMPLANT
KIT TURNOVER KIT A (KITS) ×2 IMPLANT
LABEL OR SOLS (LABEL) ×2 IMPLANT
MARKER MARGIN CORRECT CLIP (MARKER) ×2 IMPLANT
NEEDLE HYPO 22GX1.5 SAFETY (NEEDLE) ×2 IMPLANT
NEEDLE HYPO 25X1 1.5 SAFETY (NEEDLE) ×2 IMPLANT
PACK BASIN MINOR ARMC (MISCELLANEOUS) ×2 IMPLANT
PLASMABLADE 3.0S (MISCELLANEOUS) ×2
RETRACTOR RING XSMALL (MISCELLANEOUS) ×1 IMPLANT
RTRCTR WOUND ALEXIS 13CM XS SH (MISCELLANEOUS) ×2
SLEVE PROBE SENORX GAMMA FIND (MISCELLANEOUS) ×2 IMPLANT
SPONGE LAP 18X18 RF (DISPOSABLE) ×2 IMPLANT
STRIP CLOSURE SKIN 1/2X4 (GAUZE/BANDAGES/DRESSINGS) ×4 IMPLANT
SUT MNCRL 4-0 (SUTURE) ×1
SUT MNCRL 4-0 27XMFL (SUTURE) ×1
SUT SILK 2 0 SH (SUTURE) ×2 IMPLANT
SUT VIC AB 2-0 SH 27 (SUTURE) ×4
SUT VIC AB 2-0 SH 27XBRD (SUTURE) ×4 IMPLANT
SUT VIC AB 3-0 SH 27 (SUTURE) ×2
SUT VIC AB 3-0 SH 27X BRD (SUTURE) ×2 IMPLANT
SUTURE MNCRL 4-0 27XMF (SUTURE) ×1 IMPLANT
SYR 10ML LL (SYRINGE) ×4 IMPLANT
SYR BULB IRRIG 60ML STRL (SYRINGE) ×2 IMPLANT
TAPE TRANSPORE STRL 2 31045 (GAUZE/BANDAGES/DRESSINGS) ×2 IMPLANT
TUBING CONNECTING 10 (TUBING) ×2 IMPLANT
WATER STERILE IRR 1000ML POUR (IV SOLUTION) ×2 IMPLANT

## 2020-01-03 NOTE — Op Note (Signed)
Breast Lumpectomy with Sentinal Node Biopsy Procedure Note  Indications: This patient presents with history of right breast cancer with clinically negative axillary lymph node exam.  Pre-operative Diagnosis: right breast cancer  Post-operative Diagnosis: right breast cancer  Surgeon: Fredirick Maudlin   Assistants: Caroleen Hamman, MD (a second surgeon was required to facilitate adequate exposure; no other qualified assistant was available)  Anesthesia: General endotracheal anesthesia  ASA Class: 3  Procedure Details  The patient was seen in the Holding Room.  She had already been seen in the breast care center where a wire localization had been performed, as well as injection of technetium sulfur colloid.  The risks, benefits, complications, treatment options, and expected outcomes were discussed with the patient. The possibilities of reaction to medication, pulmonary aspiration, bleeding, infection, the need for additional procedures, failure to diagnose a condition, and creating a complication requiring transfusion or operation were discussed with the patient. The patient concurred with the proposed plan, giving informed consent.  The site of surgery properly noted/marked. The patient was taken to operating room, identified as [name] and the procedure verified as Breast Lumpectomy and Sentinal Node Biopsy. A Time Out was held and the above information confirmed.  After induction of anesthesia, 10 cc of methylene blue were injected at the 12:00, 3:00, 6:00, and 9:00 positions surrounding the areola.  The breast was then massaged for 5 minutes.  The right axilla, breast, and chest were prepped and draped in standard fashion.  Using a hand-held gamma probe, axillary sentinel nodes were identified transcutaneously.  An oblique incision was created below the axillary hairline.  Dissection was carried through the clavipectoral fascia.  3 deep axillary sentinel nodes were removed and submitted to  pathology.  Sentinel lymph node 1 was both hot and blue and had a count of 696.  Sentinel lymph node #2 was hot and blue and had a count of 4550.  Sentinel lymph node 3 was both hot and blue and had a count of 893.  The gamma probe was then reintroduced into the axilla and the highest count appreciated was 113, indicating that the sentinel nodes had all been removed.  The axillary incision was irrigated and good hemostasis was obtained.  The lumpectomy was performed by creating an oblique incision over the lower outer quadrant of the breastaround the previously placed localization guidewire.  Dissection was carried down to the pectoral fascia.  The specimen was removed and oriented using ink and marker clips.  The specimen was then sent to radiology, where it was confirmed that the lesion of interest was included in our specimen.  Pathology then evaluated the specimen and identified a 6 to 7 mm primary lesion with a 3 mm closest margin on the anterior portion of the specimen.  Hemostasis was achieved with cautery.  Due to the fairly substantial tissue defect, we elected to perform a tissue rearrangement.  Approximately 50 cm of breast tissue was mobilized and sutured in such a fashion that the defect was substantially obliterated.  There was relatively normal breast contour once we had accomplished this mobilization.  The wound was irrigated.  Both the axillary incision and lumpectomy incision were closed in layers.  We used a 3-0 Vicryl to reapproximate the deep dermal layers and the skin was closed with a running subcuticular 4-0 Monocryl.  The skin was cleaned and Dermabond and Steri-Strips were applied.  Gauze fluffs and a breast binder were then used as the final part of her dressing.  She  was then awakened, extubated, and taken to the postanesthesia care unit in good condition.    At the end of the operation, all sponge, instrument, and needle counts were correct.  Findings: Radiology confirmed  inclusion of the lesion of interest, marker clip, and wire within the specimen.  Pathology determined that our closest margin was 3 mm at the anterior aspect of the specimen.  Sentinel nodes as described above.  Estimated Blood Loss:  Minimal         Drains: None         Total IV Fluids: See anesthesia record.         Specimens: Sentinel lymph nodes, lumpectomy (right breast)         Implants: None         Complications:  None; patient tolerated the procedure well.         Disposition: PACU - hemodynamically stable.         Condition: stable  Attending Attestation: I was present and scrubbed for the entire procedure.

## 2020-01-03 NOTE — Anesthesia Postprocedure Evaluation (Signed)
Anesthesia Post Note  Patient: [name]  Procedure(s) Performed: BREAST LUMPECTOMY WITH NEEDLE LOCALIZATION AND AXILLARY SENTINEL LYMPH NODE BX (Right Breast)  Patient location during evaluation: PACU Anesthesia Type: General Level of consciousness: awake and alert Pain management: pain level controlled Vital Signs Assessment: post-procedure vital signs reviewed and stable Respiratory status: spontaneous breathing, nonlabored ventilation, respiratory function stable and patient connected to nasal cannula oxygen Cardiovascular status: blood pressure returned to baseline and stable Postop Assessment: no apparent nausea or vomiting Anesthetic complications: no     Last Vitals:  Vitals:   01/03/20 1700 01/03/20 1720  BP: (!) 156/82 (!) 162/62  Pulse: 62 61  Resp: 18 16  Temp: (!) 36.1 C   SpO2: 97% 99%    Last Pain:  Vitals:   01/03/20 1720  TempSrc:   PainSc: 4                  Martha Clan

## 2020-01-03 NOTE — Transfer of Care (Signed)
Immediate Anesthesia Transfer of Care Note  Patient: [name]  Procedure(s) Performed: BREAST LUMPECTOMY WITH NEEDLE LOCALIZATION AND AXILLARY SENTINEL LYMPH NODE BX (Right Breast)  Patient Location: PACU  Anesthesia Type:General  Level of Consciousness: drowsy  Airway & Oxygen Therapy: Patient Spontanous Breathing and Patient connected to face mask oxygen  Post-op Assessment: Report given to RN and Post -op Vital signs reviewed and stable  Post vital signs: Reviewed and stable  Last Vitals:  Vitals Value Taken Time  BP 131/60 01/03/20 1546  Temp    Pulse 58 01/03/20 1546  Resp 16 01/03/20 1546  SpO2 99 % 01/03/20 1546  Vitals shown include unvalidated device data.  Last Pain:  Vitals:   01/03/20 1012  TempSrc: Oral  PainSc: 0-No pain         Complications: No apparent anesthesia complications

## 2020-01-03 NOTE — Discharge Instructions (Addendum)
You may resume your Xarelto in 2 days.    Sentinel Lymph Node Biopsy in Breast Cancer Treatment, Care After This sheet gives you information about how to care for yourself after your procedure. Your health care provider may also give you more specific instructions. If you have problems or questions, contact your health care provider. What can I expect after the procedure? After the procedure, it is common to have:  Blue urine and darker stool for the next 24 hours. This is normal. It is caused by the dye that was used during the procedure.  Blue skin at the injection site. This may last for up to 8 weeks.  Numbness, tingling, or pain near your incision.  Swelling or bruising near your incision. Follow these instructions at home: Activity  Avoid activities that take a lot of effort.  Return to your normal activities as told by your health care provider. Ask your health care provider what activities are safe for you. Incision care   Follow instructions from your health care provider about how to take care of your incision. Make sure you: ? Wash your hands with soap and water for at least 20 seconds before and after you change your bandage (dressing). If soap and water are not available, use hand sanitizer. ? Change your dressing as told by your health care provider. ? Leave stitches (sutures), skin glue, or adhesive strips in place. These skin closures may need to stay in place for 2 weeks or longer. If adhesive strip edges start to loosen and curl up, you may trim the loose edges. Do not remove adhesive strips completely unless your health care provider tells you to do that.  Do not take baths, swim, or use a hot tub until your health care provider approves. Ask your health care provider if you can take showers. You may be able to shower 24 hours after your procedure.  Check your biopsy site every day for signs of infection. Check for: ? More redness, swelling, or pain. ? Fluid or  blood. ? Warmth. ? Pus or a bad smell. General instructions  If you were given a surgical bra, wear it for the next 48 hours. You may remove the bra to shower.  Take over-the-counter and prescription medicines only as told by your health care provider.  You may resume your regular diet.  Do not have your blood pressure taken or have blood drawn from the arm on the side of the biopsy until your health care provider says it is okay.  You may need to be screened for extra fluid around the lymph nodes and swelling in the breast and arm (lymphedema). Follow instructions from your health care provider about how often you should be checked.  Keep all follow-up visits as told by your health care provider. This is important. Contact a health care provider if you have:  Nausea and vomiting.  Any of these signs of infection: ? More redness, swelling, or pain around your biopsy site. ? Fluid or blood coming from your incision. ? Warmth coming from your incision area. ? Pus or a bad smell coming from your incision.  Any new bruising.  Chills or a fever. Get help right away if you have:  Pain that is getting worse and your medicine is not helping.  Vomiting that will not stop.  Chest pain or trouble breathing. Summary  After the procedure, it is common to have blue urine and darker stool for the next 24 hours. This is  normal. It is caused by the dye that was used during the procedure.  Follow instructions from your health care provider about how to take care of your incision.  Do not have your blood pressure taken or have blood drawn from the arm on the side of the biopsy until your health care provider says it is okay.  You may need to be screened for extra fluid around the lymph nodes and swelling in the breast and arm (lymphedema). Follow instructions from your health care provider about how often you should be checked. This information is not intended to replace advice given to you  by your health care provider. Make sure you discuss any questions you have with your health care provider. Document Revised: 06/17/2019 Document Reviewed: 06/17/2019 Elsevier Patient Education  [place]   1) The drugs that you were given will stay in your system until tomorrow so for the next 24 hours you should not:  A) Drive an automobile B) Make any legal decisions C) Drink any alcoholic beverage   2) You may resume regular meals tomorrow.  Today it is better to start with liquids and gradually work up to solid foods.  You may eat anything you prefer, but it is better to start with liquids, then soup and crackers, and gradually work up to solid foods.   3) Please notify your doctor immediately if you have any unusual bleeding, trouble breathing, redness and pain at the surgery site, drainage, fever, or pain not relieved by medication.    4) Additional Instructions:        Please contact your physician with any problems or Same Day Surgery at [phone], Monday through Friday 6 am to 4 pm, or [place] at [facility] number at [phone].

## 2020-01-03 NOTE — Interval H&P Note (Signed)
History and Physical Interval Note:  01/03/2020 12:45 PM  [name]  has presented today for surgery, with the diagnosis of Breast Cancer.  The various methods of treatment have been discussed with the patient and family. After consideration of risks, benefits and other options for treatment, the patient has consented to  Procedure(s): BREAST LUMPECTOMY WITH NEEDLE LOCALIZATION AND AXILLARY SENTINEL LYMPH NODE BX (Right) as a surgical intervention.  The patient's history has been reviewed, patient examined, no change in status, stable for surgery.  I have reviewed the patient's chart and labs.  Questions were answered to the patient's satisfaction.     Fredirick Maudlin

## 2020-01-03 NOTE — Anesthesia Procedure Notes (Signed)
Procedure Name: Intubation Date/Time: 01/03/2020 12:59 PM Performed by: Caryl Asp, CRNA Pre-anesthesia Checklist: Patient identified, Patient being monitored, Timeout performed, Emergency Drugs available and Suction available Patient Re-evaluated:Patient Re-evaluated prior to induction Oxygen Delivery Method: Circle system utilized Preoxygenation: Pre-oxygenation with 100% oxygen Induction Type: IV induction Ventilation: Mask ventilation without difficulty Laryngoscope Size: 3 and McGraph Grade View: Grade I Tube type: Oral Tube size: 7.0 mm Number of attempts: 1 Airway Equipment and Method: Stylet Placement Confirmation: ETT inserted through vocal cords under direct vision,  positive ETCO2 and breath sounds checked- equal and bilateral Secured at: 22 cm Tube secured with: Tape Dental Injury: Teeth and Oropharynx as per pre-operative assessment

## 2020-01-03 NOTE — Anesthesia Preprocedure Evaluation (Addendum)
Anesthesia Evaluation  Patient identified by MRN, date of birth, ID band Patient awake    Reviewed: Allergy & Precautions, H&P , NPO status , Patient's Chart, lab work & pertinent test results  History of Anesthesia Complications (+) PONV, PROLONGED EMERGENCE and history of anesthetic complications  Airway Mallampati: II  TM Distance: >3 FB Neck ROM: full    Dental  (+) Teeth Intact   Pulmonary neg COPD,  +snoring   breath sounds clear to auscultation       Cardiovascular Exercise Tolerance: Poor hypertension, (-) angina(-) Past MI (-) dysrhythmias  Rhythm:regular Rate:Normal     Neuro/Psych negative neurological ROS  negative psych ROS   GI/Hepatic Neg liver ROS, GERD  Controlled,H/o colon cancer   Endo/Other  negative endocrine ROS  Renal/GU    H/o endometrial cancer    Musculoskeletal   Abdominal   Peds  Hematology negative hematology ROS (+)   Anesthesia Other Findings Past Medical History: No date: Arthritis     Comment:  bitateral knees and left foot,s/p cortisone injection in              past No date: Chronic constipation 2018: Colon cancer ([place])     Comment:  pt is undergoing chemo [id number] No date: Complication of anesthesia     Comment:   slow to wake up  2014: Endometrial cancer ([place])     Comment:  Total Hysterectomy and Rad tx's.  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 No date: Menopause     Comment:  age 79 No date: PONV (postoperative nausea and vomiting) 06/07/10: Vitamin D deficiency  Past Surgical History: 12/04/2019: BREAST BIOPSY; Right     Comment:  [place] bx-INVASIVE MAMMARY CARCINOMA 01/03/2020: BREAST  LUMPECTOMY; Right No date: BUNIONECTOMY 09/24/2018: CATARACT EXTRACTION W/PHACO; Left     Comment:  Procedure: CATARACT EXTRACTION PHACO AND INTRAOCULAR               LENS PLACEMENT ([place]) LEFT;  Surgeon: Eulogio Bear,               MD;  Location: [place];  Service:               Ophthalmology;  Laterality: Left; 10/15/2018: CATARACT EXTRACTION W/PHACO; Right     Comment:  Procedure: CATARACT EXTRACTION PHACO AND INTRAOCULAR               LENS PLACEMENT ([facility]) RIGHT;  Surgeon: Eulogio Bear,              MD;  Location: [place];  Service:               Ophthalmology;  Laterality: Right; No date: cataract surgery     Comment:  both eyes 04/04/2017: COLONOSCOPY WITH PROPOFOL; N/A     Comment:  Procedure: COLONOSCOPY WITH PROPOFOL;  Surgeon: [name], MD;  Location: [facility] ENDOSCOPY;  Service:               Endoscopy;  Laterality: N/A; 05/22/2018: COLONOSCOPY WITH PROPOFOL; N/A     Comment:  Procedure: COLONOSCOPY WITH  PROPOFOL;  Surgeon: [name], MD;  Location: [facility] ENDOSCOPY;  Service:               Endoscopy;  Laterality: N/A; 1971: DILATION AND CURETTAGE OF UTERUS No date: FOOT SURGERY No date: NASAL SINUS SURGERY 07/12/2017: PORTACATH PLACEMENT; Right     Comment:  Procedure: INSERTION PORT-A-CATH;  Surgeon: Jules Husbands, MD;  Location: [facility] ORS;  Service: General;                Laterality: Right; 06/07/2017: ROBOTIC ASSISTED LAPAROSCOPIC VENTRAL/INCISIONAL HERNIA  REPAIR; N/A     Comment:  Procedure: ROBOTIC [place];  Surgeon:               Leighton Ruff, MD;  Location: [facility] ORS;  Service: General;              Laterality: N/A; No date: VAGINAL DELIVERY No date: VAGINAL HYSTERECTOMY     Reproductive/Obstetrics negative OB ROS                            Anesthesia Physical Anesthesia Plan  ASA: III  Anesthesia Plan: General ETT   Post-op Pain  Management:    Induction:   PONV Risk Score and Plan: Ondansetron, Dexamethasone, Treatment may vary due to age or medical condition, Propofol infusion and TIVA  Airway Management Planned:   Additional Equipment:   Intra-op Plan:   Post-operative Plan:   Informed Consent: I have reviewed the patients History and Physical, chart, labs and discussed the procedure including the risks, benefits and alternatives for the proposed anesthesia with the patient or authorized representative who has indicated his/her understanding and acceptance.     Dental Advisory Given  Plan Discussed with: Anesthesiologist  Anesthesia Plan Comments:        Anesthesia Quick Evaluation

## 2020-01-06 ENCOUNTER — Encounter: Payer: Self-pay | Admitting: *Deleted

## 2020-01-06 ENCOUNTER — Telehealth: Payer: Self-pay | Admitting: General Surgery

## 2020-01-06 NOTE — Progress Notes (Signed)
Talked to patient today.  She is doing well post surgery on Friday.  States "i've only been sore and no pain".  No needs at this time.

## 2020-01-06 NOTE — Telephone Encounter (Signed)
Patient is calling and is asking for one of the nurses to give her a call when you have a chance. Please call patient and advise.

## 2020-01-06 NOTE — Telephone Encounter (Signed)
Spoke with patient to gather more information for the reason of her call. Patient states she was calling to see if Dr.Cannon has received the results from her surgery. Per pt's chart it states the surgical pathology was still "in process." Notified patient once results received pt would receive a call. Pt verbalized understanding.

## 2020-01-08 ENCOUNTER — Other Ambulatory Visit: Payer: Self-pay | Admitting: Anatomic Pathology & Clinical Pathology

## 2020-01-08 LAB — SURGICAL PATHOLOGY

## 2020-01-09 ENCOUNTER — Telehealth: Payer: Self-pay | Admitting: General Surgery

## 2020-01-09 ENCOUNTER — Other Ambulatory Visit: Payer: Self-pay | Admitting: Emergency Medicine

## 2020-01-09 NOTE — Telephone Encounter (Signed)
Patient called stating she was reviewing her surgical report & is requesting a call back to discuss it further, please.  Thank you

## 2020-01-09 NOTE — Telephone Encounter (Signed)
Patient states that she has already spoken with Dr Celine Ahr.

## 2020-01-09 NOTE — Telephone Encounter (Signed)
Discussed pathology and plans for next steps with patient.

## 2020-01-09 NOTE — Telephone Encounter (Signed)
Relayed to patient that Dr. Tasia Catchings agrees with breast MRI. Will have clinic staff arrange.

## 2020-01-10 ENCOUNTER — Telehealth: Payer: Self-pay | Admitting: Emergency Medicine

## 2020-01-10 ENCOUNTER — Telehealth: Payer: Self-pay

## 2020-01-10 ENCOUNTER — Other Ambulatory Visit: Payer: Self-pay | Admitting: Emergency Medicine

## 2020-01-10 DIAGNOSIS — C50911 Malignant neoplasm of unspecified site of right female breast: Secondary | ICD-10-CM

## 2020-01-10 NOTE — Telephone Encounter (Signed)
Oncotype Dx submitted online for specimen [id number] collected on 01/03/20.  ORD: [id number]

## 2020-01-10 NOTE — Telephone Encounter (Signed)
Pt is scheduled for R breast MRI on 01/22/20. Per Aldona Bar pt is aware of appt.

## 2020-01-13 ENCOUNTER — Encounter: Payer: Self-pay | Admitting: Oncology

## 2020-01-14 ENCOUNTER — Other Ambulatory Visit: Payer: Self-pay

## 2020-01-16 ENCOUNTER — Encounter: Payer: Self-pay | Admitting: General Surgery

## 2020-01-16 ENCOUNTER — Ambulatory Visit (INDEPENDENT_AMBULATORY_CARE_PROVIDER_SITE_OTHER): Payer: Self-pay | Admitting: General Surgery

## 2020-01-16 ENCOUNTER — Other Ambulatory Visit: Payer: Self-pay

## 2020-01-16 VITALS — BP 156/78 | HR 69 | Temp 97.7°F | Resp 14 | Ht 65.0 in | Wt 225.0 lb

## 2020-01-16 DIAGNOSIS — C50911 Malignant neoplasm of unspecified site of right female breast: Secondary | ICD-10-CM

## 2020-01-16 DIAGNOSIS — Z170 Estrogen receptor positive status [ER+]: Secondary | ICD-10-CM

## 2020-01-16 MED ORDER — CEPHALEXIN 500 MG PO CAPS: 500.0000 mg | ORAL_CAPSULE | Freq: Four times a day (QID) | ORAL | 0 refills | Status: AC

## 2020-01-16 NOTE — Patient Instructions (Signed)
Keep your Breast MRI appointment on 01/22/20.  Please pick up your prescription (Keflex) at your local pharmacy.   Continue to shower and clean the area with soap and water. Change the dressing once daily. Continue to use the binder or a sports bra for at least one more week.   Dr Celine Ahr will contact you after you have completed your MRI and give you further instructions on what happens next.   Please call the office if you have any questions or concerns.   Lumpectomy, Care After This sheet gives you information about how to care for yourself after your procedure. Your health care provider may also give you more specific instructions. If you have problems or questions, contact your health care provider. What can I expect after the procedure? After the procedure, it is common to have:  Breast swelling.  Breast tenderness.  Stiffness in your arm or shoulder.  A change in the shape and feel of your breast.  Scar tissue that feels hard to the touch in the area where the lump was removed. Follow these instructions at home: Medicines  Take over-the-counter and prescription medicines only as told by your health care provider.  If you were prescribed an antibiotic medicine, take it as told by your health care provider. Do not stop taking the antibiotic even if you start to feel better.  Ask your health care provider if the medicine prescribed to you: ? Requires you to avoid driving or using heavy machinery. ? Can cause constipation. You may need to take these actions to prevent or treat constipation:  Drink enough fluid to keep your urine pale yellow.  Take over-the-counter or prescription medicines.  Eat foods that are high in fiber, such as beans, whole grains, and fresh fruits and vegetables.  Limit foods that are high in fat and processed sugars, such as fried or sweet foods. Incision care      Follow instructions from your health care provider about how to take care of your  incision. Make sure you: ? Wash your hands with soap and water before and after you change your bandage (dressing). If soap and water are not available, use hand sanitizer. ? Change your dressing as told by your health care provider. ? Leave stitches (sutures), skin glue, or adhesive strips in place. These skin closures may need to stay in place for 2 weeks or longer. If adhesive strip edges start to loosen and curl up, you may trim the loose edges. Do not remove adhesive strips completely unless your health care provider tells you to do that.  Check your incision area every day for signs of infection. Check for: ? More redness, swelling, or pain. ? Fluid or blood. ? Warmth. ? Pus or a bad smell.  Keep your dressing clean and dry.  If you were sent home with a surgical drain in place, follow instructions from your health care provider about emptying it. Bathing  Do not take baths, swim, or use a hot tub until your health care provider approves.  Ask your health care provider if you may take showers. You may only be allowed to take sponge baths. Activity  Rest as told by your health care provider.  Avoid sitting for a long time without moving. Get up to take short walks every 1-2 hours. This is important to improve blood flow and breathing. Ask for help if you feel weak or unsteady.  Return to your normal activities as told by your health care provider. Ask  your health care provider what activities are safe for you.  Be careful to avoid any activities that could cause an injury to your arm on the side of your surgery.  Do not lift anything that is heavier than 10 lb (4.5 kg), or the limit that you are told, until your health care provider says that it is safe. Avoid lifting with the arm that is on the side of your surgery.  Do not carry heavy objects on your shoulder on the side of your surgery.  Do exercises to keep your shoulder and arm from getting stiff and swollen. Talk with your  health care provider about which exercises are safe for you. General instructions  Wear a supportive bra as told by your health care provider.  Raise (elevate) your arm above the level of your heart while you are sitting or lying down.  Do not wear tight jewelry on your arm, wrist, or fingers on the side of your surgery.  Keep all follow-up visits as told by your health care provider. This is important. ? You may need to be screened for extra fluid around the lymph nodes and swelling in the breast and arm (lymphedema). Follow instructions from your health care provider about how often you should be checked.  If you had any lymph nodes removed during your procedure, be sure to tell all of your health care providers. This is important information to share before you are involved in certain procedures, such as having blood tests or having your blood pressure taken. Contact a health care provider if:  You develop a rash.  You have a fever.  Your pain medicine is not working.  You have swelling, weakness, or numbness in your arm that does not improve after a few weeks.  You have new swelling in your breast.  You have any of these signs of infection: ? More redness, swelling, or pain in your incision area. ? Fluid or blood coming from your incision. ? Warmth coming from the incision area. ? Pus or a bad smell coming from your incision. Get help right away if you have:  Very bad pain in your breast or arm.  Swelling in your legs or arms.  Redness, warmth, or pain in your leg or arm.  Chest pain.  Difficulty breathing. Summary  After the procedure, it is common to have breast tenderness, swelling in your breast, and stiffness in your arm and shoulder.  Follow instructions from your health care provider about how to take care of your incision.  Do not lift anything that is heavier than 10 lb (4.5 kg), or the limit that you are told, until your health care provider says that it  is safe. Avoid lifting with the arm that is on the side of your surgery.  If you had any lymph nodes removed during your procedure, be sure to tell all of your health care providers. This is important information to share before you are involved in certain procedures, such as having blood tests or having your blood pressure taken. This information is not intended to replace advice given to you by your health care provider. Make sure you discuss any questions you have with your health care provider. Document Revised: 05/06/2019 Document Reviewed: 05/06/2019 Elsevier Patient Education  [place].

## 2020-01-17 DIAGNOSIS — Z170 Estrogen receptor positive status [ER+]: Secondary | ICD-10-CM | POA: Diagnosis not present

## 2020-01-17 DIAGNOSIS — C50511 Malignant neoplasm of lower-outer quadrant of right female breast: Secondary | ICD-10-CM | POA: Diagnosis not present

## 2020-01-17 NOTE — Progress Notes (Signed)
[name] is here today for a postoperative visit.  She is a 79 year old woman with breast cancer.  She underwent a wire localized lumpectomy and sentinel lymph node biopsy on January 03, 2020.  Her final pathology is copied here:  wk ago   SURGICAL PATHOLOGY SURGICAL PATHOLOGY  CASE: [id number]  PATIENT: [name]  Surgical Pathology Report      Specimen Submitted:  A. Breast, right  B. Sentinel lymph node 1  C. Sentinel lymph node 2  D. Sentinel lymph node 3   Clinical History: Breast cancer     DIAGNOSIS:  A. BREAST, RIGHT; PARTIAL MASTECTOMY:  - INVASIVE MAMMARY CARCINOMA, NO SPECIAL TYPE.  - CLIP AND BIOPSY SITE IDENTIFIED.  - SEE CANCER SUMMARY.   B. LYMPH NODE, RIGHT SENTINEL #1; EXCISION:  - ONE LYMPH NODE, NEGATIVE FOR MALIGNANCY (0/1).   C. LYMPH NODES, RIGHT SENTINEL #2; EXCISION:  - TWO LYMPH NODES, NEGATIVE FOR MALIGNANCY (0/2).   D. LYMPH NODE, RIGHT SENTINEL #3; EXCISION:  - ONE LYMPH NODE, NEGATIVE FOR MALIGNANCY (0/1).   Comment:  A focal area initially appeared concerning for lymphovascular invasion.  Immunohistochemical studies directed against D2-40 and CD34 were  performed, and show no evidence of lymphovascular invasion.   Given the presence of this concerning area, immunohistochemical studies  directed against pancytokeratin were also performed on all sampled lymph  nodes, and are negative for metastatic carcinoma.   CANCER CASE SUMMARY: INVASIVE CARCINOMA OF THE BREAST  Procedure: Excision  Specimen Laterality: Right  Tumor Size: 14 mm  Histologic Type: Invasive carcinoma of no special type (ductal)  Histologic Grade (Nottingham Histologic Score)            Glandular (Acinar)/Tubular Differentiation: 3            Nuclear Pleomorphism: 3            Mitotic Rate: 2            Overall Grade: Grade 3  Ductal Carcinoma In Situ (DCIS): Present, high-grade with comedonecrosis  Margins:             Invasive Carcinoma Margins: Uninvolved by invasive  carcinoma            Distance from closest margin: 1 mm            Specify closest margin: Posterior             DCIS Margins: Positive for DCIS            Multifocal, superior   Regional Lymph Nodes: Uninvolved by tumor cells           Total Number of Lymph Nodes Examined: 4            Number of Sentinel Nodes Examined: 4   Treatment Effect in the Breast: No known presurgical therapy  Lymphovascular Invasion: Not identified  Pathologic Stage Classification (pTNM, AJCC 8th Edition): pT1c pN0 (sn)  TNM Descriptors:  Breast Biomarker Testing Performed on Previous Biopsy: [id number]   BREAST BIOMARKER TESTS  Estrogen Receptor (ER) Status: POSITIVE            Percentage of cells with nuclear positivity: >90%            Average intensity of staining: Strong   Progesterone Receptor (PgR) Status: POSITIVE            Percentage of cells with nuclear positivity: >90%            Average intensity of staining: Strong  HER2 (by immunohistochemistry): NEGATIVE (Score 0)     Unfortunately, she has a positive superior margin with multifocal DCIS.  She is here today for a postoperative visit as well as to discuss further management.  It appears, that she did not fully understand her discharge instructions.  She has not removed her breast binder nor has she changed the gauze fluffs that were placed at the time of surgery.  She has not showered or even exposed the skin to air since her procedure.  She states that her pain has been well controlled.  She denies any fevers or chills.  No nausea or vomiting.  She reports only mild tenderness to the surgical incision sites.  Physical examination: Today's Vitals   01/16/20 1106  BP: (!) 156/78  Pulse: 69  Resp: 14  Temp: 97.7 F (36.5 C)  SpO2: 97%  Weight: 225 lb (102.1  kg)  Height: _0  (1.651 m)  PainSc: 0-No pain   Body mass index is 37.44 kg/m. The binder and dressings were removed.  There is some residual blue dye staining of the skin.  There is also an area that appears to be possible methylene blue-induced skin necrosis at the right upper quadrant of the breast.  There is no frank purulence and the injury appears to be fairly superficial.  The Steri-Strips were removed from both the axillary and breast incision sites.  The skin is fairly erythematous.  There is no fluctuance or frank purulent drainage.  No concern for hematoma or seroma.  Impression and plan: This is a 79 year old woman who had a core biopsy of a breast abnormality that returned as positive for invasive breast cancer.  She underwent a wire localized lumpectomy but does have a superior margin with multifocal DCIS present.  Her sentinel nodes were all negative.  She is currently scheduled to undergo a breast MRI on January 22, 2020, in order to ascertain the extent of the DCIS so that any subsequent operations are thorough.  This could potentially include the possibility of a mastectomy if it is found that she has diffuse disease.  She is also scheduled to be discussed in breast tumor board on Monday, March 8.  Due to my concern for possible infection, I have placed her on Keflex 500 mg 4 times daily for 10 days.  I will contact her after I have the results of her MRI and we will discuss the next surgical steps.

## 2020-01-17 NOTE — H&P (View-Only) (Signed)
[name] is here today for a postoperative visit.  She is a 79 year old woman with breast cancer.  She underwent a wire localized lumpectomy and sentinel lymph node biopsy on January 03, 2020.  Her final pathology is copied here:  wk ago   SURGICAL PATHOLOGY SURGICAL PATHOLOGY  CASE: [id number]  PATIENT: [name]  Surgical Pathology Report      Specimen Submitted:  A. Breast, right  B. Sentinel lymph node 1  C. Sentinel lymph node 2  D. Sentinel lymph node 3   Clinical History: Breast cancer     DIAGNOSIS:  A. BREAST, RIGHT; PARTIAL MASTECTOMY:  - INVASIVE MAMMARY CARCINOMA, NO SPECIAL TYPE.  - CLIP AND BIOPSY SITE IDENTIFIED.  - SEE CANCER SUMMARY.   B. LYMPH NODE, RIGHT SENTINEL #1; EXCISION:  - ONE LYMPH NODE, NEGATIVE FOR MALIGNANCY (0/1).   C. LYMPH NODES, RIGHT SENTINEL #2; EXCISION:  - TWO LYMPH NODES, NEGATIVE FOR MALIGNANCY (0/2).   D. LYMPH NODE, RIGHT SENTINEL #3; EXCISION:  - ONE LYMPH NODE, NEGATIVE FOR MALIGNANCY (0/1).   Comment:  A focal area initially appeared concerning for lymphovascular invasion.  Immunohistochemical studies directed against D2-40 and CD34 were  performed, and show no evidence of lymphovascular invasion.   Given the presence of this concerning area, immunohistochemical studies  directed against pancytokeratin were also performed on all sampled lymph  nodes, and are negative for metastatic carcinoma.   CANCER CASE SUMMARY: INVASIVE CARCINOMA OF THE BREAST  Procedure: Excision  Specimen Laterality: Right  Tumor Size: 14 mm  Histologic Type: Invasive carcinoma of no special type (ductal)  Histologic Grade (Nottingham Histologic Score)            Glandular (Acinar)/Tubular Differentiation: 3            Nuclear Pleomorphism: 3            Mitotic Rate: 2            Overall Grade: Grade 3  Ductal Carcinoma In Situ (DCIS): Present, high-grade with comedonecrosis  Margins:             Invasive Carcinoma Margins: Uninvolved by invasive  carcinoma            Distance from closest margin: 1 mm            Specify closest margin: Posterior             DCIS Margins: Positive for DCIS            Multifocal, superior   Regional Lymph Nodes: Uninvolved by tumor cells           Total Number of Lymph Nodes Examined: 4            Number of Sentinel Nodes Examined: 4   Treatment Effect in the Breast: No known presurgical therapy  Lymphovascular Invasion: Not identified  Pathologic Stage Classification (pTNM, AJCC 8th Edition): pT1c pN0 (sn)  TNM Descriptors:  Breast Biomarker Testing Performed on Previous Biopsy: [id number]   BREAST BIOMARKER TESTS  Estrogen Receptor (ER) Status: POSITIVE            Percentage of cells with nuclear positivity: >90%            Average intensity of staining: Strong   Progesterone Receptor (PgR) Status: POSITIVE            Percentage of cells with nuclear positivity: >90%            Average intensity of staining: Strong  HER2 (by immunohistochemistry): NEGATIVE (Score 0)     Unfortunately, she has a positive superior margin with multifocal DCIS.  She is here today for a postoperative visit as well as to discuss further management.  It appears, that she did not fully understand her discharge instructions.  She has not removed her breast binder nor has she changed the gauze fluffs that were placed at the time of surgery.  She has not showered or even exposed the skin to air since her procedure.  She states that her pain has been well controlled.  She denies any fevers or chills.  No nausea or vomiting.  She reports only mild tenderness to the surgical incision sites.  Physical examination: Today's Vitals   01/16/20 1106  BP: (!) 156/78  Pulse: 69  Resp: 14  Temp: 97.7 F (36.5 C)  SpO2: 97%  Weight: 225 lb (102.1  kg)  Height: _0  (1.651 m)  PainSc: 0-No pain   Body mass index is 37.44 kg/m. The binder and dressings were removed.  There is some residual blue dye staining of the skin.  There is also an area that appears to be possible methylene blue-induced skin necrosis at the right upper quadrant of the breast.  There is no frank purulence and the injury appears to be fairly superficial.  The Steri-Strips were removed from both the axillary and breast incision sites.  The skin is fairly erythematous.  There is no fluctuance or frank purulent drainage.  No concern for hematoma or seroma.  Impression and plan: This is a 79 year old woman who had a core biopsy of a breast abnormality that returned as positive for invasive breast cancer.  She underwent a wire localized lumpectomy but does have a superior margin with multifocal DCIS present.  Her sentinel nodes were all negative.  She is currently scheduled to undergo a breast MRI on January 22, 2020, in order to ascertain the extent of the DCIS so that any subsequent operations are thorough.  This could potentially include the possibility of a mastectomy if it is found that she has diffuse disease.  She is also scheduled to be discussed in breast tumor board on Monday, March 8.  Due to my concern for possible infection, I have placed her on Keflex 500 mg 4 times daily for 10 days.  I will contact her after I have the results of her MRI and we will discuss the next surgical steps.

## 2020-01-20 ENCOUNTER — Encounter: Payer: Self-pay | Admitting: Oncology

## 2020-01-21 ENCOUNTER — Encounter: Payer: Self-pay | Admitting: Oncology

## 2020-01-21 NOTE — Telephone Encounter (Signed)
Oncotype report has been sent to scan under media tab.

## 2020-01-22 ENCOUNTER — Other Ambulatory Visit: Payer: Self-pay

## 2020-01-22 ENCOUNTER — Ambulatory Visit
Admission: RE | Admit: 2020-01-22 | Discharge: 2020-01-22 | Disposition: A | Discharge status: Home / Self Care | Payer: PPO | Source: Ambulatory Visit | Attending: General Surgery | Admitting: General Surgery

## 2020-01-22 DIAGNOSIS — N6489 Other specified disorders of breast: Secondary | ICD-10-CM | POA: Diagnosis not present

## 2020-01-22 DIAGNOSIS — Z853 Personal history of malignant neoplasm of breast: Secondary | ICD-10-CM | POA: Diagnosis not present

## 2020-01-22 DIAGNOSIS — Z170 Estrogen receptor positive status [ER+]: Secondary | ICD-10-CM | POA: Diagnosis not present

## 2020-01-22 DIAGNOSIS — C50911 Malignant neoplasm of unspecified site of right female breast: Secondary | ICD-10-CM | POA: Diagnosis not present

## 2020-01-22 LAB — POCT I-STAT CREATININE: Creatinine, Ser: 0.9 mg/dL (ref 0.44–1.00)

## 2020-01-22 MED ORDER — GADOBUTROL 1 MMOL/ML IV SOLN
10.0000 mL | Freq: Once | INTRAVENOUS | Status: AC | PRN
  Administered 2020-01-22: 10 mL via INTRAVENOUS

## 2020-01-23 ENCOUNTER — Other Ambulatory Visit: Payer: Self-pay | Admitting: General Surgery

## 2020-01-23 ENCOUNTER — Telehealth: Payer: Self-pay | Admitting: Emergency Medicine

## 2020-01-23 ENCOUNTER — Telehealth: Payer: Self-pay | Admitting: General Surgery

## 2020-01-23 DIAGNOSIS — R9389 Abnormal findings on diagnostic imaging of other specified body structures: Secondary | ICD-10-CM

## 2020-01-23 NOTE — Telephone Encounter (Signed)
Per Dr Celine Ahr, pt needs to be scheduled for a MRI guided bx of Left breast.   Pt is scheduled on Friday January 31, 2020. Arrival 8am at the [facility] Pt notified of this appt and given instructions and address on where to be at. Pt voiced understanding and has no further questions.   Will make Dr Celine Ahr aware of the above at this time.

## 2020-01-23 NOTE — Telephone Encounter (Signed)
I discussed the results of the patient's MRI.  Will arrange biopsy.

## 2020-01-24 ENCOUNTER — Ambulatory Visit: Payer: PPO | Admitting: Oncology

## 2020-01-24 DIAGNOSIS — G250 Essential tremor: Secondary | ICD-10-CM | POA: Diagnosis not present

## 2020-01-27 ENCOUNTER — Other Ambulatory Visit: Payer: Self-pay

## 2020-01-27 ENCOUNTER — Ambulatory Visit
Admission: RE | Admit: 2020-01-27 | Discharge: 2020-01-27 | Disposition: A | Discharge status: Home / Self Care | Payer: PPO | Source: Ambulatory Visit | Attending: Oncology | Admitting: Oncology

## 2020-01-27 DIAGNOSIS — Z85038 Personal history of other malignant neoplasm of large intestine: Secondary | ICD-10-CM | POA: Insufficient documentation

## 2020-01-27 DIAGNOSIS — Z85048 Personal history of other malignant neoplasm of rectum, rectosigmoid junction, and anus: Secondary | ICD-10-CM | POA: Diagnosis not present

## 2020-01-27 DIAGNOSIS — I700 Atherosclerosis of aorta: Secondary | ICD-10-CM | POA: Diagnosis not present

## 2020-01-27 MED ORDER — IOHEXOL 300 MG/ML  SOLN
100.0000 mL | Freq: Once | INTRAMUSCULAR | Status: AC | PRN
  Administered 2020-01-27: 100 mL via INTRAVENOUS

## 2020-01-28 ENCOUNTER — Encounter: Payer: Self-pay | Admitting: Oncology

## 2020-01-28 NOTE — Progress Notes (Signed)
Patient is coming in  For follow up she is doing well no complaints

## 2020-01-29 ENCOUNTER — Inpatient Hospital Stay (HOSPITAL_BASED_OUTPATIENT_CLINIC_OR_DEPARTMENT_OTHER): Payer: PPO | Admitting: Oncology

## 2020-01-29 ENCOUNTER — Inpatient Hospital Stay: Payer: PPO | Attending: Oncology

## 2020-01-29 VITALS — BP 146/56 | HR 60 | Temp 97.2°F

## 2020-01-29 DIAGNOSIS — I700 Atherosclerosis of aorta: Secondary | ICD-10-CM | POA: Diagnosis not present

## 2020-01-29 DIAGNOSIS — Z79899 Other long term (current) drug therapy: Secondary | ICD-10-CM | POA: Diagnosis not present

## 2020-01-29 DIAGNOSIS — C50911 Malignant neoplasm of unspecified site of right female breast: Secondary | ICD-10-CM

## 2020-01-29 DIAGNOSIS — C50511 Malignant neoplasm of lower-outer quadrant of right female breast: Secondary | ICD-10-CM | POA: Insufficient documentation

## 2020-01-29 DIAGNOSIS — Z85038 Personal history of other malignant neoplasm of large intestine: Secondary | ICD-10-CM | POA: Insufficient documentation

## 2020-01-29 DIAGNOSIS — Z7901 Long term (current) use of anticoagulants: Secondary | ICD-10-CM | POA: Diagnosis not present

## 2020-01-29 DIAGNOSIS — Z9221 Personal history of antineoplastic chemotherapy: Secondary | ICD-10-CM | POA: Diagnosis not present

## 2020-01-29 DIAGNOSIS — Z95828 Presence of other vascular implants and grafts: Secondary | ICD-10-CM

## 2020-01-29 DIAGNOSIS — Z170 Estrogen receptor positive status [ER+]: Secondary | ICD-10-CM

## 2020-01-29 DIAGNOSIS — I82441 Acute embolism and thrombosis of right tibial vein: Secondary | ICD-10-CM | POA: Diagnosis not present

## 2020-01-29 DIAGNOSIS — Z86718 Personal history of other venous thrombosis and embolism: Secondary | ICD-10-CM | POA: Insufficient documentation

## 2020-01-29 DIAGNOSIS — Z9011 Acquired absence of right breast and nipple: Secondary | ICD-10-CM | POA: Diagnosis not present

## 2020-01-29 LAB — COMPREHENSIVE METABOLIC PANEL
ALT: 15 U/L (ref 0–44)
AST: 22 U/L (ref 15–41)
Albumin: 3.7 g/dL (ref 3.5–5.0)
Alkaline Phosphatase: 98 U/L (ref 38–126)
Anion gap: 8 (ref 5–15)
BUN: 14 mg/dL (ref 8–23)
CO2: 21 mmol/L — ABNORMAL LOW (ref 22–32)
Calcium: 8.9 mg/dL (ref 8.9–10.3)
Chloride: 111 mmol/L (ref 98–111)
Creatinine, Ser: 1.03 mg/dL — ABNORMAL HIGH (ref 0.44–1.00)
GFR calc Af Amer: >60 mL/min (ref >60)
GFR calc non Af Amer: 52 mL/min — ABNORMAL LOW (ref >60)
Glucose, Bld: 147 mg/dL — ABNORMAL HIGH (ref 70–99)
Potassium: 3.8 mmol/L (ref 3.5–5.1)
Sodium: 140 mmol/L (ref 135–145)
Total Bilirubin: 0.6 mg/dL (ref 0.3–1.2)
Total Protein: 6.8 g/dL (ref 6.5–8.1)

## 2020-01-29 LAB — CBC WITH DIFFERENTIAL/PLATELET
Abs Immature Granulocytes: 0.02 10*3/uL (ref 0.00–0.07)
Basophils Absolute: 0 10*3/uL (ref 0.0–0.1)
Basophils Relative: 1 %
Eosinophils Absolute: 0.3 10*3/uL (ref 0.0–0.5)
Eosinophils Relative: 7 %
HCT: 38.9 % (ref 36.0–46.0)
Hemoglobin: 12.6 g/dL (ref 12.0–15.0)
Immature Granulocytes: 1 %
Lymphocytes Relative: 31 %
Lymphs Abs: 1.3 10*3/uL (ref 0.7–4.0)
MCH: 29 pg (ref 26.0–34.0)
MCHC: 32.4 g/dL (ref 30.0–36.0)
MCV: 89.4 fL (ref 80.0–100.0)
Monocytes Absolute: 0.3 10*3/uL (ref 0.1–1.0)
Monocytes Relative: 7 %
Neutro Abs: 2.3 10*3/uL (ref 1.7–7.7)
Neutrophils Relative %: 53 %
Platelets: 225 10*3/uL (ref 150–400)
RBC: 4.35 MIL/uL (ref 3.87–5.11)
RDW: 14.3 % (ref 11.5–15.5)
WBC: 4.3 10*3/uL (ref 4.0–10.5)
nRBC: 0 % (ref 0.0–0.2)

## 2020-01-30 ENCOUNTER — Encounter: Payer: Self-pay | Admitting: Family

## 2020-01-30 LAB — CEA: CEA: 1.4 ng/mL (ref 0.0–4.7)

## 2020-01-30 NOTE — Progress Notes (Signed)
Hematology/Oncology Follow Up visit. [place] [phone] [phone]  CONSULT NOTE Patient Care Team: Burnard Hawthorne, FNP as PCP - General (Family Medicine) Vladimir Crofts, MD (Neurology) Noreene Filbert, MD as Referring Physician (Radiation Oncology) Leighton Ruff, MD as Consulting Physician (General Surgery) Earlie Server, MD as Consulting Physician (Oncology) Jules Husbands, MD as Consulting Physician (General Surgery) Rico Junker, RN as Registered Nurse Theodore Demark, RN as Registered Nurse  PURPOSE OF CONSULTATION:  Follow up for blood clot, and stage IIIB colon cancer  HISTORY OF PRESENTING ILLNESS:  [name] 79 y.o.  female with PMH listed as below was referred to me for evaluation and management of colon cancer.  She had colonoscopy evaluation due to positive cologuard.  She was found to have a nonobstructing mass approximately 20 cm from the anal verge which biopsy proved to be adenocarcinoma. CT scan showed a large mass from the sigmoid and extends beyond the wall of the colon toward the left into the surrounding fat. Preoperational CEA was normal. Patient underwent robotic sigmoidectomy on 06/07/2017.   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 MLH1  loss of function, PMS2 loss of function. Declined option of clinical trial ATOMIC She has a history of endometrial cancer s/p hysterotomy and radiation. She has a family history of colon cancer and breast cancer.  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 Results have been explained to patient by genetic counselor.   # Thyroid nodule, incidental finding from CT surveillance for colon cancer. Discussed with patient's primary care provider [name].  Patient to follow-up with PCP for further  evaluation with ultrasound thyroid.   Oncology treatment S/p adjuvant chemotherapy finished February 2019 Adjuvant RT finished in April 2019.  05/22/2018 Colonoscopy surveillance by Dr.Wohl showed 10 mm polyp in ascending colon.  Removed.  Patent end-to-end colo-colonic anastomosis Pathology showed benign polyp.  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   # mammogram on 11/19/2019.  Right breast mass warrants further evaluation. Right diagnostic mammogram showed 6 x 5 x 7 mm lobular hypoechoic mass, 7:00, 3 cm from nipple.  No right axillary adenopathy ultrasound-guided core needle biopsy was obtained. Pathology showed invasive mammary carcinoma, no special type.  Grade 3, ER/PR strongly positive.  90%, HER-2 negative.   INTERVAL HISTORY 79 yo female with above history presents for follow up per primary care provider for evaluation of newly diagnosed breast cancer.  Patient has history of colon cancer and history of unprovoked DVT. 01/03/2020 underwent right lumpectomy with SLNB Invasive mammary carcinoma, grade 3, DCIS present, margin is uninvolved by invasive carcinoma, positive for DCIS, multifocal, superior.  pT1c pN0 (sn).   Bilateral breast MRI was obtain for further evaluation of extent of remaining DCIS.  Image showed seroma with diffuse non mass enhancement in the lower outer right breast extending from mid to posterior depth. Skin thickening. no focally suspicious mass is identified along the margins of the seroma or in the remainder of the right breast. Indeterminate 1cm focus of linear enhancement in the central left breast.   # patient is scheduled to have MRI guided biopsy of left breast.  Also she plans to have right mastectomy and if left breast biopsy showed cancer, she plans to also have left mastectomy.   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   Port-A-Cath, she gets port flush every 6 to 8 weeks.    Review of Systems  Constitutional: Negative for appetite change, chills, fatigue and fever.  HENT:   Negative for hearing loss and voice change.   Eyes: Negative for eye problems.  Respiratory: Negative for chest tightness and cough.   Cardiovascular: Negative for chest pain and leg swelling.  Gastrointestinal: Negative for abdominal distention, abdominal pain, blood in stool and diarrhea.  Endocrine: Negative for hot flashes.  Genitourinary: Negative for difficulty urinating and frequency.   Musculoskeletal: Negative for arthralgias.  Skin: Negative for itching and rash.  Neurological: Negative for extremity weakness.  Hematological: Negative for adenopathy.  Psychiatric/Behavioral: Negative for confusion.   MEDICAL HISTORY:  Past Medical History:  Diagnosis Date  . Arthritis    bitateral knees and left foot,s/p cortisone injection in past  . Chronic constipation   . Colon cancer ([facility]) 2018   pt is undergoing chemo 11-09-17  . Complication of anesthesia     slow to wake up   . Endometrial cancer ([place]) 2014   Total Hysterectomy and Rad tx's.   . Family history of cancer   . Genetic testing 09/26/2017    Multi-Cancer panel (83 genes) @ [facility] - No pathogenic mutations detected  . GERD (gastroesophageal reflux disease)   . Hypertension   . IBS (irritable bowel syndrome)   . Macular degeneration of both eyes   . Menopause    age 70  . PONV (postoperative nausea and vomiting)   . Vitamin D deficiency 06/07/10    SURGICAL HISTORY: Past Surgical History:  Procedure Laterality Date  . BREAST BIOPSY Right 12/04/2019   [place] bx-INVASIVE MAMMARY CARCINOMA  . BREAST LUMPECTOMY Right 01/03/2020  . BREAST LUMPECTOMY WITH NEEDLE LOCALIZATION AND AXILLARY SENTINEL LYMPH NODE BX Right 01/03/2020   Procedure: BREAST LUMPECTOMY WITH NEEDLE LOCALIZATION AND AXILLARY SENTINEL LYMPH NODE BX;  Surgeon: Fredirick Maudlin, MD;   Location: [facility];  Service: General;  Laterality: Right;  . BUNIONECTOMY    . CATARACT EXTRACTION W/PHACO Left 09/24/2018   Procedure: CATARACT EXTRACTION PHACO AND INTRAOCULAR LENS PLACEMENT ([place]) LEFT;  Surgeon: Eulogio Bear, MD;  Location: [facility];  Service: Ophthalmology;  Laterality: Left;  . CATARACT EXTRACTION W/PHACO Right 10/15/2018   Procedure: CATARACT EXTRACTION PHACO AND INTRAOCULAR LENS PLACEMENT ([place]) RIGHT;  Surgeon: Eulogio Bear, MD;  Location: [facility];  Service: Ophthalmology;  Laterality: Right;  . cataract surgery     both eyes  . COLONOSCOPY WITH PROPOFOL N/A 04/04/2017   Procedure: COLONOSCOPY WITH PROPOFOL;  Surgeon: [name], MD;  Location: [facility];  Service: Endoscopy;  Laterality: N/A;  . COLONOSCOPY WITH PROPOFOL N/A 05/22/2018   Procedure: COLONOSCOPY WITH PROPOFOL;  Surgeon: [name], MD;  Location: [facility];  Service: Endoscopy;  Laterality: N/A;  . DILATION AND CURETTAGE OF UTERUS  1971  . FOOT SURGERY    . NASAL SINUS SURGERY    . PORTACATH PLACEMENT Right 07/12/2017   Procedure: INSERTION PORT-A-CATH;  Surgeon: Jules Husbands, MD;  Location: [facility];  Service: General;  Laterality: Right;  . ROBOTIC ASSISTED LAPAROSCOPIC VENTRAL/INCISIONAL HERNIA REPAIR N/A 06/07/2017   Procedure: ROBOTIC INCISIONAL HERNIA REPAIR;  Surgeon: Leighton Ruff, MD;  Location: [facility];  Service: General;  Laterality: N/A;  . VAGINAL DELIVERY    . VAGINAL HYSTERECTOMY      SOCIAL  HISTORY: Social History   Socioeconomic History  . Marital status: Divorced    Spouse name: Not on file  . Number of children: Not on file  . Years of education: Not on file  . Highest education level: Not on file  Occupational History  . Not on file  Tobacco Use  . Smoking status: Never Smoker  . Smokeless tobacco: Never Used  Substance and Sexual Activity  . Alcohol use: Yes    Comment: rarely - Holidays  . Drug use: No  . Sexual  activity: Never  Other Topics Concern  . Not on file  Social History Narrative   Exercise- aerobic exercise at home 3-4x week.       Lives in [place]. No pets.   Social Determinants of Health   Financial Resource Strain: Low Risk   . Difficulty of Paying Living Expenses: Not hard at all  Food Insecurity: No Food Insecurity  . Worried About Charity fundraiser in the Last Year: Never true  . Ran Out of Food in the Last Year: Never true  Transportation Needs: No Transportation Needs  . Lack of Transportation (Medical): No  . Lack of Transportation (Non-Medical): No  Physical Activity:   . Days of Exercise per Week:   . Minutes of Exercise per Session:   Stress: No Stress Concern Present  . Feeling of Stress : Not at all  Social Connections: Unknown  . Frequency of Communication with Friends and Family: More than three times a week  . Frequency of Social Gatherings with Friends and Family: More than three times a week  . Attends Religious Services: 1 to 4 times per year  . Active Member of Clubs or Organizations: Yes  . Attends Archivist Meetings: 1 to 4 times per year  . Marital Status: Not on file  Intimate Partner Violence: Not At Risk  . Fear of Current or Ex-Partner: No  . Emotionally Abused: No  . Physically Abused: No  . Sexually Abused: No    FAMILY HISTORY: Family History  Problem Relation Age of Onset  . Heart disease Father 65       massive MI  . Diabetes Father   . Bipolar disorder Sister   . Osteoporosis Sister   . Breast cancer Sister 12       currently 73  . Cancer Sister        breast  . Bipolar disorder Maternal Aunt   . Breast cancer Maternal Aunt        age at dx unknown; deceased 29s  . Colon cancer Maternal Grandfather 63       deceased 54  . Cancer Maternal Grandfather        breast  . Osteoporosis Mother   . Colon cancer Mother        age at dx unknown; deceased 63  . Cancer Maternal Grandmother        unk. type; deceased  34  . Breast cancer Paternal Aunt        deceased 28    ALLERGIES:  is allergic to oxycodone and codeine.  MEDICATIONS:  Current Outpatient Medications  Medication Sig Dispense Refill  . losartan (COZAAR) 50 MG tablet Take 1 tablet (50 mg total) by mouth daily. (Patient taking differently: Take 50 mg by mouth every morning. ) 90 tablet 3  . metoprolol succinate (TOPROL-XL) 100 MG 24 hr tablet TAKE 1 TABLET DAILY (Patient taking differently: Take 100 mg by mouth every evening. )  90 tablet 3  . Multiple Vitamin (MULTIVITAMIN WITH MINERALS) TABS tablet Take 1 tablet by mouth daily.    . Multiple Vitamins-Minerals (ICAPS AREDS 2 PO) Take 1 capsule by mouth 2 (two) times daily.     Marland Kitchen topiramate (TOPAMAX) 100 MG tablet Take by mouth.    . fluticasone (FLONASE) 50 MCG/ACT nasal spray Place 2 sprays into both nostrils daily. (Patient not taking: Reported on 01/28/2020) 16 g 2  . phenylephrine (SUDAFED PE) 10 MG TABS tablet Take 10 mg by mouth every 4 (four) hours as needed (for allergies/congestion.).     Marland Kitchen topiramate (TOPAMAX) 25 MG tablet Take 25 mg by mouth 2 (two) times daily. Take with 50 mg twice daily    . topiramate (TOPAMAX) 50 MG tablet TAKE 1 TABLET TWICE DAILY (Patient not taking: Take with 25 mg twice daily) 180 tablet 0   No current facility-administered medications for this visit.      Marland Kitchen  PHYSICAL EXAMINATION: ECOG PERFORMANCE STATUS: 0 - Asymptomatic Vitals:   01/29/20 0944  BP: (!) 146/56  Pulse: 60  Temp: (!) 97.2 F (36.2 C)   Filed Weights   Physical Exam Constitutional:      General: She is not in acute distress. HENT:     Head: Normocephalic and atraumatic.  Eyes:     General: No scleral icterus. Cardiovascular:     Rate and Rhythm: Normal rate and regular rhythm.     Heart sounds: Normal heart sounds.  Pulmonary:     Effort: Pulmonary effort is normal. No respiratory distress.     Breath sounds: No wheezing.  Abdominal:     General: Bowel sounds are  normal. There is no distension.     Palpations: Abdomen is soft.  Musculoskeletal:        General: No deformity. Normal range of motion.     Cervical back: Normal range of motion and neck supple.  Skin:    General: Skin is warm and dry.     Findings: No erythema or rash.  Neurological:     Mental Status: She is alert and oriented to person, place, and time. Mental status is at baseline.     Cranial Nerves: No cranial nerve deficit.     Coordination: Coordination normal.  Psychiatric:        Mood and Affect: Mood normal.   .    LABORATORY DATA:  I have reviewed the data as listed Lab Results  Component Value Date   WBC 4.3 01/29/2020   HGB 12.6 01/29/2020   HCT 38.9 01/29/2020   MCV 89.4 01/29/2020   PLT 225 01/29/2020   Recent Labs    08/02/19 0917 08/02/19 0917 08/12/19 1503 08/12/19 1503 10/31/19 0920 01/22/20 1105 01/29/20 0914  NA 141   < > 139  --  142  --  140  K 3.8   < > 4.2  --  4.2  --  3.8  CL 110   < > 108  --  112*  --  111  CO2 21*   < > 24  --  23  --  21*  GLUCOSE 135*   < > 79  --  123*  --  147*  BUN 18   < > 17  --  18  --  14  CREATININE 0.92   < > 0.89   < > 0.89 0.90 1.03*  CALCIUM 8.7*   < > 9.3  --  9.2  --  8.9  GFRNONAA  60*  --   --   --  >60  --  52*  GFRAA >60  --   --   --  >60  --  >60  PROT 6.3*  --   --   --  6.7  --  6.8  ALBUMIN 3.5  --   --   --  3.6  --  3.7  AST 23  --   --   --  22  --  22  ALT 16  --   --   --  17  --  15  ALKPHOS 97  --   --   --  104  --  98  BILITOT 0.5  --   --   --  0.7  --  0.6   < > = values in this interval not displayed.    RADIOGRAPHIC STUDIES: I have personally reviewed the radiological images as listed and agreed with the findings in the report. 04/12/2017 CT abdomen pelvis w contrast Large mass arising from the sigmoid colon. This mass extends beyond the wall of the colon toward the left into the surrounding fat. This lesion does not reach the left pelvic sidewall.No adenopathy or liver  lesions evident. No omental lesions appreciable. Sizable midline ventral hernia slightly superior to theumbilicus containing fat and a loop of colon without bowel compromise. Much smaller midline umbilical ventral hernia containingonly fat. There is a small hiatal hernia. No bowel obstruction.  No abscess.  Appendix region appears normal. No renal or ureteral calculus.  No hydronephrosis.There is aortoiliac atherosclerosis. There are foci of coronary artery calcification.  RADIOGRAPHIC STUDIES: I have personally reviewed the radiological images as listed and agreed with the findings in the report. CT Chest W Contrast  Result Date: 01/27/2020 CLINICAL DATA:  79 year old female with history of colorectal cancer. Follow-up study. Additional history of lumpectomy in the right breast. EXAM: CT CHEST, ABDOMEN, AND PELVIS WITH CONTRAST TECHNIQUE: Multidetector CT imaging of the chest, abdomen and pelvis was performed following the standard protocol during bolus administration of intravenous contrast. CONTRAST:  170m OMNIPAQUE IOHEXOL 300 MG/ML  SOLN COMPARISON:  None. FINDINGS: CT CHEST FINDINGS Cardiovascular: Heart size is normal. There is no significant pericardial fluid, thickening or pericardial calcification. There is aortic atherosclerosis, as well as atherosclerosis of the great vessels of the mediastinum and the coronary arteries, including calcified atherosclerotic plaque in the left main, left anterior descending and right coronary arteries. Right subclavian single-lumen porta cath with tip terminating in the distal superior vena cava. Mediastinum/Nodes: No pathologically enlarged mediastinal or hilar lymph nodes. Esophagus is unremarkable in appearance. No axillary lymphadenopathy. Lungs/Pleura: No suspicious pulmonary nodules or masses are noted. No acute consolidative airspace disease. No pleural effusions. Mild linear scarring in the lingula. Musculoskeletal: Postoperative changes of lumpectomy  are noted in the right breast. In the inferior aspect of the right breast there is a 4.1 x 2.8 cm low-attenuation area, which is presumably a post lumpectomy seroma. Surrounding architectural distortion and fat stranding. Skin thickening in the right breast. There are no aggressive appearing lytic or blastic lesions noted in the visualized portions of the skeleton. CT ABDOMEN PELVIS FINDINGS Hepatobiliary: No suspicious cystic or solid hepatic lesions. No intra or extrahepatic biliary ductal dilatation. Gallbladder is normal in appearance. Pancreas: No pancreatic mass. No pancreatic ductal dilatation. No pancreatic or peripancreatic fluid collections or inflammatory changes. Spleen: Unremarkable. Adrenals/Urinary Tract: Bilateral kidneys and adrenal glands are normal in appearance. No hydroureteronephrosis. Urinary bladder is normal in appearance.  Stomach/Bowel: Normal appearance of the stomach. No pathologic dilatation of small bowel or colon. Suture line near the rectosigmoid junction, presumably from prior partial colectomy. No unexpected soft tissue mass at the level of the suture line to suggest local recurrence of disease. The appendix is not confidently identified and may be surgically absent. Regardless, there are no inflammatory changes noted adjacent to the cecum to suggest the presence of an acute appendicitis at this time. Vascular/Lymphatic: Aortic atherosclerosis, without evidence of aneurysm or dissection in the abdominal or pelvic vasculature. No lymphadenopathy noted in the abdomen or pelvis. Reproductive: Status post hysterectomy. Ovaries are not confidently identified may be surgically absent or atrophic. Other: No significant volume of ascites.  No pneumoperitoneum. Musculoskeletal: There are no aggressive appearing lytic or blastic lesions noted in the visualized portions of the skeleton. IMPRESSION: 1. Status post partial colectomy at the rectosigmoid junction, with no findings to suggest local  recurrence of disease or definite metastatic disease in the chest, abdomen or pelvis. 2. New postoperative changes of lumpectomy in the right breast with a low-attenuation collection in the inferior aspect of the right breast which presumably reflects a postoperative seroma. Correlation with recent 01/22/2020 is recommended. Breast MRI 3. Aortic atherosclerosis, in addition to left main and 2 vessel coronary artery disease. Assessment for potential risk factor modification, dietary therapy or pharmacologic therapy may be warranted, if clinically indicated. 4. Additional incidental findings, as above. Electronically Signed   By: Vinnie Langton M.D.   On: 01/27/2020 09:36   CT Abdomen Pelvis W Contrast  Result Date: 01/27/2020 CLINICAL DATA:  79 year old female with history of colorectal cancer. Follow-up study. Additional history of lumpectomy in the right breast. EXAM: CT CHEST, ABDOMEN, AND PELVIS WITH CONTRAST TECHNIQUE: Multidetector CT imaging of the chest, abdomen and pelvis was performed following the standard protocol during bolus administration of intravenous contrast. CONTRAST:  159m OMNIPAQUE IOHEXOL 300 MG/ML  SOLN COMPARISON:  None. FINDINGS: CT CHEST FINDINGS Cardiovascular: Heart size is normal. There is no significant pericardial fluid, thickening or pericardial calcification. There is aortic atherosclerosis, as well as atherosclerosis of the great vessels of the mediastinum and the coronary arteries, including calcified atherosclerotic plaque in the left main, left anterior descending and right coronary arteries. Right subclavian single-lumen porta cath with tip terminating in the distal superior vena cava. Mediastinum/Nodes: No pathologically enlarged mediastinal or hilar lymph nodes. Esophagus is unremarkable in appearance. No axillary lymphadenopathy. Lungs/Pleura: No suspicious pulmonary nodules or masses are noted. No acute consolidative airspace disease. No pleural effusions. Mild linear  scarring in the lingula. Musculoskeletal: Postoperative changes of lumpectomy are noted in the right breast. In the inferior aspect of the right breast there is a 4.1 x 2.8 cm low-attenuation area, which is presumably a post lumpectomy seroma. Surrounding architectural distortion and fat stranding. Skin thickening in the right breast. There are no aggressive appearing lytic or blastic lesions noted in the visualized portions of the skeleton. CT ABDOMEN PELVIS FINDINGS Hepatobiliary: No suspicious cystic or solid hepatic lesions. No intra or extrahepatic biliary ductal dilatation. Gallbladder is normal in appearance. Pancreas: No pancreatic mass. No pancreatic ductal dilatation. No pancreatic or peripancreatic fluid collections or inflammatory changes. Spleen: Unremarkable. Adrenals/Urinary Tract: Bilateral kidneys and adrenal glands are normal in appearance. No hydroureteronephrosis. Urinary bladder is normal in appearance. Stomach/Bowel: Normal appearance of the stomach. No pathologic dilatation of small bowel or colon. Suture line near the rectosigmoid junction, presumably from prior partial colectomy. No unexpected soft tissue mass at the level of the  suture line to suggest local recurrence of disease. The appendix is not confidently identified and may be surgically absent. Regardless, there are no inflammatory changes noted adjacent to the cecum to suggest the presence of an acute appendicitis at this time. Vascular/Lymphatic: Aortic atherosclerosis, without evidence of aneurysm or dissection in the abdominal or pelvic vasculature. No lymphadenopathy noted in the abdomen or pelvis. Reproductive: Status post hysterectomy. Ovaries are not confidently identified may be surgically absent or atrophic. Other: No significant volume of ascites.  No pneumoperitoneum. Musculoskeletal: There are no aggressive appearing lytic or blastic lesions noted in the visualized portions of the skeleton. IMPRESSION: 1. Status post  partial colectomy at the rectosigmoid junction, with no findings to suggest local recurrence of disease or definite metastatic disease in the chest, abdomen or pelvis. 2. New postoperative changes of lumpectomy in the right breast with a low-attenuation collection in the inferior aspect of the right breast which presumably reflects a postoperative seroma. Correlation with recent 01/22/2020 is recommended. Breast MRI 3. Aortic atherosclerosis, in addition to left main and 2 vessel coronary artery disease. Assessment for potential risk factor modification, dietary therapy or pharmacologic therapy may be warranted, if clinically indicated. 4. Additional incidental findings, as above. Electronically Signed   By: Vinnie Langton M.D.   On: 01/27/2020 09:36   MR Breast Bilateral W Wo Contrast  Result Date: 01/22/2020 CLINICAL DATA:  79 year old female status post right lumpectomy for invasive mammary carcinoma performed in February 2021. LABS:  None performed on site. EXAM: BILATERAL BREAST MRI WITH AND WITHOUT CONTRAST TECHNIQUE: Multiplanar, multisequence MR images of both breasts were obtained prior to and following the intravenous administration of 10 ml of Gadavist. Three-dimensional MR images were rendered by post-processing of the original MR data on an independent workstation. The three-dimensional MR images were interpreted, and findings are reported in the following complete MRI report for this study. Three dimensional images were evaluated at the independent Dyn[employer] workstation COMPARISON:  Previous exam(s). FINDINGS: Breast composition: b. Scattered fibroglandular tissue. Background parenchymal enhancement: Moderate. Right breast: A postoperative seroma is noted in the lower outer right breast extending from mid to far posterior depth. Diffuse non-mass enhancement is seen surrounding the lumpectomy bed. Additionally, there is skin thickening and enhancement involving the entire inferior breast. The  postsurgical changes abut the underlying pectoralis muscle, but no abnormal muscle enhancement is seen. No focally suspicious mass or enhancement is identified within the remainder of the right breast. Left breast: There is a 1 cm focus of linear enhancement in the central left breast at mid to posterior depth (series 10, image 51/128). No additional suspicious mass or enhancement identified. Lymph nodes: No abnormal appearing lymph nodes. Ancillary findings:  None. IMPRESSION: 1. Postoperative seroma with surrounding diffuse non-mass enhancement in the lower outer right breast extending from mid to far posterior depth. Additionally, there is skin thickening and enhancement involving the entire inferior right breast. These findings may be postsurgical/inflammatory in nature, but limit evaluation of subtle findings in the region. Lymphovascular spread of disease cannot be excluded in this setting. Otherwise, no focally suspicious mass is identified along the margins of the seroma or in the remainder of the right breast. 2. Indeterminate 1 cm focus of linear enhancement in the central left breast (series 10, image 51). This area is unlikely to be seen with ultrasound. Recommendation is to proceed directly with MRI guided biopsy. 3. No suspicious lymphadenopathy. RECOMMENDATION: 1. MRI guided biopsy of the left breast. 2. Per clinical treatment plan on the  right. BI-RADS CATEGORY  4: Suspicious. Electronically Signed   By: Kristopher Oppenheim M.D.   On: 01/22/2020 13:35   NM SENTINEL NODE INJECTION  Result Date: 01/03/2020 CLINICAL DATA:  Right breast cancer. EXAM: NUCLEAR MEDICINE BREAST LYMPHOSCINTIGRAPHY RIGHT BREAST TECHNIQUE: Intradermal injection of radiopharmaceutical was performed at the 12 o'clock, 3 o'clock, 6 o'clock, and 9 o'clock positions around the right nipple. The patient was then sent to the operating room where the sentinel node(s) were identified and removed by the surgeon. RADIOPHARMACEUTICALS:   Total of 0.7 mCi Millipore-filtered Technetium-63msulfur colloid, injected in four aliquots of 0.25 mCi each. IMPRESSION: Uncomplicated intradermal injection of a total of 0.7 mCi Technetium-964mulfur colloid for purposes of sentinel node identification. Electronically Signed   By: ThMarcello MooresRegister   On: 01/03/2020 09:37   MM Breast Surgical Specimen  Result Date: 01/03/2020 CLINICAL DATA:  Status post wire localized RIGHT lumpectomy. EXAM: SPECIMEN RADIOGRAPH OF THE RIGHT BREAST COMPARISON:  Previous exam(s). FINDINGS: Status post excision of the right breast. The wire tip and coil shaped biopsy marker clip are present and are marked for pathology. Findings are called to the operating room at the time of interpretation. IMPRESSION: Specimen radiograph of the right breast. Electronically Signed   By: ElNolon Nations.D.   On: 01/03/2020 14:54   [place]AST LTD UNI RIGHT INC AXILLA  Result Date: 11/28/2019 CLINICAL DATA:  Patient recalled from screening for right breast mass. EXAM: DIGITAL DIAGNOSTIC RIGHT MAMMOGRAM WITH CAD AND TOMO ULTRASOUND RIGHT BREAST COMPARISON:  Previous exam(s). ACR Breast Density Category c: The breast tissue is heterogeneously dense, which may obscure small masses. FINDINGS: Within the posterior lower outer right breast there is a persistent high density lobular mass further evaluated with spot compression CC and MLO tomosynthesis images. Mammographic images were processed with CAD. On physical exam, no discrete mass is palpated within the lower outer right breast. Targeted ultrasound is performed, showing a 6 x 5 x 7 mm lobular hypoechoic mass right breast 7 o'clock position 3 cm from nipple felt to correspond with mammographic abnormality. No right axillary adenopathy. IMPRESSION: Indeterminate right breast mass 7 o'clock position. RECOMMENDATION: Ultrasound guided core needle biopsy indeterminate right breast mass 7 o'clock position. Recommend correlation with post biopsy  mammogram images to ensure this corresponds with the mammographically identified mass given the deep location. I have discussed the findings and recommendations with the patient. If applicable, a reminder letter will be sent to the patient regarding the next appointment. BI-RADS CATEGORY  4: Suspicious. Electronically Signed   By: DrLovey Newcomer.D.   On: 11/28/2019 10:36   MM DIAG BREAST TOMO UNI RIGHT  Result Date: 01/03/2020 CLINICAL DATA:  Status post ultrasound-guided localization of mass in the 7 o'clock location of the RIGHT breast. EXAM: DIAGNOSTIC RIGHT MAMMOGRAM POST ULTRASOUND-GUIDED WIRE LOCALIZATION COMPARISON:  Previous exam(s). FINDINGS: Mammographic images were obtained following ultrasound-guided wire localization. These demonstrate the wire within the lesion marked with a coil shaped clip in the posterior central portion of the RIGHT breast. IMPRESSION: Appropriate location of the localizing wire. Final Assessment: Post Procedure Mammograms for Seed Placement Electronically Signed   By: ElNolon Nations.D.   On: 01/03/2020 09:55   MM DIAG BREAST TOMO UNI RIGHT  Result Date: 11/28/2019 CLINICAL DATA:  Patient recalled from screening for right breast mass. EXAM: DIGITAL DIAGNOSTIC RIGHT MAMMOGRAM WITH CAD AND TOMO ULTRASOUND RIGHT BREAST COMPARISON:  Previous exam(s). ACR Breast Density Category c: The breast tissue is heterogeneously dense, which may  obscure small masses. FINDINGS: Within the posterior lower outer right breast there is a persistent high density lobular mass further evaluated with spot compression CC and MLO tomosynthesis images. Mammographic images were processed with CAD. On physical exam, no discrete mass is palpated within the lower outer right breast. Targeted ultrasound is performed, showing a 6 x 5 x 7 mm lobular hypoechoic mass right breast 7 o'clock position 3 cm from nipple felt to correspond with mammographic abnormality. No right axillary adenopathy. IMPRESSION:  Indeterminate right breast mass 7 o'clock position. RECOMMENDATION: Ultrasound guided core needle biopsy indeterminate right breast mass 7 o'clock position. Recommend correlation with post biopsy mammogram images to ensure this corresponds with the mammographically identified mass given the deep location. I have discussed the findings and recommendations with the patient. If applicable, a reminder letter will be sent to the patient regarding the next appointment. BI-RADS CATEGORY  4: Suspicious. Electronically Signed   By: Lovey Newcomer M.D.   On: 11/28/2019 10:36   MM 3D SCREEN BREAST BILATERAL  Result Date: 11/19/2019 CLINICAL DATA:  Screening. EXAM: DIGITAL SCREENING BILATERAL MAMMOGRAM WITH TOMO AND CAD COMPARISON:  Previous exam(s). ACR Breast Density Category b: There are scattered areas of fibroglandular density. FINDINGS: In the right breast, a possible mass warrants further evaluation. In the left breast, no findings suspicious for malignancy. Images were processed with CAD. IMPRESSION: Further evaluation is suggested for possible mass in the right breast. RECOMMENDATION: Diagnostic mammogram and possibly ultrasound of the right breast. (Code:[id number]) The patient will be contacted regarding the findings, and additional imaging will be scheduled. BI-RADS CATEGORY  0: Incomplete. Need additional imaging evaluation and/or prior mammograms for comparison. Electronically Signed   By: Margarette Canada M.D.   On: 11/19/2019 14:52   MM CLIP PLACEMENT RIGHT  Result Date: 12/04/2019 CLINICAL DATA:  Status post ultrasound-guided core biopsy of a right breast mass. EXAM: DIAGNOSTIC RIGHT MAMMOGRAM POST ULTRASOUND BIOPSY COMPARISON:  Previous exam(s). FINDINGS: Mammographic images were obtained following ultrasound guided biopsy of a mass in the 7 o'clock region of the right breast. The biopsy marking clip is in the expected location at the posterior margin of the mass in the 7 o'clock region of the breast. IMPRESSION:  Appropriate positioning of the coil shaped biopsy marking clip at the site of biopsy in the 7 o'clock region of the right breast. Final Assessment: Post Procedure Mammograms for Marker Placement Electronically Signed   By: Lillia Mountain M.D.   On: 12/04/2019 09:34   [place] RT BREAST BX W LOC DEV 1ST LESION IMG BX SPEC US GUIDE  Addendum Date: 12/09/2019   ADDENDUM REPORT: 12/09/2019 10:39 ADDENDUM: PATHOLOGY revealed: A. BREAST, RIGHT AT 7:00, 3 CM FROM THE NIPPLE; ULTRASOUND-GUIDED CORE BIOPSY: - INVASIVE MAMMARY CARCINOMA, NO SPECIAL TYPE. 7 mm in this sample. Grade 3. Ductal carcinoma in situ: Present, intermediate grade. Lymphovascular invasion: Not identified. Pathology results are CONCORDANT with imaging findings, per Dr. Lillia Mountain. Pathology results and recommendations below were discussed with patient by telephone on 12/09/2019. Patient reported biopsy site doing well with slight tenderness at the site. Post biopsy care instructions were reviewed and questions were answered. Patient was instructed to call [facility] if any concerns or questions arise related to the biopsy. Recommendation: Surgical referral. Request for surgical referral was relayed to nurse navigators at [facility] by Electa Sniff RN on 12/09/2019. Addendum by Electa Sniff RN on 12/09/2019. Electronically Signed   By: Lillia Mountain M.D.   On: 12/09/2019 10:39  Result Date: 12/09/2019 CLINICAL DATA:  Right breast mass. EXAM: ULTRASOUND GUIDED RIGHT BREAST CORE NEEDLE BIOPSY COMPARISON:  Previous exam(s). FINDINGS: I met with the patient and we discussed the procedure of ultrasound-guided biopsy, including benefits and alternatives. We discussed the high likelihood of a successful procedure. We discussed the risks of the procedure, including infection, bleeding, tissue injury, clip migration, and inadequate sampling. Informed written consent was given. The usual time-out protocol was performed immediately prior to the  procedure. Lesion quadrant: Lower outer quadrant Using sterile technique and 1% Lidocaine as local anesthetic, under direct ultrasound visualization, a 14 gauge spring-loaded device was used to perform biopsy of a mass in the 7 o'clock region of the right breast using a lateral to medial approach. At the conclusion of the procedure coil shaped tissue marker clip was deployed into the biopsy cavity. Follow up 2 view mammogram was performed and dictated separately. IMPRESSION: Ultrasound guided biopsy of the right breast. No apparent complications. Electronically Signed: By: Lillia Mountain M.D. On: 12/04/2019 09:19   [place] RT PLC BREAST LOC DEV   1ST LESION  INC US GUIDE  Result Date: 01/03/2020 CLINICAL DATA:  Patient presents for needle localization prior to lumpectomy for invasive mammary carcinoma of the RIGHT breast. EXAM: NEEDLE LOCALIZATION OF THE RIGHT BREAST WITH ULTRASOUND GUIDANCE COMPARISON:  Previous exams. FINDINGS: Patient presents for needle localization prior to lumpectomy. I met with the patient and we discussed the procedure of needle localization including benefits and alternatives. We discussed the high likelihood of a successful procedure. We discussed the risks of the procedure, including infection, bleeding, tissue injury, and further surgery. Informed, written consent was given. The usual time-out protocol was performed immediately prior to the procedure. Using ultrasound guidance, sterile technique, 1% lidocaine and a 7 centimeter modified Kopans needle, the mass in the 7 o'clock location of the RIGHT breast localized using LATERAL to MEDIAL approach. The images were marked for Dr. Celine Ahr. IMPRESSION: Needle localization of the RIGHT breast. No apparent complications. Electronically Signed   By: Nolon Nations M.D.   On: 01/03/2020 09:38     ASSESSMENT & PLAN:  79 y.o.female who has Stage IIIB colon cancer.,  History of DVT, presented for evaluation for newly diagnosed breast  cancer. Cancer Staging Cancer of descending colon [facility]) Staging form: Colon and Rectum, AJCC 8th Edition - Pathologic: Stage IIIB (pT4a, pN1, cM0) - Signed by Earlie Server, MD on 04/30/2018  Malignant neoplasm of right breast in female, estrogen receptor positive ([place]) Staging form: Breast, AJCC 8th Edition - Clinical: No stage assigned - Unsigned - Pathologic: Stage IA (pT1c, pN0, cM0, G3, ER+, PR+, HER2-, Oncotype DX score: 8) - Signed by Earlie Server, MD on 01/30/2020  1. History of colon cancer   2. Malignant neoplasm of right breast in female, estrogen receptor positive, unspecified site of breast ([place])   3. 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 Port-A-Cath in place     #Stage IA, right Breast cancer, pT1cN0 ER/PR positive, HER-2 negative. Margin is positive. MRI breast also showed indeterminate left breast, plan MRI guided biopsy.  She will also have right mastectomy.  Low OncotypeDx score, I will not offer adjuvant chemotherapy.  Since she will get mastectomy, assuming margin is negative, she will not need radiation.  I discussed about adjuvant endocrine therapy. Will discuss further after she finishes her surgery.    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 Patient  may discontinue Xarelto 3 days prior to the lumpectomy procedure.  She may resume 2 days after surgery.  # History of colon cancer - diagnosed 06/07/2017, finished all treatment April 2019.  Surveillance CT scan every 6 months for the first 2 years.   CT chest abdomen pelvis, images were reviewed and discussed. No recurrence disease. CEA is stable.  Labs are reviewed and discussed with patient. #Aortic atherosclerosis, recommend patient to continue follow up with PCP for further management.  Slightly elevated creatine, encourage patient to increase oral hydration.   #Port-A-Cath in place, patient desires to have port removed if  she remains in remission after 2 years. Continue port flush every 6 to 8 weeks.  No orders of the defined types were placed in this encounter.   Return of visit: 2 weeks after her surgery.    Earlie Server, MD, PhD Hematology Oncology [facility] at [facility] Pager- [phone] 01/30/2020

## 2020-01-31 ENCOUNTER — Ambulatory Visit
Admission: RE | Admit: 2020-01-31 | Discharge: 2020-01-31 | Disposition: A | Discharge status: Home / Self Care | Payer: PPO | Source: Ambulatory Visit | Attending: General Surgery | Admitting: General Surgery

## 2020-01-31 ENCOUNTER — Other Ambulatory Visit: Payer: Self-pay | Admitting: Neurology

## 2020-01-31 ENCOUNTER — Other Ambulatory Visit: Payer: Self-pay

## 2020-01-31 DIAGNOSIS — N6012 Diffuse cystic mastopathy of left breast: Secondary | ICD-10-CM | POA: Diagnosis not present

## 2020-01-31 DIAGNOSIS — R9389 Abnormal findings on diagnostic imaging of other specified body structures: Secondary | ICD-10-CM

## 2020-01-31 DIAGNOSIS — R928 Other abnormal and inconclusive findings on diagnostic imaging of breast: Secondary | ICD-10-CM | POA: Diagnosis not present

## 2020-01-31 HISTORY — PX: BREAST BIOPSY: SHX20

## 2020-01-31 MED ORDER — GADOBUTROL 1 MMOL/ML IV SOLN
10.0000 mL | Freq: Once | INTRAVENOUS | Status: AC | PRN
  Administered 2020-01-31: 10 mL via INTRAVENOUS

## 2020-02-05 ENCOUNTER — Telehealth: Payer: Self-pay

## 2020-02-05 DIAGNOSIS — G250 Essential tremor: Secondary | ICD-10-CM | POA: Insufficient documentation

## 2020-02-05 NOTE — Telephone Encounter (Signed)
Patient would like to speak with Dr.Cannon regarding breast biopsy results. I let her know I would send a message to [facility].

## 2020-02-06 NOTE — Telephone Encounter (Signed)
Patient called again in an attempt to review & discuss her recent biopsy results.  She is aware of the pending message from earlier in the week & that a reminder will be added asking for a call back.  Please discuss by returning her call @ [phone].  Thank you

## 2020-02-06 NOTE — Telephone Encounter (Signed)
Spoke w/pt and discussed pathology. Will schedule surgery for right breast (simple mastectomy).

## 2020-02-07 ENCOUNTER — Encounter: Payer: Self-pay | Admitting: Family

## 2020-02-10 ENCOUNTER — Telehealth: Payer: Self-pay | Admitting: General Surgery

## 2020-02-10 NOTE — Telephone Encounter (Signed)
Pt has been advised of Pre-Admission date/time, COVID Testing date and Surgery date.  Surgery Date: 02/14/20 Preadmission Testing Date: 02/11/20 (phone 1p-5p) Covid Testing Date: 02/12/20 - patient advised to go to the [place] ([place]) between 8a-1p  Patient has been made aware to call [phone], between 1-3:00pm the day before surgery, to find out what time to arrive for surgery.

## 2020-02-11 ENCOUNTER — Other Ambulatory Visit
Admission: RE | Admit: 2020-02-11 | Discharge: 2020-02-11 | Disposition: A | Discharge status: Home / Self Care | Payer: PPO | Source: Ambulatory Visit | Attending: General Surgery | Admitting: General Surgery

## 2020-02-11 ENCOUNTER — Other Ambulatory Visit: Payer: Self-pay

## 2020-02-11 ENCOUNTER — Other Ambulatory Visit: Payer: Self-pay | Admitting: General Surgery

## 2020-02-11 DIAGNOSIS — C50911 Malignant neoplasm of unspecified site of right female breast: Secondary | ICD-10-CM

## 2020-02-11 NOTE — Patient Instructions (Signed)
Your COVID test is scheduled on: Wednesday 02/12/2020. Drive up in front of the [facility] any time between 8am and 1pm.  Your procedure is scheduled on: Friday 02/14/2020 Enter through the [facility], then take elevator to 2nd floor.  Check in with surgery information desk. To find out your arrival time, call [phone] 1:00-3:00 PM on Thursday 02/13/2020  Remember: Instructions that are not followed completely may result in serious medical risk, up to and including death, or upon the discretion of your surgeon and anesthesiologist your surgery may need to be rescheduled.   __x__ 1. Do not eat food (including mints, candies, chewing gum) after midnight the night before your procedure. You may drink water up to 2 hours before you are scheduled to arrive at the hospital for your procedure.  Do not drink anything within 2 hours of your scheduled arrival to the hospital.   __x__ 2. No alcohol or smoking for 24 hours before or after surgery.  __x__ 3. Notify your doctor if there is any change in your medical condition (cold, fever, infections).  __x__ 4. On the morning of surgery brush your teeth with toothpaste and water.  You may rinse your mouth with mouthwash if you wish.  Do not swallow any toothpaste or mouthwash.  Please read over the following fact sheets that you were given: [facility] Preparing for Surgery  __x__ Use CHG Soap as directed on instruction sheet.  Do not wear jewelry, make-up, hairpins, clips or nail polish on the day of surgery. Do not wear lotions, powders, deodorant, or perfumes.  Do not shave below the face/neck 48 hours prior to surgery.  Do not bring valuables to the hospital.  [facility] is not responsible for any belongings or valuables.   Eyeglasses and hearing aids may not be worn into surgery. If possible, bring a plastic protective case for your glasses and hearing aids. For patients admitted to the hospital, discharge time is  determined by your treatment team.  __x__ Take these medicines on the morning of surgery with a SMALL SIP OF WATER:  1. Topiramate (Topamax)  _n/a_ Follow recommendations from Cardiologist, Pulmonologist or PCP regarding stopping Aspirin, Coumadin, Plavix, Eliquis, Effient, Pradaxa, and Pletal.  __x__ STARTING TODAY: Do not take any anti-inflammatories such as Advil, Ibuprofen, Motrin, Aleve, Naproxen, Naprosyn, BC/Goodies powders or aspirin products. You may take Tylenol if needed.   __x__ STARTING TODAY: Do not take any new over the counter vitamins or supplements until after surgery. You may continue to take your multivitamin.

## 2020-02-12 ENCOUNTER — Other Ambulatory Visit
Admission: RE | Admit: 2020-02-12 | Discharge: 2020-02-12 | Disposition: A | Discharge status: Home / Self Care | Payer: PPO | Source: Ambulatory Visit | Attending: General Surgery | Admitting: General Surgery

## 2020-02-12 ENCOUNTER — Other Ambulatory Visit: Admission: RE | Admit: 2020-02-12 | Payer: PPO | Source: Ambulatory Visit

## 2020-02-12 DIAGNOSIS — Z20822 Contact with and (suspected) exposure to covid-19: Secondary | ICD-10-CM | POA: Diagnosis not present

## 2020-02-12 DIAGNOSIS — I10 Essential (primary) hypertension: Secondary | ICD-10-CM | POA: Diagnosis not present

## 2020-02-12 DIAGNOSIS — Z01818 Encounter for other preprocedural examination: Secondary | ICD-10-CM | POA: Insufficient documentation

## 2020-02-12 LAB — SARS CORONAVIRUS 2 (TAT 6-24 HRS): SARS Coronavirus 2: NEGATIVE

## 2020-02-13 ENCOUNTER — Telehealth: Payer: Self-pay

## 2020-02-13 NOTE — Telephone Encounter (Signed)
Pt will have mastectomy on 4/2. Please schedule pt for MD visit 2 weeks after surgery. Notify pt of appt please. Thanks.

## 2020-02-14 ENCOUNTER — Observation Stay
Admission: RE | Admit: 2020-02-14 | Discharge: 2020-02-15 | Disposition: A | Discharge status: Home / Self Care | Payer: PPO | Attending: General Surgery | Admitting: General Surgery

## 2020-02-14 ENCOUNTER — Other Ambulatory Visit: Payer: Self-pay

## 2020-02-14 ENCOUNTER — Encounter: Payer: Self-pay | Admitting: General Surgery

## 2020-02-14 ENCOUNTER — Ambulatory Visit: Payer: PPO | Admitting: Family

## 2020-02-14 ENCOUNTER — Encounter
Admission: RE | Disposition: A | Discharge status: Home / Self Care | Payer: Self-pay | Source: Home / Self Care | Attending: General Surgery

## 2020-02-14 DIAGNOSIS — Z9842 Cataract extraction status, left eye: Secondary | ICD-10-CM | POA: Insufficient documentation

## 2020-02-14 DIAGNOSIS — Z79899 Other long term (current) drug therapy: Secondary | ICD-10-CM | POA: Diagnosis not present

## 2020-02-14 DIAGNOSIS — Z9221 Personal history of antineoplastic chemotherapy: Secondary | ICD-10-CM | POA: Insufficient documentation

## 2020-02-14 DIAGNOSIS — Z885 Allergy status to narcotic agent status: Secondary | ICD-10-CM | POA: Diagnosis not present

## 2020-02-14 DIAGNOSIS — Z8542 Personal history of malignant neoplasm of other parts of uterus: Secondary | ICD-10-CM | POA: Insufficient documentation

## 2020-02-14 DIAGNOSIS — Z85038 Personal history of other malignant neoplasm of large intestine: Secondary | ICD-10-CM

## 2020-02-14 DIAGNOSIS — Z9011 Acquired absence of right breast and nipple: Secondary | ICD-10-CM

## 2020-02-14 DIAGNOSIS — Z452 Encounter for adjustment and management of vascular access device: Principal | ICD-10-CM | POA: Insufficient documentation

## 2020-02-14 DIAGNOSIS — K589 Irritable bowel syndrome without diarrhea: Secondary | ICD-10-CM | POA: Insufficient documentation

## 2020-02-14 DIAGNOSIS — E785 Hyperlipidemia, unspecified: Secondary | ICD-10-CM | POA: Diagnosis not present

## 2020-02-14 DIAGNOSIS — K219 Gastro-esophageal reflux disease without esophagitis: Secondary | ICD-10-CM | POA: Diagnosis not present

## 2020-02-14 DIAGNOSIS — Z961 Presence of intraocular lens: Secondary | ICD-10-CM | POA: Insufficient documentation

## 2020-02-14 DIAGNOSIS — Z170 Estrogen receptor positive status [ER+]: Secondary | ICD-10-CM | POA: Diagnosis not present

## 2020-02-14 DIAGNOSIS — C50911 Malignant neoplasm of unspecified site of right female breast: Secondary | ICD-10-CM | POA: Diagnosis not present

## 2020-02-14 DIAGNOSIS — M1990 Unspecified osteoarthritis, unspecified site: Secondary | ICD-10-CM | POA: Diagnosis not present

## 2020-02-14 DIAGNOSIS — Z90710 Acquired absence of both cervix and uterus: Secondary | ICD-10-CM | POA: Insufficient documentation

## 2020-02-14 DIAGNOSIS — I10 Essential (primary) hypertension: Secondary | ICD-10-CM | POA: Insufficient documentation

## 2020-02-14 DIAGNOSIS — Z9841 Cataract extraction status, right eye: Secondary | ICD-10-CM | POA: Diagnosis not present

## 2020-02-14 DIAGNOSIS — H3530 Unspecified macular degeneration: Secondary | ICD-10-CM | POA: Diagnosis not present

## 2020-02-14 DIAGNOSIS — R0683 Snoring: Secondary | ICD-10-CM | POA: Insufficient documentation

## 2020-02-14 HISTORY — PX: MASTECTOMY: SHX3

## 2020-02-14 HISTORY — PX: SIMPLE MASTECTOMY WITH AXILLARY SENTINEL NODE BIOPSY: SHX6098

## 2020-02-14 HISTORY — PX: PORT-A-CATH REMOVAL: SHX5289

## 2020-02-14 LAB — CBC
HCT: 40.8 % (ref 36.0–46.0)
Hemoglobin: 12.6 g/dL (ref 12.0–15.0)
MCH: 28.8 pg (ref 26.0–34.0)
MCHC: 30.9 g/dL (ref 30.0–36.0)
MCV: 93.4 fL (ref 80.0–100.0)
Platelets: 208 10*3/uL (ref 150–400)
RBC: 4.37 MIL/uL (ref 3.87–5.11)
RDW: 14.2 % (ref 11.5–15.5)
WBC: 6.3 10*3/uL (ref 4.0–10.5)
nRBC: 0 % (ref 0.0–0.2)

## 2020-02-14 LAB — CREATININE, SERUM
Creatinine, Ser: 1.04 mg/dL — ABNORMAL HIGH (ref 0.44–1.00)
GFR calc Af Amer: 60 mL/min — ABNORMAL LOW (ref >60)
GFR calc non Af Amer: 51 mL/min — ABNORMAL LOW (ref >60)

## 2020-02-14 SURGERY — SIMPLE MASTECTOMY
Anesthesia: General | Site: Chest | Laterality: Right

## 2020-02-14 MED ORDER — ONDANSETRON HCL 4 MG/2ML IJ SOLN: 4.0000 mg | Freq: Once | INTRAMUSCULAR | Status: DC | PRN

## 2020-02-14 MED ORDER — DEXAMETHASONE SODIUM PHOSPHATE 10 MG/ML IJ SOLN
INTRAMUSCULAR | Status: DC | PRN
  Administered 2020-02-14: 10 mg via INTRAVENOUS

## 2020-02-14 MED ORDER — FAMOTIDINE 20 MG PO TABS: 20.0000 mg | ORAL_TABLET | Freq: Once | ORAL | Status: AC

## 2020-02-14 MED ORDER — METHOCARBAMOL 500 MG PO TABS: 500.0000 mg | ORAL_TABLET | Freq: Four times a day (QID) | ORAL | Status: DC | PRN

## 2020-02-14 MED ORDER — ROCURONIUM BROMIDE 100 MG/10ML IV SOLN
INTRAVENOUS | Status: DC | PRN
  Administered 2020-02-14: 30 mg via INTRAVENOUS
  Administered 2020-02-14: 50 mg via INTRAVENOUS

## 2020-02-14 MED ORDER — TOPIRAMATE 100 MG PO TABS
100.0000 mg | ORAL_TABLET | Freq: Two times a day (BID) | ORAL | Status: DC
  Administered 2020-02-15: 100 mg via ORAL
  Filled 2020-02-14 (×2): qty 1

## 2020-02-14 MED ORDER — KETAMINE HCL 100 MG/ML IJ SOLN
INTRAMUSCULAR | Status: DC | PRN
  Administered 2020-02-14: 50 mg via INTRAVENOUS
  Administered 2020-02-14 (×2): 25 mg via INTRAVENOUS

## 2020-02-14 MED ORDER — CHLORHEXIDINE GLUCONATE CLOTH 2 % EX PADS
6.0000 | MEDICATED_PAD | Freq: Once | CUTANEOUS | Status: AC
  Administered 2020-02-14: 6 via TOPICAL

## 2020-02-14 MED ORDER — CEFAZOLIN SODIUM-DEXTROSE 2-4 GM/100ML-% IV SOLN
2.0000 g | INTRAVENOUS | Status: AC
  Administered 2020-02-14: 2 g via INTRAVENOUS

## 2020-02-14 MED ORDER — IBUPROFEN 400 MG PO TABS: 600.0000 mg | ORAL_TABLET | Freq: Four times a day (QID) | ORAL | Status: DC | PRN

## 2020-02-14 MED ORDER — SUGAMMADEX SODIUM 200 MG/2ML IV SOLN
INTRAVENOUS | Status: DC | PRN
  Administered 2020-02-14: 100 mg via INTRAVENOUS

## 2020-02-14 MED ORDER — ACETAMINOPHEN 500 MG PO TABS
ORAL_TABLET | ORAL | Status: AC
  Administered 2020-02-14: 09:00:00 1000 mg via ORAL
  Filled 2020-02-14: qty 2

## 2020-02-14 MED ORDER — FLUTICASONE PROPIONATE 50 MCG/ACT NA SUSP
2.0000 | Freq: Every day | NASAL | Status: DC
  Filled 2020-02-14: qty 16

## 2020-02-14 MED ORDER — PROPOFOL 500 MG/50ML IV EMUL
INTRAVENOUS | Status: DC | PRN
  Administered 2020-02-14: 160 mg via INTRAVENOUS
  Administered 2020-02-14: 100 ug/kg/min via INTRAVENOUS

## 2020-02-14 MED ORDER — TOPIRAMATE 25 MG PO TABS
25.0000 mg | ORAL_TABLET | Freq: Two times a day (BID) | ORAL | Status: DC
  Administered 2020-02-14: 25 mg via ORAL
  Filled 2020-02-14: qty 1

## 2020-02-14 MED ORDER — PROPOFOL 500 MG/50ML IV EMUL
INTRAVENOUS | Status: AC
  Filled 2020-02-14: qty 50

## 2020-02-14 MED ORDER — FENTANYL CITRATE (PF) 100 MCG/2ML IJ SOLN
INTRAMUSCULAR | Status: AC
  Filled 2020-02-14: qty 2

## 2020-02-14 MED ORDER — CELECOXIB 200 MG PO CAPS
200.0000 mg | ORAL_CAPSULE | Freq: Two times a day (BID) | ORAL | Status: DC
  Filled 2020-02-14 (×3): qty 1

## 2020-02-14 MED ORDER — KETAMINE HCL 50 MG/ML IJ SOLN
INTRAMUSCULAR | Status: AC
  Filled 2020-02-14: qty 10

## 2020-02-14 MED ORDER — GABAPENTIN 300 MG PO CAPS: 300.0000 mg | ORAL_CAPSULE | ORAL | Status: AC

## 2020-02-14 MED ORDER — GABAPENTIN 300 MG PO CAPS
ORAL_CAPSULE | ORAL | Status: AC
  Administered 2020-02-14: 09:00:00 300 mg via ORAL
  Filled 2020-02-14: qty 1

## 2020-02-14 MED ORDER — TOPIRAMATE 25 MG PO TABS
50.0000 mg | ORAL_TABLET | Freq: Two times a day (BID) | ORAL | Status: DC
  Administered 2020-02-14: 50 mg via ORAL
  Filled 2020-02-14: qty 2

## 2020-02-14 MED ORDER — ISOSULFAN BLUE 1 % ~~LOC~~ SOLN
SUBCUTANEOUS | Status: AC
  Filled 2020-02-14: qty 5

## 2020-02-14 MED ORDER — CELECOXIB 200 MG PO CAPS
ORAL_CAPSULE | ORAL | Status: AC
  Administered 2020-02-14: 200 mg via ORAL
  Filled 2020-02-14: qty 1

## 2020-02-14 MED ORDER — ONDANSETRON HCL 4 MG/2ML IJ SOLN: 4.0000 mg | Freq: Four times a day (QID) | INTRAMUSCULAR | Status: DC | PRN

## 2020-02-14 MED ORDER — METOPROLOL SUCCINATE ER 100 MG PO TB24
100.0000 mg | ORAL_TABLET | Freq: Every evening | ORAL | Status: DC
  Administered 2020-02-14: 100 mg via ORAL
  Filled 2020-02-14: qty 1

## 2020-02-14 MED ORDER — TRAMADOL HCL 50 MG PO TABS: 50.0000 mg | ORAL_TABLET | Freq: Four times a day (QID) | ORAL | Status: DC | PRN

## 2020-02-14 MED ORDER — FENTANYL CITRATE (PF) 100 MCG/2ML IJ SOLN: 25.0000 ug | INTRAMUSCULAR | Status: DC | PRN

## 2020-02-14 MED ORDER — LOSARTAN POTASSIUM 50 MG PO TABS
50.0000 mg | ORAL_TABLET | ORAL | Status: DC
  Administered 2020-02-15: 50 mg via ORAL
  Filled 2020-02-14: qty 1

## 2020-02-14 MED ORDER — LIDOCAINE-EPINEPHRINE 1 %-1:100000 IJ SOLN
INTRAMUSCULAR | Status: AC
  Filled 2020-02-14: qty 1

## 2020-02-14 MED ORDER — ACETAMINOPHEN 500 MG PO TABS: 1000.0000 mg | ORAL_TABLET | ORAL | Status: AC

## 2020-02-14 MED ORDER — LIDOCAINE HCL (CARDIAC) PF 100 MG/5ML IV SOSY
PREFILLED_SYRINGE | INTRAVENOUS | Status: DC | PRN
  Administered 2020-02-14: 60 mg via INTRAVENOUS

## 2020-02-14 MED ORDER — CEFAZOLIN SODIUM-DEXTROSE 2-4 GM/100ML-% IV SOLN
INTRAVENOUS | Status: AC
  Filled 2020-02-14: qty 100

## 2020-02-14 MED ORDER — FAMOTIDINE 20 MG PO TABS
ORAL_TABLET | ORAL | Status: AC
  Administered 2020-02-14: 09:00:00 20 mg via ORAL
  Filled 2020-02-14: qty 1

## 2020-02-14 MED ORDER — LACTATED RINGERS IV SOLN: INTRAVENOUS | Status: DC

## 2020-02-14 MED ORDER — LIDOCAINE HCL (PF) 2 % IJ SOLN
INTRAMUSCULAR | Status: AC
  Filled 2020-02-14: qty 10

## 2020-02-14 MED ORDER — CELECOXIB 200 MG PO CAPS: 200.0000 mg | ORAL_CAPSULE | ORAL | Status: AC

## 2020-02-14 MED ORDER — HYDROMORPHONE HCL 1 MG/ML IJ SOLN: 0.5000 mg | INTRAMUSCULAR | Status: DC | PRN

## 2020-02-14 MED ORDER — BUPIVACAINE HCL (PF) 0.25 % IJ SOLN
INTRAMUSCULAR | Status: AC
  Filled 2020-02-14: qty 30

## 2020-02-14 MED ORDER — BUPIVACAINE HCL 0.25 % IJ SOLN
INTRAMUSCULAR | Status: DC | PRN
  Administered 2020-02-14: 9 mL

## 2020-02-14 MED ORDER — ACETAMINOPHEN 500 MG PO TABS
1000.0000 mg | ORAL_TABLET | Freq: Four times a day (QID) | ORAL | Status: DC
  Filled 2020-02-14 (×2): qty 2

## 2020-02-14 MED ORDER — ENOXAPARIN SODIUM 40 MG/0.4ML ~~LOC~~ SOLN
40.0000 mg | SUBCUTANEOUS | Status: DC
  Filled 2020-02-14: qty 0.4

## 2020-02-14 MED ORDER — LIDOCAINE-EPINEPHRINE 1 %-1:100000 IJ SOLN
INTRAMUSCULAR | Status: DC | PRN
  Administered 2020-02-14: 9 mL

## 2020-02-14 MED ORDER — ONDANSETRON 4 MG PO TBDP: 4.0000 mg | ORAL_TABLET | Freq: Four times a day (QID) | ORAL | Status: DC | PRN

## 2020-02-14 MED ORDER — ONDANSETRON HCL 4 MG/2ML IJ SOLN
INTRAMUSCULAR | Status: DC | PRN
  Administered 2020-02-14: 4 mg via INTRAVENOUS

## 2020-02-14 MED ORDER — BUPIVACAINE LIPOSOME 1.3 % IJ SUSP: 20.0000 mL | Freq: Once | INTRAMUSCULAR | Status: DC

## 2020-02-14 MED ORDER — PROPOFOL 10 MG/ML IV BOLUS
INTRAVENOUS | Status: AC
  Filled 2020-02-14: qty 20

## 2020-02-14 SURGICAL SUPPLY — 53 items
APPLIER CLIP 9.375 SM OPEN (CLIP)
BLADE SURG 15 STRL LF DISP TIS (BLADE) ×2 IMPLANT
BLADE SURG 15 STRL SS (BLADE) ×1
BULB RESERV EVAC DRAIN JP 100C (MISCELLANEOUS) ×3 IMPLANT
CANISTER SUCT 1200ML W/VALVE (MISCELLANEOUS) ×3 IMPLANT
CHLORAPREP W/TINT 26 (MISCELLANEOUS) ×3 IMPLANT
CLIP APPLIE 9.375 SM OPEN (CLIP) IMPLANT
CNTNR SPEC 2.5X3XGRAD LEK (MISCELLANEOUS)
CONT SPEC 4OZ STER OR WHT (MISCELLANEOUS)
CONTAINER SPEC 2.5X3XGRAD LEK (MISCELLANEOUS) IMPLANT
COVER WAND RF STERILE (DRAPES) ×3 IMPLANT
DERMABOND ADVANCED (GAUZE/BANDAGES/DRESSINGS) ×2
DERMABOND ADVANCED .7 DNX12 (GAUZE/BANDAGES/DRESSINGS) ×4 IMPLANT
DEVICE DSSCT PLSMBLD 3.0S LGHT (MISCELLANEOUS) ×2 IMPLANT
DRAIN CHANNEL JP 15F RND 16 (MISCELLANEOUS) ×3 IMPLANT
DRAPE LAPAROTOMY TRNSV 106X77 (MISCELLANEOUS) ×3 IMPLANT
ELECT CAUTERY BLADE TIP 2.5 (TIP) ×3
ELECT REM PT RETURN 9FT ADLT (ELECTROSURGICAL) ×3
ELECTRODE CAUTERY BLDE TIP 2.5 (TIP) ×2 IMPLANT
ELECTRODE REM PT RTRN 9FT ADLT (ELECTROSURGICAL) ×2 IMPLANT
GLOVE BIO SURGEON STRL SZ 6.5 (GLOVE) ×3 IMPLANT
GLOVE INDICATOR 7.0 STRL GRN (GLOVE) ×3 IMPLANT
GOWN STRL REUS W/ TWL LRG LVL3 (GOWN DISPOSABLE) ×6 IMPLANT
GOWN STRL REUS W/TWL LRG LVL3 (GOWN DISPOSABLE) ×3
KIT MARKER MARGIN INK (KITS) ×3 IMPLANT
KIT TURNOVER KIT A (KITS) ×3 IMPLANT
LABEL OR SOLS (LABEL) ×3 IMPLANT
NEEDLE FILTER BLUNT 18X 1/2SAF (NEEDLE) ×1
NEEDLE FILTER BLUNT 18X1 1/2 (NEEDLE) ×2 IMPLANT
NEEDLE HYPO 22GX1.5 SAFETY (NEEDLE) ×3 IMPLANT
PACK BASIN MAJOR ARMC (MISCELLANEOUS) ×3 IMPLANT
PACK BASIN MINOR ARMC (MISCELLANEOUS) ×3 IMPLANT
PLASMABLADE 3.0S W/LIGHT (MISCELLANEOUS) ×3
SLEVE PROBE SENORX GAMMA FIND (MISCELLANEOUS) IMPLANT
SPONGE DRAIN TRACH 4X4 STRL 2S (GAUZE/BANDAGES/DRESSINGS) ×3 IMPLANT
SPONGE LAP 18X18 RF (DISPOSABLE) ×3 IMPLANT
STRIP CLOSURE SKIN 1/2X4 (GAUZE/BANDAGES/DRESSINGS) ×6 IMPLANT
SUT ETH BLK MONO 3 0 FS 1 12/B (SUTURE) IMPLANT
SUT ETHILON 3-0 (SUTURE) ×3 IMPLANT
SUT MNCRL 4-0 (SUTURE) ×2
SUT MNCRL 4-0 27XMFL (SUTURE) ×4
SUT SILK 2 0 (SUTURE) ×1
SUT SILK 2-0 18XBRD TIE 12 (SUTURE) ×2 IMPLANT
SUT SILK 3 0 (SUTURE) ×1
SUT SILK 3-0 18XBRD TIE 12 (SUTURE) ×2 IMPLANT
SUT VIC AB 2-0 CT1 (SUTURE) ×12 IMPLANT
SUT VIC AB 3-0 SH 27 (SUTURE) ×3
SUT VIC AB 3-0 SH 27X BRD (SUTURE) ×6 IMPLANT
SUTURE MNCRL 4-0 27XMF (SUTURE) ×4 IMPLANT
SYR 10ML LL (SYRINGE) ×3 IMPLANT
SYR BULB IRRIG 60ML STRL (SYRINGE) ×3 IMPLANT
TUBING CONNECTING 10 (TUBING) IMPLANT
WATER STERILE IRR 1000ML POUR (IV SOLUTION) ×3 IMPLANT

## 2020-02-14 NOTE — Interval H&P Note (Signed)
History and Physical Interval Note:  02/14/2020 9:12 AM  [name]  has presented today for surgery, with the diagnosis of Right breast cancer.  The various methods of treatment have been discussed with the patient and family. After consideration of risks, benefits and other options for treatment, the patient has consented to  Procedure(s): Right SIMPLE MASTECTOMY (Right) as a surgical intervention.  The patient's history has been reviewed, patient examined, no change in status, stable for surgery.  I have reviewed the patient's chart and labs.  Questions were answered to the patient's satisfaction.    We will also be removing her right-sided chemo port; this was added to the consent and initialed by the patient, myself, and a witness.    PE: Today's Vitals   02/14/20 0833  BP: (!) 154/70  Pulse: 66  Resp: 16  Temp: 98.3 F (36.8 C)  TempSrc: Temporal  SpO2: 100%  PainSc: 0-No pain   There is no height or weight on file to calculate BMI.  Gen: A&O x 3 CV: RRR Pulm: normal WOB on RA Surgical site: appears markedly improved from last clinic visit.  Port is just cranial to the breast.    Fredirick Maudlin

## 2020-02-14 NOTE — Transfer of Care (Signed)
Immediate Anesthesia Transfer of Care Note  Patient: [name]  Procedure(s) Performed: Right SIMPLE MASTECTOMY (Right Breast) REMOVAL PORT-A-CATH (Right Chest)  Patient Location: PACU  Anesthesia Type:General  Level of Consciousness: awake, alert  and drowsy  Airway & Oxygen Therapy: Patient Spontanous Breathing and Patient connected to face mask oxygen  Post-op Assessment: Report given to RN and Post -op Vital signs reviewed and stable  Post vital signs: Reviewed and stable  Last Vitals:  Vitals Value Taken Time  BP    Temp    Pulse    Resp    SpO2      Last Pain:  Vitals:   02/14/20 0833  TempSrc: Temporal  PainSc: 0-No pain         Complications: No apparent anesthesia complications

## 2020-02-14 NOTE — Op Note (Signed)
Simple Mastectomy Procedure Note  Indications: This patient presents for surgical treatment of breast cancer. She had undergone a prior lumpectomy with positive margins.  Rather than reexcision, she preferred total mastectomy for management.  Pre-operative Diagnosis: right breast cancer  Post-operative Diagnosis: right breast cancer  Surgeon: Fredirick Maudlin   Assistants: none  Anesthesia: General endotracheal anesthesia  ASA Class: 3  Procedure Details  The patient was seen again in the Holding Room. The risks, benefits, indications, potential complications, treatment options, and expected outcomes were discussed with the patient. The possibilities of reaction to medication, pulmonary aspiration, bleeding, recurrent infection, the need for additional procedures, failure to diagnose a condition, and creating a complication requiring transfusion or further operation were discussed with the patient. The patient and/or family concurred with the proposed plan, giving informed consent. The site of surgery was properly noted/marked. The patient was taken to the Operating Room, identified as [name] and the procedure verified as right simple mastectomy. A Time Out was held and the above information confirmed.  The patient was placed supine and general anesthetic was administered. The  , right breast, and chest were prepped and draped in standard fashion. An oblique elliptical incision was made encompassing the nipple. Skin flaps were created meticulously to preserve the subdermal blood supply and maintain a clear margin of resection around the tumor. Dissection was carried down to the pectoralis fascia, which was included with the specimen.  Dissection continued margins consisting of the clavicle superiorly, the sternum medially, the inframammary crease inferiorly, and the lateral margin of the pectoralis muscle, including the tail of Spence in the specimen.    The specimen was marked and  submitted to pathology. The skin flaps were of unequal thickness, so additional inferior and medial tissue was removed off of the flaps and submitted separately to pathology. A #15 chanel drain was placed under the flap and brought out through a separate stab incision. The skin incision was closed in layers with a 3-0 vicryl in the dermis and 4-0 monocryl subcuticular stitch. The drain was secured with a 3-0 nylon suture.  Dermabond and Steri-Strips were applied along with a dry bulky gauze dressing. A breast binder was secured around the patient's chest.  Instrument, sponge, and needle counts were correct at closure and at the conclusion of the case.   Findings: Large seroma cavity from patient's prior lumpectomy.  Estimated Blood Loss: less than 50 mL         Drains: one channel drain, placed as above         Total IV Fluids: see anesthesia record         Specimens: Right breast, additional inferior and medial tissue.  Complications: None; patient tolerated the procedure well.         Disposition: PACU - hemodynamically stable.         Condition: stable

## 2020-02-14 NOTE — Anesthesia Postprocedure Evaluation (Signed)
Anesthesia Post Note  Patient: [name]  Procedure(s) Performed: Right SIMPLE MASTECTOMY (Right Breast) REMOVAL PORT-A-CATH (Right Chest)  Patient location during evaluation: PACU Anesthesia Type: General Level of consciousness: awake and alert Pain management: pain level controlled Vital Signs Assessment: post-procedure vital signs reviewed and stable Respiratory status: spontaneous breathing, nonlabored ventilation, respiratory function stable and patient connected to nasal cannula oxygen Cardiovascular status: blood pressure returned to baseline and stable Postop Assessment: no apparent nausea or vomiting Anesthetic complications: no     Last Vitals:  Vitals:   02/14/20 1300 02/14/20 1311  BP:  (!) 157/72  Pulse: 63 (!) 59  Resp: 15 20  Temp:    SpO2: 98% 97%    Last Pain:  Vitals:   02/14/20 1300  TempSrc:   PainSc: 0-No pain                 Precious Haws [place]

## 2020-02-14 NOTE — Anesthesia Preprocedure Evaluation (Signed)
Anesthesia Evaluation  Patient identified by MRN, date of birth, ID band Patient awake    Reviewed: Allergy & Precautions, H&P , NPO status , Patient's Chart, lab work & pertinent test results  History of Anesthesia Complications (+) PONV, PROLONGED EMERGENCE and history of anesthetic complications  Airway Mallampati: II  TM Distance: >3 FB Neck ROM: full    Dental  (+) Teeth Intact   Pulmonary neg COPD,  +snoring   breath sounds clear to auscultation       Cardiovascular Exercise Tolerance: Poor hypertension, (-) angina(-) Past MI (-) dysrhythmias (-) Valvular Problems/Murmurs Rhythm:regular Rate:Normal     Neuro/Psych negative neurological ROS  negative psych ROS   GI/Hepatic Neg liver ROS, GERD  Controlled,H/o colon cancer   Endo/Other  negative endocrine ROS  Renal/GU    H/o endometrial cancer    Musculoskeletal   Abdominal   Peds  Hematology negative hematology ROS (+)   Anesthesia Other Findings Past Medical History: No date: Arthritis     Comment:  bitateral knees and left foot,s/p cortisone injection in              past No date: Chronic constipation 2018: Colon cancer ([place])     Comment:  pt is undergoing chemo [id number] No date: Complication of anesthesia     Comment:   slow to wake up  2014: Endometrial cancer ([place])     Comment:  Total Hysterectomy and Rad tx's.  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 No date: Menopause     Comment:  age 60 No date: PONV (postoperative nausea and vomiting) 06/07/10: Vitamin D deficiency  Past Surgical History: 12/04/2019: BREAST BIOPSY; Right     Comment:  [place] bx-INVASIVE MAMMARY  CARCINOMA 01/03/2020: BREAST LUMPECTOMY; Right No date: BUNIONECTOMY 09/24/2018: CATARACT EXTRACTION W/PHACO; Left     Comment:  Procedure: CATARACT EXTRACTION PHACO AND INTRAOCULAR               LENS PLACEMENT ([place]) LEFT;  Surgeon: Eulogio Bear,               MD;  Location: [place];  Service:               Ophthalmology;  Laterality: Left; 10/15/2018: CATARACT EXTRACTION W/PHACO; Right     Comment:  Procedure: CATARACT EXTRACTION PHACO AND INTRAOCULAR               LENS PLACEMENT ([facility]) RIGHT;  Surgeon: Eulogio Bear,              MD;  Location: [place];  Service:               Ophthalmology;  Laterality: Right; No date: cataract surgery     Comment:  both eyes 04/04/2017: COLONOSCOPY WITH PROPOFOL; N/A     Comment:  Procedure: COLONOSCOPY WITH PROPOFOL;  Surgeon: [name], MD;  Location: [facility] ENDOSCOPY;  Service:               Endoscopy;  Laterality: N/A; 05/22/2018: COLONOSCOPY WITH PROPOFOL; N/A     Comment:  Procedure:  COLONOSCOPY WITH PROPOFOL;  Surgeon: [name], MD;  Location: [facility] ENDOSCOPY;  Service:               Endoscopy;  Laterality: N/A; 1971: DILATION AND CURETTAGE OF UTERUS No date: FOOT SURGERY No date: NASAL SINUS SURGERY 07/12/2017: PORTACATH PLACEMENT; Right     Comment:  Procedure: INSERTION PORT-A-CATH;  Surgeon: Jules Husbands, MD;  Location: [facility] ORS;  Service: General;                Laterality: Right; 06/07/2017: ROBOTIC ASSISTED LAPAROSCOPIC VENTRAL/INCISIONAL HERNIA  REPAIR; N/A     Comment:  Procedure: ROBOTIC [place];  Surgeon:               Leighton Ruff, MD;  Location: [facility] ORS;  Service: General;              Laterality: N/A; No date: VAGINAL DELIVERY No date: VAGINAL HYSTERECTOMY     Reproductive/Obstetrics negative OB ROS                             Anesthesia Physical  Anesthesia Plan  ASA: III  Anesthesia Plan:  General ETT   Post-op Pain Management:    Induction: Intravenous  PONV Risk Score and Plan: Ondansetron, Dexamethasone, Treatment may vary due to age or medical condition, Propofol infusion and TIVA  Airway Management Planned: Oral ETT  Additional Equipment:   Intra-op Plan:   Post-operative Plan: Extubation in OR  Informed Consent: I have reviewed the patients History and Physical, chart, labs and discussed the procedure including the risks, benefits and alternatives for the proposed anesthesia with the patient or authorized representative who has indicated his/her understanding and acceptance.     Dental Advisory Given  Plan Discussed with: Anesthesiologist  Anesthesia Plan Comments:         Anesthesia Quick Evaluation

## 2020-02-14 NOTE — Anesthesia Procedure Notes (Signed)
Procedure Name: Intubation Date/Time: 02/14/2020 9:47 AM Performed by: Gentry Fitz, CRNA Pre-anesthesia Checklist: Patient identified, Emergency Drugs available, Suction available and Patient being monitored Patient Re-evaluated:Patient Re-evaluated prior to induction Oxygen Delivery Method: Circle system utilized Preoxygenation: Pre-oxygenation with 100% oxygen Induction Type: IV induction Ventilation: Mask ventilation without difficulty Laryngoscope Size: Mac and 4 Grade View: Grade I Tube type: Oral Tube size: 7.0 mm Number of attempts: 1 Airway Equipment and Method: Stylet Placement Confirmation: ETT inserted through vocal cords under direct vision,  positive ETCO2 and breath sounds checked- equal and bilateral Secured at: 22 cm Tube secured with: Tape Dental Injury: Teeth and Oropharynx as per pre-operative assessment

## 2020-02-14 NOTE — Op Note (Signed)
Operative Note  Preoperative Diagnosis: Port-A-Cath in place, chemotherapy completed  Postoperative Diagnosis: Same  Operation: Removal of Port-A-Cath  Surgeon: Fredirick Maudlin, MD  Assistant: None  Anesthesia: General endotracheal  Findings: Well encapsulated Port-A-Cath without evidence of infection  Indications: This is a 79 year old woman who underwent chemotherapy for colon cancer.  She is completed her course of treatment.  She is here for simple mastectomy, and expressed a desire to have her Port-A-Cath removed.  The risks of the procedure were discussed with her and she agreed to proceed.  Procedure In Detail: Patient was identified in the preoperative holding area and brought to the operating room where she was placed supine on the OR table.  All bony prominences were padded and bilateral sequential compression devices were placed on the lower extremities.  General endotracheal anesthesia was induced without incident.  She was sterilely prepped and draped in standard fashion.  A timeout was performed confirming the patient's identity, the procedure being performed, her allergies, all necessary equipment was available, and that maintenance anesthesia was adequate.  The skin overlying the port was infiltrated with a one-to-one mixture of 0.25% bupivacaine and 1% lidocaine with epinephrine.  The prior scar was incised and dissection carried down using a combination of blunt dissection electrocautery.  The port was identified.  The sutures were cut and the port was able to be removed without difficulty.  The all of the tubing appeared to be intact.  The capsule surrounding the port site was excised to minimize the risk of seroma.  The incision was then closed in 2 layers.  3-0 Vicryl was used to reapproximate the deep dermal layers and 4-0 Monocryl was used to reapproximate the skin in a running subcuticular fashion.  I then proceeded to perform the simple mastectomy.  After completing that  operation, I applied Dermabond and Steri-Strips to the port site.  Please see the mastectomy note for the remainder of the case specifics.  EBL: Less than 1 cc  IVF: See anesthesia record  Specimen(s): Port-A-Cath  Complications: none immediately apparent.   Counts: all needles, instruments, and sponges were counted and reported to be correct in number at the end of the case.   I was present for and participated in the entire operation.  Fredirick Maudlin 12:16 PM

## 2020-02-15 DIAGNOSIS — Z452 Encounter for adjustment and management of vascular access device: Secondary | ICD-10-CM | POA: Diagnosis not present

## 2020-02-15 DIAGNOSIS — C50911 Malignant neoplasm of unspecified site of right female breast: Secondary | ICD-10-CM | POA: Diagnosis not present

## 2020-02-15 MED ORDER — CEPHALEXIN 250 MG PO CAPS: 250.0000 mg | ORAL_CAPSULE | Freq: Two times a day (BID) | ORAL | 0 refills | Status: AC

## 2020-02-15 NOTE — Discharge Instructions (Signed)
  Diet: Resume home heart healthy regular diet.   Activity: No heavy lifting >20 pounds (children, pets, laundry, garbage) or strenuous activity until follow-up, but light activity and walking are encouraged. Do not drive or drink alcohol if taking narcotic pain medications.  Wound care: Remove dressing tomorrow. Once dressing removed, may shower with soapy water and pat dry (do not rub incisions), but no baths or submerging incision underwater until follow-up. (no swimming)   Medications: Resume all home medications. For mild to moderate pain: acetaminophen (Tylenol) or ibuprofen (if no kidney disease). Combining Tylenol with alcohol can substantially increase your risk of causing liver disease. Narcotic pain medications, if prescribed, can be used for severe pain, though may cause nausea, constipation, and drowsiness. If you do not need the narcotic pain medication, you do not need to fill the prescription.

## 2020-02-15 NOTE — Progress Notes (Signed)
[name] to be D/C'd Home per MD order.  Discussed prescriptions and follow up appointments with the patient. Prescriptions given to patient, medication list explained in detail. Pt verbalized understanding.  Allergies as of 02/15/2020      Reactions   Oxycodone Nausea And Vomiting   Codeine Nausea And Vomiting      Medication List    TAKE these medications   cephALEXin 250 MG capsule Commonly known as: KEFLEX Take 1 capsule (250 mg total) by mouth 2 (two) times daily for 10 days.   fluticasone 50 MCG/ACT nasal spray Commonly known as: FLONASE Place 2 sprays into both nostrils daily.   ICAPS AREDS 2 PO Take 1 capsule by mouth 2 (two) times daily.   losartan 50 MG tablet Commonly known as: COZAAR Take 1 tablet (50 mg total) by mouth daily. What changed: when to take this   metoprolol succinate 100 MG 24 hr tablet Commonly known as: TOPROL-XL TAKE 1 TABLET DAILY What changed: when to take this   multivitamin with minerals Tabs tablet Take 1 tablet by mouth daily.   phenylephrine 10 MG Tabs tablet Commonly known as: SUDAFED PE Take 10 mg by mouth every 4 (four) hours as needed (for allergies/congestion.).   topiramate 25 MG tablet Commonly known as: TOPAMAX Take 25 mg by mouth 2 (two) times daily. Take with 50 mg twice daily   topiramate 50 MG tablet Commonly known as: TOPAMAX TAKE 1 TABLET TWICE DAILY   topiramate 100 MG tablet Commonly known as: TOPAMAX Take by mouth.       Vitals:   02/14/20 2129 02/15/20 0529  BP: (!) 148/72 (!) 151/65  Pulse: 71 (!) 55  Resp: 16 16  Temp: 98.6 F (37 C) (!) 97.5 F (36.4 C)  SpO2: 97% 99%    Skin clean, dry and intact without evidence of skin break down, no evidence of skin tears noted. IV catheter discontinued intact. Site without signs and symptoms of complications. Dressing and pressure applied. Pt denies pain at this time. No complaints noted.  An After Visit Summary was printed and given to the  patient. Patient escorted via [place], and D/C home via private auto.  Fuller Mandril, RN

## 2020-02-15 NOTE — Discharge Summary (Addendum)
  Patient ID: AISSA FERNICOLA MRN: [id number] DOB/AGE: 11/26/1940 79 y.o.  Admit date: 02/14/2020 Discharge date: 02/15/2020   Discharge Diagnoses:  Active Problems:   History of colon cancer   S/P mastectomy, right   Procedures: Right total mastectomy  Hospital Course: Patient underwent right total mastectomy for right breast cancer.  She tolerated the procedure well.  This morning patient without pain.  She has not taken any pain medications.  She is tolerating diet.  Wound looks dry and clean.  Flaps are viable without any ischemic signs.  There is no fluid collection or hematomas.  Physical Exam  Constitutional: She is oriented to person, place, and time.  Cardiovascular: Normal rate.  Pulmonary/Chest: Effort normal.  Abdominal: Soft.  Musculoskeletal:     Cervical back: Normal range of motion.  Neurological: She is alert and oriented to person, place, and time.  Chest: Skin flaps healthy without sign of ischemia.  Minimal bruise.  There is no fluid collection or hematomas.  Drain in place.   Consults: None  Disposition: Discharge disposition: 01-Home or Self Care       Discharge Instructions    Diet - low sodium heart healthy   Complete by: As directed    Increase activity slowly   Complete by: As directed      Allergies as of 02/15/2020      Reactions   Oxycodone Nausea And Vomiting   Codeine Nausea And Vomiting      Medication List    TAKE these medications   cephALEXin 250 MG capsule Commonly known as: KEFLEX Take 1 capsule (250 mg total) by mouth 2 (two) times daily for 10 days.   fluticasone 50 MCG/ACT nasal spray Commonly known as: FLONASE Place 2 sprays into both nostrils daily.   ICAPS AREDS 2 PO Take 1 capsule by mouth 2 (two) times daily.   losartan 50 MG tablet Commonly known as: COZAAR Take 1 tablet (50 mg total) by mouth daily. What changed: when to take this   metoprolol succinate 100 MG 24 hr tablet Commonly known as: TOPROL-XL TAKE 1  TABLET DAILY What changed: when to take this   multivitamin with minerals Tabs tablet Take 1 tablet by mouth daily.   phenylephrine 10 MG Tabs tablet Commonly known as: SUDAFED PE Take 10 mg by mouth every 4 (four) hours as needed (for allergies/congestion.).   topiramate 25 MG tablet Commonly known as: TOPAMAX Take 25 mg by mouth 2 (two) times daily. Take with 50 mg twice daily   topiramate 50 MG tablet Commonly known as: TOPAMAX TAKE 1 TABLET TWICE DAILY   topiramate 100 MG tablet Commonly known as: TOPAMAX Take by mouth.      Follow-up Information    Fredirick Maudlin, MD Follow up in 2 week(s).   Specialty: General Surgery Contact information: [address] [place] [address] [phone]          This discharge encounter was more than 30 minutes most of the time counseling the patient and coordinating plan of care.

## 2020-02-17 ENCOUNTER — Telehealth: Payer: Self-pay | Admitting: General Surgery

## 2020-02-17 NOTE — Telephone Encounter (Signed)
Called patient back and questions were answered. Pt voiced understanding and has no further concerns.

## 2020-02-17 NOTE — Telephone Encounter (Signed)
Patient is calling and is asking for one of the nurses to give a call back. Please call patient and advise.

## 2020-02-19 LAB — SURGICAL PATHOLOGY

## 2020-02-20 ENCOUNTER — Telehealth: Payer: Self-pay | Admitting: General Surgery

## 2020-02-20 NOTE — Telephone Encounter (Signed)
Discussed the pathology from her mastectomy.  No additional tumor found.  She reports that she is doing well and is having no issues managing her drain.  I will see her at her scheduled postop visit.

## 2020-02-24 DIAGNOSIS — H353190 Nonexudative age-related macular degeneration, unspecified eye, stage unspecified: Secondary | ICD-10-CM | POA: Diagnosis not present

## 2020-02-27 ENCOUNTER — Other Ambulatory Visit: Payer: Self-pay

## 2020-02-27 ENCOUNTER — Ambulatory Visit (INDEPENDENT_AMBULATORY_CARE_PROVIDER_SITE_OTHER): Payer: Self-pay | Admitting: General Surgery

## 2020-02-27 ENCOUNTER — Encounter: Payer: Self-pay | Admitting: General Surgery

## 2020-02-27 VITALS — BP 156/74 | HR 85 | Temp 97.1°F | Resp 12 | Wt 225.0 lb

## 2020-02-27 DIAGNOSIS — Z9011 Acquired absence of right breast and nipple: Secondary | ICD-10-CM

## 2020-02-27 NOTE — Patient Instructions (Signed)
Dr Celine Ahr removed your drain today. She has placed a piece of gauze on your incision site. You may remove this tomorrow. If you still have fluid coming out you may place a dry gauze over the area until it stops draining. You may use a sports bra or a bra with no wire to help with support.   Keep your appointment with Oncology tomorrow. Follow up as needed. Call the office if you have any questions or concerns.   Total or Modified Radical Mastectomy, Care After This sheet gives you information about how to care for yourself after your procedure. Your health care provider may also give you more specific instructions. If you have problems or questions, contact your health care provider. What can I expect after the procedure? After the procedure, it is common to have:  Pain.  Numbness.  Stiffness in the arm or shoulder.  Feelings of stress, sadness, or depression. If the lymph nodes under your arm were removed, you may have arm swelling, weakness, or numbness on the same side of your body as your surgery. Follow these instructions at home: Incision care   Follow instructions from your health care provider about how to take care of your incision. Make sure you: ? Wash your hands with soap and water before you change your bandage (dressing). If soap and water are not available, use hand sanitizer. ? Change your dressing as told by your health care provider. ? Leave stitches (sutures), skin glue, or adhesive strips in place. These skin closures may need to stay in place for 2 weeks or longer. If adhesive strip edges start to loosen and curl up, you may trim the loose edges. Do not remove adhesive strips completely unless your health care provider tells you to do that.  Check your incision area every day for signs of infection. Check for: ? Redness, swelling, or more pain. ? Fluid or blood. ? Warmth. ? Pus or a bad smell.  If you were sent home with a surgical drain in place, follow  instructions from your health care provider about emptying it. Bathing  Do not take baths, swim, or use a hot tub until your health care provider approves. Ask your health care provider if you may take showers. You may only be allowed to take sponge baths. Activity  Return to your normal activities as told by your health care provider. Ask your health care provider what activities are safe for you.  Avoid activities that take a lot of effort.  Be careful to avoid any activities that could cause an injury to your arm on the side of your surgery.  Do not lift anything that is heavier than 10 lb (4.5 kg), or the limit that you are told, until your health care provider says that it is safe.  Avoid lifting with the arm on the side of your surgery.  Do not carry heavy objects on your shoulder.  After your drain is removed, do exercises to prevent stiffness and swelling in your arm. Talk with your health care provider about which exercises are safe for you. General instructions  Take over-the-counter and prescription medicines only as told by your health care provider.  You may eat what you usually do.  Keep your arm raised (elevated) above the level of your heart when you are sitting or lying down.  Do not wear tight jewelry on your arm, wrist, or fingers on the side of your surgery.  You may be given a tight sleeve (compression  bandage) to wear over your arm on the side of your surgery. Wear this sleeve as told by your health care provider.  Ask your health care provider when you can start wearing a bra or using a breast prosthesis.  Before you are involved in certain procedures such as giving blood or having your blood pressure checked, tell all your health care providers if lymph nodes under your arm were removed. This is important information. Follow-up  Keep all follow-up visits as told by your health care provider. This is important.  Get checked for extra fluid around your  lymph nodes (lymphedema) as often as told by your health care provider. Contact a health care provider if:  You have a fever.  Your pain medicine is not working.  Your arm swelling, weakness, or numbness has not improved after a few weeks.  You have new swelling in your breast area or arm.  You have redness, swelling, or more pain in your incision area.  You have fluid or blood coming from your incision.  Your incision feels warm to the touch.  You have pus or a bad smell coming from your incision. Get help right away if:  You have very bad pain in your breast area or arm.  You have chest pain.  You have difficulty breathing. Summary  Follow instructions from your health care provider about how to take care of your incision. Check your incision area every day for signs of infection.  Ask your health care provider what activities are safe for you.  Keep all follow-up visits as told by your health care provider. This is important.  Make sure you know which symptoms should cause you to contact your health care provider or to get help right away. This information is not intended to replace advice given to you by your health care provider. Make sure you discuss any questions you have with your health care provider. Document Revised: 01/04/2019 Document Reviewed: 08/04/2017 Elsevier Patient Education  2020 [employer].

## 2020-02-27 NOTE — Progress Notes (Signed)
[name] is here today for a postoperative visit.  She is a 79 year old woman upon whom I initially performed a wire localized lumpectomy for right breast cancer.  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  She underwent this procedure on 14 February 2020.  Final pathology demonstrated no residual tumor present.  She was discharged with a drain and brought a record of its output today.  She denies any fevers or chills.  No nausea or vomiting.  She has a good appetite and states that she has not experienced any postoperative pain.  Once again, for reasons that remain unclear to me, she has not removed her breast binder since she was discharged from the hospital.  Today's Vitals   02/27/20 1016  BP: (!) 156/74  Pulse: 85  Resp: 12  Temp: (!) 97.1 F (36.2 C)  SpO2: 95%  Weight: 225 lb (102.1 kg)  PainSc: 0-No pain   Body mass index is 37.44 kg/m. Focused examination demonstrates that the Steri-Strips are still in place.  They are somewhat macerated, as might be expected from having not been allowed to breathe since her operation.  These were removed.  The underlying skin is similarly a bit macerated, but I do not appreciate any signs of infection.  The drain has a small amount of serosanguineous fluid in it and has only been putting out about 10 cc/day for the last couple of days.  The port removal site has healed beautifully.  I removed her drain today.  I applied a dry gauze dressing to her incision and told her to take it off tomorrow.  She should shower and use a mild soap to clean the area.  I have recommended that she continue to wear a compressive and supportive undergarment without underwire.  She has follow-up with her oncologist tomorrow.  I will see her on an as-needed basis.

## 2020-02-28 ENCOUNTER — Inpatient Hospital Stay: Payer: PPO | Attending: Oncology | Admitting: Oncology

## 2020-02-28 ENCOUNTER — Telehealth: Payer: Self-pay

## 2020-02-28 ENCOUNTER — Encounter: Payer: Self-pay | Admitting: Oncology

## 2020-02-28 VITALS — BP 143/59 | HR 56 | Temp 97.0°F | Resp 18 | Wt 224.0 lb

## 2020-02-28 DIAGNOSIS — M8580 Other specified disorders of bone density and structure, unspecified site: Secondary | ICD-10-CM | POA: Insufficient documentation

## 2020-02-28 DIAGNOSIS — Z85038 Personal history of other malignant neoplasm of large intestine: Secondary | ICD-10-CM | POA: Insufficient documentation

## 2020-02-28 DIAGNOSIS — Z170 Estrogen receptor positive status [ER+]: Secondary | ICD-10-CM | POA: Diagnosis not present

## 2020-02-28 DIAGNOSIS — C50911 Malignant neoplasm of unspecified site of right female breast: Secondary | ICD-10-CM

## 2020-02-28 DIAGNOSIS — Z86718 Personal history of other venous thrombosis and embolism: Secondary | ICD-10-CM | POA: Diagnosis not present

## 2020-02-28 DIAGNOSIS — Z9011 Acquired absence of right breast and nipple: Secondary | ICD-10-CM | POA: Diagnosis not present

## 2020-02-28 DIAGNOSIS — Z79899 Other long term (current) drug therapy: Secondary | ICD-10-CM | POA: Insufficient documentation

## 2020-02-28 MED ORDER — ANASTROZOLE 1 MG PO TABS: 1.0000 mg | ORAL_TABLET | Freq: Every day | ORAL | 1 refills | Status: DC

## 2020-02-28 NOTE — Progress Notes (Signed)
Hematology/Oncology Follow Up visit. [place] [phone] [phone]  CONSULT NOTE Patient Care Team: Burnard Hawthorne, FNP as PCP - General (Family Medicine) Vladimir Crofts, MD (Neurology) Noreene Filbert, MD as Referring Physician (Radiation Oncology) Leighton Ruff, MD as Consulting Physician (General Surgery) Earlie Server, MD as Consulting Physician (Oncology) Jules Husbands, MD as Consulting Physician (General Surgery) Rico Junker, RN as Registered Nurse Theodore Demark, RN as Registered Nurse  PURPOSE OF CONSULTATION:  Follow up for blood clot, and stage IIIB colon cancer, Stage IA right breast cancer  HISTORY OF PRESENTING ILLNESS:  [name] 79 y.o.  female with PMH listed as below was referred to me for evaluation and management of colon cancer.  She had colonoscopy evaluation due to positive cologuard.  She was found to have a nonobstructing mass approximately 20 cm from the anal verge which biopsy proved to be adenocarcinoma. CT scan showed a large mass from the sigmoid and extends beyond the wall of the colon toward the left into the surrounding fat. Preoperational CEA was normal. Patient underwent robotic sigmoidectomy on 06/07/2017.   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 MLH1  loss of function, PMS2 loss of function. Declined option of clinical trial ATOMIC She has a history of endometrial cancer s/p hysterotomy and radiation. She has a family history of colon cancer and breast cancer.  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 Results have been explained to patient by genetic counselor.   # Thyroid nodule, incidental finding from CT surveillance for colon cancer. Discussed with patient's primary care provider [name].  Patient to  follow-up with PCP for further evaluation with ultrasound thyroid.   Oncology treatment S/p adjuvant chemotherapy finished February 2019 Adjuvant RT finished in April 2019.  05/22/2018 Colonoscopy surveillance by Dr.Wohl showed 10 mm polyp in ascending colon.  Removed.  Patent end-to-end colo-colonic anastomosis Pathology showed benign polyp.  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   # mammogram on 11/19/2019.  Right breast mass warrants further evaluation. Right diagnostic mammogram showed 6 x 5 x 7 mm lobular hypoechoic mass, 7:00, 3 cm from nipple.  No right axillary adenopathy ultrasound-guided core needle biopsy was obtained. Pathology showed invasive mammary carcinoma, no special type.  Grade 3, ER/PR strongly positive.  90%, HER-2 negative.  # 01/03/2020 underwent right lumpectomy with SLNB Invasive mammary carcinoma, grade 3, DCIS present, margin is uninvolved by invasive carcinoma, positive for DCIS, multifocal, superior.  pT1c pN0 (sn). #OncotypeDx recurrence score 8, absolute chemotherapy benefit <1% February 2021, patient had Mediport removed.  01/22/20 Bilateral breast MRI was obtain for further evaluation of extent of remaining DCIS.  Image showed seroma with diffuse non mass enhancement in the lower outer right breast extending from mid to posterior depth. Skin thickening. no focally suspicious mass is identified along the margins of the seroma or in the remainder of the right breast.Indeterminate 1cm focus of linear enhancement in the central left breast.  01/21/20 left breast biopsy was negative for malignancy.  INTERVAL HISTORY 79 yo female with above history presents for follow up for history of colon cancer,  newly diagnosed breast cancer.  And  unprovoked DVT. 02/14/2020 patient underwent right breast mastectomy.  No residual malignancy was discovered. Patient reports feeling well today.  Denies any concerns regarding her surgical sites, no  erythema, soreness, discharge.  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 Denies any bleeding events.  She has no new complaints.    Review of Systems  Constitutional: Negative for appetite change, chills, fatigue and fever.  HENT:   Negative for hearing loss and voice change.   Eyes: Negative for eye problems.  Respiratory: Negative for chest tightness and cough.   Cardiovascular: Negative for chest pain and leg swelling.  Gastrointestinal: Negative for abdominal distention, abdominal pain, blood in stool and diarrhea.  Endocrine: Negative for hot flashes.  Genitourinary: Negative for difficulty urinating and frequency.   Musculoskeletal: Negative for arthralgias.  Skin: Negative for itching and rash.  Neurological: Negative for extremity weakness.  Hematological: Negative for adenopathy.  Psychiatric/Behavioral: Negative for confusion.   MEDICAL HISTORY:  Past Medical History:  Diagnosis Date  . Arthritis    bitateral knees and left foot,s/p cortisone injection in past  . Chronic constipation   . Colon cancer ([place]) 2018   pt is undergoing chemo 11-09-17  . Complication of anesthesia     slow to wake up   . Endometrial cancer ([place]) 2014   Total Hysterectomy and Rad tx's.   . Family history of cancer   . Genetic testing 09/26/2017    Multi-Cancer panel (83 genes) @ [facility] - No pathogenic mutations detected  . Hypertension   . IBS (irritable bowel syndrome)   . Macular degeneration of both eyes   . Menopause    age 43  . PONV (postoperative nausea and vomiting)   . Vitamin D deficiency 06/07/10    SURGICAL HISTORY: Past Surgical History:  Procedure Laterality Date  . BREAST BIOPSY Right 12/04/2019   [place] bx-INVASIVE MAMMARY CARCINOMA  . BREAST LUMPECTOMY Right 01/03/2020  . BREAST LUMPECTOMY WITH NEEDLE LOCALIZATION AND AXILLARY SENTINEL LYMPH NODE BX Right 01/03/2020   Procedure: BREAST LUMPECTOMY WITH NEEDLE LOCALIZATION AND  AXILLARY SENTINEL LYMPH NODE BX;  Surgeon: Fredirick Maudlin, MD;  Location: [facility];  Service: General;  Laterality: Right;  . BUNIONECTOMY    . CATARACT EXTRACTION W/PHACO Left 09/24/2018   Procedure: CATARACT EXTRACTION PHACO AND INTRAOCULAR LENS PLACEMENT ([place]) LEFT;  Surgeon: Eulogio Bear, MD;  Location: [facility];  Service: Ophthalmology;  Laterality: Left;  . CATARACT EXTRACTION W/PHACO Right 10/15/2018   Procedure: CATARACT EXTRACTION PHACO AND INTRAOCULAR LENS PLACEMENT ([place]) RIGHT;  Surgeon: Eulogio Bear, MD;  Location: [facility];  Service: Ophthalmology;  Laterality: Right;  . cataract surgery     both eyes  . COLONOSCOPY WITH PROPOFOL N/A 04/04/2017   Procedure: COLONOSCOPY WITH PROPOFOL;  Surgeon: [name], MD;  Location: [facility];  Service: Endoscopy;  Laterality: N/A;  . COLONOSCOPY WITH PROPOFOL N/A 05/22/2018   Procedure: COLONOSCOPY WITH PROPOFOL;  Surgeon: [name], MD;  Location: [facility] ENDOSCOPY;  Service: Endoscopy;  Laterality: N/A;  . DILATION AND CURETTAGE OF UTERUS  1971  . FOOT SURGERY    . NASAL SINUS SURGERY    . PORT-A-CATH REMOVAL Right 02/14/2020   Procedure: REMOVAL PORT-A-CATH;  Surgeon: Fredirick Maudlin, MD;  Location: [facility];  Service: General;  Laterality: Right;  . PORTACATH PLACEMENT Right 07/12/2017   Procedure: INSERTION PORT-A-CATH;  Surgeon: Jules Husbands, MD;  Location: [facility];  Service: General;  Laterality: Right;  . ROBOTIC ASSISTED LAPAROSCOPIC VENTRAL/INCISIONAL HERNIA REPAIR N/A 06/07/2017   Procedure: ROBOTIC INCISIONAL HERNIA REPAIR;  Surgeon: Leighton Ruff, MD;  Location: [facility];  Service: General;  Laterality: N/A;  . SIMPLE MASTECTOMY WITH AXILLARY SENTINEL NODE BIOPSY Right 02/14/2020   Procedure: Right SIMPLE MASTECTOMY;  Surgeon: Fredirick Maudlin, MD;  Location: [facility];  Service: General;  Laterality: Right;  Marland Kitchen VAGINAL DELIVERY    . VAGINAL HYSTERECTOMY      SOCIAL HISTORY: Social History     Socioeconomic History  . Marital status: Divorced    Spouse name: Not on file  . Number of children: Not on file  . Years of education: Not on file  . Highest education level: Not on file  Occupational History  . Not on file  Tobacco Use  . Smoking status: Never Smoker  . Smokeless tobacco: Never Used  Substance and Sexual Activity  . Alcohol use: Yes    Comment: rarely - Holidays  . Drug use: No  . Sexual activity: Never  Other Topics Concern  . Not on file  Social History Narrative   Exercise- aerobic exercise at home 3-4x week.       Lives in [place]. No pets.   Social Determinants of Health   Financial Resource Strain: Low Risk   . Difficulty of Paying Living Expenses: Not hard at all  Food Insecurity: No Food Insecurity  . Worried About Charity fundraiser in the Last Year: Never true  . Ran Out of Food in the Last Year: Never true  Transportation Needs: No Transportation Needs  . Lack of Transportation (Medical): No  . Lack of Transportation (Non-Medical): No  Physical Activity:   . Days of Exercise per Week:   . Minutes of Exercise per Session:   Stress: No Stress Concern Present  . Feeling of Stress : Not at all  Social Connections: Unknown  . Frequency of Communication with Friends and Family: More than three times a week  . Frequency of Social Gatherings with Friends and Family: More than three times a week  . Attends Religious Services: 1 to 4 times per year  . Active Member of Clubs or Organizations: Yes  . Attends Archivist Meetings: 1 to 4 times per year  . Marital Status: Not on file  Intimate Partner Violence: Not At Risk  . Fear of Current or Ex-Partner: No  . Emotionally Abused: No  . Physically Abused: No  . Sexually Abused: No    FAMILY HISTORY: Family History  Problem Relation Age of Onset  . Heart disease Father 82       massive MI  . Diabetes Father   . Bipolar disorder Sister   . Osteoporosis Sister   . Breast  cancer Sister 74       currently 70  . Cancer Sister        breast  . Bipolar disorder Maternal Aunt   . Breast cancer Maternal Aunt        age at dx unknown; deceased 70s  . Colon cancer Maternal Grandfather 87       deceased 47  . Cancer Maternal Grandfather        breast  . Osteoporosis Mother   . Colon cancer Mother        age at dx unknown; deceased 33  . Cancer Maternal Grandmother        unk. type; deceased 11  . Breast cancer Paternal Aunt        deceased 84    ALLERGIES:  is allergic to oxycodone and codeine.  MEDICATIONS:  Current Outpatient Medications  Medication Sig Dispense Refill  .  fluticasone (FLONASE) 50 MCG/ACT nasal spray Place 2 sprays into both nostrils daily. 16 g 2  . losartan (COZAAR) 50 MG tablet Take 1 tablet (50 mg total) by mouth daily. (Patient taking differently: Take 50 mg by mouth every morning. ) 90 tablet 3  . metoprolol succinate (TOPROL-XL) 100 MG 24 hr tablet TAKE 1 TABLET DAILY (Patient taking differently: Take 100 mg by mouth every evening. ) 90 tablet 3  . Multiple Vitamin (MULTIVITAMIN WITH MINERALS) TABS tablet Take 1 tablet by mouth daily.    . Multiple Vitamins-Minerals (ICAPS AREDS 2 PO) Take 1 capsule by mouth 2 (two) times daily.     Marland Kitchen topiramate (TOPAMAX) 100 MG tablet Take by mouth 2 (two) times daily.     Marland Kitchen anastrozole (ARIMIDEX) 1 MG tablet Take 1 tablet (1 mg total) by mouth daily. 30 tablet 1  . phenylephrine (SUDAFED PE) 10 MG TABS tablet Take 10 mg by mouth every 4 (four) hours as needed (for allergies/congestion.).      No current facility-administered medications for this visit.      Marland Kitchen  PHYSICAL EXAMINATION: ECOG PERFORMANCE STATUS: 0 - Asymptomatic Vitals:   02/28/20 1039  BP: (!) 143/59  Pulse: (!) 56  Resp: 18  Temp: (!) 97 F (36.1 C)  SpO2: 100%   Filed Weights   02/28/20 1039  Weight: 224 lb (101.6 kg)   Physical Exam Constitutional:      General: She is not in acute distress. HENT:     Head:  Normocephalic and atraumatic.  Eyes:     General: No scleral icterus. Cardiovascular:     Rate and Rhythm: Normal rate and regular rhythm.     Heart sounds: Normal heart sounds.  Pulmonary:     Effort: Pulmonary effort is normal. No respiratory distress.     Breath sounds: No wheezing.  Abdominal:     General: Bowel sounds are normal. There is no distension.     Palpations: Abdomen is soft.  Musculoskeletal:        General: No deformity. Normal range of motion.     Cervical back: Normal range of motion and neck supple.  Skin:    General: Skin is warm and dry.     Findings: No erythema or rash.  Neurological:     Mental Status: She is alert and oriented to person, place, and time. Mental status is at baseline.     Cranial Nerves: No cranial nerve deficit.     Coordination: Coordination normal.  Psychiatric:        Mood and Affect: Mood normal.   .    LABORATORY DATA:  I have reviewed the data as listed Lab Results  Component Value Date   WBC 6.3 02/14/2020   HGB 12.6 02/14/2020   HCT 40.8 02/14/2020   MCV 93.4 02/14/2020   PLT 208 02/14/2020   Recent Labs    08/02/19 0917 08/02/19 0917 08/12/19 1503 10/31/19 0920 10/31/19 0920 01/22/20 1105 01/29/20 0914 02/14/20 1707  NA 141   < > 139 142  --   --  140  --   K 3.8   < > 4.2 4.2  --   --  3.8  --   CL 110   < > 108 112*  --   --  111  --   CO2 21*   < > 24 23  --   --  21*  --   GLUCOSE 135*   < > 79 123*  --   --  147*  --   BUN 18   < > 17 18  --   --  14  --   CREATININE 0.92   < > 0.89 0.89   < > 0.90 1.03* 1.04*  CALCIUM 8.7*   < > 9.3 9.2  --   --  8.9  --   GFRNONAA 60*   < >  --  >60  --   --  52* 51*  GFRAA >60   < >  --  >60  --   --  >60 60*  PROT 6.3*  --   --  6.7  --   --  6.8  --   ALBUMIN 3.5  --   --  3.6  --   --  3.7  --   AST 23  --   --  22  --   --  22  --   ALT 16  --   --  17  --   --  15  --   ALKPHOS 97  --   --  104  --   --  98  --   BILITOT 0.5  --   --  0.7  --   --  0.6  --     < > = values in this interval not displayed.    RADIOGRAPHIC STUDIES: I have personally reviewed the radiological images as listed and agreed with the findings in the report. 04/12/2017 CT abdomen pelvis w contrast Large mass arising from the sigmoid colon. This mass extends beyond the wall of the colon toward the left into the surrounding fat. This lesion does not reach the left pelvic sidewall.No adenopathy or liver lesions evident. No omental lesions appreciable. Sizable midline ventral hernia slightly superior to theumbilicus containing fat and a loop of colon without bowel compromise. Much smaller midline umbilical ventral hernia containingonly fat. There is a small hiatal hernia. No bowel obstruction.  No abscess.  Appendix region appears normal. No renal or ureteral calculus.  No hydronephrosis.There is aortoiliac atherosclerosis. There are foci of coronary artery calcification.  RADIOGRAPHIC STUDIES: I have personally reviewed the radiological images as listed and agreed with the findings in the report. CT Chest W Contrast  Result Date: 01/27/2020 CLINICAL DATA:  79 year old female with history of colorectal cancer. Follow-up study. Additional history of lumpectomy in the right breast. EXAM: CT CHEST, ABDOMEN, AND PELVIS WITH CONTRAST TECHNIQUE: Multidetector CT imaging of the chest, abdomen and pelvis was performed following the standard protocol during bolus administration of intravenous contrast. CONTRAST:  184m OMNIPAQUE IOHEXOL 300 MG/ML  SOLN COMPARISON:  None. FINDINGS: CT CHEST FINDINGS Cardiovascular: Heart size is normal. There is no significant pericardial fluid, thickening or pericardial calcification. There is aortic atherosclerosis, as well as atherosclerosis of the great vessels of the mediastinum and the coronary arteries, including calcified atherosclerotic plaque in the left main, left anterior descending and right coronary arteries. Right subclavian single-lumen porta  cath with tip terminating in the distal superior vena cava. Mediastinum/Nodes: No pathologically enlarged mediastinal or hilar lymph nodes. Esophagus is unremarkable in appearance. No axillary lymphadenopathy. Lungs/Pleura: No suspicious pulmonary nodules or masses are noted. No acute consolidative airspace disease. No pleural effusions. Mild linear scarring in the lingula. Musculoskeletal: Postoperative changes of lumpectomy are noted in the right breast. In the inferior aspect of the right breast there is a 4.1 x 2.8 cm low-attenuation area, which is presumably a post lumpectomy seroma. Surrounding architectural distortion and fat stranding. Skin thickening in  the right breast. There are no aggressive appearing lytic or blastic lesions noted in the visualized portions of the skeleton. CT ABDOMEN PELVIS FINDINGS Hepatobiliary: No suspicious cystic or solid hepatic lesions. No intra or extrahepatic biliary ductal dilatation. Gallbladder is normal in appearance. Pancreas: No pancreatic mass. No pancreatic ductal dilatation. No pancreatic or peripancreatic fluid collections or inflammatory changes. Spleen: Unremarkable. Adrenals/Urinary Tract: Bilateral kidneys and adrenal glands are normal in appearance. No hydroureteronephrosis. Urinary bladder is normal in appearance. Stomach/Bowel: Normal appearance of the stomach. No pathologic dilatation of small bowel or colon. Suture line near the rectosigmoid junction, presumably from prior partial colectomy. No unexpected soft tissue mass at the level of the suture line to suggest local recurrence of disease. The appendix is not confidently identified and may be surgically absent. Regardless, there are no inflammatory changes noted adjacent to the cecum to suggest the presence of an acute appendicitis at this time. Vascular/Lymphatic: Aortic atherosclerosis, without evidence of aneurysm or dissection in the abdominal or pelvic vasculature. No lymphadenopathy noted in the  abdomen or pelvis. Reproductive: Status post hysterectomy. Ovaries are not confidently identified may be surgically absent or atrophic. Other: No significant volume of ascites.  No pneumoperitoneum. Musculoskeletal: There are no aggressive appearing lytic or blastic lesions noted in the visualized portions of the skeleton. IMPRESSION: 1. Status post partial colectomy at the rectosigmoid junction, with no findings to suggest local recurrence of disease or definite metastatic disease in the chest, abdomen or pelvis. 2. New postoperative changes of lumpectomy in the right breast with a low-attenuation collection in the inferior aspect of the right breast which presumably reflects a postoperative seroma. Correlation with recent 01/22/2020 is recommended. Breast MRI 3. Aortic atherosclerosis, in addition to left main and 2 vessel coronary artery disease. Assessment for potential risk factor modification, dietary therapy or pharmacologic therapy may be warranted, if clinically indicated. 4. Additional incidental findings, as above. Electronically Signed   By: Vinnie Langton M.D.   On: 01/27/2020 09:36   CT Abdomen Pelvis W Contrast  Result Date: 01/27/2020 CLINICAL DATA:  79 year old female with history of colorectal cancer. Follow-up study. Additional history of lumpectomy in the right breast. EXAM: CT CHEST, ABDOMEN, AND PELVIS WITH CONTRAST TECHNIQUE: Multidetector CT imaging of the chest, abdomen and pelvis was performed following the standard protocol during bolus administration of intravenous contrast. CONTRAST:  130m OMNIPAQUE IOHEXOL 300 MG/ML  SOLN COMPARISON:  None. FINDINGS: CT CHEST FINDINGS Cardiovascular: Heart size is normal. There is no significant pericardial fluid, thickening or pericardial calcification. There is aortic atherosclerosis, as well as atherosclerosis of the great vessels of the mediastinum and the coronary arteries, including calcified atherosclerotic plaque in the left main, left  anterior descending and right coronary arteries. Right subclavian single-lumen porta cath with tip terminating in the distal superior vena cava. Mediastinum/Nodes: No pathologically enlarged mediastinal or hilar lymph nodes. Esophagus is unremarkable in appearance. No axillary lymphadenopathy. Lungs/Pleura: No suspicious pulmonary nodules or masses are noted. No acute consolidative airspace disease. No pleural effusions. Mild linear scarring in the lingula. Musculoskeletal: Postoperative changes of lumpectomy are noted in the right breast. In the inferior aspect of the right breast there is a 4.1 x 2.8 cm low-attenuation area, which is presumably a post lumpectomy seroma. Surrounding architectural distortion and fat stranding. Skin thickening in the right breast. There are no aggressive appearing lytic or blastic lesions noted in the visualized portions of the skeleton. CT ABDOMEN PELVIS FINDINGS Hepatobiliary: No suspicious cystic or solid hepatic lesions. No intra or  extrahepatic biliary ductal dilatation. Gallbladder is normal in appearance. Pancreas: No pancreatic mass. No pancreatic ductal dilatation. No pancreatic or peripancreatic fluid collections or inflammatory changes. Spleen: Unremarkable. Adrenals/Urinary Tract: Bilateral kidneys and adrenal glands are normal in appearance. No hydroureteronephrosis. Urinary bladder is normal in appearance. Stomach/Bowel: Normal appearance of the stomach. No pathologic dilatation of small bowel or colon. Suture line near the rectosigmoid junction, presumably from prior partial colectomy. No unexpected soft tissue mass at the level of the suture line to suggest local recurrence of disease. The appendix is not confidently identified and may be surgically absent. Regardless, there are no inflammatory changes noted adjacent to the cecum to suggest the presence of an acute appendicitis at this time. Vascular/Lymphatic: Aortic atherosclerosis, without evidence of aneurysm or  dissection in the abdominal or pelvic vasculature. No lymphadenopathy noted in the abdomen or pelvis. Reproductive: Status post hysterectomy. Ovaries are not confidently identified may be surgically absent or atrophic. Other: No significant volume of ascites.  No pneumoperitoneum. Musculoskeletal: There are no aggressive appearing lytic or blastic lesions noted in the visualized portions of the skeleton. IMPRESSION: 1. Status post partial colectomy at the rectosigmoid junction, with no findings to suggest local recurrence of disease or definite metastatic disease in the chest, abdomen or pelvis. 2. New postoperative changes of lumpectomy in the right breast with a low-attenuation collection in the inferior aspect of the right breast which presumably reflects a postoperative seroma. Correlation with recent 01/22/2020 is recommended. Breast MRI 3. Aortic atherosclerosis, in addition to left main and 2 vessel coronary artery disease. Assessment for potential risk factor modification, dietary therapy or pharmacologic therapy may be warranted, if clinically indicated. 4. Additional incidental findings, as above. Electronically Signed   By: Vinnie Langton M.D.   On: 01/27/2020 09:36   MR Breast Bilateral W Wo Contrast  Result Date: 01/22/2020 CLINICAL DATA:  79 year old female status post right lumpectomy for invasive mammary carcinoma performed in February 2021. LABS:  None performed on site. EXAM: BILATERAL BREAST MRI WITH AND WITHOUT CONTRAST TECHNIQUE: Multiplanar, multisequence MR images of both breasts were obtained prior to and following the intravenous administration of 10 ml of Gadavist. Three-dimensional MR images were rendered by post-processing of the original MR data on an independent workstation. The three-dimensional MR images were interpreted, and findings are reported in the following complete MRI report for this study. Three dimensional images were evaluated at the independent Dyna[employer]ad workstation  COMPARISON:  Previous exam(s). FINDINGS: Breast composition: b. Scattered fibroglandular tissue. Background parenchymal enhancement: Moderate. Right breast: A postoperative seroma is noted in the lower outer right breast extending from mid to far posterior depth. Diffuse non-mass enhancement is seen surrounding the lumpectomy bed. Additionally, there is skin thickening and enhancement involving the entire inferior breast. The postsurgical changes abut the underlying pectoralis muscle, but no abnormal muscle enhancement is seen. No focally suspicious mass or enhancement is identified within the remainder of the right breast. Left breast: There is a 1 cm focus of linear enhancement in the central left breast at mid to posterior depth (series 10, image 51/128). No additional suspicious mass or enhancement identified. Lymph nodes: No abnormal appearing lymph nodes. Ancillary findings:  None. IMPRESSION: 1. Postoperative seroma with surrounding diffuse non-mass enhancement in the lower outer right breast extending from mid to far posterior depth. Additionally, there is skin thickening and enhancement involving the entire inferior right breast. These findings may be postsurgical/inflammatory in nature, but limit evaluation of subtle findings in the region. Lymphovascular spread of disease  cannot be excluded in this setting. Otherwise, no focally suspicious mass is identified along the margins of the seroma or in the remainder of the right breast. 2. Indeterminate 1 cm focus of linear enhancement in the central left breast (series 10, image 51). This area is unlikely to be seen with ultrasound. Recommendation is to proceed directly with MRI guided biopsy. 3. No suspicious lymphadenopathy. RECOMMENDATION: 1. MRI guided biopsy of the left breast. 2. Per clinical treatment plan on the right. BI-RADS CATEGORY  4: Suspicious. Electronically Signed   By: Kristopher Oppenheim M.D.   On: 01/22/2020 13:35   NM SENTINEL NODE  INJECTION  Result Date: 01/03/2020 CLINICAL DATA:  Right breast cancer. EXAM: NUCLEAR MEDICINE BREAST LYMPHOSCINTIGRAPHY RIGHT BREAST TECHNIQUE: Intradermal injection of radiopharmaceutical was performed at the 12 o'clock, 3 o'clock, 6 o'clock, and 9 o'clock positions around the right nipple. The patient was then sent to the operating room where the sentinel node(s) were identified and removed by the surgeon. RADIOPHARMACEUTICALS:  Total of 0.7 mCi Millipore-filtered Technetium-56msulfur colloid, injected in four aliquots of 0.25 mCi each. IMPRESSION: Uncomplicated intradermal injection of a total of 0.7 mCi Technetium-966mulfur colloid for purposes of sentinel node identification. Electronically Signed   By: ThMarcello MooresRegister   On: 01/03/2020 09:37   MM Breast Surgical Specimen  Result Date: 01/03/2020 CLINICAL DATA:  Status post wire localized RIGHT lumpectomy. EXAM: SPECIMEN RADIOGRAPH OF THE RIGHT BREAST COMPARISON:  Previous exam(s). FINDINGS: Status post excision of the right breast. The wire tip and coil shaped biopsy marker clip are present and are marked for pathology. Findings are called to the operating room at the time of interpretation. IMPRESSION: Specimen radiograph of the right breast. Electronically Signed   By: ElNolon Nations.D.   On: 01/03/2020 14:54   MM DIAG BREAST TOMO UNI RIGHT  Result Date: 01/03/2020 CLINICAL DATA:  Status post ultrasound-guided localization of mass in the 7 o'clock location of the RIGHT breast. EXAM: DIAGNOSTIC RIGHT MAMMOGRAM POST ULTRASOUND-GUIDED WIRE LOCALIZATION COMPARISON:  Previous exam(s). FINDINGS: Mammographic images were obtained following ultrasound-guided wire localization. These demonstrate the wire within the lesion marked with a coil shaped clip in the posterior central portion of the RIGHT breast. IMPRESSION: Appropriate location of the localizing wire. Final Assessment: Post Procedure Mammograms for Seed Placement Electronically Signed   By:  ElNolon Nations.D.   On: 01/03/2020 09:55   MM CLIP PLACEMENT LEFT  Result Date: 01/31/2020 CLINICAL DATA:  Patient is post MRI guided core needle biopsy of a 1 cm focus of linear non mass enhancement over the middle to posterior third of the central left breast. History of recent right malignant lumpectomy February 2021. EXAM: DIAGNOSTIC LEFT MAMMOGRAM POST MRI BIOPSY COMPARISON:  Previous exam(s). FINDINGS: Mammographic images were obtained following left guided biopsy of MRI. The biopsy marking clip is approximately 3.5 cm medial to the mid aspect of the biopsy site on the CC image and approximately 1.5 cm inferior to the biopsy site on the ML image. IMPRESSION: Dumbbell shaped biopsy marking clip as described with migration 3.5 cm medial to the biopsy site on the CC image and approximately 1.5 cm inferior to the biopsy site on the ML image. Final Assessment: Post Procedure Mammograms for Marker Placement Electronically Signed   By: DaMarin Olp.D.   On: 01/31/2020 10:23   MM CLIP PLACEMENT RIGHT  Result Date: 12/04/2019 CLINICAL DATA:  Status post ultrasound-guided core biopsy of a right breast mass. EXAM: DIAGNOSTIC RIGHT MAMMOGRAM POST ULTRASOUND  BIOPSY COMPARISON:  Previous exam(s). FINDINGS: Mammographic images were obtained following ultrasound guided biopsy of a mass in the 7 o'clock region of the right breast. The biopsy marking clip is in the expected location at the posterior margin of the mass in the 7 o'clock region of the breast. IMPRESSION: Appropriate positioning of the coil shaped biopsy marking clip at the site of biopsy in the 7 o'clock region of the right breast. Final Assessment: Post Procedure Mammograms for Marker Placement Electronically Signed   By: Lillia Mountain M.D.   On: 12/04/2019 09:34   MR LT BREAST BX W LOC DEV 1ST LESION IMAGE BX SPEC MR GUIDE  Addendum Date: 02/03/2020   ADDENDUM REPORT: 02/03/2020 11:08 ADDENDUM: Pathology revealed FIBROCYSTIC CHANGE of the LEFT  breast, mid-post 1/3 central. This was found to be concordant by Dr. Marin Olp. Pathology results were discussed with the patient by telephone. The patient reported doing well after the biopsy with tenderness at the site. Post biopsy instructions and care were reviewed and questions were answered. The patient was encouraged to call [facility] for any additional concerns. The patient has a diagnosis of RIGHT breast cancer and should follow her outlined treatment plan. The patient was instructed to return for a breast MRI in 6 months, per protocol. Pathology results reported by Terie Purser, RN on 02/03/2020. Electronically Signed   By: Marin Olp M.D.   On: 02/03/2020 11:08   Result Date: 02/03/2020 CLINICAL DATA:  Patient with recent malignant right lumpectomy February 2021 presents for MRI guided core needle biopsy of a 1 cm focus of linear non mass enhancement over the middle to posterior third of the central left breast. EXAM: MRI GUIDED CORE NEEDLE BIOPSY OF THE LEFT BREAST TECHNIQUE: Multiplanar, multisequence MR imaging of the left breast was performed both before and after administration of intravenous contrast. CONTRAST:  41m GADAVIST GADOBUTROL 1 MMOL/ML IV SOLN COMPARISON:  Previous exams. FINDINGS: I met with the patient, and we discussed the procedure of MRI guided biopsy, including risks, benefits, and alternatives. Specifically, we discussed the risks of infection, bleeding, tissue injury, clip migration, and inadequate sampling. Informed, written consent was given. The usual time out protocol was performed immediately prior to the procedure. Using sterile technique, 1% Lidocaine, MRI guidance, and a 9 gauge vacuum assisted device, biopsy was performed of the targeted mass over the central left breast using a lateral to medial approach. At the conclusion of the procedure, a dumbbell-shaped tissue marker clip was deployed into the biopsy cavity. Follow-up 2-view  mammogram was performed and dictated separately. IMPRESSION: MRI guided biopsy of an indeterminate 1 cm focus of linear non mass enhancement over the central left breast. No apparent complications. Electronically Signed: By: DMarin OlpM.D. On: 01/31/2020 10:10   [place] BREAST BX W LOC DEV 1ST LESION IMG BX SPEC [place]  Addendum Date: 12/09/2019   ADDENDUM REPORT: 12/09/2019 10:39 ADDENDUM: PATHOLOGY revealed: A. BREAST, RIGHT AT 7:00, 3 CM FROM THE NIPPLE; ULTRASOUND-GUIDED CORE BIOPSY: - INVASIVE MAMMARY CARCINOMA, NO SPECIAL TYPE. 7 mm in this sample. Grade 3. Ductal carcinoma in situ: Present, intermediate grade. Lymphovascular invasion: Not identified. Pathology results are CONCORDANT with imaging findings, per Dr. DLillia Mountain Pathology results and recommendations below were discussed with patient by telephone on 12/09/2019. Patient reported biopsy site doing well with slight tenderness at the site. Post biopsy care instructions were reviewed and questions were answered. [facility] related to the biopsy. Recommendation: Surgical referral. Request for surgical referral was relayed to nurse navigators at [facility] by Electa Sniff RN on 12/09/2019. Addendum by Electa Sniff RN on 12/09/2019. Electronically Signed   By: Lillia Mountain M.D.   On: 12/09/2019 10:39   Result Date: 12/09/2019 CLINICAL DATA:  Right breast mass. EXAM: ULTRASOUND GUIDED RIGHT BREAST CORE NEEDLE BIOPSY COMPARISON:  Previous exam(s). FINDINGS: I met with the patient and we discussed the procedure of ultrasound-guided biopsy, including benefits and alternatives. We discussed the high likelihood of a successful procedure. We discussed the risks of the procedure, including infection, bleeding, tissue injury, clip migration, and inadequate sampling. Informed written consent was given. The usual time-out protocol was performed immediately prior to  the procedure. Lesion quadrant: Lower outer quadrant Using sterile technique and 1% Lidocaine as local anesthetic, under direct ultrasound visualization, a 14 gauge spring-loaded device was used to perform biopsy of a mass in the 7 o'clock region of the right breast using a lateral to medial approach. At the conclusion of the procedure coil shaped tissue marker clip was deployed into the biopsy cavity. Follow up 2 view mammogram was performed and dictated separately. IMPRESSION: Ultrasound guided biopsy of the right breast. No apparent complications. Electronically Signed: By: Lillia Mountain M.D. On: 12/04/2019 09:19   [place] RT PLC BREAST LOC DEV   1ST LESION  INC US GUIDE  Result Date: 01/03/2020 CLINICAL DATA:  Patient presents for needle localization prior to lumpectomy for invasive mammary carcinoma of the RIGHT breast. EXAM: NEEDLE LOCALIZATION OF THE RIGHT BREAST WITH ULTRASOUND GUIDANCE COMPARISON:  Previous exams. FINDINGS: Patient presents for needle localization prior to lumpectomy. I met with the patient and we discussed the procedure of needle localization including benefits and alternatives. We discussed the high likelihood of a successful procedure. We discussed the risks of the procedure, including infection, bleeding, tissue injury, and further surgery. Informed, written consent was given. The usual time-out protocol was performed immediately prior to the procedure. Using ultrasound guidance, sterile technique, 1% lidocaine and a 7 centimeter modified Kopans needle, the mass in the 7 o'clock location of the RIGHT breast localized using LATERAL to MEDIAL approach. The images were marked for Dr. Celine Ahr. IMPRESSION: Needle localization of the RIGHT breast. No apparent complications. Electronically Signed   By: Nolon Nations M.D.   On: 01/03/2020 09:38     ASSESSMENT & PLAN:  79 y.o.female who has Stage IIIB colon cancer.,  History of DVT, presented for evaluation for newly diagnosed breast  cancer. Cancer Staging Cancer of descending colon [facility]) Staging form: Colon and Rectum, AJCC 8th Edition - Pathologic: Stage IIIB (pT4a, pN1, cM0) - Signed by Earlie Server, MD on 04/30/2018  Malignant neoplasm of right breast in female, estrogen receptor positive ([facility]) Staging form: Breast, AJCC 8th Edition - Clinical: No stage assigned - Unsigned - Pathologic: Stage IA (pT1c, pN0, cM0, G3, ER+, PR+, HER2-, Oncotype DX score: 8) - Signed by Earlie Server, MD on 01/30/2020  1. Malignant neoplasm of right breast in female, estrogen receptor positive, unspecified site of breast ([place])     #Stage IA, right Breast cancer, pT1cN0 ER/PR positive, HER-2 negative. Now status post mastectomy with no residual tumor identified. Pathology was discussed with patient. Oncotype DX score is low at 8, no adjuvant chemotherapy will be offered. No radiation given that she has had mastectomy. Rationale of using aromatase inhibitor -Arimidex  discussed with patient.  Side effects of Arimidex including  but not limited to hot flush, joint pain, fatigue, mood swing, osteoporosis, pathological fractures, increase of cardiovascular events, etc. discussed with patient. Patient voices understanding and willing to proceed.  Patient reports to have a history of osteopenia.  Last bone density scan was done in 2016.  I recommend to repeat a bone density scan as a baseline prior to aromatase inhibitor use. Osteopenia, we will further discuss about bisphosphonate use at the next visit.  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  I do not see Xarelto on her medication list.  I called patient this afternoon for clarification.  Not able to reach her.  Will advise nurse to do follow-up next week.  # History of colon cancer - diagnosed 06/07/2017, finished all treatment April 2019.  Surveillance CT scan every 6 months for the first 2 years.  Next CT due in September 2021   Orders Placed This Encounter   Procedures  . DG Bone Density    Standing Status:   Future    Standing Expiration Date:   02/27/2021    Order Specific Question:   Reason for Exam (SYMPTOM  OR DIAGNOSIS REQUIRED)    Answer:   right breast cancer    Order Specific Question:   Preferred imaging location?    Answer:   [facility]  . CBC with Differential/Platelet    Standing Status:   Future    Standing Expiration Date:   04/05/2021  . Comprehensive metabolic panel    Standing Status:   Future    Standing Expiration Date:   04/05/2021    Return of visit:  5 weeks.   Earlie Server, MD, PhD Hematology Oncology [facility] Pager- [phone] 02/28/2020

## 2020-02-28 NOTE — Progress Notes (Signed)
Pt in for follow up, saw surgeon yesterday.

## 2020-02-28 NOTE — Telephone Encounter (Signed)
Xarelto is not on patient's current medication list.  Dr. Tasia Catchings would like to know if she is taking.  If not, who discontinued.

## 2020-03-02 NOTE — Telephone Encounter (Signed)
Patient returned [name] call from Friday regarding Xarelto. She states Dr Vidal Schwalbe her PCP discontinued it quite a while ago (could not give exact date)

## 2020-03-02 NOTE — Telephone Encounter (Signed)
FYI

## 2020-03-04 ENCOUNTER — Telehealth: Payer: Self-pay | Admitting: *Deleted

## 2020-03-04 NOTE — Telephone Encounter (Signed)
We are able to se in Epic that she is being followed by Dr. Celine Ahr.

## 2020-03-04 NOTE — Telephone Encounter (Signed)
Patient requesting that Dr Fredirick Maudlin be added to her Care Team list.  I tries to add her, but it does not let me save it

## 2020-03-05 ENCOUNTER — Encounter: Payer: Self-pay | Admitting: *Deleted

## 2020-03-05 NOTE — Progress Notes (Signed)
Called patient to follow up post surgery.  Patient does not need radiation therapy.  States she is doing well.  No needs at this time.

## 2020-03-11 ENCOUNTER — Ambulatory Visit
Admission: RE | Admit: 2020-03-11 | Discharge: 2020-03-11 | Disposition: A | Discharge status: Home / Self Care | Payer: PPO | Source: Ambulatory Visit | Attending: Oncology | Admitting: Oncology

## 2020-03-11 DIAGNOSIS — C50911 Malignant neoplasm of unspecified site of right female breast: Secondary | ICD-10-CM

## 2020-03-11 DIAGNOSIS — Z780 Asymptomatic menopausal state: Secondary | ICD-10-CM | POA: Diagnosis not present

## 2020-03-11 DIAGNOSIS — Z170 Estrogen receptor positive status [ER+]: Secondary | ICD-10-CM | POA: Insufficient documentation

## 2020-03-11 DIAGNOSIS — M85851 Other specified disorders of bone density and structure, right thigh: Secondary | ICD-10-CM | POA: Insufficient documentation

## 2020-03-16 ENCOUNTER — Telehealth: Payer: Self-pay | Admitting: *Deleted

## 2020-03-16 NOTE — Telephone Encounter (Signed)
Spoke with patient and all questions were answered. Denies, Fevers, chills, drainage, redness, odor to the area. Patient was pleased with a return phone call. Patient advised to the call the clinic if any other questions arise. Pt voiced understanding.

## 2020-03-16 NOTE — Telephone Encounter (Signed)
Patient called in regards to her surgery that was performed on 02/14/20 by Dr Celine Ahr, she had a right mastectomy. She stated that the right side where the surgery was done has been numb every since. She wants to know if this is normal. Please call and advise

## 2020-03-29 ENCOUNTER — Other Ambulatory Visit: Payer: Self-pay | Admitting: Oncology

## 2020-04-02 ENCOUNTER — Other Ambulatory Visit: Payer: Self-pay

## 2020-04-02 ENCOUNTER — Encounter: Payer: Self-pay | Admitting: Oncology

## 2020-04-02 ENCOUNTER — Inpatient Hospital Stay (HOSPITAL_BASED_OUTPATIENT_CLINIC_OR_DEPARTMENT_OTHER): Payer: PPO | Admitting: Oncology

## 2020-04-02 ENCOUNTER — Inpatient Hospital Stay: Payer: PPO | Attending: Oncology

## 2020-04-02 VITALS — BP 125/70 | HR 67 | Temp 98.0°F | Resp 20 | Wt 227.0 lb

## 2020-04-02 DIAGNOSIS — Z85038 Personal history of other malignant neoplasm of large intestine: Secondary | ICD-10-CM | POA: Insufficient documentation

## 2020-04-02 DIAGNOSIS — C50911 Malignant neoplasm of unspecified site of right female breast: Secondary | ICD-10-CM

## 2020-04-02 DIAGNOSIS — Z9011 Acquired absence of right breast and nipple: Secondary | ICD-10-CM | POA: Diagnosis not present

## 2020-04-02 DIAGNOSIS — Z170 Estrogen receptor positive status [ER+]: Secondary | ICD-10-CM

## 2020-04-02 DIAGNOSIS — Z9049 Acquired absence of other specified parts of digestive tract: Secondary | ICD-10-CM | POA: Diagnosis not present

## 2020-04-02 DIAGNOSIS — I82441 Acute embolism and thrombosis of right tibial vein: Secondary | ICD-10-CM

## 2020-04-02 DIAGNOSIS — Z79811 Long term (current) use of aromatase inhibitors: Secondary | ICD-10-CM | POA: Insufficient documentation

## 2020-04-02 DIAGNOSIS — Z9221 Personal history of antineoplastic chemotherapy: Secondary | ICD-10-CM | POA: Insufficient documentation

## 2020-04-02 DIAGNOSIS — Z79899 Other long term (current) drug therapy: Secondary | ICD-10-CM | POA: Diagnosis not present

## 2020-04-02 DIAGNOSIS — Z923 Personal history of irradiation: Secondary | ICD-10-CM | POA: Diagnosis not present

## 2020-04-02 DIAGNOSIS — M8580 Other specified disorders of bone density and structure, unspecified site: Secondary | ICD-10-CM | POA: Insufficient documentation

## 2020-04-02 DIAGNOSIS — Z86718 Personal history of other venous thrombosis and embolism: Secondary | ICD-10-CM | POA: Insufficient documentation

## 2020-04-02 LAB — CBC WITH DIFFERENTIAL/PLATELET
Abs Immature Granulocytes: 0.01 10*3/uL (ref 0.00–0.07)
Basophils Absolute: 0 10*3/uL (ref 0.0–0.1)
Basophils Relative: 1 %
Eosinophils Absolute: 0.2 10*3/uL (ref 0.0–0.5)
Eosinophils Relative: 3 %
HCT: 39.2 % (ref 36.0–46.0)
Hemoglobin: 12.7 g/dL (ref 12.0–15.0)
Immature Granulocytes: 0 %
Lymphocytes Relative: 27 %
Lymphs Abs: 1.3 10*3/uL (ref 0.7–4.0)
MCH: 29 pg (ref 26.0–34.0)
MCHC: 32.4 g/dL (ref 30.0–36.0)
MCV: 89.5 fL (ref 80.0–100.0)
Monocytes Absolute: 0.4 10*3/uL (ref 0.1–1.0)
Monocytes Relative: 8 %
Neutro Abs: 2.9 10*3/uL (ref 1.7–7.7)
Neutrophils Relative %: 61 %
Platelets: 206 10*3/uL (ref 150–400)
RBC: 4.38 MIL/uL (ref 3.87–5.11)
RDW: 14.2 % (ref 11.5–15.5)
WBC: 4.9 10*3/uL (ref 4.0–10.5)
nRBC: 0 % (ref 0.0–0.2)

## 2020-04-02 LAB — COMPREHENSIVE METABOLIC PANEL
ALT: 17 U/L (ref 0–44)
AST: 22 U/L (ref 15–41)
Albumin: 3.8 g/dL (ref 3.5–5.0)
Alkaline Phosphatase: 100 U/L (ref 38–126)
Anion gap: 8 (ref 5–15)
BUN: 19 mg/dL (ref 8–23)
CO2: 25 mmol/L (ref 22–32)
Calcium: 9.4 mg/dL (ref 8.9–10.3)
Chloride: 109 mmol/L (ref 98–111)
Creatinine, Ser: 0.97 mg/dL (ref 0.44–1.00)
GFR calc Af Amer: >60 mL/min (ref >60)
GFR calc non Af Amer: 56 mL/min — ABNORMAL LOW (ref >60)
Glucose, Bld: 118 mg/dL — ABNORMAL HIGH (ref 70–99)
Potassium: 3.9 mmol/L (ref 3.5–5.1)
Sodium: 142 mmol/L (ref 135–145)
Total Bilirubin: 0.9 mg/dL (ref 0.3–1.2)
Total Protein: 7.1 g/dL (ref 6.5–8.1)

## 2020-04-02 MED ORDER — ANASTROZOLE 1 MG PO TABS: 1.0000 mg | ORAL_TABLET | Freq: Every day | ORAL | 1 refills | Status: DC

## 2020-04-02 NOTE — Progress Notes (Signed)
Patient denies any concerns today.  

## 2020-04-02 NOTE — Progress Notes (Signed)
Hematology/Oncology Follow Up visit. [place] [phone] [phone]  CONSULT NOTE Patient Care Team: Burnard Hawthorne, FNP as PCP - General (Family Medicine) Vladimir Crofts, MD (Neurology) Noreene Filbert, MD as Referring Physician (Radiation Oncology) Leighton Ruff, MD as Consulting Physician (General Surgery) Earlie Server, MD as Consulting Physician (Oncology) Jules Husbands, MD as Consulting Physician (General Surgery) Rico Junker, RN as Registered Nurse Theodore Demark, RN as Registered Nurse Fredirick Maudlin, MD as Consulting Physician (General Surgery)  PURPOSE OF CONSULTATION:  Follow up for blood clot, and stage IIIB colon cancer, Stage IA right breast cancer  HISTORY OF PRESENTING ILLNESS:  [name] 79 y.o.  female with PMH listed as below was referred to me for evaluation and management of colon cancer.  She had colonoscopy evaluation due to positive cologuard.  She was found to have a nonobstructing mass approximately 20 cm from the anal verge which biopsy proved to be adenocarcinoma. CT scan showed a large mass from the sigmoid and extends beyond the wall of the colon toward the left into the surrounding fat. Preoperational CEA was normal. Patient underwent robotic sigmoidectomy on 06/07/2017.   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 MLH1  loss of function, PMS2 loss of function. Declined option of clinical trial ATOMIC She has a history of endometrial cancer s/p hysterotomy and radiation. She has a family history of colon cancer and breast cancer.  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 Results have been explained to patient by genetic counselor.   # Thyroid nodule, incidental finding from CT surveillance for colon cancer. Discussed  with patient's primary care provider [name].  Patient to follow-up with PCP for further evaluation with ultrasound thyroid.   Oncology treatment S/p adjuvant chemotherapy finished February 2019 Adjuvant RT finished in April 2019.  05/22/2018 Colonoscopy surveillance by Dr.Wohl showed 10 mm polyp in ascending colon.  Removed.  Patent end-to-end colo-colonic anastomosis Pathology showed benign polyp.  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   # mammogram on 11/19/2019.  Right breast mass warrants further evaluation. Right diagnostic mammogram showed 6 x 5 x 7 mm lobular hypoechoic mass, 7:00, 3 cm from nipple.  No right axillary adenopathy ultrasound-guided core needle biopsy was obtained. Pathology showed invasive mammary carcinoma, no special type.  Grade 3, ER/PR strongly positive.  90%, HER-2 negative.  # 01/03/2020 underwent right lumpectomy with SLNB Invasive mammary carcinoma, grade 3, DCIS present, margin is uninvolved by invasive carcinoma, positive for DCIS, multifocal, superior.  pT1c pN0 (sn). #OncotypeDx recurrence score 8, absolute chemotherapy benefit <1% February 2021, patient had Mediport removed.  01/22/20 Bilateral breast MRI was obtain for further evaluation of extent of remaining DCIS.  Image showed seroma with diffuse non mass enhancement in the lower outer right breast extending from mid to posterior depth. Skin thickening. no focally suspicious mass is identified along the margins of the seroma or in the remainder of the right breast.Indeterminate 1cm focus of linear enhancement in the central left breast.  01/21/20 left breast biopsy was negative for malignancy.  # 02/14/2020 patient underwent right breast mastectomy.  No residual malignancy was discovered. Patient reports feeling well today.  Denies any concerns regarding her surgical sites, no erythema, soreness, discharge. 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  INTERVAL HISTORY 79 yo female with above history presents for follow up for history of colon cancer, diagnosed breast cancer and history of DVT. April 2021 Patient started on Arimidex and she reports tolerating well without any side effects.  No new complaints.     Review of Systems  Constitutional: Negative for appetite change, chills, fatigue and fever.  HENT:   Negative for hearing loss and voice change.   Eyes: Negative for eye problems.  Respiratory: Negative for chest tightness and cough.   Cardiovascular: Negative for chest pain and leg swelling.  Gastrointestinal: Negative for abdominal distention, abdominal pain, blood in stool and diarrhea.  Endocrine: Negative for hot flashes.  Genitourinary: Negative for difficulty urinating and frequency.   Musculoskeletal: Negative for arthralgias.  Skin: Negative for itching and rash.  Neurological: Negative for extremity weakness.  Hematological: Negative for adenopathy.  Psychiatric/Behavioral: Negative for confusion.   MEDICAL HISTORY:  Past Medical History:  Diagnosis Date  . Arthritis    bitateral knees and left foot,s/p cortisone injection in past  . Chronic constipation   . Colon cancer ([place]) 2018   pt is undergoing chemo 11-09-17  . Complication of anesthesia     slow to wake up   . Endometrial cancer ([place]) 2014   Total Hysterectomy and Rad tx's.   . Family history of cancer   . Genetic testing 09/26/2017    Multi-Cancer panel (83 genes) @ [facility] - No pathogenic mutations detected  . Hypertension   . IBS (irritable bowel syndrome)   . Macular degeneration of both eyes   . Menopause    age 14  . PONV (postoperative nausea and vomiting)   . Vitamin D deficiency 06/07/10    SURGICAL HISTORY: Past Surgical History:  Procedure Laterality Date  . BREAST BIOPSY Right 12/04/2019   [place] bx-INVASIVE MAMMARY CARCINOMA  . BREAST LUMPECTOMY Right 01/03/2020  . BREAST LUMPECTOMY WITH NEEDLE LOCALIZATION AND AXILLARY  SENTINEL LYMPH NODE BX Right 01/03/2020   Procedure: BREAST LUMPECTOMY WITH NEEDLE LOCALIZATION AND AXILLARY SENTINEL LYMPH NODE BX;  Surgeon: Fredirick Maudlin, MD;  Location: [facility];  Service: General;  Laterality: Right;  . BUNIONECTOMY    . CATARACT EXTRACTION W/PHACO Left 09/24/2018   Procedure: CATARACT EXTRACTION PHACO AND INTRAOCULAR LENS PLACEMENT ([place]) LEFT;  Surgeon: Eulogio Bear, MD;  Location: [place];  Service: Ophthalmology;  Laterality: Left;  . CATARACT EXTRACTION W/PHACO Right 10/15/2018   Procedure: CATARACT EXTRACTION PHACO AND INTRAOCULAR LENS PLACEMENT ([place]) RIGHT;  Surgeon: Eulogio Bear, MD;  Location: [place];  Service: Ophthalmology;  Laterality: Right;  . cataract surgery     both eyes  . COLONOSCOPY WITH PROPOFOL N/A 04/04/2017   Procedure: COLONOSCOPY WITH PROPOFOL;  Surgeon: [name], MD;  Location: [facility] ENDOSCOPY;  Service: Endoscopy;  Laterality: N/A;  . COLONOSCOPY WITH PROPOFOL N/A 05/22/2018   Procedure: COLONOSCOPY WITH PROPOFOL;  Surgeon: [name], MD;  Location: [facility] ENDOSCOPY;  Service: Endoscopy;  Laterality: N/A;  . DILATION AND CURETTAGE OF UTERUS  1971  . FOOT SURGERY    . NASAL SINUS SURGERY    . PORT-A-CATH REMOVAL Right 02/14/2020   Procedure: REMOVAL PORT-A-CATH;  Surgeon: Fredirick Maudlin, MD;  Location: [facility];  Service: General;  Laterality: Right;  . PORTACATH PLACEMENT Right 07/12/2017   Procedure: INSERTION PORT-A-CATH;  Surgeon: Jules Husbands, MD;  Location: [facility];  Service: General;  Laterality: Right;  . ROBOTIC ASSISTED LAPAROSCOPIC VENTRAL/INCISIONAL HERNIA REPAIR N/A 06/07/2017   Procedure: ROBOTIC INCISIONAL HERNIA  REPAIR;  Surgeon: Leighton Ruff, MD;  Location: [facility];  Service: General;  Laterality: N/A;  . SIMPLE MASTECTOMY WITH AXILLARY SENTINEL NODE BIOPSY Right 02/14/2020   Procedure: Right SIMPLE MASTECTOMY;  Surgeon: Fredirick Maudlin, MD;  Location: [facility];  Service: General;   Laterality: Right;  Marland Kitchen VAGINAL DELIVERY    . VAGINAL HYSTERECTOMY      SOCIAL HISTORY: Social History   Socioeconomic History  . Marital status: Divorced    Spouse name: Not on file  . Number of children: Not on file  . Years of education: Not on file  . Highest education level: Not on file  Occupational History  . Not on file  Tobacco Use  . Smoking status: Never Smoker  . Smokeless tobacco: Never Used  Substance and Sexual Activity  . Alcohol use: Yes    Comment: rarely - Holidays  . Drug use: No  . Sexual activity: Never  Other Topics Concern  . Not on file  Social History Narrative   Exercise- aerobic exercise at home 3-4x week.       Lives in [place]. No pets.   Social Determinants of Health   Financial Resource Strain: Low Risk   . Difficulty of Paying Living Expenses: Not hard at all  Food Insecurity: No Food Insecurity  . Worried About Charity fundraiser in the Last Year: Never true  . Ran Out of Food in the Last Year: Never true  Transportation Needs: No Transportation Needs  . Lack of Transportation (Medical): No  . Lack of Transportation (Non-Medical): No  Physical Activity:   . Days of Exercise per Week:   . Minutes of Exercise per Session:   Stress: No Stress Concern Present  . Feeling of Stress : Not at all  Social Connections: Unknown  . Frequency of Communication with Friends and Family: More than three times a week  . Frequency of Social Gatherings with Friends and Family: More than three times a week  . Attends Religious Services: 1 to 4 times per year  . Active Member of Clubs or Organizations: Yes  . Attends Archivist Meetings: 1 to 4 times per year  . Marital Status: Not on file  Intimate Partner Violence: Not At Risk  . Fear of Current or Ex-Partner: No  . Emotionally Abused: No  . Physically Abused: No  . Sexually Abused: No    FAMILY HISTORY: Family History  Problem Relation Age of Onset  . Heart disease Father 28        massive MI  . Diabetes Father   . Bipolar disorder Sister   . Osteoporosis Sister   . Breast cancer Sister 81       currently 48  . Cancer Sister        breast  . Bipolar disorder Maternal Aunt   . Breast cancer Maternal Aunt        age at dx unknown; deceased 61s  . Colon cancer Maternal Grandfather 39       deceased 7  . Cancer Maternal Grandfather        breast  . Osteoporosis Mother   . Colon cancer Mother        age at dx unknown; deceased 89  . Cancer Maternal Grandmother        unk. type; deceased 73  . Breast cancer Paternal Aunt        deceased 45    ALLERGIES:  is allergic to oxycodone and codeine.  MEDICATIONS:  Current Outpatient Medications  Medication Sig Dispense Refill  . anastrozole (ARIMIDEX) 1 MG tablet Take 1 tablet (1 mg total) by mouth daily. 90 tablet 1  . fluticasone (FLONASE) 50 MCG/ACT nasal spray Place 2 sprays into both nostrils daily. 16 g 2  . losartan (COZAAR) 50 MG tablet Take 1 tablet (50 mg total) by mouth daily. (Patient taking differently: Take 50 mg by mouth every morning. ) 90 tablet 3  . metoprolol succinate (TOPROL-XL) 100 MG 24 hr tablet TAKE 1 TABLET DAILY (Patient taking differently: Take 100 mg by mouth every evening. ) 90 tablet 3  . Multiple Vitamin (MULTIVITAMIN WITH MINERALS) TABS tablet Take 1 tablet by mouth daily.    . Multiple Vitamins-Minerals (ICAPS AREDS 2 PO) Take 1 capsule by mouth 2 (two) times daily.     . phenylephrine (SUDAFED PE) 10 MG TABS tablet Take 10 mg by mouth every 4 (four) hours as needed (for allergies/congestion.).     Marland Kitchen topiramate (TOPAMAX) 100 MG tablet Take by mouth 2 (two) times daily.      No current facility-administered medications for this visit.      Marland Kitchen  PHYSICAL EXAMINATION: ECOG PERFORMANCE STATUS: 0 - Asymptomatic Vitals:   04/02/20 0948  BP: 125/70  Pulse: 67  Resp: 20  Temp: 98 F (36.7 C)   Filed Weights   04/02/20 0948  Weight: 227 lb (103 kg)   Physical  Exam Constitutional:      General: She is not in acute distress. HENT:     Head: Normocephalic and atraumatic.  Eyes:     General: No scleral icterus. Cardiovascular:     Rate and Rhythm: Normal rate and regular rhythm.     Heart sounds: Normal heart sounds.  Pulmonary:     Effort: Pulmonary effort is normal. No respiratory distress.     Breath sounds: No wheezing.  Abdominal:     General: Bowel sounds are normal. There is no distension.     Palpations: Abdomen is soft.  Musculoskeletal:        General: No deformity. Normal range of motion.     Cervical back: Normal range of motion and neck supple.  Skin:    General: Skin is warm and dry.     Findings: No erythema or rash.  Neurological:     Mental Status: She is alert and oriented to person, place, and time. Mental status is at baseline.     Cranial Nerves: No cranial nerve deficit.     Coordination: Coordination normal.  Psychiatric:        Mood and Affect: Mood normal.   .    LABORATORY DATA:  I have reviewed the data as listed Lab Results  Component Value Date   WBC 4.9 04/02/2020   HGB 12.7 04/02/2020   HCT 39.2 04/02/2020   MCV 89.5 04/02/2020   PLT 206 04/02/2020   Recent Labs    10/31/19 0920 01/22/20 1105 01/29/20 0914 02/14/20 1707 04/02/20 0930  NA 142  --  140  --  142  K 4.2  --  3.8  --  3.9  CL 112*  --  111  --  109  CO2 23  --  21*  --  25  GLUCOSE 123*  --  147*  --  118*  BUN 18  --  14  --  19  CREATININE 0.89   < > 1.03* 1.04* 0.97  CALCIUM 9.2  --  8.9  --  9.4  GFRNONAA >  60  --  52* 51* 56*  GFRAA >60  --  >60 60* >60  PROT 6.7  --  6.8  --  7.1  ALBUMIN 3.6  --  3.7  --  3.8  AST 22  --  22  --  22  ALT 17  --  15  --  17  ALKPHOS 104  --  98  --  100  BILITOT 0.7  --  0.6  --  0.9   < > = values in this interval not displayed.    RADIOGRAPHIC STUDIES: I have personally reviewed the radiological images as listed and agreed with the findings in the report. CT Chest W  Contrast  Result Date: 01/27/2020 CLINICAL DATA:  79 year old female with history of colorectal cancer. Follow-up study. Additional history of lumpectomy in the right breast. EXAM: CT CHEST, ABDOMEN, AND PELVIS WITH CONTRAST TECHNIQUE: Multidetector CT imaging of the chest, abdomen and pelvis was performed following the standard protocol during bolus administration of intravenous contrast. CONTRAST:  168m OMNIPAQUE IOHEXOL 300 MG/ML  SOLN COMPARISON:  None. FINDINGS: CT CHEST FINDINGS Cardiovascular: Heart size is normal. There is no significant pericardial fluid, thickening or pericardial calcification. There is aortic atherosclerosis, as well as atherosclerosis of the great vessels of the mediastinum and the coronary arteries, including calcified atherosclerotic plaque in the left main, left anterior descending and right coronary arteries. Right subclavian single-lumen porta cath with tip terminating in the distal superior vena cava. Mediastinum/Nodes: No pathologically enlarged mediastinal or hilar lymph nodes. Esophagus is unremarkable in appearance. No axillary lymphadenopathy. Lungs/Pleura: No suspicious pulmonary nodules or masses are noted. No acute consolidative airspace disease. No pleural effusions. Mild linear scarring in the lingula. Musculoskeletal: Postoperative changes of lumpectomy are noted in the right breast. In the inferior aspect of the right breast there is a 4.1 x 2.8 cm low-attenuation area, which is presumably a post lumpectomy seroma. Surrounding architectural distortion and fat stranding. Skin thickening in the right breast. There are no aggressive appearing lytic or blastic lesions noted in the visualized portions of the skeleton. CT ABDOMEN PELVIS FINDINGS Hepatobiliary: No suspicious cystic or solid hepatic lesions. No intra or extrahepatic biliary ductal dilatation. Gallbladder is normal in appearance. Pancreas: No pancreatic mass. No pancreatic ductal dilatation. No pancreatic or  peripancreatic fluid collections or inflammatory changes. Spleen: Unremarkable. Adrenals/Urinary Tract: Bilateral kidneys and adrenal glands are normal in appearance. No hydroureteronephrosis. Urinary bladder is normal in appearance. Stomach/Bowel: Normal appearance of the stomach. No pathologic dilatation of small bowel or colon. Suture line near the rectosigmoid junction, presumably from prior partial colectomy. No unexpected soft tissue mass at the level of the suture line to suggest local recurrence of disease. The appendix is not confidently identified and may be surgically absent. Regardless, there are no inflammatory changes noted adjacent to the cecum to suggest the presence of an acute appendicitis at this time. Vascular/Lymphatic: Aortic atherosclerosis, without evidence of aneurysm or dissection in the abdominal or pelvic vasculature. No lymphadenopathy noted in the abdomen or pelvis. Reproductive: Status post hysterectomy. Ovaries are not confidently identified may be surgically absent or atrophic. Other: No significant volume of ascites.  No pneumoperitoneum. Musculoskeletal: There are no aggressive appearing lytic or blastic lesions noted in the visualized portions of the skeleton. IMPRESSION: 1. Status post partial colectomy at the rectosigmoid junction, with no findings to suggest local recurrence of disease or definite metastatic disease in the chest, abdomen or pelvis. 2. New postoperative changes of lumpectomy in the right breast  with a low-attenuation collection in the inferior aspect of the right breast which presumably reflects a postoperative seroma. Correlation with recent 01/22/2020 is recommended. Breast MRI 3. Aortic atherosclerosis, in addition to left main and 2 vessel coronary artery disease. Assessment for potential risk factor modification, dietary therapy or pharmacologic therapy may be warranted, if clinically indicated. 4. Additional incidental findings, as above. Electronically  Signed   By: Vinnie Langton M.D.   On: 01/27/2020 09:36   CT Abdomen Pelvis W Contrast  Result Date: 01/27/2020 CLINICAL DATA:  79 year old female with history of colorectal cancer. Follow-up study. Additional history of lumpectomy in the right breast. EXAM: CT CHEST, ABDOMEN, AND PELVIS WITH CONTRAST TECHNIQUE: Multidetector CT imaging of the chest, abdomen and pelvis was performed following the standard protocol during bolus administration of intravenous contrast. CONTRAST:  19m OMNIPAQUE IOHEXOL 300 MG/ML  SOLN COMPARISON:  None. FINDINGS: CT CHEST FINDINGS Cardiovascular: Heart size is normal. There is no significant pericardial fluid, thickening or pericardial calcification. There is aortic atherosclerosis, as well as atherosclerosis of the great vessels of the mediastinum and the coronary arteries, including calcified atherosclerotic plaque in the left main, left anterior descending and right coronary arteries. Right subclavian single-lumen porta cath with tip terminating in the distal superior vena cava. Mediastinum/Nodes: No pathologically enlarged mediastinal or hilar lymph nodes. Esophagus is unremarkable in appearance. No axillary lymphadenopathy. Lungs/Pleura: No suspicious pulmonary nodules or masses are noted. No acute consolidative airspace disease. No pleural effusions. Mild linear scarring in the lingula. Musculoskeletal: Postoperative changes of lumpectomy are noted in the right breast. In the inferior aspect of the right breast there is a 4.1 x 2.8 cm low-attenuation area, which is presumably a post lumpectomy seroma. Surrounding architectural distortion and fat stranding. Skin thickening in the right breast. There are no aggressive appearing lytic or blastic lesions noted in the visualized portions of the skeleton. CT ABDOMEN PELVIS FINDINGS Hepatobiliary: No suspicious cystic or solid hepatic lesions. No intra or extrahepatic biliary ductal dilatation. Gallbladder is normal in  appearance. Pancreas: No pancreatic mass. No pancreatic ductal dilatation. No pancreatic or peripancreatic fluid collections or inflammatory changes. Spleen: Unremarkable. Adrenals/Urinary Tract: Bilateral kidneys and adrenal glands are normal in appearance. No hydroureteronephrosis. Urinary bladder is normal in appearance. Stomach/Bowel: Normal appearance of the stomach. No pathologic dilatation of small bowel or colon. Suture line near the rectosigmoid junction, presumably from prior partial colectomy. No unexpected soft tissue mass at the level of the suture line to suggest local recurrence of disease. The appendix is not confidently identified and may be surgically absent. Regardless, there are no inflammatory changes noted adjacent to the cecum to suggest the presence of an acute appendicitis at this time. Vascular/Lymphatic: Aortic atherosclerosis, without evidence of aneurysm or dissection in the abdominal or pelvic vasculature. No lymphadenopathy noted in the abdomen or pelvis. Reproductive: Status post hysterectomy. Ovaries are not confidently identified may be surgically absent or atrophic. Other: No significant volume of ascites.  No pneumoperitoneum. Musculoskeletal: There are no aggressive appearing lytic or blastic lesions noted in the visualized portions of the skeleton. IMPRESSION: 1. Status post partial colectomy at the rectosigmoid junction, with no findings to suggest local recurrence of disease or definite metastatic disease in the chest, abdomen or pelvis. 2. New postoperative changes of lumpectomy in the right breast with a low-attenuation collection in the inferior aspect of the right breast which presumably reflects a postoperative seroma. Correlation with recent 01/22/2020 is recommended. Breast MRI 3. Aortic atherosclerosis, in addition to left main and  2 vessel coronary artery disease. Assessment for potential risk factor modification, dietary therapy or pharmacologic therapy may be  warranted, if clinically indicated. 4. Additional incidental findings, as above. Electronically Signed   By: Vinnie Langton M.D.   On: 01/27/2020 09:36   MR Breast Bilateral W Wo Contrast  Result Date: 01/22/2020 CLINICAL DATA:  79 year old female status post right lumpectomy for invasive mammary carcinoma performed in February 2021. LABS:  None performed on site. EXAM: BILATERAL BREAST MRI WITH AND WITHOUT CONTRAST TECHNIQUE: Multiplanar, multisequence MR images of both breasts were obtained prior to and following the intravenous administration of 10 ml of Gadavist. Three-dimensional MR images were rendered by post-processing of the original MR data on an independent workstation. The three-dimensional MR images were interpreted, and findings are reported in the following complete MRI report for this study. Three dimensional images were evaluated at the independent Dyna[employer]ad workstation COMPARISON:  Previous exam(s). FINDINGS: Breast composition: b. Scattered fibroglandular tissue. Background parenchymal enhancement: Moderate. Right breast: A postoperative seroma is noted in the lower outer right breast extending from mid to far posterior depth. Diffuse non-mass enhancement is seen surrounding the lumpectomy bed. Additionally, there is skin thickening and enhancement involving the entire inferior breast. The postsurgical changes abut the underlying pectoralis muscle, but no abnormal muscle enhancement is seen. No focally suspicious mass or enhancement is identified within the remainder of the right breast. Left breast: There is a 1 cm focus of linear enhancement in the central left breast at mid to posterior depth (series 10, image 51/128). No additional suspicious mass or enhancement identified. Lymph nodes: No abnormal appearing lymph nodes. Ancillary findings:  None. IMPRESSION: 1. Postoperative seroma with surrounding diffuse non-mass enhancement in the lower outer right breast extending from mid to far  posterior depth. Additionally, there is skin thickening and enhancement involving the entire inferior right breast. These findings may be postsurgical/inflammatory in nature, but limit evaluation of subtle findings in the region. Lymphovascular spread of disease cannot be excluded in this setting. Otherwise, no focally suspicious mass is identified along the margins of the seroma or in the remainder of the right breast. 2. Indeterminate 1 cm focus of linear enhancement in the central left breast (series 10, image 51). This area is unlikely to be seen with ultrasound. Recommendation is to proceed directly with MRI guided biopsy. 3. No suspicious lymphadenopathy. RECOMMENDATION: 1. MRI guided biopsy of the left breast. 2. Per clinical treatment plan on the right. BI-RADS CATEGORY  4: Suspicious. Electronically Signed   By: Kristopher Oppenheim M.D.   On: 01/22/2020 13:35   DG Bone Density  Result Date: 03/11/2020 EXAM: DUAL X-RAY ABSORPTIOMETRY (DXA) FOR BONE MINERAL DENSITY IMPRESSION: Your patient Nyrah Demos completed a BMD test on 03/11/2020 using the [place] (software version: 14.10) manufactured by [employer]. The following summarizes the results of our evaluation. Technologist: SCE PATIENT BIOGRAPHICAL: Name: Shalece, Staffa Patient ID: [id number] Birth Date: 1941-05-02 Height: 65.0 in. Gender: Female Exam Date: 03/11/2020 Weight: 228.8 lbs. Indications: Advanced Age, Caucasian, Height Loss, High Risk Meds, History of Breast Cancer, History of colon cancer, Hysterectomy, Oophorectomy Bilateral, Osteoarthritis, Osteopenia, Postmenopausal, Previous Chemo and Radiation Fractures: Treatments: Anastrozole, calcium w/ vit D, Multi-Vitamin DENSITOMETRY RESULTS: Site      Region     Measured Date Measured Age WHO Classification Young Adult T-score BMD         %Change vs. Previous Significant Change (*) AP Spine L1-L3 3/11/[age] -0.6 1.108 g/cm2 - -  DualFemur Neck Right 3/11/[age]  [age] Osteopenia -1.9 0.774 g/cm2 - - ASSESSMENT: The BMD measured at Femur Neck Right is 0.774 g/cm2 with a T-score of -1.9. This patient is considered osteopenic according to [facility]) criteria. The scan quality is good. L4 was excluded due to degenerative changes. World Pharmacologist [facility]) criteria for post-menopausal, Caucasian Women: Normal:                   T-score at or above -1 SD Osteopenia/low bone mass: T-score between -1 and -2.5 SD Osteoporosis:             T-score at or below -2.5 SD RECOMMENDATIONS: 1. All patients should optimize calcium and vitamin D intake. 2. Consider FDA-approved medical therapies in postmenopausal women and men aged 57 years and older, based on the following: a. A hip or vertebral(clinical or morphometric) fracture b. T-score < -2.5 at the femoral neck or spine after appropriate evaluation to exclude secondary causes c. Low bone mass (T-score between -1.0 and -2.5 at the femoral neck or spine) and a 10-year probability of a hip fracture > 3% or a 10-year probability of a major osteoporosis-related fracture > 20% based on the [place]-adapted WHO algorithm 3. Clinician judgment and/or patient preferences may indicate treatment for people with 10-year fracture probabilities above or below these levels FOLLOW-UP: People with diagnosed cases of osteoporosis or at high risk for fracture should have regular bone mineral density tests. For patients eligible for Medicare, routine testing is allowed once every 2 years. The testing frequency can be increased to one year for patients who have rapidly progressing disease, those who are receiving or discontinuing medical therapy to restore bone mass, or have additional risk factors. I have reviewed this report, and agree with the above findings. [facility] Dear Dr Tasia Catchings, Your patient Larwance Sachs Finlay completed a FRAX assessment on 03/11/2020 using the [employer] (analysis version: 14.10)  manufactured by [employer]. The following summarizes the results of our evaluation. PATIENT BIOGRAPHICAL: Name: Shawnte, Winton Patient ID: [id number] Birth Date: 1941-08-23 Height:    65.0 in. Gender:     [age]       Weight:    228.8 lbs. Ethnicity:  White                            Exam Date: 03/11/2020 FRAX* RESULTS:  (version: 3.5) 10-year Probability of Fracture1 Major Osteoporotic Fracture2 Hip Fracture 13.3% 3.5% Population: [place] (Caucasian) Risk Factors: None Based on Femur (Right) Neck BMD 1 -The 10-year probability of fracture may be lower than reported if the patient has received treatment. 2 -Major Osteoporotic Fracture: Clinical Spine, Forearm, Hip or Shoulder *FRAX is a Materials engineer of the [employer] for Metabolic Bone Disease, a [place] (WHO) [facility]. ASSESSMENT: The probability of a major osteoporotic fracture is 13.3% within the next ten years. The probability of a hip fracture is 3.5% within the next ten years. . Electronically Signed   By: Rolm Baptise M.D.   On: 03/11/2020 15:15   MM CLIP PLACEMENT LEFT  Result Date: 01/31/2020 CLINICAL DATA:  Patient is post MRI guided core needle biopsy of a 1 cm focus of linear non mass enhancement over the middle to posterior third of the central left breast. History of recent right malignant lumpectomy February 2021. EXAM:  DIAGNOSTIC LEFT MAMMOGRAM POST MRI BIOPSY COMPARISON:  Previous exam(s). FINDINGS: Mammographic images were obtained following left guided biopsy of MRI. The biopsy marking clip is approximately 3.5 cm medial to the mid aspect of the biopsy site on the CC image and approximately 1.5 cm inferior to the biopsy site on the ML image. IMPRESSION: Dumbbell shaped biopsy marking clip as described with migration 3.5 cm medial to the biopsy site on the CC image and approximately 1.5 cm inferior to the biopsy site on the ML image. Final Assessment: Post  Procedure Mammograms for Marker Placement Electronically Signed   By: Marin Olp M.D.   On: 01/31/2020 10:23   MR LT BREAST BX W LOC DEV 1ST LESION IMAGE BX SPEC MR GUIDE  Addendum Date: 02/03/2020   ADDENDUM REPORT: 02/03/2020 11:08 ADDENDUM: Pathology revealed FIBROCYSTIC CHANGE of the LEFT breast, mid-post 1/3 central. This was found to be concordant by Dr. Marin Olp. Pathology results were discussed with the patient by telephone. The patient reported doing well after the biopsy with tenderness at the site. Post biopsy instructions and care were reviewed and questions were answered. The patient was encouraged to call [facility] for any additional concerns. The patient has a diagnosis of RIGHT breast cancer and should follow her outlined treatment plan. The patient was instructed to return for a breast MRI in 6 months, per protocol. Pathology results reported by Terie Purser, RN on 02/03/2020. Electronically Signed   By: Marin Olp M.D.   On: 02/03/2020 11:08   Result Date: 02/03/2020 CLINICAL DATA:  Patient with recent malignant right lumpectomy February 2021 presents for MRI guided core needle biopsy of a 1 cm focus of linear non mass enhancement over the middle to posterior third of the central left breast. EXAM: MRI GUIDED CORE NEEDLE BIOPSY OF THE LEFT BREAST TECHNIQUE: Multiplanar, multisequence MR imaging of the left breast was performed both before and after administration of intravenous contrast. CONTRAST:  38m GADAVIST GADOBUTROL 1 MMOL/ML IV SOLN COMPARISON:  Previous exams. FINDINGS: I met with the patient, and we discussed the procedure of MRI guided biopsy, including risks, benefits, and alternatives. Specifically, we discussed the risks of infection, bleeding, tissue injury, clip migration, and inadequate sampling. Informed, written consent was given. The usual time out protocol was performed immediately prior to the procedure. Using sterile technique, 1%  Lidocaine, MRI guidance, and a 9 gauge vacuum assisted device, biopsy was performed of the targeted mass over the central left breast using a lateral to medial approach. At the conclusion of the procedure, a dumbbell-shaped tissue marker clip was deployed into the biopsy cavity. Follow-up 2-view mammogram was performed and dictated separately. IMPRESSION: MRI guided biopsy of an indeterminate 1 cm focus of linear non mass enhancement over the central left breast. No apparent complications. Electronically Signed: By: DMarin OlpM.D. On: 01/31/2020 10:10     ASSESSMENT & PLAN:  79y.o.female who has Stage IIIB colon cancer.,  History of DVT, presented for evaluation for newly diagnosed breast cancer. Cancer Staging Cancer of descending colon ([facility] Staging form: Colon and Rectum, AJCC 8th Edition - Pathologic: Stage IIIB (pT4a, pN1, cM0) - Signed by YEarlie Server MD on 04/30/2018  Malignant neoplasm of right breast in female, estrogen receptor positive ([place] Staging form: Breast, AJCC 8th Edition - Clinical: No stage assigned - Unsigned - Pathologic: Stage IA (pT1c, pN0, cM0, G3, ER+, PR+, HER2-, Oncotype DX score: 8) - Signed by YEarlie Server MD on 01/30/2020  1.  Malignant neoplasm of right breast in female, estrogen receptor positive, unspecified site of breast ([place])   2. History of colon cancer   3. Deep vein thrombosis (DVT) of tibial vein of right lower extremity, unspecified chronicity (HCC)     #Stage IA, right Breast cancer, pT1cN0 ER/PR positive, HER-2 negative. S/p mastectomy with no residual tumor identified. Labs are reviewed and discussed with patient. Patient tolerates Arimidex 1 mg daily.  Continue.   # History of colon cancer - diagnosed 06/07/2017, finished all treatment April 2019.  Surveillance CT scan every 6 months for the first 2 years.  Next due September 2021, after that consider annual surveillance CT scan.  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  Patient  was previously on Xarelto 10 mg daily for prophylaxis.  She is currently off anticoagulation. -Unclear why this she stopped anticoagulation.  #Osteopenia, recommend patient to continue calcium and vitamin D supplementation. Discussed at length the risk factors [ethnic group, post-menopausal status, use of AI]; prevention/treatment strategies- including but not limited to exercise calcium and vitamin D . Recommend bisphosphonate/Prolia Discussed the potential side effects including but not limited to- hypocalcemia and osteoradionecrosis, GI side effects; discussed about need of dental clearance. She is not currently interested in proceeding with the treatment.   Orders Placed This Encounter  Procedures  . CBC with Differential/Platelet    Standing Status:   Future    Standing Expiration Date:   04/02/2021  . Comprehensive metabolic panel    Standing Status:   Future    Standing Expiration Date:   04/02/2021  . CEA    Standing Status:   Future    Standing Expiration Date:   04/02/2021    Return of visit: 3 months  Earlie Server, MD, PhD Hematology Oncology [facility] Pager- [phone] 04/02/2020

## 2020-04-03 ENCOUNTER — Telehealth: Payer: Self-pay | Admitting: Family

## 2020-04-03 NOTE — Telephone Encounter (Signed)
close

## 2020-05-29 DIAGNOSIS — G250 Essential tremor: Secondary | ICD-10-CM | POA: Diagnosis not present

## 2020-06-01 DIAGNOSIS — H353190 Nonexudative age-related macular degeneration, unspecified eye, stage unspecified: Secondary | ICD-10-CM | POA: Diagnosis not present

## 2020-07-10 ENCOUNTER — Inpatient Hospital Stay: Payer: PPO | Attending: Oncology

## 2020-07-10 ENCOUNTER — Inpatient Hospital Stay (HOSPITAL_BASED_OUTPATIENT_CLINIC_OR_DEPARTMENT_OTHER): Payer: PPO | Admitting: Oncology

## 2020-07-10 ENCOUNTER — Encounter: Payer: Self-pay | Admitting: Oncology

## 2020-07-10 ENCOUNTER — Other Ambulatory Visit: Payer: Self-pay

## 2020-07-10 VITALS — BP 144/71 | HR 56 | Temp 98.3°F | Resp 18 | Wt 227.9 lb

## 2020-07-10 DIAGNOSIS — Z86718 Personal history of other venous thrombosis and embolism: Secondary | ICD-10-CM | POA: Diagnosis not present

## 2020-07-10 DIAGNOSIS — Z79811 Long term (current) use of aromatase inhibitors: Secondary | ICD-10-CM | POA: Insufficient documentation

## 2020-07-10 DIAGNOSIS — M8580 Other specified disorders of bone density and structure, unspecified site: Secondary | ICD-10-CM | POA: Diagnosis not present

## 2020-07-10 DIAGNOSIS — Z9011 Acquired absence of right breast and nipple: Secondary | ICD-10-CM | POA: Diagnosis not present

## 2020-07-10 DIAGNOSIS — Z170 Estrogen receptor positive status [ER+]: Secondary | ICD-10-CM

## 2020-07-10 DIAGNOSIS — Z85038 Personal history of other malignant neoplasm of large intestine: Secondary | ICD-10-CM | POA: Insufficient documentation

## 2020-07-10 DIAGNOSIS — Z923 Personal history of irradiation: Secondary | ICD-10-CM | POA: Diagnosis not present

## 2020-07-10 DIAGNOSIS — C50911 Malignant neoplasm of unspecified site of right female breast: Secondary | ICD-10-CM | POA: Diagnosis not present

## 2020-07-10 DIAGNOSIS — Z9221 Personal history of antineoplastic chemotherapy: Secondary | ICD-10-CM | POA: Diagnosis not present

## 2020-07-10 DIAGNOSIS — K589 Irritable bowel syndrome without diarrhea: Secondary | ICD-10-CM | POA: Insufficient documentation

## 2020-07-10 DIAGNOSIS — I10 Essential (primary) hypertension: Secondary | ICD-10-CM | POA: Insufficient documentation

## 2020-07-10 DIAGNOSIS — Z7901 Long term (current) use of anticoagulants: Secondary | ICD-10-CM | POA: Diagnosis not present

## 2020-07-10 DIAGNOSIS — Z79899 Other long term (current) drug therapy: Secondary | ICD-10-CM | POA: Insufficient documentation

## 2020-07-10 LAB — CBC WITH DIFFERENTIAL/PLATELET
Abs Immature Granulocytes: 0.02 10*3/uL (ref 0.00–0.07)
Basophils Absolute: 0 10*3/uL (ref 0.0–0.1)
Basophils Relative: 1 %
Eosinophils Absolute: 0.1 10*3/uL (ref 0.0–0.5)
Eosinophils Relative: 2 %
HCT: 36.6 % (ref 36.0–46.0)
Hemoglobin: 12.3 g/dL (ref 12.0–15.0)
Immature Granulocytes: 0 %
Lymphocytes Relative: 14 %
Lymphs Abs: 0.7 10*3/uL (ref 0.7–4.0)
MCH: 29.9 pg (ref 26.0–34.0)
MCHC: 33.6 g/dL (ref 30.0–36.0)
MCV: 89.1 fL (ref 80.0–100.0)
Monocytes Absolute: 0.4 10*3/uL (ref 0.1–1.0)
Monocytes Relative: 7 %
Neutro Abs: 3.9 10*3/uL (ref 1.7–7.7)
Neutrophils Relative %: 76 %
Platelets: 202 10*3/uL (ref 150–400)
RBC: 4.11 MIL/uL (ref 3.87–5.11)
RDW: 14.9 % (ref 11.5–15.5)
WBC: 5.1 10*3/uL (ref 4.0–10.5)
nRBC: 0 % (ref 0.0–0.2)

## 2020-07-10 LAB — COMPREHENSIVE METABOLIC PANEL
ALT: 16 U/L (ref 0–44)
AST: 22 U/L (ref 15–41)
Albumin: 3.7 g/dL (ref 3.5–5.0)
Alkaline Phosphatase: 103 U/L (ref 38–126)
Anion gap: 8 (ref 5–15)
BUN: 21 mg/dL (ref 8–23)
CO2: 23 mmol/L (ref 22–32)
Calcium: 8.9 mg/dL (ref 8.9–10.3)
Chloride: 107 mmol/L (ref 98–111)
Creatinine, Ser: 0.95 mg/dL (ref 0.44–1.00)
GFR calc Af Amer: >60 mL/min (ref >60)
GFR calc non Af Amer: 57 mL/min — ABNORMAL LOW (ref >60)
Glucose, Bld: 108 mg/dL — ABNORMAL HIGH (ref 70–99)
Potassium: 4.2 mmol/L (ref 3.5–5.1)
Sodium: 138 mmol/L (ref 135–145)
Total Bilirubin: 0.5 mg/dL (ref 0.3–1.2)
Total Protein: 6.9 g/dL (ref 6.5–8.1)

## 2020-07-10 MED ORDER — RIVAROXABAN 10 MG PO TABS: 10.0000 mg | ORAL_TABLET | Freq: Every day | ORAL | 0 refills | Status: DC

## 2020-07-10 NOTE — Progress Notes (Signed)
Hematology/Oncology Follow Up visit. [place] [phone] [phone]  CONSULT NOTE Patient Care Team: Burnard Hawthorne, FNP as PCP - General (Family Medicine) Vladimir Crofts, MD (Neurology) Noreene Filbert, MD as Referring Physician (Radiation Oncology) Leighton Ruff, MD as Consulting Physician (General Surgery) Earlie Server, MD as Consulting Physician (Oncology) Jules Husbands, MD as Consulting Physician (General Surgery) Rico Junker, RN as Registered Nurse Theodore Demark, RN as Registered Nurse Fredirick Maudlin, MD as Consulting Physician (General Surgery)  PURPOSE OF CONSULTATION:  Follow up for blood clot, and stage IIIB colon cancer, Stage IA right breast cancer  HISTORY OF PRESENTING ILLNESS:  [name] 78 y.o.  female with PMH listed as below was referred to me for evaluation and management of colon cancer.  She had colonoscopy evaluation due to positive cologuard.  She was found to have a nonobstructing mass approximately 20 cm from the anal verge which biopsy proved to be adenocarcinoma. CT scan showed a large mass from the sigmoid and extends beyond the wall of the colon toward the left into the surrounding fat. Preoperational CEA was normal. Patient underwent robotic sigmoidectomy on 06/07/2017.   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 MLH1  loss of function, PMS2 loss of function. Declined option of clinical trial ATOMIC She has a history of endometrial cancer s/p hysterotomy and radiation. She has a family history of colon cancer and breast cancer.  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 Results have been explained to patient by genetic counselor.   # Thyroid nodule, incidental finding from CT surveillance for colon cancer. Discussed  with patient's primary care provider [name].  Patient to follow-up with PCP for further evaluation with ultrasound thyroid.   Oncology treatment S/p adjuvant chemotherapy finished February 2019 Adjuvant RT finished in April 2019.  05/22/2018 Colonoscopy surveillance by Dr.Wohl showed 10 mm polyp in ascending colon.  Removed.  Patent end-to-end colo-colonic anastomosis Pathology showed benign polyp.  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   # mammogram on 11/19/2019.  Right breast mass warrants further evaluation. Right diagnostic mammogram showed 6 x 5 x 7 mm lobular hypoechoic mass, 7:00, 3 cm from nipple.  No right axillary adenopathy ultrasound-guided core needle biopsy was obtained. Pathology showed invasive mammary carcinoma, no special type.  Grade 3, ER/PR strongly positive.  90%, HER-2 negative.  # 01/03/2020 underwent right lumpectomy with SLNB Invasive mammary carcinoma, grade 3, DCIS present, margin is uninvolved by invasive carcinoma, positive for DCIS, multifocal, superior.  pT1c pN0 (sn). #OncotypeDx recurrence score 8, absolute chemotherapy benefit <1% February 2021, patient had Mediport removed.  01/22/20 Bilateral breast MRI was obtain for further evaluation of extent of remaining DCIS.  Image showed seroma with diffuse non mass enhancement in the lower outer right breast extending from mid to posterior depth. Skin thickening. no focally suspicious mass is identified along the margins of the seroma or in the remainder of the right breast.Indeterminate 1cm focus of linear enhancement in the central left breast.  01/21/20 left breast biopsy was negative for malignancy.  # 02/14/2020 patient underwent right breast mastectomy.  No residual malignancy was discovered. Patient reports feeling well today.  Denies any concerns regarding her surgical sites, no erythema, soreness, discharge. 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    #  April 2021 Patient started on Arimidex and she reports tolerating well without any side effects. INTERVAL HISTORY 79 yo female with above history presents for follow up for history of colon cancer, diagnosed breast cancer and history of DVT. She is currently off anticoagulation as she thought her primary care provider discontinued medication. Have discussed with her primary care provider [name] was not aware about the discontinuation. No new complaints.  She tolerates Arimidex very well.  No leg swelling, , shortness of breath.    Review of Systems  Constitutional: Negative for appetite change, chills, fatigue and fever.  HENT:   Negative for hearing loss and voice change.   Eyes: Negative for eye problems.  Respiratory: Negative for chest tightness and cough.   Cardiovascular: Negative for chest pain and leg swelling.  Gastrointestinal: Negative for abdominal distention, abdominal pain, blood in stool and diarrhea.  Endocrine: Negative for hot flashes.  Genitourinary: Negative for difficulty urinating and frequency.   Musculoskeletal: Negative for arthralgias.  Skin: Negative for itching and rash.  Neurological: Negative for extremity weakness.  Hematological: Negative for adenopathy.  Psychiatric/Behavioral: Negative for confusion.   MEDICAL HISTORY:  Past Medical History:  Diagnosis Date  . Arthritis    bitateral knees and left foot,s/p cortisone injection in past  . Chronic constipation   . Colon cancer ([place]) 2018   pt is undergoing chemo 11-09-17  . Complication of anesthesia     slow to wake up   . Endometrial cancer ([place]) 2014   Total Hysterectomy and Rad tx's.   . Family history of cancer   . Genetic testing 09/26/2017    Multi-Cancer panel (83 genes) @ [facility] - No pathogenic mutations detected  . Hypertension   . IBS (irritable bowel syndrome)   . Macular degeneration of both eyes   . Menopause    age 42  . PONV (postoperative nausea and vomiting)   .  Vitamin D deficiency 06/07/10    SURGICAL HISTORY: Past Surgical History:  Procedure Laterality Date  . BREAST BIOPSY Right 12/04/2019   [place] bx-INVASIVE MAMMARY CARCINOMA  . BREAST LUMPECTOMY Right 01/03/2020  . BREAST LUMPECTOMY WITH NEEDLE LOCALIZATION AND AXILLARY SENTINEL LYMPH NODE BX Right 01/03/2020   Procedure: BREAST LUMPECTOMY WITH NEEDLE LOCALIZATION AND AXILLARY SENTINEL LYMPH NODE BX;  Surgeon: Fredirick Maudlin, MD;  Location: [facility];  Service: General;  Laterality: Right;  . BUNIONECTOMY    . CATARACT EXTRACTION W/PHACO Left 09/24/2018   Procedure: CATARACT EXTRACTION PHACO AND INTRAOCULAR LENS PLACEMENT ([place]) LEFT;  Surgeon: Eulogio Bear, MD;  Location: [place];  Service: Ophthalmology;  Laterality: Left;  . CATARACT EXTRACTION W/PHACO Right 10/15/2018   Procedure: CATARACT EXTRACTION PHACO AND INTRAOCULAR LENS PLACEMENT ([place]) RIGHT;  Surgeon: Eulogio Bear, MD;  Location: [place];  Service: Ophthalmology;  Laterality: Right;  . cataract surgery     both eyes  . COLONOSCOPY WITH PROPOFOL N/A 04/04/2017   Procedure: COLONOSCOPY WITH PROPOFOL;  Surgeon: [name], MD;  Location: [facility];  Service: Endoscopy;  Laterality: N/A;  . COLONOSCOPY WITH PROPOFOL N/A 05/22/2018   Procedure: COLONOSCOPY WITH PROPOFOL;  Surgeon: [name], MD;  Location[facility];  Service: Endoscopy;  Laterality: N/A;  . DILATION AND CURETTAGE OF UTERUS  1971  . FOOT SURGERY    . NASAL SINUS SURGERY    . PORT-A-CATH REMOVAL Right 02/14/2020   Procedure: REMOVAL PORT-A-CATH;  Surgeon: Fredirick Maudlin, MD;  Location: [facility];  Service: General;  Laterality: Right;  . PORTACATH  PLACEMENT Right 07/12/2017   Procedure: INSERTION PORT-A-CATH;  Surgeon: Jules Husbands, MD;  Location: [facility];  Service: General;  Laterality: Right;  . ROBOTIC ASSISTED LAPAROSCOPIC VENTRAL/INCISIONAL HERNIA REPAIR N/A 06/07/2017   Procedure: ROBOTIC INCISIONAL HERNIA REPAIR;   Surgeon: Leighton Ruff, MD;  Location: [facility];  Service: General;  Laterality: N/A;  . SIMPLE MASTECTOMY WITH AXILLARY SENTINEL NODE BIOPSY Right 02/14/2020   Procedure: Right SIMPLE MASTECTOMY;  Surgeon: Fredirick Maudlin, MD;  Location: [facility];  Service: General;  Laterality: Right;  Marland Kitchen VAGINAL DELIVERY    . VAGINAL HYSTERECTOMY      SOCIAL HISTORY: Social History   Socioeconomic History  . Marital status: Divorced    Spouse name: Not on file  . Number of children: Not on file  . Years of education: Not on file  . Highest education level: Not on file  Occupational History  . Not on file  Tobacco Use  . Smoking status: Never Smoker  . Smokeless tobacco: Never Used  Vaping Use  . Vaping Use: Never used  Substance and Sexual Activity  . Alcohol use: Yes    Comment: rarely - Holidays  . Drug use: No  . Sexual activity: Never  Other Topics Concern  . Not on file  Social History Narrative   Exercise- aerobic exercise at home 3-4x week.       Lives in [place]. No pets.   Social Determinants of Health   Financial Resource Strain: Low Risk   . Difficulty of Paying Living Expenses: Not hard at all  Food Insecurity: No Food Insecurity  . Worried About Charity fundraiser in the Last Year: Never true  . Ran Out of Food in the Last Year: Never true  Transportation Needs: No Transportation Needs  . Lack of Transportation (Medical): No  . Lack of Transportation (Non-Medical): No  Physical Activity:   . Days of Exercise per Week: Not on file  . Minutes of Exercise per Session: Not on file  Stress: No Stress Concern Present  . Feeling of Stress : Not at all  Social Connections: Unknown  . Frequency of Communication with Friends and Family: More than three times a week  . Frequency of Social Gatherings with Friends and Family: More than three times a week  . Attends Religious Services: 1 to 4 times per year  . Active Member of Clubs or Organizations: Yes  . Attends Theatre manager Meetings: 1 to 4 times per year  . Marital Status: Not on file  Intimate Partner Violence: Not At Risk  . Fear of Current or Ex-Partner: No  . Emotionally Abused: No  . Physically Abused: No  . Sexually Abused: No    FAMILY HISTORY: Family History  Problem Relation Age of Onset  . Heart disease Father 37       massive MI  . Diabetes Father   . Bipolar disorder Sister   . Osteoporosis Sister   . Breast cancer Sister 34       currently 52  . Cancer Sister        breast  . Bipolar disorder Maternal Aunt   . Breast cancer Maternal Aunt        age at dx unknown; deceased 29s  . Colon cancer Maternal Grandfather 52       deceased 24  . Cancer Maternal Grandfather        breast  . Osteoporosis Mother   . Colon cancer Mother  age at dx unknown; deceased 23  . Cancer Maternal Grandmother        unk. type; deceased 64  . Breast cancer Paternal Aunt        deceased 26    ALLERGIES:  is allergic to oxycodone and codeine.  MEDICATIONS:  Current Outpatient Medications  Medication Sig Dispense Refill  . anastrozole (ARIMIDEX) 1 MG tablet Take 1 tablet (1 mg total) by mouth daily. 90 tablet 1  . fluticasone (FLONASE) 50 MCG/ACT nasal spray Place 2 sprays into both nostrils daily. 16 g 2  . losartan (COZAAR) 50 MG tablet Take 1 tablet (50 mg total) by mouth daily. (Patient taking differently: Take 50 mg by mouth every morning. ) 90 tablet 3  . Multiple Vitamin (MULTIVITAMIN WITH MINERALS) TABS tablet Take 1 tablet by mouth daily.    . Multiple Vitamins-Minerals (ICAPS AREDS 2 PO) Take 1 capsule by mouth 2 (two) times daily.     . phenylephrine (SUDAFED PE) 10 MG TABS tablet Take 10 mg by mouth every 4 (four) hours as needed (for allergies/congestion.).     Marland Kitchen primidone (MYSOLINE) 50 MG tablet Take by mouth.    . propranolol ER (INDERAL LA) 160 MG SR capsule Take by mouth.    . topiramate (TOPAMAX) 100 MG tablet Take by mouth 2 (two) times daily.     . metoprolol  succinate (TOPROL-XL) 100 MG 24 hr tablet TAKE 1 TABLET DAILY (Patient not taking: Reported on 07/10/2020) 90 tablet 3  . rivaroxaban (XARELTO) 10 MG TABS tablet Take 1 tablet (10 mg total) by mouth daily. 90 tablet 0   No current facility-administered medications for this visit.      Marland Kitchen  PHYSICAL EXAMINATION: ECOG PERFORMANCE STATUS: 0 - Asymptomatic Vitals:   07/10/20 0947  BP: (!) 144/71  Pulse: (!) 56  Resp: 18  Temp: 98.3 F (36.8 C)   Filed Weights   07/10/20 0947  Weight: 227 lb 14.4 oz (103.4 kg)   Physical Exam Constitutional:      General: She is not in acute distress.    Appearance: She is obese. She is not diaphoretic.  HENT:     Head: Normocephalic and atraumatic.     Nose: Nose normal.     Mouth/Throat:     Pharynx: No oropharyngeal exudate.  Eyes:     General: No scleral icterus.    Pupils: Pupils are equal, round, and reactive to light.  Cardiovascular:     Rate and Rhythm: Normal rate and regular rhythm.     Heart sounds: No murmur heard.   Pulmonary:     Effort: Pulmonary effort is normal. No respiratory distress.     Breath sounds: No rales.  Chest:     Chest wall: No tenderness.  Abdominal:     General: There is no distension.     Palpations: Abdomen is soft.     Tenderness: There is no abdominal tenderness.  Musculoskeletal:        General: Normal range of motion.     Cervical back: Normal range of motion and neck supple.  Skin:    General: Skin is warm and dry.     Findings: No erythema.  Neurological:     Mental Status: She is alert and oriented to person, place, and time.     Cranial Nerves: No cranial nerve deficit.     Motor: No abnormal muscle tone.     Coordination: Coordination normal.  Psychiatric:  Mood and Affect: Affect normal.    Breast exam is performed in seated and lying down position. Patient is status post right mastectomy.  No palpable chest wall recurrence. No palpable bilateral axillary adenopathy.  No  palpable discrete mass in left breast.      LABORATORY DATA:  I have reviewed the data as listed Lab Results  Component Value Date   WBC 5.1 07/10/2020   HGB 12.3 07/10/2020   HCT 36.6 07/10/2020   MCV 89.1 07/10/2020   PLT 202 07/10/2020   Recent Labs    01/29/20 0914 01/29/20 0914 02/14/20 1707 04/02/20 0930 07/10/20 0931  NA 140  --   --  142 138  K 3.8  --   --  3.9 4.2  CL 111  --   --  109 107  CO2 21*  --   --  25 23  GLUCOSE 147*  --   --  118* 108*  BUN 14  --   --  19 21  CREATININE 1.03*   < > 1.04* 0.97 0.95  CALCIUM 8.9  --   --  9.4 8.9  GFRNONAA 52*   < > 51* 56* 57*  GFRAA >60   < > 60* >60 >60  PROT 6.8  --   --  7.1 6.9  ALBUMIN 3.7  --   --  3.8 3.7  AST 22  --   --  22 22  ALT 15  --   --  17 16  ALKPHOS 98  --   --  100 103  BILITOT 0.6  --   --  0.9 0.5   < > = values in this interval not displayed.    RADIOGRAPHIC STUDIES: I have personally reviewed the radiological images as listed and agreed with the findings in the report. No results found.   ASSESSMENT & PLAN:  79 y.o.female who has Stage IIIB colon cancer.,  History of DVT, presented for evaluation for newly diagnosed breast cancer. Cancer Staging Cancer of descending colon [facility]) Staging form: Colon and Rectum, AJCC 8th Edition - Pathologic: Stage IIIB (pT4a, pN1, cM0) - Signed by Earlie Server, MD on 04/30/2018  Malignant neoplasm of right breast in female, estrogen receptor positive ([place]) Staging form: Breast, AJCC 8th Edition - Clinical: No stage assigned - Unsigned - Pathologic: Stage IA (pT1c, pN0, cM0, G3, ER+, PR+, HER2-, Oncotype DX score: 8) - Signed by Earlie Server, MD on 01/30/2020  1. History of colon cancer   2. Malignant neoplasm of right breast in female, estrogen receptor positive, unspecified site of breast ([place])   3. History of deep vein thrombosis (DVT) of lower extremity   4. Long term current use of aromatase inhibitor   5. Osteopenia, unspecified location      #Stage IA, right Breast cancer, pT1cN0 ER/PR positive, HER-2 negative. S/p mastectomy with no residual tumor identified. Labs are reviewed and discussed with patient. She tolerates Arimidex 1 mg daily.  Continue.  Plan 10 years if she tolerates.  # History of colon cancer - diagnosed 06/07/2017, finished all treatment April 2019.  Surveillance CT scan every 6 months for the first 2 years.  Next due September 2021, I will obtain.Marland Kitchen  #History of unprovoked age-indeterminate occlusive DVT.  Patient was previously on Xarelto 10 mg daily for prophylaxis.  She is currently off anticoagulation.  I discussed with her about the rationale of anticoagulation prophylaxis.  She has bilateral lower extremity varicose, history of unprovoked DVT, she is at risk  of recurrence.  She agrees with the restarted on Xarelto 10 mg daily.  Prescription sent to pharmacy.  #Osteopenia,  continue calcium and vitamin D supplementation. She is not interested in bisphosphonate treatment Will obtain DEXA every 2 years.  Orders Placed This Encounter  Procedures  . CT CHEST ABDOMEN PELVIS W CONTRAST    Standing Status:   Future    Standing Expiration Date:   07/10/2021    Order Specific Question:   Reason for Exam (SYMPTOM  OR DIAGNOSIS REQUIRED)    Answer:   history of colon cancer- surveillance    Order Specific Question:   Preferred imaging location?    Answer:   [facility]    Order Specific Question:   Radiology Contrast Protocol - do NOT remove file path    Answer:   [email or web address]  . CBC with Differential/Platelet    Standing Status:   Future    Standing Expiration Date:   07/10/2021  . Comprehensive metabolic panel    Standing Status:   Future    Standing Expiration Date:   07/10/2021  . CEA    Standing Status:   Future    Standing Expiration Date:   07/10/2021    Return of visit: 3 months  Earlie Server, MD, PhD Hematology Oncology [facility]  at [facility] Pager- [phone] 07/10/2020

## 2020-07-10 NOTE — Progress Notes (Signed)
Patient denies new problems/concerns today.   °

## 2020-07-11 LAB — CEA: CEA: 1.6 ng/mL (ref 0.0–4.7)

## 2020-07-29 ENCOUNTER — Ambulatory Visit
Admission: RE | Admit: 2020-07-29 | Discharge: 2020-07-29 | Disposition: A | Discharge status: Home / Self Care | Payer: PPO | Source: Ambulatory Visit | Attending: Oncology | Admitting: Oncology

## 2020-07-29 ENCOUNTER — Other Ambulatory Visit: Payer: Self-pay

## 2020-07-29 DIAGNOSIS — Z85038 Personal history of other malignant neoplasm of large intestine: Secondary | ICD-10-CM

## 2020-07-29 DIAGNOSIS — I700 Atherosclerosis of aorta: Secondary | ICD-10-CM | POA: Diagnosis not present

## 2020-07-29 DIAGNOSIS — R188 Other ascites: Secondary | ICD-10-CM | POA: Diagnosis not present

## 2020-07-29 DIAGNOSIS — C189 Malignant neoplasm of colon, unspecified: Secondary | ICD-10-CM | POA: Diagnosis not present

## 2020-07-29 DIAGNOSIS — J984 Other disorders of lung: Secondary | ICD-10-CM | POA: Diagnosis not present

## 2020-07-29 DIAGNOSIS — I2510 Atherosclerotic heart disease of native coronary artery without angina pectoris: Secondary | ICD-10-CM | POA: Diagnosis not present

## 2020-07-29 MED ORDER — IOHEXOL 300 MG/ML  SOLN
125.0000 mL | Freq: Once | INTRAMUSCULAR | Status: AC | PRN
  Administered 2020-07-29: 125 mL via INTRAVENOUS

## 2020-08-31 DIAGNOSIS — H353190 Nonexudative age-related macular degeneration, unspecified eye, stage unspecified: Secondary | ICD-10-CM | POA: Diagnosis not present

## 2020-09-14 DIAGNOSIS — G250 Essential tremor: Secondary | ICD-10-CM | POA: Diagnosis not present

## 2020-10-07 ENCOUNTER — Other Ambulatory Visit: Payer: Self-pay | Admitting: *Deleted

## 2020-10-07 MED ORDER — RIVAROXABAN 10 MG PO TABS: 10.0000 mg | ORAL_TABLET | Freq: Every day | ORAL | 0 refills | Status: DC

## 2020-10-12 ENCOUNTER — Inpatient Hospital Stay: Payer: PPO | Attending: Oncology

## 2020-10-12 ENCOUNTER — Encounter: Payer: Self-pay | Admitting: Oncology

## 2020-10-12 ENCOUNTER — Inpatient Hospital Stay (HOSPITAL_BASED_OUTPATIENT_CLINIC_OR_DEPARTMENT_OTHER): Payer: PPO | Admitting: Oncology

## 2020-10-12 VITALS — BP 120/61 | HR 59 | Temp 98.0°F | Resp 20 | Wt 222.7 lb

## 2020-10-12 DIAGNOSIS — Z923 Personal history of irradiation: Secondary | ICD-10-CM | POA: Insufficient documentation

## 2020-10-12 DIAGNOSIS — Z79811 Long term (current) use of aromatase inhibitors: Secondary | ICD-10-CM | POA: Insufficient documentation

## 2020-10-12 DIAGNOSIS — C50911 Malignant neoplasm of unspecified site of right female breast: Secondary | ICD-10-CM

## 2020-10-12 DIAGNOSIS — Z170 Estrogen receptor positive status [ER+]: Secondary | ICD-10-CM | POA: Diagnosis not present

## 2020-10-12 DIAGNOSIS — Z9011 Acquired absence of right breast and nipple: Secondary | ICD-10-CM | POA: Diagnosis not present

## 2020-10-12 DIAGNOSIS — Z85038 Personal history of other malignant neoplasm of large intestine: Secondary | ICD-10-CM | POA: Diagnosis not present

## 2020-10-12 DIAGNOSIS — Z9221 Personal history of antineoplastic chemotherapy: Secondary | ICD-10-CM | POA: Insufficient documentation

## 2020-10-12 DIAGNOSIS — Z86718 Personal history of other venous thrombosis and embolism: Secondary | ICD-10-CM | POA: Insufficient documentation

## 2020-10-12 DIAGNOSIS — C186 Malignant neoplasm of descending colon: Secondary | ICD-10-CM | POA: Insufficient documentation

## 2020-10-12 DIAGNOSIS — M8580 Other specified disorders of bone density and structure, unspecified site: Secondary | ICD-10-CM | POA: Diagnosis not present

## 2020-10-12 DIAGNOSIS — Z79899 Other long term (current) drug therapy: Secondary | ICD-10-CM | POA: Insufficient documentation

## 2020-10-12 DIAGNOSIS — Z7901 Long term (current) use of anticoagulants: Secondary | ICD-10-CM | POA: Insufficient documentation

## 2020-10-12 LAB — CBC WITH DIFFERENTIAL/PLATELET
Abs Immature Granulocytes: 0.01 10*3/uL (ref 0.00–0.07)
Basophils Absolute: 0 10*3/uL (ref 0.0–0.1)
Basophils Relative: 1 %
Eosinophils Absolute: 0.2 10*3/uL (ref 0.0–0.5)
Eosinophils Relative: 4 %
HCT: 37.8 % (ref 36.0–46.0)
Hemoglobin: 12 g/dL (ref 12.0–15.0)
Immature Granulocytes: 0 %
Lymphocytes Relative: 25 %
Lymphs Abs: 1.1 10*3/uL (ref 0.7–4.0)
MCH: 29.6 pg (ref 26.0–34.0)
MCHC: 31.7 g/dL (ref 30.0–36.0)
MCV: 93.3 fL (ref 80.0–100.0)
Monocytes Absolute: 0.3 10*3/uL (ref 0.1–1.0)
Monocytes Relative: 6 %
Neutro Abs: 2.7 10*3/uL (ref 1.7–7.7)
Neutrophils Relative %: 64 %
Platelets: 215 10*3/uL (ref 150–400)
RBC: 4.05 MIL/uL (ref 3.87–5.11)
RDW: 14.5 % (ref 11.5–15.5)
WBC: 4.2 10*3/uL (ref 4.0–10.5)
nRBC: 0 % (ref 0.0–0.2)

## 2020-10-12 LAB — COMPREHENSIVE METABOLIC PANEL
ALT: 15 U/L (ref 0–44)
AST: 22 U/L (ref 15–41)
Albumin: 3.7 g/dL (ref 3.5–5.0)
Alkaline Phosphatase: 94 U/L (ref 38–126)
Anion gap: 9 (ref 5–15)
BUN: 13 mg/dL (ref 8–23)
CO2: 24 mmol/L (ref 22–32)
Calcium: 9.4 mg/dL (ref 8.9–10.3)
Chloride: 107 mmol/L (ref 98–111)
Creatinine, Ser: 0.9 mg/dL (ref 0.44–1.00)
GFR, Estimated: >60 mL/min (ref >60)
Glucose, Bld: 164 mg/dL — ABNORMAL HIGH (ref 70–99)
Potassium: 4.2 mmol/L (ref 3.5–5.1)
Sodium: 140 mmol/L (ref 135–145)
Total Bilirubin: 0.5 mg/dL (ref 0.3–1.2)
Total Protein: 6.9 g/dL (ref 6.5–8.1)

## 2020-10-12 MED ORDER — RIVAROXABAN 10 MG PO TABS: 10.0000 mg | ORAL_TABLET | Freq: Every day | ORAL | 5 refills | Status: DC

## 2020-10-12 NOTE — Progress Notes (Signed)
Hematology/Oncology Follow Up visit. [place] [phone] [phone]  CONSULT NOTE Patient Care Team: Burnard Hawthorne, FNP as PCP - General (Family Medicine) Vladimir Crofts, MD (Neurology) Noreene Filbert, MD as Referring Physician (Radiation Oncology) Leighton Ruff, MD as Consulting Physician (General Surgery) Earlie Server, MD as Consulting Physician (Oncology) Jules Husbands, MD as Consulting Physician (General Surgery) Rico Junker, RN as Registered Nurse Theodore Demark, RN as Registered Nurse Fredirick Maudlin, MD as Consulting Physician (General Surgery)  PURPOSE OF CONSULTATION:  Follow up for blood clot, and stage IIIB colon cancer, Stage IA right breast cancer  HISTORY OF PRESENTING ILLNESS:  [name] 78 y.o.  female with PMH listed as below was referred to me for evaluation and management of colon cancer.  She had colonoscopy evaluation due to positive cologuard.  She was found to have a nonobstructing mass approximately 20 cm from the anal verge which biopsy proved to be adenocarcinoma. CT scan showed a large mass from the sigmoid and extends beyond the wall of the colon toward the left into the surrounding fat. Preoperational CEA was normal. Patient underwent robotic sigmoidectomy on 06/07/2017.   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 MLH1  loss of function, PMS2 loss of function. Declined option of clinical trial ATOMIC She has a history of endometrial cancer s/p hysterotomy and radiation. She has a family history of colon cancer and breast cancer.  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 Results have been explained to patient by genetic counselor.   # Thyroid nodule, incidental finding from CT surveillance for colon cancer. Discussed  with patient's primary care provider [name].  Patient to follow-up with PCP for further evaluation with ultrasound thyroid.   Oncology treatment S/p adjuvant chemotherapy finished February 2019 Adjuvant RT finished in April 2019.  05/22/2018 Colonoscopy surveillance by Dr.Wohl showed 10 mm polyp in ascending colon.  Removed.  Patent end-to-end colo-colonic anastomosis Pathology showed benign polyp.  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   # mammogram on 11/19/2019.  Right breast mass warrants further evaluation. Right diagnostic mammogram showed 6 x 5 x 7 mm lobular hypoechoic mass, 7:00, 3 cm from nipple.  No right axillary adenopathy ultrasound-guided core needle biopsy was obtained. Pathology showed invasive mammary carcinoma, no special type.  Grade 3, ER/PR strongly positive.  90%, HER-2 negative.  # 01/03/2020 underwent right lumpectomy with SLNB Invasive mammary carcinoma, grade 3, DCIS present, margin is uninvolved by invasive carcinoma, positive for DCIS, multifocal, superior.  pT1c pN0 (sn). #OncotypeDx recurrence score 8, absolute chemotherapy benefit <1% February 2021, patient had Mediport removed.  01/22/20 Bilateral breast MRI was obtain for further evaluation of extent of remaining DCIS.  Image showed seroma with diffuse non mass enhancement in the lower outer right breast extending from mid to posterior depth. Skin thickening. no focally suspicious mass is identified along the margins of the seroma or in the remainder of the right breast.Indeterminate 1cm focus of linear enhancement in the central left breast.  01/21/20 left breast biopsy was negative for malignancy.  # 02/14/2020 patient underwent right breast mastectomy.  No residual malignancy was discovered. Patient reports feeling well today.  Denies any concerns regarding her surgical sites, no erythema, soreness, discharge. 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    #  April 2021 Patient started on Arimidex and she reports tolerating well without any side effects. INTERVAL HISTORY 79 yo female with above history presents for follow up for history of colon cancer, diagnosed breast cancer and history of DVT. She take Xarelto 26m daily. She tolerates well and no bleeding events.  On Arimidex daily and tolerates well.  No new complaints today.    Review of Systems  Constitutional: Negative for appetite change, chills, fatigue and fever.  HENT:   Negative for hearing loss and voice change.   Eyes: Negative for eye problems.  Respiratory: Negative for chest tightness and cough.   Cardiovascular: Negative for chest pain and leg swelling.  Gastrointestinal: Negative for abdominal distention, abdominal pain, blood in stool and diarrhea.  Endocrine: Negative for hot flashes.  Genitourinary: Negative for difficulty urinating and frequency.   Musculoskeletal: Negative for arthralgias.  Skin: Negative for itching and rash.  Neurological: Negative for extremity weakness.  Hematological: Negative for adenopathy.  Psychiatric/Behavioral: Negative for confusion.   MEDICAL HISTORY:  Past Medical History:  Diagnosis Date  . Arthritis    bitateral knees and left foot,s/p cortisone injection in past  . Chronic constipation   . [place]   pt is undergoing chemo 11-09-17  . Complication of anesthesia     slow to wake up   . [place]ometrial cancer ([place]   Total Hysterectomy and Rad tx's.   . Family history of cancer   . Genetic testing 09/26/2017    Multi-Cancer panel (83 genes) @ [facility] - No pathogenic mutations detected  . Hypertension   . IBS (irritable bowel syndrome)   . Macular degeneration of both eyes   . Menopause    age 79 . PONV (postoperative nausea and vomiting)   . Vitamin D deficiency 06/07/10    SURGICAL HISTORY: Past Surgical History:  Procedure Laterality Date  . BREAST BIOPSY Right 12/04/2019   [facility]-INVASIVE  MAMMARY CARCINOMA  . BREAST LUMPECTOMY Right 01/03/2020  . BREAST LUMPECTOMY WITH NEEDLE LOCALIZATION AND AXILLARY SENTINEL LYMPH NODE BX Right 01/03/2020   Procedure: BREAST LUMPECTOMY WITH NEEDLE LOCALIZATION AND AXILLARY SENTINEL LYMPH NODE BX;  Surgeon: CFredirick Maudlin MD;  Location: [facility];  Service: General;  Laterality: Right;  . BUNIONECTOMY    . CATARACT EXTRACTION W/PHACO Left 09/24/2018   Procedure: CATARACT EXTRACTION PHACO AND INTRAOCULAR LENS PLACEMENT ([place];  Surgeon: KEulogio Bear MD;  [facility]  Service: Ophthalmology;  Laterality: Left;  . CATARACT EXTRACTION W/PHACO Right 10/15/2018   Procedure: CATARACT EXTRACTION PHACO AND INTRAOCULAR LENS PLACEMENT ([place] RIGHT;  Surgeon: KEulogio Bear MD;  [place]  Service: Ophthalmology;  Laterality: Right;  . cataract surgery     both eyes  . COLONOSCOPY WITH PROPOFOL N/A 04/04/2017   Procedure: COLONOSCOPY WITH PROPOFOL;  Surgeon: WLucilla Lame MD;  [facility];  Service: Endoscopy;  Laterality: N/A;  . COLONOSCOPY WITH PROPOFOL N/A 05/22/2018   Procedure: COLONOSCOPY WITH PROPOFOL;  Surgeon: WLucilla Lame MD;  [facility];  Service: Endoscopy;  Laterality: N/A;  . DILATION AND CURETTAGE OF UTERUS  1971  . FOOT SURGERY    . NASAL SINUS SURGERY    . PORT-A-CATH REMOVAL Right 02/14/2020   Procedure: REMOVAL PORT-A-CATH;  Surgeon: CFredirick Maudlin MD;  Location: [facility];  Service: General;  Laterality: Right;  . PORTACATH PLACEMENT Right 07/12/2017   Procedure: INSERTION PORT-A-CATH;  Surgeon: PJules Husbands MD;  Location: [facility];  Service: General;  Laterality: Right;  . ROBOTIC ASSISTED LAPAROSCOPIC VENTRAL/INCISIONAL HERNIA REPAIR N/A 06/07/2017   Procedure: ROBOTIC INCISIONAL HERNIA REPAIR;  Surgeon: Leighton Ruff, MD;  [facility];  Service: General;  Laterality: N/A;  . SIMPLE MASTECTOMY WITH AXILLARY SENTINEL NODE BIOPSY Right 02/14/2020    Procedure: Right SIMPLE MASTECTOMY;  Surgeon: Fredirick Maudlin, MD;  Location: [facility];  Service: General;  Laterality: Right;  Marland Kitchen VAGINAL DELIVERY    . VAGINAL HYSTERECTOMY      SOCIAL HISTORY: Social History   Socioeconomic History  . Marital status: Divorced    Spouse name: Not on file  . Number of children: Not on file  . Years of education: Not on file  . Highest education level: Not on file  Occupational History  . Not on file  Tobacco Use  . Smoking status: Never Smoker  . Smokeless tobacco: Never Used  Vaping Use  . Vaping Use: Never used  Substance and Sexual Activity  . Alcohol use: Yes    Comment: rarely - Holidays  . Drug use: No  . Sexual activity: Never  Other Topics Concern  . Not on file  Social History Narrative   Exercise- aerobic exercise at home 3-4x week.       Lives in [place]. No pets.   Social Determinants of Health   Financial Resource Strain: Low Risk   . Difficulty of Paying Living Expenses: Not hard at all  Food Insecurity: No Food Insecurity  . Worried About Charity fundraiser in the Last Year: Never true  . Ran Out of Food in the Last Year: Never true  Transportation Needs: No Transportation Needs  . Lack of Transportation (Medical): No  . Lack of Transportation (Non-Medical): No  Physical Activity:   . Days of Exercise per Week: Not on file  . Minutes of Exercise per Session: Not on file  Stress: No Stress Concern Present  . Feeling of Stress : Not at all  Social Connections: Unknown  . Frequency of Communication with Friends and Family: More than three times a week  . Frequency of Social Gatherings with Friends and Family: More than three times a week  . Attends Religious Services: 1 to 4 times per year  . Active Member of Clubs or Organizations: Yes  . Attends Archivist Meetings: 1 to 4 times per year  . Marital Status: Not on file  Intimate Partner Violence: Not At Risk  . Fear of Current or Ex-Partner: No  .  Emotionally Abused: No  . Physically Abused: No  . Sexually Abused: No    FAMILY HISTORY: Family History  Problem Relation Age of Onset  . Heart disease Father 48       massive MI  . Diabetes Father   . Bipolar disorder Sister   . Osteoporosis Sister   . Breast cancer Sister 33       currently 91  . Cancer Sister        breast  . Bipolar disorder Maternal Aunt   . Breast cancer Maternal Aunt        age at dx unknown; deceased 18s  . Colon cancer Maternal Grandfather 53       deceased 26  . Cancer Maternal Grandfather        breast  . Osteoporosis Mother   . Colon cancer Mother        age at dx unknown; deceased 60  . Cancer Maternal Grandmother        unk.  type; deceased 67  . Breast cancer Paternal Aunt        deceased 34    ALLERGIES:  is allergic to oxycodone and codeine.  MEDICATIONS:  Current Outpatient Medications  Medication Sig Dispense Refill  . anastrozole (ARIMIDEX) 1 MG tablet Take 1 tablet (1 mg total) by mouth daily. 90 tablet 1  . losartan (COZAAR) 50 MG tablet Take 1 tablet (50 mg total) by mouth daily. (Patient taking differently: Take 50 mg by mouth every morning. ) 90 tablet 3  . Multiple Vitamin (MULTIVITAMIN WITH MINERALS) TABS tablet Take 1 tablet by mouth daily.    . Multiple Vitamins-Minerals (ICAPS AREDS 2 PO) Take 1 capsule by mouth 2 (two) times daily.     . phenylephrine (SUDAFED PE) 10 MG TABS tablet Take 10 mg by mouth every 4 (four) hours as needed (for allergies/congestion.).     Marland Kitchen propranolol ER (INDERAL LA) 160 MG SR capsule Take by mouth.    . rivaroxaban (XARELTO) 10 MG TABS tablet Take 1 tablet (10 mg total) by mouth daily. 30 tablet 5  . topiramate (TOPAMAX) 100 MG tablet Take by mouth 2 (two) times daily.     . fluticasone (FLONASE) 50 MCG/ACT nasal spray Place 2 sprays into both nostrils daily. 16 g 2  . metoprolol succinate (TOPROL-XL) 100 MG 24 hr tablet TAKE 1 TABLET DAILY (Patient not taking: Reported on 07/10/2020) 90 tablet 3    No current facility-administered medications for this visit.      Marland Kitchen  PHYSICAL EXAMINATION: ECOG PERFORMANCE STATUS: 0 - Asymptomatic Vitals:   10/12/20 0949  BP: 120/61  Pulse: (!) 59  Resp: 20  Temp: 98 F (36.7 C)  SpO2: 100%   Filed Weights   10/12/20 0949  Weight: 222 lb 11.2 oz (101 kg)   Physical Exam Constitutional:      General: She is not in acute distress.    Appearance: She is obese. She is not diaphoretic.  HENT:     Head: Normocephalic and atraumatic.     Nose: Nose normal.     Mouth/Throat:     Pharynx: No oropharyngeal exudate.  Eyes:     General: No scleral icterus.    Pupils: Pupils are equal, round, and reactive to light.  Cardiovascular:     Rate and Rhythm: Normal rate and regular rhythm.     Heart sounds: No murmur heard.   Pulmonary:     Effort: Pulmonary effort is normal. No respiratory distress.     Breath sounds: No rales.  Chest:     Chest wall: No tenderness.  Abdominal:     General: There is no distension.     Palpations: Abdomen is soft.     Tenderness: There is no abdominal tenderness.  Musculoskeletal:        General: Normal range of motion.     Cervical back: Normal range of motion and neck supple.  Skin:    General: Skin is warm and dry.     Findings: No erythema.  Neurological:     Mental Status: She is alert and oriented to person, place, and time.     Cranial Nerves: No cranial nerve deficit.     Motor: No abnormal muscle tone.     Coordination: Coordination normal.  Psychiatric:        Mood and Affect: Affect normal.    Breast exam is performed in seated  position. Patient is status post right mastectomy.  No palpable chest wall recurrence.  No palpable bilateral axillary adenopathy.  No palpable discrete mass in left breast.      LABORATORY DATA:  I have reviewed the data as listed Lab Results  Component Value Date   WBC 4.2 10/12/2020   HGB 12.0 10/12/2020   HCT 37.8 10/12/2020   MCV 93.3 10/12/2020    PLT 215 10/12/2020   Recent Labs    01/29/20 0914 02/14/20 1707 04/02/20 0930 07/10/20 0931 10/12/20 0917  NA   < >  --  142 138 140  K   < >  --  3.9 4.2 4.2  CL   < >  --  109 107 107  CO2   < >  --  _0 GLUCOSE   < >  --  118* 108* 164*  BUN   < >  --  _1 CREATININE   < > 1.04* 0.97 0.95 0.90  CALCIUM   < >  --  9.4 8.9 9.4  GFRNONAA   < > 51* 56* 57* >60  GFRAA  --  60* >60 >60  --   PROT   < >  --  7.1 6.9 6.9  ALBUMIN   < >  --  3.8 3.7 3.7  AST   < >  --  _2 ALT   < >  --  _3 ALKPHOS   < >  --  100 103 94  BILITOT   < >  --  0.9 0.5 0.5   < > = values in this interval not displayed.    RADIOGRAPHIC STUDIES: I have personally reviewed the radiological images as listed and agreed with the findings in the report. CT CHEST ABDOMEN PELVIS W CONTRAST  Result Date: 07/29/2020 CLINICAL DATA:  79 year old female with history of stage IIIB colon cancer in stage IA right breast cancer. Follow-up study. EXAM: CT CHEST, ABDOMEN, AND PELVIS WITH CONTRAST TECHNIQUE: Multidetector CT imaging of the chest, abdomen and pelvis was performed following the standard protocol during bolus administration of intravenous contrast. CONTRAST:  141m OMNIPAQUE IOHEXOL 300 MG/ML  SOLN COMPARISON:  CT the chest, abdomen and pelvis 01/27/2020. FINDINGS: CT CHEST FINDINGS Cardiovascular: Heart size is normal. There is no significant pericardial fluid, thickening or pericardial calcification. There is aortic atherosclerosis, as well as atherosclerosis of the great vessels of the mediastinum and the coronary arteries, including calcified atherosclerotic plaque in the left main, left anterior descending, left circumflex and right coronary arteries. Mediastinum/Nodes: No pathologically enlarged mediastinal or hilar lymph nodes. Esophagus is unremarkable in appearance. No axillary lymphadenopathy. Lungs/Pleura: Tiny 2 mm pulmonary nodules scattered in the lungs, stable in size and number  compared to the prior study in retrospect, favored to be benign. No other larger more suspicious appearing pulmonary nodules or masses are noted. No acute consolidative airspace disease. No pleural effusions. Mild chronic linear scarring in the anterior aspect of the left upper lobe and inferior segment of the lingula. Musculoskeletal: Status post right modified radical mastectomy. In the mastectomy bed there is a postoperative fluid collection measuring approximately 14.5 x 1.9 cm, likely to represent a postoperative seroma. There are no aggressive appearing lytic or blastic lesions noted in the visualized portions of the skeleton. CT ABDOMEN PELVIS FINDINGS Hepatobiliary: No suspicious cystic or solid hepatic lesions. No intra or extrahepatic biliary ductal dilatation. Gallbladder is normal in appearance. Pancreas: No pancreatic mass. No pancreatic ductal dilatation. No pancreatic or peripancreatic fluid collections or inflammatory changes.  Spleen: Unremarkable. Adrenals/Urinary Tract: Bilateral kidneys and bilateral adrenal glands are normal in appearance. No hydroureteronephrosis. Urinary bladder is normal in appearance. Stomach/Bowel: Normal appearance of the stomach. No pathologic dilatation of small bowel or colon. Postoperative changes of partial colectomy at the rectosigmoid junction. No unexpected soft tissue mass adjacent to the suture line to suggest residual/locally recurrent disease. Normal appendix. Vascular/Lymphatic: Aortic atherosclerosis, without evidence of aneurysm or dissection in the abdominal or pelvic vasculature. No lymphadenopathy noted in the abdomen or pelvis. Reproductive: Status post hysterectomy. Ovaries are not confidently identified may be surgically absent or atrophic. Other: Trace volume of ascites.  No pneumoperitoneum. Musculoskeletal: There are no aggressive appearing lytic or blastic lesions noted in the visualized portions of the skeleton. IMPRESSION: 1. No findings to  suggest metastatic disease in the chest, abdomen or pelvis. 2. Status post right modified radical mastectomy with postoperative fluid collection in the surgical bed, likely to represent a postoperative seroma. 3. Aortic atherosclerosis, in addition to left main and 3 vessel coronary artery disease. Assessment for potential risk factor modification, dietary therapy or pharmacologic therapy may be warranted, if clinically indicated. 4. Additional postoperative changes and incidental findings, as above. Electronically Signed   By: Vinnie Langton M.D.   On: 07/29/2020 11:23     ASSESSMENT & PLAN:  79 y.o.female who has Stage IIIB colon cancer.,  History of DVT, presented for evaluation for newly diagnosed breast cancer. Cancer Staging Cancer of descending colon [facility]) Staging form: Colon and Rectum, AJCC 8th Edition - Pathologic: Stage IIIB (pT4a, pN1, cM0) - Signed by Earlie Server, MD on 04/30/2018  Malignant neoplasm of right breast in female, estrogen receptor positive ([place]) Staging form: Breast, AJCC 8th Edition - Clinical: No stage assigned - Unsigned - Pathologic: Stage IA (pT1c, pN0, cM0, G3, ER+, PR+, HER2-, Oncotype DX score: 8) - Signed by Earlie Server, MD on 01/30/2020  1. History of colon cancer   2. Malignant neoplasm of right breast in female, estrogen receptor positive, unspecified site of breast ([place])   3. History of deep vein thrombosis (DVT) of lower extremity   4. Long term current use of aromatase inhibitor   5. Osteopenia, unspecified location     #Stage IA, right Breast cancer, pT1cN0 ER/PR positive, HER-2 negative. S/p mastectomy with no residual tumor identified. Labs are reviewed and discussed with patient. Continue Armidex.  Plan screening mammogram of left breast in Jan 2021.   # History of colon cancer - diagnosed 06/07/2017, finished all treatment April 2019.  Surveillance CT  September 2021 showed no disease recurrence. It has been 3 years since surgery. Will increase CT  interval to annually. Continue CEA Q6 months.   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 Continue  Xarelto 10 mg daily for prophylaxis.   #Osteopenia,  continue calcium and vitamin D supplementation. She declined bisphosphonate treatment Will obtain DEXA every 2 years. # Atherosclerosis disease on CT. Discussed with patient. She needs to follow up with PCP for management.   Orders Placed This Encounter  Procedures  . MM Digital Screening Unilat L    Standing Status:   Future    Standing Expiration Date:   10/12/2021    Order Specific Question:   Reason for Exam (SYMPTOM  OR DIAGNOSIS REQUIRED)    Answer:   left screening mammogram    Order Specific Question:   Preferred imaging location?    Answer:   [facility]  . US Breast Limited Uni Left Inc Axilla    Standing Status:  Future    Standing Expiration Date:   10/12/2021    Order Specific Question:   Reason for Exam (SYMPTOM  OR DIAGNOSIS REQUIRED)    Answer:   History of colon cancer/hx of breast cancer    Order Specific Question:   Preferred imaging location?    Answer:   [facility]  . MM DIAG BREAST TOMO UNI LEFT    Standing Status:   Future    Standing Expiration Date:   10/12/2021    Order Specific Question:   Reason for Exam (SYMPTOM  OR DIAGNOSIS REQUIRED)    Answer:   History of colon cancer/hx of breast cancer    Order Specific Question:   Preferred imaging location?    Answer:   [facility]  . CBC with Differential/Platelet    Standing Status:   Future    Standing Expiration Date:   10/12/2021  . Comprehensive metabolic panel    Standing Status:   Future    Standing Expiration Date:   10/12/2021  . CEA    Standing Status:   Future    Standing Expiration Date:   10/12/2021    Return of visit: 6 months  Earlie Server, MD, PhD Hematology Oncology [facility] Pager- [phone] 10/12/2020

## 2020-10-13 LAB — CEA: CEA: 1 ng/mL (ref 0.0–4.7)

## 2020-10-16 ENCOUNTER — Telehealth: Payer: Self-pay | Admitting: Family

## 2020-10-16 NOTE — Telephone Encounter (Signed)
reviewing note from dr Tasia Catchings Recent ct abdomen showed atherosclerosis; please sch f/u to discuss Prefer in person so we can get fasting cholesterol lab during visit

## 2020-10-16 NOTE — Telephone Encounter (Signed)
Patient scheduled with you 12/04/20 & knows to be fasting at appointment.

## 2020-10-19 ENCOUNTER — Other Ambulatory Visit: Payer: Self-pay | Admitting: *Deleted

## 2020-10-19 MED ORDER — ANASTROZOLE 1 MG PO TABS: 1.0000 mg | ORAL_TABLET | Freq: Every day | ORAL | 1 refills | Status: DC

## 2020-10-27 ENCOUNTER — Encounter: Payer: Self-pay | Admitting: Radiation Oncology

## 2020-10-28 ENCOUNTER — Ambulatory Visit
Admission: RE | Admit: 2020-10-28 | Discharge: 2020-10-28 | Disposition: A | Discharge status: Home / Self Care | Payer: PPO | Source: Ambulatory Visit | Attending: Radiation Oncology | Admitting: Radiation Oncology

## 2020-10-28 VITALS — BP 136/56 | HR 51 | Temp 95.7°F | Resp 18 | Wt 220.3 lb

## 2020-10-28 DIAGNOSIS — Z170 Estrogen receptor positive status [ER+]: Secondary | ICD-10-CM | POA: Diagnosis not present

## 2020-10-28 DIAGNOSIS — Z79811 Long term (current) use of aromatase inhibitors: Secondary | ICD-10-CM | POA: Insufficient documentation

## 2020-10-28 DIAGNOSIS — Z85048 Personal history of other malignant neoplasm of rectum, rectosigmoid junction, and anus: Secondary | ICD-10-CM | POA: Diagnosis not present

## 2020-10-28 DIAGNOSIS — C50911 Malignant neoplasm of unspecified site of right female breast: Secondary | ICD-10-CM | POA: Insufficient documentation

## 2020-10-28 DIAGNOSIS — Z923 Personal history of irradiation: Secondary | ICD-10-CM | POA: Diagnosis not present

## 2020-10-28 DIAGNOSIS — C187 Malignant neoplasm of sigmoid colon: Secondary | ICD-10-CM

## 2020-10-28 NOTE — Progress Notes (Signed)
Radiation Oncology Follow up Note  Name: [name]   Date:   10/28/2020 MRN:  [id number] DOB: [birth year]    This 79 y.o. female presents to the clinic today for 2-1/2-year follow-up status post adjuvant radiation therapy for stage IIIb local advanced rectal cancer in patient with history of endometrial carcinoma who had received vaginal brachytherapy.Marland Kitchen  REFERRING PROVIDER: Burnard Hawthorne, FNP  HPI: Patient is a 79 year old female now out 2-1/2 years having for the completed adjuvant radiation therapy for locally advanced rectal cancer stage IIIb.  Seen today in routine follow-up she is doing well specifically denies diarrhea fatigue any increased lower urinary tract symptoms..  She had a recent CT scan of the abdomen chest and pelvis back in September showing no findings to suggest metastatic disease or recurrent disease.  She also had an MRI of her left breast back in March with biopsy showing fibrocystic changes no evidence of malignancy.  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  She specifically denies any bowel problems any diarrhea or abdominal pain or discomfort.  COMPLICATIONS OF TREATMENT: none  FOLLOW UP COMPLIANCE: keeps appointments   PHYSICAL EXAM:  BP (!) 136/56   Pulse (!) 51   Temp (!) 95.7 F (35.4 C) (Tympanic)   Resp 18   Wt 220 lb 4.8 oz (99.9 kg)   BMI 36.66 kg/m  Well-developed well-nourished patient in NAD. HEENT reveals PERLA, EOMI, discs not visualized.  Oral cavity is clear. No oral mucosal lesions are identified. Neck is clear without evidence of cervical or supraclavicular adenopathy. Lungs are clear to A&P. Cardiac examination is essentially unremarkable with regular rate and rhythm without murmur rub or thrill. Abdomen is benign with no organomegaly or masses noted. Motor sensory and DTR levels are equal and symmetric in the upper and lower extremities. Cranial  nerves II through XII are grossly intact. Proprioception is intact. No peripheral adenopathy or edema is identified. No motor or sensory levels are noted. Crude visual fields are within normal range.  RADIOLOGY RESULTS: CT scan of abdomen pelvis reviewed compatible with above-stated findings  PLAN: Present time patient is now 2 and half years out from adjuvant radiation therapy to her rectum with minimal side effects or problems.  She also recently had a mastectomy for stage I breast cancer.  At this time I am to turn follow-up care over to Dr. Tasia Catchings.  Would be happy to reevaluate the patient anytime should further radiation oncology input be indicated.    I would like to take this opportunity to thank you for allowing me to participate in the care of your patient.Noreene Filbert, MD

## 2020-11-12 ENCOUNTER — Other Ambulatory Visit: Payer: Self-pay | Admitting: *Deleted

## 2020-11-12 MED ORDER — RIVAROXABAN 10 MG PO TABS: 10.0000 mg | ORAL_TABLET | Freq: Every day | ORAL | 5 refills | Status: DC

## 2020-11-19 ENCOUNTER — Ambulatory Visit
Admission: RE | Admit: 2020-11-19 | Discharge: 2020-11-19 | Disposition: A | Discharge status: Home / Self Care | Payer: PPO | Source: Ambulatory Visit | Attending: Oncology | Admitting: Oncology

## 2020-11-19 ENCOUNTER — Other Ambulatory Visit: Payer: Self-pay

## 2020-11-19 DIAGNOSIS — Z853 Personal history of malignant neoplasm of breast: Secondary | ICD-10-CM | POA: Diagnosis not present

## 2020-11-19 DIAGNOSIS — Z85038 Personal history of other malignant neoplasm of large intestine: Secondary | ICD-10-CM

## 2020-11-19 DIAGNOSIS — C50911 Malignant neoplasm of unspecified site of right female breast: Secondary | ICD-10-CM

## 2020-11-19 DIAGNOSIS — Z170 Estrogen receptor positive status [ER+]: Secondary | ICD-10-CM | POA: Insufficient documentation

## 2020-11-19 DIAGNOSIS — R922 Inconclusive mammogram: Secondary | ICD-10-CM | POA: Diagnosis not present

## 2020-11-20 ENCOUNTER — Ambulatory Visit (INDEPENDENT_AMBULATORY_CARE_PROVIDER_SITE_OTHER): Payer: PPO

## 2020-11-20 VITALS — Ht 65.0 in | Wt 220.0 lb

## 2020-11-20 DIAGNOSIS — Z0000 Encounter for general adult medical examination without abnormal findings: Secondary | ICD-10-CM | POA: Diagnosis not present

## 2020-11-20 NOTE — Patient Instructions (Addendum)
[name] , Thank you for taking time to come for your Medicare Wellness Visit. I appreciate your ongoing commitment to your health goals. Please review the following plan we discussed and let me know if I can assist you in the future.   These are the goals we discussed: Goals      Patient Stated   .  Stay active (pt-stated)       This is a list of the screening recommended for you and due dates:  Health Maintenance  Topic Date Due  . COVID-19 Vaccine (3 - Moderna risk 4-dose series) 06/04/2020  .  Hepatitis C: One time screening is recommended by Center for Disease Control  (CDC) for  adults born from [birth year] through 1965.   11/20/2021*  . Colon Cancer Screening  05/22/2021  . Tetanus Vaccine  08/17/2026  . DEXA scan (bone density measurement)  Completed  . Pneumonia vaccines  Completed  . Flu Shot  Discontinued  *Topic was postponed. The date shown is not the original due date.    Immunizations Immunization History  Administered Date(s) Administered  . Pneumococcal Conjugate-13 04/15/2015  . Pneumococcal Polysaccharide-23 07/25/2013  . Tdap 08/17/2016  . Zoster 07/27/2011   Keep all routine maintenance appointments.   Follow up 12/04/20 @ 10:30  Advanced directives: on file  Conditions/risks identified: none new  Follow up in one year for your annual wellness visit.   Preventive Care 29 Years and Older, Female Preventive care refers to lifestyle choices and visits with your health care provider that can promote health and wellness. What does preventive care include?  A yearly physical exam. This is also called an annual well check.  Dental exams once or twice a year.  Routine eye exams. Ask your health care provider how often you should have your eyes checked.  Personal lifestyle choices, including:  Daily care of your teeth and gums.  Regular physical activity.  Eating a healthy diet.  Avoiding tobacco and drug use.  Limiting alcohol use.  Practicing  safe sex.  Taking low-dose aspirin every day.  Taking vitamin and mineral supplements as recommended by your health care provider. What happens during an annual well check? The services and screenings done by your health care provider during your annual well check will depend on your age, overall health, lifestyle risk factors, and family history of disease. Counseling  Your health care provider may ask you questions about your:  Alcohol use.  Tobacco use.  Drug use.  Emotional well-being.  Home and relationship well-being.  Sexual activity.  Eating habits.  History of falls.  Memory and ability to understand (cognition).  Work and work Statistician.  Reproductive health. Screening  You may have the following tests or measurements:  Height, weight, and BMI.  Blood pressure.  Lipid and cholesterol levels. These may be checked every 5 years, or more frequently if you are over 3 years old.  Skin check.  Lung cancer screening. You may have this screening every year starting at age 39 if you have a 30-pack-year history of smoking and currently smoke or have quit within the past 15 years.  Fecal occult blood test (FOBT) of the stool. You may have this test every year starting at age 15.  Flexible sigmoidoscopy or colonoscopy. You may have a sigmoidoscopy every 5 years or a colonoscopy every 10 years starting at age 33.  Hepatitis C blood test.  Hepatitis B blood test.  Sexually transmitted disease (STD) testing.  Diabetes screening. This is  done by checking your blood sugar (glucose) after you have not eaten for a while (fasting). You may have this done every 1-3 years.  Bone density scan. This is done to screen for osteoporosis. You may have this done starting at age 10.  Mammogram. This may be done every 1-2 years. Talk to your health care provider about how often you should have regular mammograms. Talk with your health care provider about your test results,  treatment options, and if necessary, the need for more tests. Vaccines  Your health care provider may recommend certain vaccines, such as:  Influenza vaccine. This is recommended every year.  Tetanus, diphtheria, and acellular pertussis (Tdap, Td) vaccine. You may need a Td booster every 10 years.  Zoster vaccine. You may need this after age 55.  Pneumococcal 13-valent conjugate (PCV13) vaccine. One dose is recommended after age 4.  Pneumococcal polysaccharide (PPSV23) vaccine. One dose is recommended after age 72. Talk to your health care provider about which screenings and vaccines you need and how often you need them. This information is not intended to replace advice given to you by your health care provider. Make sure you discuss any questions you have with your health care provider. Document Released: 11/27/2015 Document Revised: 07/20/2016 Document Reviewed: 09/01/2015 Elsevier Interactive Patient Education  2017 [employer] Prevention in the Home Falls can cause injuries. They can happen to people of all ages. There are many things you can do to make your home safe and to help prevent falls. What can I do on the outside of my home?  Regularly fix the edges of walkways and driveways and fix any cracks.  Remove anything that might make you trip as you walk through a door, such as a raised step or threshold.  Trim any bushes or trees on the path to your home.  Use bright outdoor lighting.  Clear any walking paths of anything that might make someone trip, such as rocks or tools.  Regularly check to see if handrails are loose or broken. Make sure that both sides of any steps have handrails.  Any raised decks and porches should have guardrails on the edges.  Have any leaves, snow, or ice cleared regularly.  Use sand or salt on walking paths during winter.  Clean up any spills in your garage right away. This includes oil or grease spills. What can I do in the  bathroom?  Use night lights.  Install grab bars by the toilet and in the tub and shower. Do not use towel bars as grab bars.  Use non-skid mats or decals in the tub or shower.  If you need to sit down in the shower, use a plastic, non-slip stool.  Keep the floor dry. Clean up any water that spills on the floor as soon as it happens.  Remove soap buildup in the tub or shower regularly.  Attach bath mats securely with double-sided non-slip rug tape.  Do not have throw rugs and other things on the floor that can make you trip. What can I do in the bedroom?  Use night lights.  Make sure that you have a light by your bed that is easy to reach.  Do not use any sheets or blankets that are too big for your bed. They should not hang down onto the floor.  Have a firm chair that has side arms. You can use this for support while you get dressed.  Do not have throw rugs and other things  on the floor that can make you trip. What can I do in the kitchen?  Clean up any spills right away.  Avoid walking on wet floors.  Keep items that you use a lot in easy-to-reach places.  If you need to reach something above you, use a strong step stool that has a grab bar.  Keep electrical cords out of the way.  Do not use floor polish or wax that makes floors slippery. If you must use wax, use non-skid floor wax.  Do not have throw rugs and other things on the floor that can make you trip. What can I do with my stairs?  Do not leave any items on the stairs.  Make sure that there are handrails on both sides of the stairs and use them. Fix handrails that are broken or loose. Make sure that handrails are as long as the stairways.  Check any carpeting to make sure that it is firmly attached to the stairs. Fix any carpet that is loose or worn.  Avoid having throw rugs at the top or bottom of the stairs. If you do have throw rugs, attach them to the floor with carpet tape.  Make sure that you have a  light switch at the top of the stairs and the bottom of the stairs. If you do not have them, ask someone to add them for you. What else can I do to help prevent falls?  Wear shoes that:  Do not have high heels.  Have rubber bottoms.  Are comfortable and fit you well.  Are closed at the toe. Do not wear sandals.  If you use a stepladder:  Make sure that it is fully opened. Do not climb a closed stepladder.  Make sure that both sides of the stepladder are locked into place.  Ask someone to hold it for you, if possible.  Clearly mark and make sure that you can see:  Any grab bars or handrails.  First and last steps.  Where the edge of each step is.  Use tools that help you move around (mobility aids) if they are needed. These include:  Canes.  Walkers.  Scooters.  Crutches.  Turn on the lights when you go into a dark area. Replace any light bulbs as soon as they burn out.  Set up your furniture so you have a clear path. Avoid moving your furniture around.  If any of your floors are uneven, fix them.  If there are any pets around you, be aware of where they are.  Review your medicines with your doctor. Some medicines can make you feel dizzy. This can increase your chance of falling. Ask your doctor what other things that you can do to help prevent falls. This information is not intended to replace advice given to you by your health care provider. Make sure you discuss any questions you have with your health care provider. Document Released: 08/27/2009 Document Revised: 04/07/2016 Document Reviewed: 12/05/2014 Elsevier Interactive Patient Education  2017 [employer].

## 2020-11-20 NOTE — Progress Notes (Addendum)
Subjective:   [name] is a 80 y.o. female who presents for Medicare Annual (Subsequent) preventive examination.  Review of Systems    No ROS.  Medicare Wellness Virtual Visit.   Cardiac Risk Factors include: advanced age (>79men, >31 women);hypertension     Objective:    Today's Vitals   11/20/20 1437  Weight: 220 lb (99.8 kg)  Height: 5\' 5"  (1.651 m)   Body mass index is 36.61 kg/m.  Advanced Directives 11/20/2020 10/27/2020 10/12/2020 07/10/2020 04/02/2020 02/14/2020 02/14/2020  Does Patient Have a Medical Advance Directive? Yes Yes Yes Yes Yes - Yes  Type of Advance Directive [place];Living will [place];Living will [place];Living will Healthcare Power of [place];Living will - [place];Living will  Does patient want to make changes to medical advance directive? No - Patient declined No - Patient declined Yes (ED - Information included in AVS) - - No - Patient declined -  Copy of [place] in Chart? Yes - validated most recent copy scanned in chart (See row information) No - copy requested - Yes - validated most recent copy scanned in chart (See row information) - - No - copy requested  Would patient like information on creating a medical advance directive? - - - - - - -    Current Medications (verified) Outpatient Encounter Medications as of 11/20/2020  Medication Sig  . anastrozole (ARIMIDEX) 1 MG tablet Take 1 tablet (1 mg total) by mouth daily.  Marland Kitchen losartan (COZAAR) 50 MG tablet Take 1 tablet (50 mg total) by mouth daily. (Patient taking differently: Take 50 mg by mouth every morning.)  . Multiple Vitamin (MULTIVITAMIN WITH MINERALS) TABS tablet Take 1 tablet by mouth daily.  . Multiple Vitamins-Minerals (ICAPS AREDS 2 PO) Take 1 capsule by mouth 2 (two) times daily.   . phenylephrine (SUDAFED PE) 10 MG TABS tablet Take 10 mg by mouth every 4 (four)  hours as needed (for allergies/congestion.).   Marland Kitchen primidone (MYSOLINE) 50 MG tablet Take 50 mg by mouth 2 (two) times daily.  . propranolol ER (INDERAL LA) 160 MG SR capsule Take by mouth.  . rivaroxaban (XARELTO) 10 MG TABS tablet Take 1 tablet (10 mg total) by mouth daily.  Marland Kitchen topiramate (TOPAMAX) 100 MG tablet Take by mouth 2 (two) times daily.   . [DISCONTINUED] prochlorperazine (COMPAZINE) 10 MG tablet Take 1 tablet (10 mg total) by mouth every 6 (six) hours as needed (Nausea or vomiting). (Patient not taking: Reported on 01/28/2019)   No facility-administered encounter medications on file as of 11/20/2020.    Allergies (verified) Oxycodone and Codeine   History: Past Medical History:  Diagnosis Date  . Arthritis    bitateral knees and left foot,s/p cortisone injection in past  . Chronic constipation   . Colon cancer ([place]) 2018   pt is undergoing chemo 11-09-17  . Complication of anesthesia     slow to wake up   . Endometrial cancer ([place]) 2014   Total Hysterectomy and Rad tx's.   . Family history of cancer   . Genetic testing 09/26/2017    Multi-Cancer panel (83 genes) @ [facility] - No pathogenic mutations detected  . Hypertension   . IBS (irritable bowel syndrome)   . Macular degeneration of both eyes   . Menopause    age 45  . PONV (postoperative nausea and vomiting)   . Vitamin D deficiency 06/07/10   Past Surgical  History:  Procedure Laterality Date  . BREAST BIOPSY Right 12/04/2019   [place] bx-INVASIVE MAMMARY CARCINOMA  . BREAST BIOPSY Left 01/31/2020   fibercystic changes  . BREAST LUMPECTOMY Right 01/03/2020  . BREAST LUMPECTOMY WITH NEEDLE LOCALIZATION AND AXILLARY SENTINEL LYMPH NODE BX Right 01/03/2020   Procedure: BREAST LUMPECTOMY WITH NEEDLE LOCALIZATION AND AXILLARY SENTINEL LYMPH NODE BX;  Surgeon: Fredirick Maudlin, MD;  Location: [facility];  Service: General;  Laterality: Right;  . BUNIONECTOMY    . CATARACT EXTRACTION W/PHACO Left 09/24/2018   Procedure:  CATARACT EXTRACTION PHACO AND INTRAOCULAR LENS PLACEMENT ([place]) LEFT;  Surgeon: Eulogio Bear, MD;  Location: [place];  Service: Ophthalmology;  Laterality: Left;  . CATARACT EXTRACTION W/PHACO Right 10/15/2018   Procedure: CATARACT EXTRACTION PHACO AND INTRAOCULAR LENS PLACEMENT ([place]) RIGHT;  Surgeon: Eulogio Bear, MD;  Location: [place];  Service: Ophthalmology;  Laterality: Right;  . cataract surgery     both eyes  . COLONOSCOPY WITH PROPOFOL N/A 04/04/2017   Procedure: COLONOSCOPY WITH PROPOFOL;  Surgeon: [name], MD;  Location: [facility] ENDOSCOPY;  Service: Endoscopy;  Laterality: N/A;  . COLONOSCOPY WITH PROPOFOL N/A 05/22/2018   Procedure: COLONOSCOPY WITH PROPOFOL;  Surgeon: [name], MD;  Location[facility] ENDOSCOPY;  Service: Endoscopy;  Laterality: N/A;  . DILATION AND CURETTAGE OF UTERUS  1971  . FOOT SURGERY    . MASTECTOMY Right 02/14/2020  . NASAL SINUS SURGERY    . PORT-A-CATH REMOVAL Right 02/14/2020   Procedure: REMOVAL PORT-A-CATH;  Surgeon: Fredirick Maudlin, MD;  Location: [facility];  Service: General;  Laterality: Right;  . PORTACATH PLACEMENT Right 07/12/2017   Procedure: INSERTION PORT-A-CATH;  Surgeon: Jules Husbands, MD;  Location: [facility];  Service: General;  Laterality: Right;  . ROBOTIC ASSISTED LAPAROSCOPIC VENTRAL/INCISIONAL HERNIA REPAIR N/A 06/07/2017   Procedure: ROBOTIC INCISIONAL HERNIA REPAIR;  Surgeon: Leighton Ruff, MD;  Location: [facility];  Service: General;  Laterality: N/A;  . SIMPLE MASTECTOMY WITH AXILLARY SENTINEL NODE BIOPSY Right 02/14/2020   Procedure: Right SIMPLE MASTECTOMY;  Surgeon: Fredirick Maudlin, MD;  Location: [facility];  Service: General;  Laterality: Right;  Marland Kitchen VAGINAL DELIVERY    . VAGINAL HYSTERECTOMY     Family History  Problem Relation Age of Onset  . Heart disease Father 60       massive MI  . Diabetes Father   . Bipolar disorder Sister   . Osteoporosis Sister   . Breast cancer Sister 32        currently 46  . Cancer Sister        breast  . Bipolar disorder Maternal Aunt   . Breast cancer Maternal Aunt        age at dx unknown; deceased 11s  . Colon cancer Maternal Grandfather 62       deceased 81  . Cancer Maternal Grandfather        breast  . Osteoporosis Mother   . Colon cancer Mother        age at dx unknown; deceased 36  . Cancer Maternal Grandmother        unk. type; deceased 76  . Breast cancer Paternal Aunt        deceased 29   Social History   Socioeconomic History  . Marital status: Divorced    Spouse name: Not on file  . Number of children: Not on file  . Years of education: Not on file  . Highest education level: Not on file  Occupational History  .  Not on file  Tobacco Use  . Smoking status: Never Smoker  . Smokeless tobacco: Never Used  Vaping Use  . Vaping Use: Never used  Substance and Sexual Activity  . Alcohol use: Yes    Comment: rarely - Holidays  . Drug use: No  . Sexual activity: Never  Other Topics Concern  . Not on file  Social History Narrative   Exercise- aerobic exercise at home 3-4x week.       Lives in [place]. No pets.   Social Determinants of Health   Financial Resource Strain: Low Risk   . Difficulty of Paying Living Expenses: Not hard at all  Food Insecurity: No Food Insecurity  . Worried About Programme researcher, broadcasting/film/video in the Last Year: Never true  . Ran Out of Food in the Last Year: Never true  Transportation Needs: No Transportation Needs  . Lack of Transportation (Medical): No  . Lack of Transportation (Non-Medical): No  Physical Activity: Unknown  . Days of Exercise per Week: 0 days  . Minutes of Exercise per Session: Not on file  Stress: No Stress Concern Present  . Feeling of Stress : Not at all  Social Connections: Unknown  . Frequency of Communication with Friends and Family: More than three times a week  . Frequency of Social Gatherings with Friends and Family: More than three times a week  . Attends  Religious Services: 1 to 4 times per year  . Active Member of Clubs or Organizations: Yes  . Attends Banker Meetings: 1 to 4 times per year  . Marital Status: Not on file    Tobacco Counseling Counseling given: Not Answered   Clinical Intake:  Pre-visit preparation completed: Yes        Diabetes: No  How often do you need to have someone help you when you read instructions, pamphlets, or other written materials from your doctor or pharmacy?: 1 - Never   Interpreter Needed?: No      Activities of Daily Living In your present state of health, do you have any difficulty performing the following activities: 11/20/2020 02/14/2020  Hearing? Y -  Comment Hearing aids -  Vision? N -  Difficulty concentrating or making decisions? N -  Walking or climbing stairs? N -  Dressing or bathing? N -  Doing errands, shopping? N N  Preparing Food and eating ? N -  Using the Toilet? N -  In the past six months, have you accidently leaked urine? N -  Do you have problems with loss of bowel control? N -  Managing your Medications? N -  Managing your Finances? N -  Housekeeping or managing your Housekeeping? N -  Some recent data might be hidden    Patient Care Team: Allegra Grana, FNP as PCP - General (Family Medicine) Lonell Face, MD (Neurology) Carmina Miller, MD as Referring Physician (Radiation Oncology) Romie Levee, MD as Consulting Physician (General Surgery) Rickard Patience, MD as Consulting Physician (Oncology) Leafy Ro, MD as Consulting Physician (General Surgery) Jim Like, RN as Registered Nurse Scarlett Presto, RN as Registered Nurse Duanne Guess, MD as Consulting Physician (General Surgery)  Indicate any recent Medical Services you may have received from other than [facility] providers in the past year (date may be approximate).     Assessment:   This is a routine wellness examination for [place].  Unsuccessful attempt to connect with  patient from the start.  I connected with Bonita Quin today  by telephone and verified that I am speaking with the correct person using two identifiers. Location patient: home Location provider: work Persons participating in the virtual visit: patient, Engineer, civil (consulting).    I discussed the limitations, risks, security and privacy concerns of performing an evaluation and management service by telephone and the availability of in person appointments. The patient expressed understanding and verbally consented to this telephonic visit.    Interactive audio and video telecommunications were attempted between this provider and patient, however failed, due to patient having technical difficulties OR patient did not have access to video capability.  We continued and completed visit with audio only.  Some vital signs may be absent or patient reported.   Hearing/Vision screen  Hearing Screening   125Hz  250Hz  500Hz  1000Hz  2000Hz  3000Hz  4000Hz  6000Hz  8000Hz   Right ear:           Left ear:           Comments: Hearing aids  Vision Screening Comments: Wears corrective lenses  Cataract extraction, bilateral  Macular degeneration  Visual acuity not assessed, virtual visit. They have seen their ophthalmologist in the last 4 months.   Dietary issues and exercise activities discussed: Current Exercise Habits: The patient does not participate in regular exercise at present, Type of exercise: walking, Intensity: Mild  Goals      Patient Stated   .  Stay active (pt-stated)      Depression Screen PHQ 2/9 Scores 11/20/2020 11/20/2019 11/16/2018 04/19/2018 11/15/2017 02/14/2017 11/15/2016  PHQ - 2 Score 0 0 0 0 1 0 0  PHQ- 9 Score - - - - 2 - -    Fall Risk Fall Risk  11/20/2020 02/27/2020 01/16/2020 12/10/2019 11/20/2019  Falls in the past year? 0 0 0 0 0  Number falls in past yr: 0 - 0 0 -  Injury with Fall? 0 - - 0 -  Follow up Falls evaluation completed - - - Falls prevention discussed    FALL RISK PREVENTION PERTAINING TO THE  HOME: Handrails in use when climbing stairs? Yes Home free of loose throw rugs in walkways, pet beds, electrical cords, etc? Yes  Adequate lighting in your home to reduce risk of falls? Yes   ASSISTIVE DEVICES UTILIZED TO PREVENT FALLS: Life alert? No  Use of a cane, walker or w/c? No  Grab bars in the bathroom? No  Shower chair or bench in shower? No  Elevated toilet seat or a handicapped toilet? No   TIMED UP AND GO:  Was the test performed? No . Virtual visit.   Cognitive Function: MMSE - Mini Mental State Exam 11/15/2017 04/15/2015  Orientation to time 5 5  Orientation to Place 5 5  Registration 3 3  Attention/ Calculation 5 5  Recall 2 3  Language- name 2 objects 2 2  Language- repeat 1 1  Language- follow 3 step command 3 3  Language- read & follow direction 1 1  Language-read & follow direction-comments - Patient loves to read.  Write a sentence 1 1  Copy design 1 1  Total score 29 30     6CIT Screen 11/20/2020 11/20/2019 11/16/2018 11/15/2016  What Year? 0 points 0 points 0 points 0 points  What month? 0 points 0 points 0 points 0 points  What time? 0 points 0 points 0 points 0 points  Count back from 20 0 points 0 points 0 points 0 points  Months in reverse 0 points 0 points 0 points 0 points  Repeat phrase  0 points 0 points 0 points 0 points  Total Score 0 0 0 0    Immunizations Immunization History  Administered Date(s) Administered  . Moderna Sars-Covid-2 Vaccination 04/09/2020, 05/07/2020  . Pneumococcal Conjugate-13 04/15/2015  . Pneumococcal Polysaccharide-23 07/25/2013  . Tdap 08/17/2016  . Zoster 07/27/2011   Covid Booster- Not yet received.   Health Maintenance Health Maintenance  Topic Date Due  . COVID-19 Vaccine (3 - Moderna risk 4-dose series) 06/04/2020  . Hepatitis C Screening  11/20/2021 (Originally February 08, 1941)  . COLONOSCOPY (Pts 45-26yrs Insurance coverage will need to be confirmed)  05/22/2021  . TETANUS/TDAP  08/17/2026  . DEXA SCAN   Completed  . PNA vac Low Risk Adult  Completed  . INFLUENZA VACCINE  Discontinued   Colorectal cancer screening: Type of screening: Colonoscopy. Completed 05/22/18. Repeat every 3 years  Mammogram status: Completed 11/20/19. Repeat every year  Bone Density status: Completed 02/20/20. Results reflect: Bone density results: OSTEOPENIA. Repeat every 2 years.   Lung Cancer Screening: (Low Dose CT Chest recommended if Age 71-80 years, 30 pack-year currently smoking OR have quit w/in 15years.) does not qualify.   Hepatitis C Screening: does not qualify. Deferred.   Vision Screening: Recommended annual ophthalmology exams for early detection of glaucoma and other disorders of the eye. Is the patient up to date with their annual eye exam?  Yes  Who is the provider or what is the name of the office in which the patient attends annual eye exams? [facility]. Visits every 4 months. Macular degeneration.   Dental Screening: Recommended annual dental exams for proper oral hygiene.  Community Resource Referral / Chronic Care Management: CRR required this visit?  No   CCM required this visit?  No      Plan:   Keep all routine maintenance appointments.   Follow up 12/04/20 @ 10:30  I have personally reviewed and noted the following in the patient's chart:   . Medical and social history . Use of alcohol, tobacco or illicit drugs  . Current medications and supplements . Functional ability and status . Nutritional status . Physical activity . Advanced directives . List of other physicians . Hospitalizations, surgeries, and ER visits in previous 12 months . Vitals . Screenings to include cognitive, depression, and falls . Referrals and appointments  In addition, I have reviewed and discussed with patient certain preventive protocols, quality metrics, and best practice recommendations. A written personalized care plan for preventive services as well as general preventive health  recommendations were provided to patient via mychart.     Varney Biles, LPN   02/15/101    Agree with plan. [name], NP

## 2020-12-04 ENCOUNTER — Encounter: Payer: Self-pay | Admitting: Family

## 2020-12-04 ENCOUNTER — Telehealth (INDEPENDENT_AMBULATORY_CARE_PROVIDER_SITE_OTHER): Payer: PPO | Admitting: Family

## 2020-12-04 ENCOUNTER — Ambulatory Visit: Payer: PPO | Admitting: Family

## 2020-12-04 ENCOUNTER — Other Ambulatory Visit: Payer: Self-pay

## 2020-12-04 VITALS — Ht 65.0 in | Wt 222.0 lb

## 2020-12-04 DIAGNOSIS — I700 Atherosclerosis of aorta: Secondary | ICD-10-CM

## 2020-12-04 DIAGNOSIS — I10 Essential (primary) hypertension: Secondary | ICD-10-CM

## 2020-12-04 NOTE — Assessment & Plan Note (Signed)
Stable per chart review. Advised to check blood pressure at home and send me readings. Continue losartan 50mg .

## 2020-12-04 NOTE — Progress Notes (Signed)
Virtual Visit via Video Note  I connected with@  on 12/04/20 at 12:30 PM EST by a video enabled telemedicine application and verified that I am speaking with the correct person using two identifiers.  Location patient: home Location provider:work  Persons participating in the virtual visit: patient, provider  I discussed the limitations of evaluation and management by telemedicine and the availability of in person appointments. The patient expressed understanding and agreed to proceed.   HPI: Atherosclerotic disease recent CT A/P 07/2020  HTN- compliant with losartan  Tremor- following with neurology . Compliant with propranolol, primidone.   RLL h.o DVT- compliant with xarelto H/o colon cancer and right breast cancer s/p mastectomy- follows with Dr Tasia Catchings . No longer following [facility]   ROS: See pertinent positives and negatives per HPI.    EXAM:  VITALS per patient if applicable: Ht 5\' 5"  (1.651 m)   Wt 222 lb (100.7 kg)   BMI 36.94 kg/m  BP Readings from Last 3 Encounters:  10/28/20 (!) 136/56  10/12/20 120/61  07/10/20 (!) 144/71   Wt Readings from Last 3 Encounters:  12/04/20 222 lb (100.7 kg)  11/20/20 220 lb (99.8 kg)  10/28/20 220 lb 4.8 oz (99.9 kg)    GENERAL: alert, oriented, appears well and in no acute distress  HEENT: atraumatic, conjunttiva clear, no obvious abnormalities on inspection of external nose and ears  NECK: normal movements of the head and neck  LUNGS: on inspection no signs of respiratory distress, breathing rate appears normal, no obvious gross SOB, gasping or wheezing  CV: no obvious cyanosis  MS: moves all visible extremities without noticeable abnormality  PSYCH/NEURO: pleasant and cooperative, no obvious depression or anxiety, speech and thought processing grossly intact  ASSESSMENT AND PLAN:  Discussed the following assessment and plan:  Problem List Items Addressed This Visit      Cardiovascular and Mediastinum    Atherosclerosis of aorta ([place]) - Primary    Discussed atherosclerosis seen on recent CT and dates back to 2018 in prior imaging. Discussed family risk ( father) as well. Advised low dose statin. Patient declines at this time and would like to see lipid panel prior to starting medication which is very reasonable.       Relevant Orders   Lipid panel   Essential hypertension, benign    Stable per chart review. Advised to check blood pressure at home and send me readings. Continue losartan 50mg .         -we discussed possible serious and likely etiologies, options for evaluation and workup, limitations of telemedicine visit vs in person visit, treatment, treatment risks and precautions. Pt prefers to treat via telemedicine empirically rather then risking or undertaking an in person visit at this moment.  .   I discussed the assessment and treatment plan with the patient. The patient was provided an opportunity to ask questions and all were answered. The patient agreed with the plan and demonstrated an understanding of the instructions.   The patient was advised to call back or seek an in-person evaluation if the symptoms worsen or if the condition fails to improve as anticipated.   [name], FNP

## 2020-12-04 NOTE — Assessment & Plan Note (Signed)
Discussed atherosclerosis seen on recent CT and dates back to 2018 in prior imaging. Discussed family risk ( father) as well. Advised low dose statin. Patient declines at this time and would like to see lipid panel prior to starting medication which is very reasonable.

## 2020-12-28 ENCOUNTER — Telehealth: Payer: Self-pay | Admitting: Family

## 2020-12-29 ENCOUNTER — Encounter: Payer: Self-pay | Admitting: Internal Medicine

## 2020-12-29 ENCOUNTER — Ambulatory Visit (INDEPENDENT_AMBULATORY_CARE_PROVIDER_SITE_OTHER): Payer: PPO | Admitting: Internal Medicine

## 2020-12-29 ENCOUNTER — Other Ambulatory Visit: Payer: Self-pay

## 2020-12-29 VITALS — BP 134/80 | HR 82 | Temp 97.7°F | Ht 65.0 in | Wt 222.4 lb

## 2020-12-29 DIAGNOSIS — L03114 Cellulitis of left upper limb: Secondary | ICD-10-CM | POA: Diagnosis not present

## 2020-12-29 MED ORDER — DOXYCYCLINE HYCLATE 100 MG PO TABS: 100.0000 mg | ORAL_TABLET | Freq: Two times a day (BID) | ORAL | 0 refills | Status: DC

## 2020-12-29 MED ORDER — MUPIROCIN 2 % EX OINT: 1.0000 "application " | TOPICAL_OINTMENT | Freq: Three times a day (TID) | CUTANEOUS | 0 refills | Status: DC

## 2020-12-29 NOTE — Patient Instructions (Signed)
Dove antibacterial soap or chlorhexadine or hibiclins   Cellulitis, Adult  Cellulitis is a skin infection. The infected area is usually warm, red, swollen, and tender. This condition occurs most often in the arms and lower legs. The infection can travel to the muscles, blood, and underlying tissue and become serious. It is very important to get treated for this condition. What are the causes? Cellulitis is caused by bacteria. The bacteria enter through a break in the skin, such as a cut, burn, insect bite, open sore, or crack. What increases the risk? This condition is more likely to occur in people who:  Have a weak body defense system (immune system).  Have open wounds on the skin, such as cuts, burns, bites, and scrapes. Bacteria can enter the body through these open wounds.  Are older than 80 years of age.  Have diabetes.  Have a type of long-lasting (chronic) liver disease (cirrhosis) or kidney disease.  Are obese.  Have a skin condition such as: ? Itchy rash (eczema). ? Slow movement of blood in the veins (venous stasis). ? Fluid buildup below the skin (edema).  Have had radiation therapy.  Use IV drugs. What are the signs or symptoms? Symptoms of this condition include:  Redness, streaking, or spotting on the skin.  Swollen area of the skin.  Tenderness or pain when an area of the skin is touched.  Warm skin.  A fever.  Chills.  Blisters. How is this diagnosed? This condition is diagnosed based on a medical history and physical exam. You may also have tests, including:  Blood tests.  Imaging tests. How is this treated? Treatment for this condition may include:  Medicines, such as antibiotic medicines or medicines to treat allergies (antihistamines).  Supportive care, such as rest and application of cold or warm cloths (compresses) to the skin.  Hospital care, if the condition is severe. The infection usually starts to get better within 1-2 days of  treatment. Follow these instructions at home: Medicines  Take over-the-counter and prescription medicines only as told by your health care provider.  If you were prescribed an antibiotic medicine, take it as told by your health care provider. Do not stop taking the antibiotic even if you start to feel better. General instructions  Drink enough fluid to keep your urine pale yellow.  Do not touch or rub the infected area.  Raise (elevate) the infected area above the level of your heart while you are sitting or lying down.  Apply warm or cold compresses to the affected area as told by your health care provider.  Keep all follow-up visits as told by your health care provider. This is important. These visits let your health care provider make sure a more serious infection is not developing.   Contact a health care provider if:  You have a fever.  Your symptoms do not begin to improve within 1-2 days of starting treatment.  Your bone or joint underneath the infected area becomes painful after the skin has healed.  Your infection returns in the same area or another area.  You notice a swollen bump in the infected area.  You develop new symptoms.  You have a general ill feeling (malaise) with muscle aches and pains. Get help right away if:  Your symptoms get worse.  You feel very sleepy.  You develop vomiting or diarrhea that persists.  You notice red streaks coming from the infected area.  Your red area gets larger or turns dark in color.  These symptoms may represent a serious problem that is an emergency. Do not wait to see if the symptoms will go away. Get medical help right away. Call your local emergency services (911 in the [place] Do not drive yourself to the hospital. Summary  Cellulitis is a skin infection. This condition occurs most often in the arms and lower legs.  Treatment for this condition may include medicines, such as antibiotic medicines or  antihistamines.  Take over-the-counter and prescription medicines only as told by your health care provider. If you were prescribed an antibiotic medicine, do not stop taking the antibiotic even if you start to feel better.  Contact a health care provider if your symptoms do not begin to improve within 1-2 days of starting treatment or your symptoms get worse.  Keep all follow-up visits as told by your health care provider. This is important. These visits let your health care provider make sure that a more serious infection is not developing. This information is not intended to replace advice given to you by your health care provider. Make sure you discuss any questions you have with your health care provider. Document Revised: 11/11/2019 Document Reviewed: 03/22/2018 Elsevier Patient Education  [place].

## 2020-12-29 NOTE — Progress Notes (Signed)
Patient presenting with left hand ring finger swelling and pain. States that this past Saturday she was out clearing leaves on her porch. Thinks something must have bit her.   Patient states Saturday night the left ring finger at the base started to swell, discoloration, and pain. States she was able to drain a milky white substance from the finger.

## 2020-12-29 NOTE — Progress Notes (Signed)
Chief Complaint  Patient presents with  . Finger Injury   Acute visit  On patio Saturday removing leaves and few hours later had swelling left ring finger and red rosy balloon which she used sterile needle to pop and hand is throbbing not sure if had bite while removing leaves. Hand is red, swollen, warm and drained a milky drainage in the area of blister    Review of Systems  Respiratory: Negative for shortness of breath.   Cardiovascular: Negative for chest pain.  Skin: Positive for rash.   Past Medical History:  Diagnosis Date  . Arthritis    bitateral knees and left foot,s/p cortisone injection in past  . Chronic constipation   . Colon cancer ([place]) 2018   pt is undergoing chemo 11-09-17  . Complication of anesthesia     slow to wake up   . Endometrial cancer ([place]) 2014   Total Hysterectomy and Rad tx's.   . Family history of cancer   . Genetic testing 09/26/2017    Multi-Cancer panel (83 genes) @ [facility] - No pathogenic mutations detected  . Hypertension   . IBS (irritable bowel syndrome)   . Macular degeneration of both eyes   . Menopause    age 24  . PONV (postoperative nausea and vomiting)   . Vitamin D deficiency 06/07/10   Past Surgical History:  Procedure Laterality Date  . BREAST BIOPSY Right 12/04/2019   [place] bx-INVASIVE MAMMARY CARCINOMA  . BREAST BIOPSY Left 01/31/2020   fibercystic changes  . BREAST LUMPECTOMY Right 01/03/2020  . BREAST LUMPECTOMY WITH NEEDLE LOCALIZATION AND AXILLARY SENTINEL LYMPH NODE BX Right 01/03/2020   Procedure: BREAST LUMPECTOMY WITH NEEDLE LOCALIZATION AND AXILLARY SENTINEL LYMPH NODE BX;  Surgeon: Fredirick Maudlin, MD;  Location: [facility];  Service: General;  Laterality: Right;  . BUNIONECTOMY    . CATARACT EXTRACTION W/PHACO Left 09/24/2018   Procedure: CATARACT EXTRACTION PHACO AND INTRAOCULAR LENS PLACEMENT ([place]) LEFT;  Surgeon: Eulogio Bear, MD;  Location: [place];  Service: Ophthalmology;  Laterality: Left;   . CATARACT EXTRACTION W/PHACO Right 10/15/2018   Procedure: CATARACT EXTRACTION PHACO AND INTRAOCULAR LENS PLACEMENT ([place]) RIGHT;  Surgeon: Eulogio Bear, MD;  Location: [place];  Service: Ophthalmology;  Laterality: Right;  . cataract surgery     both eyes  . COLONOSCOPY WITH PROPOFOL N/A 04/04/2017   Procedure: COLONOSCOPY WITH PROPOFOL;  Surgeon: [name], MD;  Location: [facility]COPY;  Service: Endoscopy;  Laterality: N/A;  . COLONOSCOPY WITH PROPOFOL N/A 05/22/2018   Procedure: COLONOSCOPY WITH PROPOFOL;  Surgeon: [name], MD;  Location: [facility] ENDOSCOPY;  Service: Endoscopy;  Laterality: N/A;  . DILATION AND CURETTAGE OF UTERUS  1971  . FOOT SURGERY    . MASTECTOMY Right 02/14/2020  . NASAL SINUS SURGERY    . PORT-A-CATH REMOVAL Right 02/14/2020   Procedure: REMOVAL PORT-A-CATH;  Surgeon: Fredirick Maudlin, MD;  Location: [facility];  Service: General;  Laterality: Right;  . PORTACATH PLACEMENT Right 07/12/2017   Procedure: INSERTION PORT-A-CATH;  Surgeon: Jules Husbands, MD;  Location: [facility];  Service: General;  Laterality: Right;  . ROBOTIC ASSISTED LAPAROSCOPIC VENTRAL/INCISIONAL HERNIA REPAIR N/A 06/07/2017   Procedure: ROBOTIC INCISIONAL HERNIA REPAIR;  Surgeon: Leighton Ruff, MD;  [facility];  Service: General;  Laterality: N/A;  . SIMPLE MASTECTOMY WITH AXILLARY SENTINEL NODE BIOPSY Right 02/14/2020   Procedure: Right SIMPLE MASTECTOMY;  Surgeon: Fredirick Maudlin, MD;  Location: [facility];  Service: General;  Laterality: Right;  Marland Kitchen VAGINAL  DELIVERY    . VAGINAL HYSTERECTOMY     Family History  Problem Relation Age of Onset  . Diabetes Father   . Heart attack Father 52       massive MI  . Bipolar disorder Sister   . Osteoporosis Sister   . Breast cancer Sister 48       currently 66  . Cancer Sister        breast  . Bipolar disorder Maternal Aunt   . Breast cancer Maternal Aunt        age at dx unknown; deceased 59s  . Colon cancer Maternal  Grandfather 40       deceased 68  . Cancer Maternal Grandfather        breast  . Osteoporosis Mother   . Colon cancer Mother        age at dx unknown; deceased 47  . Cancer Maternal Grandmother        unk. type; deceased 76  . Breast cancer Paternal Aunt        deceased 3   Social History   Socioeconomic History  . Marital status: Divorced    Spouse name: Not on file  . Number of children: Not on file  . Years of education: Not on file  . Highest education level: Not on file  Occupational History  . Not on file  Tobacco Use  . Smoking status: Never Smoker  . Smokeless tobacco: Never Used  Vaping Use  . Vaping Use: Never used  Substance and Sexual Activity  . Alcohol use: Yes    Comment: rarely - Holidays  . Drug use: No  . Sexual activity: Never  Other Topics Concern  . Not on file  Social History Narrative   Exercise- aerobic exercise at home 3-4x week.       Lives in [place]. No pets.   Social Determinants of Health   Financial Resource Strain: Low Risk   . Difficulty of Paying Living Expenses: Not hard at all  Food Insecurity: No Food Insecurity  . Worried About Charity fundraiser in the Last Year: Never true  . Ran Out of Food in the Last Year: Never true  Transportation Needs: No Transportation Needs  . Lack of Transportation (Medical): No  . Lack of Transportation (Non-Medical): No  Physical Activity: Unknown  . Days of Exercise per Week: 0 days  . Minutes of Exercise per Session: Not on file  Stress: No Stress Concern Present  . Feeling of Stress : Not at all  Social Connections: Unknown  . Frequency of Communication with Friends and Family: More than three times a week  . Frequency of Social Gatherings with Friends and Family: More than three times a week  . Attends Religious Services: 1 to 4 times per year  . Active Member of Clubs or Organizations: Yes  . Attends Archivist Meetings: 1 to 4 times per year  . Marital Status: Not on  file  Intimate Partner Violence: Not At Risk  . Fear of Current or Ex-Partner: No  . Emotionally Abused: No  . Physically Abused: No  . Sexually Abused: No   Current Meds  Medication Sig  . anastrozole (ARIMIDEX) 1 MG tablet Take 1 tablet (1 mg total) by mouth daily.  Marland Kitchen doxycycline (VIBRA-TABS) 100 MG tablet Take 1 tablet (100 mg total) by mouth 2 (two) times daily. X 7-10 days with food  . losartan (COZAAR) 50 MG tablet Take 1 tablet (50  mg total) by mouth daily. (Patient taking differently: Take 50 mg by mouth every morning.)  . Multiple Vitamin (MULTIVITAMIN WITH MINERALS) TABS tablet Take 1 tablet by mouth daily.  . Multiple Vitamins-Minerals (ICAPS AREDS 2 PO) Take 1 capsule by mouth 2 (two) times daily.   . mupirocin ointment (BACTROBAN) 2 % Apply 1 application topically 3 (three) times daily. Left hand  . phenylephrine (SUDAFED PE) 10 MG TABS tablet Take 10 mg by mouth every 4 (four) hours as needed (for allergies/congestion.).   Marland Kitchen primidone (MYSOLINE) 50 MG tablet Take 50 mg by mouth 2 (two) times daily.  . propranolol ER (INDERAL LA) 160 MG SR capsule Take by mouth.  . rivaroxaban (XARELTO) 10 MG TABS tablet Take 1 tablet (10 mg total) by mouth daily.  Marland Kitchen topiramate (TOPAMAX) 100 MG tablet Take by mouth 2 (two) times daily.    Allergies  Allergen Reactions  . Oxycodone Nausea And Vomiting  . Codeine Nausea And Vomiting   Recent Results (from the past 2160 hour(s))  CEA     Status: None   Collection Time: 10/12/20  9:17 AM  Result Value Ref Range   CEA 1.0 0.0 - 4.7 ng/mL    Comment: (NOTE)                             Nonsmokers          <3.9                             Smokers             <5.6 [employer] Electrochemiluminescence Immunoassay (ECLIA) Values obtained with different assay methods or kits cannot be used interchangeably.  Results cannot be interpreted as absolute evidence of the presence or absence of malignant disease. Performed At: [facility] [place], [place] [id number] Rush Farmer MD QI:[id number]   Comprehensive metabolic panel     Status: Abnormal   Collection Time: 10/12/20  9:17 AM  Result Value Ref Range   Sodium 140 135 - 145 mmol/L   Potassium 4.2 3.5 - 5.1 mmol/L   Chloride 107 98 - 111 mmol/L   CO2 24 22 - 32 mmol/L   Glucose, Bld 164 (H) 70 - 99 mg/dL    Comment: Glucose reference range applies only to samples taken after fasting for at least 8 hours.   BUN 13 8 - 23 mg/dL   Creatinine, Ser 0.90 0.44 - 1.00 mg/dL   Calcium 9.4 8.9 - 10.3 mg/dL   Total Protein 6.9 6.5 - 8.1 g/dL   Albumin 3.7 3.5 - 5.0 g/dL   AST 22 15 - 41 U/L   ALT 15 0 - 44 U/L   Alkaline Phosphatase 94 38 - 126 U/L   Total Bilirubin 0.5 0.3 - 1.2 mg/dL   GFR, Estimated >60 >60 mL/min    Comment: (NOTE) Calculated using the CKD-EPI Creatinine Equation (2021)    Anion gap 9 5 - 15    Comment: Performed at [facility], [address] [place], [place] [address]  CBC with Differential/Platelet     Status: None   Collection Time: 10/12/20  9:17 AM  Result Value Ref Range   WBC 4.2 4.0 - 10.5 K/uL   RBC 4.05 3.87 - 5.11 MIL/uL   Hemoglobin 12.0 12.0 - 15.0 g/dL   HCT 37.8 36.0 - 46.0 %  MCV 93.3 80.0 - 100.0 fL   MCH 29.6 26.0 - 34.0 pg   MCHC 31.7 30.0 - 36.0 g/dL   RDW 14.5 11.5 - 15.5 %   Platelets 215 150 - 400 K/uL   nRBC 0.0 0.0 - 0.2 %   Neutrophils Relative % 64 %   Neutro Abs 2.7 1.7 - 7.7 K/uL   Lymphocytes Relative 25 %   Lymphs Abs 1.1 0.7 - 4.0 K/uL   Monocytes Relative 6 %   Monocytes Absolute 0.3 0.1 - 1.0 K/uL   Eosinophils Relative 4 %   Eosinophils Absolute 0.2 0.0 - 0.5 K/uL   Basophils Relative 1 %   Basophils Absolute 0.0 0.0 - 0.1 K/uL   Immature Granulocytes 0 %   Abs Immature Granulocytes 0.01 0.00 - 0.07 K/uL    Comment: Performed at [facility], [address] [place], [place] [address]   Objective  Body mass index is 37.01 kg/m. Wt Readings from Last  3 Encounters:  12/29/20 222 lb 6.4 oz (100.9 kg)  12/04/20 222 lb (100.7 kg)  11/20/20 220 lb (99.8 kg)   Temp Readings from Last 3 Encounters:  12/29/20 97.7 F (36.5 C) (Oral)  10/28/20 (!) 95.7 F (35.4 C) (Tympanic)  10/12/20 98 F (36.7 C)   BP Readings from Last 3 Encounters:  12/29/20 134/80  10/28/20 (!) 136/56  10/12/20 120/61   Pulse Readings from Last 3 Encounters:  12/29/20 82  10/28/20 (!) 51  10/12/20 (!) 59    Physical Exam Vitals and nursing note reviewed.  Constitutional:      Appearance: Normal appearance. She is well-developed and well-groomed. She is obese.  HENT:     Head: Normocephalic and atraumatic.  Eyes:     Conjunctiva/sclera: Conjunctivae normal.     Pupils: Pupils are equal, round, and reactive to light.  Cardiovascular:     Rate and Rhythm: Normal rate and regular rhythm.     Heart sounds: Normal heart sounds. No murmur heard.   Pulmonary:     Effort: Pulmonary effort is normal.     Breath sounds: Normal breath sounds.  Skin:    General: Skin is warm and dry.     Findings: Rash present.       Neurological:     General: No focal deficit present.     Mental Status: She is alert and oriented to person, place, and time. Mental status is at baseline.     Gait: Gait normal.  Psychiatric:        Attention and Perception: Attention and perception normal.        Mood and Affect: Mood and affect normal.        Speech: Speech normal.        Behavior: Behavior normal. Behavior is cooperative.        Thought Content: Thought content normal.        Cognition and Memory: Cognition and memory normal.        Judgment: Judgment normal.     Assessment  Plan  Cellulitis of left upper extremity - Plan: doxycycline (VIBRA-TABS) 100 MG tablet bid x7-10, mupirocin ointment (BACTROBAN) 2 %    Provider: Dr. Olivia Mackie McLean-Scocuzza-Internal Medicine

## 2021-01-05 DIAGNOSIS — H43813 Vitreous degeneration, bilateral: Secondary | ICD-10-CM | POA: Diagnosis not present

## 2021-01-05 DIAGNOSIS — H353190 Nonexudative age-related macular degeneration, unspecified eye, stage unspecified: Secondary | ICD-10-CM | POA: Diagnosis not present

## 2021-02-12 ENCOUNTER — Other Ambulatory Visit: Payer: Self-pay

## 2021-02-12 ENCOUNTER — Encounter: Payer: Self-pay | Admitting: Family

## 2021-02-12 DIAGNOSIS — I10 Essential (primary) hypertension: Secondary | ICD-10-CM

## 2021-02-12 MED ORDER — LOSARTAN POTASSIUM 50 MG PO TABS: 50.0000 mg | ORAL_TABLET | Freq: Every day | ORAL | 3 refills | Status: AC

## 2021-02-14 ENCOUNTER — Encounter: Payer: Self-pay | Admitting: Family

## 2021-02-15 NOTE — Telephone Encounter (Signed)
Pt scheduled for appointment tomorrow.

## 2021-02-16 ENCOUNTER — Ambulatory Visit (INDEPENDENT_AMBULATORY_CARE_PROVIDER_SITE_OTHER): Payer: PPO | Admitting: Family

## 2021-02-16 ENCOUNTER — Encounter: Payer: Self-pay | Admitting: Family

## 2021-02-16 ENCOUNTER — Other Ambulatory Visit: Payer: Self-pay

## 2021-02-16 VITALS — BP 130/66 | HR 65 | Temp 98.3°F | Ht 65.0 in | Wt 225.8 lb

## 2021-02-16 DIAGNOSIS — Z6837 Body mass index (BMI) 37.0-37.9, adult: Secondary | ICD-10-CM

## 2021-02-16 DIAGNOSIS — E669 Obesity, unspecified: Secondary | ICD-10-CM | POA: Insufficient documentation

## 2021-02-16 DIAGNOSIS — R300 Dysuria: Secondary | ICD-10-CM | POA: Diagnosis not present

## 2021-02-16 LAB — POCT URINALYSIS DIPSTICK OB
Bilirubin, UA: NEGATIVE
Blood, UA: NEGATIVE
Glucose, UA: NEGATIVE
Ketones, UA: NEGATIVE
Nitrite, UA: NEGATIVE
POC,PROTEIN,UA: NEGATIVE
Spec Grav, UA: 1.015 (ref 1.010–1.025)
Urobilinogen, UA: 0.2 E.U./dL
pH, UA: 6 (ref 5.0–8.0)

## 2021-02-16 LAB — URINALYSIS, ROUTINE W REFLEX MICROSCOPIC
Bilirubin Urine: NEGATIVE
Hgb urine dipstick: NEGATIVE
Ketones, ur: NEGATIVE
Nitrite: NEGATIVE
RBC / HPF: NONE SEEN (ref 0–?)
Specific Gravity, Urine: 1.01 (ref 1.000–1.030)
Total Protein, Urine: NEGATIVE
Urine Glucose: NEGATIVE
Urobilinogen, UA: 0.2 (ref 0.0–1.0)
pH: 6 (ref 5.0–8.0)

## 2021-02-16 NOTE — Progress Notes (Signed)
Subjective:    Patient ID: [name], female    DOB: December 24, 1940, 80 y.o.   MRN: [id number]  CC: [name] is a 80 y.o. female who presents today for an acute visit.    HPI: CC: dysuria and lower abdominal pain for one week, then resolved for the last 3 days.  No fever, back pain, abdominal pain, constipation,  Hematuria.  She is frustrated by weight.  Eats cereal and milk, and eats small lunch such as yogurt or salad, and dinner is typically a microwave dinner. She doesn't drink soda.  She lives alone.     HISTORY:  Past Medical History:  Diagnosis Date  . Arthritis    bitateral knees and left foot,s/p cortisone injection in past  . Chronic constipation   . Colon cancer ([place]) 2018   pt is undergoing chemo 11-09-17  . Complication of anesthesia     slow to wake up   . Endometrial cancer ([place]) 2014   Total Hysterectomy and Rad tx's.   . Family history of cancer   . Genetic testing 09/26/2017    Multi-Cancer panel (83 genes) @ [facility] - No pathogenic mutations detected  . Hypertension   . IBS (irritable bowel syndrome)   . Macular degeneration of both eyes   . Menopause    age 24  . PONV (postoperative nausea and vomiting)   . Vitamin D deficiency 06/07/10   Past Surgical History:  Procedure Laterality Date  . BREAST BIOPSY Right 12/04/2019   [place] bx-INVASIVE MAMMARY CARCINOMA  . BREAST BIOPSY Left 01/31/2020   fibercystic changes  . BREAST LUMPECTOMY Right 01/03/2020  . BREAST LUMPECTOMY WITH NEEDLE LOCALIZATION AND AXILLARY SENTINEL LYMPH NODE BX Right 01/03/2020   Procedure: BREAST LUMPECTOMY WITH NEEDLE LOCALIZATION AND AXILLARY SENTINEL LYMPH NODE BX;  Surgeon: Fredirick Maudlin, MD;  Location: [facility];  Service: General;  Laterality: Right;  . BUNIONECTOMY    . CATARACT EXTRACTION W/PHACO Left 09/24/2018   Procedure: CATARACT EXTRACTION PHACO AND INTRAOCULAR LENS PLACEMENT ([place]) LEFT;  Surgeon: Eulogio Bear, MD;  Location: [place];  Service:  Ophthalmology;  Laterality: Left;  . CATARACT EXTRACTION W/PHACO Right 10/15/2018   Procedure: CATARACT EXTRACTION PHACO AND INTRAOCULAR LENS PLACEMENT ([place]) RIGHT;  Surgeon: Eulogio Bear, MD;  Location: [place];  Service: Ophthalmology;  Laterality: Right;  . cataract surgery     both eyes  . COLONOSCOPY WITH PROPOFOL N/A 04/04/2017   Procedure: COLONOSCOPY WITH PROPOFOL;  Surgeon: [name], MD;  Location: [facility] END[facility]COPY;  Service: Endoscopy;  Laterality: N/A;  . COLONOSCOPY WITH PROPOFOL N/A 05/22/2018   Procedure: COLONOSCOPY WITH PROPOFOL;  Surgeon: [name], MD;  Location: [facility] ENDOSCOPY;  Service: Endoscopy;  Laterality: N/A;  . DILATION AND CURETTAGE OF UTERUS  1971  . FOOT SURGERY    . MASTECTOMY Right 02/14/2020  . NASAL SINUS SURGERY    . PORT-A-CATH REMOVAL Right 02/14/2020   Procedure: REMOVAL PORT-A-CATH;  Surgeon: Fredirick Maudlin, MD;  Location: [facility];  Service: General;  Laterality: Right;  . PORTACATH PLACEMENT Right 07/12/2017   Procedure: INSERTION PORT-A-CATH;  Surgeon: Jules Husbands, MD;  Location: [facility];  Service: General;  Laterality: Right;  . ROBOTIC ASSISTED LAPAROSCOPIC VENTRAL/INCISIONAL HERNIA REPAIR N/A 06/07/2017   Procedure: ROBOTIC INCISIONAL HERNIA REPAIR;  Surgeon: Leighton Ruff, MD;  [facility];  Service: General;  Laterality: N/A;  . SIMPLE MASTECTOMY WITH AXILLARY SENTINEL NODE BIOPSY Right 02/14/2020   Procedure: Right SIMPLE MASTECTOMY;  Surgeon:  Fredirick Maudlin, MD;  Location: [facility];  Service: General;  Laterality: Right;  Marland Kitchen VAGINAL DELIVERY    . VAGINAL HYSTERECTOMY     Family History  Problem Relation Age of Onset  . Diabetes Father   . Heart attack Father 56       massive MI  . Bipolar disorder Sister   . Osteoporosis Sister   . Breast cancer Sister 90       currently 70  . Cancer Sister        breast  . Bipolar disorder Maternal Aunt   . Breast cancer Maternal Aunt        age at dx unknown; deceased  20s  . Colon cancer Maternal Grandfather 8       deceased 2  . Cancer Maternal Grandfather        breast  . Osteoporosis Mother   . Colon cancer Mother        age at dx unknown; deceased 25  . Cancer Maternal Grandmother        unk. type; deceased 10  . Breast cancer Paternal Aunt        deceased 17    Allergies: Oxycodone and Codeine Current Outpatient Medications on File Prior to Visit  Medication Sig Dispense Refill  . anastrozole (ARIMIDEX) 1 MG tablet Take 1 tablet (1 mg total) by mouth daily. 90 tablet 1  . losartan (COZAAR) 50 MG tablet Take 1 tablet (50 mg total) by mouth daily. 90 tablet 3  . Multiple Vitamin (MULTIVITAMIN WITH MINERALS) TABS tablet Take 1 tablet by mouth daily.    . Multiple Vitamins-Minerals (ICAPS AREDS 2 PO) Take 1 capsule by mouth 2 (two) times daily.     . phenylephrine (SUDAFED PE) 10 MG TABS tablet Take 10 mg by mouth every 4 (four) hours as needed (for allergies/congestion.).     Marland Kitchen primidone (MYSOLINE) 50 MG tablet Take 50 mg by mouth 2 (two) times daily.    . propranolol ER (INDERAL LA) 160 MG SR capsule Take by mouth.    . rivaroxaban (XARELTO) 10 MG TABS tablet Take 1 tablet (10 mg total) by mouth daily. 30 tablet 5  . topiramate (TOPAMAX) 100 MG tablet Take by mouth 2 (two) times daily.     . [DISCONTINUED] prochlorperazine (COMPAZINE) 10 MG tablet Take 1 tablet (10 mg total) by mouth every 6 (six) hours as needed (Nausea or vomiting). (Patient not taking: Reported on 01/28/2019) 30 tablet 1   No current facility-administered medications on file prior to visit.    Social History   Tobacco Use  . Smoking status: Never Smoker  . Smokeless tobacco: Never Used  Vaping Use  . Vaping Use: Never used  Substance Use Topics  . Alcohol use: Yes    Comment: rarely - Holidays  . Drug use: No    Review of Systems  Constitutional: Negative for chills and fever.  Respiratory: Negative for cough.   Cardiovascular: Negative for chest pain and  palpitations.  Gastrointestinal: Negative for abdominal pain, nausea and vomiting.  Genitourinary: Negative for difficulty urinating, frequency and pelvic pain.      Objective:    BP 130/66   Pulse 65   Temp 98.3 F (36.8 C)   Ht 5\' 5"  (1.651 m)   Wt 225 lb 12.8 oz (102.4 kg)   SpO2 99%   BMI 37.58 kg/m  Wt Readings from Last 3 Encounters:  02/16/21 225 lb 12.8 oz (102.4 kg)  12/29/20 222 lb 6.4  oz (100.9 kg)  12/04/20 222 lb (100.7 kg)     Physical Exam Vitals reviewed.  Constitutional:      Appearance: She is well-developed.  Eyes:     Conjunctiva/sclera: Conjunctivae normal.  Cardiovascular:     Rate and Rhythm: Normal rate and regular rhythm.     Pulses: Normal pulses.     Heart sounds: Normal heart sounds.  Pulmonary:     Effort: Pulmonary effort is normal.     Breath sounds: Normal breath sounds. No wheezing, rhonchi or rales.  Skin:    General: Skin is warm and dry.  Neurological:     Mental Status: She is alert.  Psychiatric:        Speech: Speech normal.        Behavior: Behavior normal.        Thought Content: Thought content normal.        Assessment & Plan:   Problem List Items Addressed This Visit      Other   Dysuria - Primary    Symptoms completely resolved. Will culture urine.       Relevant Orders   POC Urinalysis Dipstick OB (Completed)   Urine Culture   Urinalysis, Routine w reflex microscopic   Obesity    Discussed low glycemic diet. Pending tsh. She declines nutrition consult at this time.       Relevant Orders   Hemoglobin A1c   Lipid panel   TSH        I have discontinued Larwance Sachs. Sherrow's doxycycline and mupirocin ointment. I am also having her maintain her multivitamin with minerals, Multiple Vitamins-Minerals (ICAPS AREDS 2 PO), phenylephrine, topiramate, propranolol ER, anastrozole, primidone, rivaroxaban, and losartan.   No orders of the defined types were placed in this encounter.   Return precautions given.    Risks, benefits, and alternatives of the medications and treatment plan prescribed today were discussed, and patient expressed understanding.   Education regarding symptom management and diagnosis given to patient on AVS.  Continue to follow with Burnard Hawthorne, FNP for routine health maintenance.   [name] and I agreed with plan.   [name], FNP

## 2021-02-16 NOTE — Assessment & Plan Note (Addendum)
Discussed low glycemic diet. Pending tsh. She declines nutrition consult at this time.

## 2021-02-16 NOTE — Patient Instructions (Signed)
Let me know if you would like a referral to nutritionist  This is  Dr. Lupita Dawn  example of a  "Low GI"  Diet:  It will allow you to lose 4 to 8  lbs  per month if you follow it carefully.  Your goal with exercise is a minimum of 30 minutes of aerobic exercise 5 days per week (Walking does not count once it becomes easy!)    All of the foods can be found at grocery stores and in bulk at [employer].  The Atkins protein bars and shakes are available in more varieties at Target, WalMart and [employer].     7 AM Breakfast:  Choose from the following:  Low carbohydrate Protein  Shakes (I recommend the  Premier Protein chocolate shakes,  EAS AdvantEdge "Carb Control" shakes  Or the Atkins shakes all are under 3 net carbs)     a scrambled egg/bacon/cheese burrito made with Mission's "carb balance" whole wheat tortilla  (about 10 net carbs )  Regulatory affairs officer (basically a quiche without the pastry crust) that is eaten cold and very convenient way to get your eggs.  8 carbs)  If you make your own protein shakes, avoid bananas and pineapple,  And use low carb greek yogurt or original /unsweetened almond or soy milk    Avoid cereal and bananas, oatmeal and cream of wheat and grits. They are loaded with carbohydrates!   10 AM: high protein snack:  Protein bar by Atkins (the snack size, under 200 cal, usually < 6 net carbs).    A stick of cheese:  Around 1 carb,  100 cal     Dannon Light n Fit [place] Yogurt  (80 cal, 8 carbs)  Other so called "protein bars" and Greek yogurts tend to be loaded with carbohydrates.  Remember, in food advertising, the word "energy" is synonymous for " carbohydrate."  Lunch:   A Sandwich using the bread choices listed, Can use any  Eggs,  lunchmeat, grilled meat or canned tuna), avocado, regular mayo/mustard  and cheese.  A Salad using blue cheese, ranch,  Goddess or vinagrette,  Avoid taco shells, croutons or "confetti" and no "candied nuts" but regular  nuts OK.   No pretzels, nabs  or chips.  Pickles and miniature sweet peppers are a good low carb alternative that provide a "crunch"  The bread is the only source of carbohydrate in a sandwich and  can be decreased by trying some of the attached alternatives to traditional loaf bread   Avoid "Low fat dressings, as well as [place] dressings They are loaded with sugar!   3 PM/ Mid day  Snack:  Consider  1 ounce of  almonds, walnuts, pistachios, pecans, peanuts,  Macadamia nuts or a nut medley.  Avoid "granola and granola bars "  Mixed nuts are ok in moderation as long as there are no raisins,  cranberries or dried fruit.   KIND bars are OK if you get the low glycemic index variety   Try the prosciutto/mozzarella cheese sticks by Fiorruci  In deli /backery section   High protein      6 PM  Dinner:     Meat/fowl/fish with a green salad, and either broccoli, cauliflower, green beans, spinach, brussel sprouts or  Lima beans. DO NOT BREAD THE PROTEIN!!      There is a low carb pasta by Dreamfield's that is acceptable and tastes great: only 5 digestible carbs/serving.( All  grocery stores but BJs carry it ) Several ready made meals are available low carb:   Try Michel Angelo's chicken piccata or chicken or eggplant parm over low carb pasta.(Lowes and BJs)   Marjory Lies Sanchez's "Carnitas" (pulled pork, no sauce,  0 carbs) or his beef pot roast to make a dinner burrito (at [employer]J's)  Pesto over low carb pasta (bj's sells a good quality pesto in the center refrigerated section of the deli   Try satueeing  Cheral Marker with mushroooms as a good side   [employer] Giant makes a mashed cauliflower that tastes like mashed potatoes  Whole wheat pasta is still full of digestible carbs and  Not as low in glycemic index as Dreamfield's.   Brown rice is still rice,  So skip the rice and noodles if you eat [place] or [place] (or at least limit to 1/2 cup)  9 PM snack :   Breyer's "low carb" fudgsicle or  ice  cream bar (Carb Smart line), or  Weight Watcher's ice cream bar , or another "no sugar added" ice cream;  a serving of fresh berries/cherries with whipped cream   Cheese or DANNON'S LlGHT N FIT GREEK YOGURT  8 ounces of [place] unsweetened almond/cococunut milk    Treat yourself to a parfait made with whipped cream blueberiies, walnuts and vanilla greek yogurt  Avoid bananas, pineapple, grapes  and watermelon on a regular basis because they are high in sugar.  THINK OF THEM AS DESSERT  Remember that snack Substitutions should be less than 10 NET carbs per serving and meals < 20 carbs. Remember to subtract fiber grams to get the "net carbs."

## 2021-02-16 NOTE — Assessment & Plan Note (Signed)
Symptoms completely resolved. Will culture urine.

## 2021-02-17 LAB — URINE CULTURE
MICRO NUMBER:: 11732800
SPECIMEN QUALITY:: ADEQUATE

## 2021-02-22 ENCOUNTER — Other Ambulatory Visit: Payer: Self-pay

## 2021-02-22 ENCOUNTER — Other Ambulatory Visit (INDEPENDENT_AMBULATORY_CARE_PROVIDER_SITE_OTHER): Payer: PPO

## 2021-02-22 DIAGNOSIS — Z6837 Body mass index (BMI) 37.0-37.9, adult: Secondary | ICD-10-CM

## 2021-02-22 DIAGNOSIS — E669 Obesity, unspecified: Secondary | ICD-10-CM | POA: Diagnosis not present

## 2021-02-22 LAB — LIPID PANEL
Cholesterol: 209 mg/dL — ABNORMAL HIGH (ref 0–200)
HDL: 69.7 mg/dL (ref >39.00)
LDL Cholesterol: 111 mg/dL — ABNORMAL HIGH (ref 0–99)
NonHDL: 139.68
Total CHOL/HDL Ratio: 3
Triglycerides: 145 mg/dL (ref 0.0–149.0)
VLDL: 29 mg/dL (ref 0.0–40.0)

## 2021-02-22 LAB — TSH: TSH: 1.53 u[IU]/mL (ref 0.35–4.50)

## 2021-02-22 LAB — HEMOGLOBIN A1C: Hgb A1c MFr Bld: 6.1 % (ref 4.6–6.5)

## 2021-02-23 ENCOUNTER — Encounter: Payer: Self-pay | Admitting: Family

## 2021-03-09 ENCOUNTER — Encounter: Payer: Self-pay | Admitting: Oncology

## 2021-04-09 ENCOUNTER — Other Ambulatory Visit: Payer: Self-pay | Admitting: Oncology

## 2021-04-13 ENCOUNTER — Other Ambulatory Visit: Payer: Self-pay

## 2021-04-13 ENCOUNTER — Inpatient Hospital Stay (HOSPITAL_BASED_OUTPATIENT_CLINIC_OR_DEPARTMENT_OTHER): Payer: PPO | Admitting: Oncology

## 2021-04-13 ENCOUNTER — Encounter: Payer: Self-pay | Admitting: Oncology

## 2021-04-13 ENCOUNTER — Inpatient Hospital Stay: Payer: PPO | Attending: Oncology

## 2021-04-13 VITALS — BP 152/62 | HR 51 | Temp 98.1°F | Resp 18 | Wt 223.1 lb

## 2021-04-13 DIAGNOSIS — Z79811 Long term (current) use of aromatase inhibitors: Secondary | ICD-10-CM | POA: Diagnosis not present

## 2021-04-13 DIAGNOSIS — Z9011 Acquired absence of right breast and nipple: Secondary | ICD-10-CM | POA: Insufficient documentation

## 2021-04-13 DIAGNOSIS — Z9221 Personal history of antineoplastic chemotherapy: Secondary | ICD-10-CM | POA: Diagnosis not present

## 2021-04-13 DIAGNOSIS — Z85038 Personal history of other malignant neoplasm of large intestine: Secondary | ICD-10-CM | POA: Diagnosis not present

## 2021-04-13 DIAGNOSIS — Z86718 Personal history of other venous thrombosis and embolism: Secondary | ICD-10-CM

## 2021-04-13 DIAGNOSIS — M8580 Other specified disorders of bone density and structure, unspecified site: Secondary | ICD-10-CM | POA: Insufficient documentation

## 2021-04-13 DIAGNOSIS — Z923 Personal history of irradiation: Secondary | ICD-10-CM | POA: Insufficient documentation

## 2021-04-13 DIAGNOSIS — C186 Malignant neoplasm of descending colon: Secondary | ICD-10-CM | POA: Diagnosis not present

## 2021-04-13 DIAGNOSIS — I82441 Acute embolism and thrombosis of right tibial vein: Secondary | ICD-10-CM

## 2021-04-13 DIAGNOSIS — G250 Essential tremor: Secondary | ICD-10-CM | POA: Diagnosis not present

## 2021-04-13 DIAGNOSIS — Z170 Estrogen receptor positive status [ER+]: Secondary | ICD-10-CM | POA: Diagnosis not present

## 2021-04-13 DIAGNOSIS — Z7901 Long term (current) use of anticoagulants: Secondary | ICD-10-CM | POA: Diagnosis not present

## 2021-04-13 DIAGNOSIS — C50911 Malignant neoplasm of unspecified site of right female breast: Secondary | ICD-10-CM | POA: Diagnosis not present

## 2021-04-13 LAB — CBC WITH DIFFERENTIAL/PLATELET
Abs Immature Granulocytes: 0.01 10*3/uL (ref 0.00–0.07)
Basophils Absolute: 0 10*3/uL (ref 0.0–0.1)
Basophils Relative: 1 %
Eosinophils Absolute: 0.2 10*3/uL (ref 0.0–0.5)
Eosinophils Relative: 5 %
HCT: 38.1 % (ref 36.0–46.0)
Hemoglobin: 12.3 g/dL (ref 12.0–15.0)
Immature Granulocytes: 0 %
Lymphocytes Relative: 26 %
Lymphs Abs: 1.1 10*3/uL (ref 0.7–4.0)
MCH: 29.1 pg (ref 26.0–34.0)
MCHC: 32.3 g/dL (ref 30.0–36.0)
MCV: 90.3 fL (ref 80.0–100.0)
Monocytes Absolute: 0.4 10*3/uL (ref 0.1–1.0)
Monocytes Relative: 9 %
Neutro Abs: 2.4 10*3/uL (ref 1.7–7.7)
Neutrophils Relative %: 59 %
Platelets: 208 10*3/uL (ref 150–400)
RBC: 4.22 MIL/uL (ref 3.87–5.11)
RDW: 14.9 % (ref 11.5–15.5)
WBC: 4.1 10*3/uL (ref 4.0–10.5)
nRBC: 0 % (ref 0.0–0.2)

## 2021-04-13 LAB — COMPREHENSIVE METABOLIC PANEL
ALT: 13 U/L (ref 0–44)
AST: 22 U/L (ref 15–41)
Albumin: 3.8 g/dL (ref 3.5–5.0)
Alkaline Phosphatase: 111 U/L (ref 38–126)
Anion gap: 8 (ref 5–15)
BUN: 16 mg/dL (ref 8–23)
CO2: 24 mmol/L (ref 22–32)
Calcium: 9.4 mg/dL (ref 8.9–10.3)
Chloride: 108 mmol/L (ref 98–111)
Creatinine, Ser: 0.99 mg/dL (ref 0.44–1.00)
GFR, Estimated: 58 mL/min — ABNORMAL LOW (ref >60)
Glucose, Bld: 164 mg/dL — ABNORMAL HIGH (ref 70–99)
Potassium: 4.2 mmol/L (ref 3.5–5.1)
Sodium: 140 mmol/L (ref 135–145)
Total Bilirubin: 0.8 mg/dL (ref 0.3–1.2)
Total Protein: 6.9 g/dL (ref 6.5–8.1)

## 2021-04-13 MED ORDER — RIVAROXABAN 10 MG PO TABS: 10.0000 mg | ORAL_TABLET | Freq: Every day | ORAL | 1 refills | Status: DC

## 2021-04-13 MED ORDER — ANASTROZOLE 1 MG PO TABS: 1.0000 mg | ORAL_TABLET | Freq: Every day | ORAL | 2 refills | Status: AC

## 2021-04-13 NOTE — Progress Notes (Signed)
Pt here for follow up. No new concerns voiced.   

## 2021-04-13 NOTE — Progress Notes (Signed)
Hematology/Oncology Follow Up visit. [place] [phone] [phone]  CONSULT NOTE Patient Care Team: Burnard Hawthorne, FNP as PCP - General (Family Medicine) Vladimir Crofts, MD (Neurology) Noreene Filbert, MD as Referring Physician (Radiation Oncology) Leighton Ruff, MD as Consulting Physician (General Surgery) Earlie Server, MD as Consulting Physician (Oncology) Jules Husbands, MD as Consulting Physician (General Surgery) Rico Junker, RN as Registered Nurse Theodore Demark, RN as Registered Nurse Fredirick Maudlin, MD as Consulting Physician (General Surgery)  PURPOSE OF CONSULTATION:  Follow up for blood clot, and stage IIIB colon cancer, Stage IA right breast cancer  HISTORY OF PRESENTING ILLNESS:  [name] 80 y.o.  female with PMH listed as below was referred to me for evaluation and management of colon cancer.  She had colonoscopy evaluation due to positive cologuard.  She was found to have a nonobstructing mass approximately 20 cm from the anal verge which biopsy proved to be adenocarcinoma. CT scan showed a large mass from the sigmoid and extends beyond the wall of the colon toward the left into the surrounding fat. Preoperational CEA was normal. Patient underwent robotic sigmoidectomy on 06/07/2017.   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 MLH1  loss of function, PMS2 loss of function. Declined option of clinical trial ATOMIC She has a history of endometrial cancer s/p hysterotomy and radiation. She has a family history of colon cancer and breast cancer.  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 Results have been explained to patient by genetic counselor.   # Thyroid nodule, incidental finding from CT surveillance for colon cancer. Discussed  with patient's primary care provider [name].  Patient to follow-up with PCP for further evaluation with ultrasound thyroid.   Oncology treatment S/p adjuvant chemotherapy finished February 2019 Adjuvant RT finished in April 2019.  05/22/2018 Colonoscopy surveillance by Dr.Wohl showed 10 mm polyp in ascending colon.  Removed.  Patent end-to-end colo-colonic anastomosis Pathology showed benign polyp.  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   # mammogram on 11/19/2019.  Right breast mass warrants further evaluation. Right diagnostic mammogram showed 6 x 5 x 7 mm lobular hypoechoic mass, 7:00, 3 cm from nipple.  No right axillary adenopathy ultrasound-guided core needle biopsy was obtained. Pathology showed invasive mammary carcinoma, no special type.  Grade 3, ER/PR strongly positive.  90%, HER-2 negative.  # 01/03/2020 underwent right lumpectomy with SLNB Invasive mammary carcinoma, grade 3, DCIS present, margin is uninvolved by invasive carcinoma, positive for DCIS, multifocal, superior.  pT1c pN0 (sn). #OncotypeDx recurrence score 8, absolute chemotherapy benefit <1% February 2021, patient had Mediport removed.  01/22/20 Bilateral breast MRI was obtain for further evaluation of extent of remaining DCIS.  Image showed seroma with diffuse non mass enhancement in the lower outer right breast extending from mid to posterior depth. Skin thickening. no focally suspicious mass is identified along the margins of the seroma or in the remainder of the right breast.Indeterminate 1cm focus of linear enhancement in the central left breast.  01/21/20 left breast biopsy was negative for malignancy.  # 02/14/2020 patient underwent right breast mastectomy.  No residual malignancy was discovered. Patient reports feeling well today.  Denies any concerns regarding her surgical sites, no erythema, soreness, discharge. 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    #  April 2021 Patient started on Arimidex and she reports tolerating well without any side effects. INTERVAL HISTORY 80 yo female with above history presents for follow up for history of colon cancer, history of breast cancer and history of DVT. She take Xarelto $RemoveBefo'10mg'tLMidBelPHU$  daily.  No bleeding events. On Arimidex daily and tolerates well.  She has no new complaints today.   Review of Systems  Constitutional: Negative for appetite change, chills, fatigue and fever.  HENT:   Negative for hearing loss and voice change.   Eyes: Negative for eye problems.  Respiratory: Negative for chest tightness and cough.   Cardiovascular: Negative for chest pain and leg swelling.  Gastrointestinal: Negative for abdominal distention, abdominal pain, blood in stool and diarrhea.  Endocrine: Negative for hot flashes.  Genitourinary: Negative for difficulty urinating and frequency.   Musculoskeletal: Negative for arthralgias.  Skin: Negative for itching and rash.  Neurological: Negative for extremity weakness.  Hematological: Negative for adenopathy.  Psychiatric/Behavioral: Negative for confusion.   MEDICAL HISTORY:  Past Medical History:  Diagnosis Date  . Arthritis    bitateral knees and left foot,s/p cortisone injection in past  . Chronic constipation   . Colon cancer ([place]) 2018   pt is undergoing chemo 11-09-17  . Complication of anesthesia     slow to wake up   . Endometrial cancer ([place]) 2014   Total Hysterectomy and Rad tx's.   . Family history of cancer   . Genetic testing 09/26/2017    Multi-Cancer panel (83 genes) @ [facility] - No pathogenic mutations detected  . Hypertension   . IBS (irritable bowel syndrome)   . Macular degeneration of both eyes   . Menopause    age 59  . PONV (postoperative nausea and vomiting)   . Vitamin D deficiency 06/07/10    SURGICAL HISTORY: Past Surgical History:  Procedure Laterality Date  . BREAST BIOPSY Right 12/04/2019   [place] bx-INVASIVE MAMMARY CARCINOMA   . BREAST BIOPSY Left 01/31/2020   fibercystic changes  . BREAST LUMPECTOMY Right 01/03/2020  . BREAST LUMPECTOMY WITH NEEDLE LOCALIZATION AND AXILLARY SENTINEL LYMPH NODE BX Right 01/03/2020   Procedure: BREAST LUMPECTOMY WITH NEEDLE LOCALIZATION AND AXILLARY SENTINEL LYMPH NODE BX;  Surgeon: Fredirick Maudlin, MD;  Location: [facility];  Service: General;  Laterality: Right;  . BUNIONECTOMY    . CATARACT EXTRACTION W/PHACO Left 09/24/2018   Procedure: CATARACT EXTRACTION PHACO AND INTRAOCULAR LENS PLACEMENT ([place]) LEFT;  Surgeon: Eulogio Bear, MD;  Location: [place];  Service: Ophthalmology;  Laterality: Left;  . CATARACT EXTRACTION W/PHACO Right 10/15/2018   Procedure: CATARACT EXTRACTION PHACO AND INTRAOCULAR LENS PLACEMENT ([place]) RIGHT;  Surgeon: Eulogio Bear, MD;  Location: [place];  Service: Ophthalmology;  Laterality: Right;  . cataract surgery     both eyes  . COLONOSCOPY WITH PROPOFOL N/A 04/04/2017   Procedure: COLONOSCOPY WITH PROPOFOL;  Surgeon: [name], MD;  Location: [facility];  Service: Endoscopy;  Laterality: N/A;  . COLONOSCOPY WITH PROPOFOL N/A 05/22/2018   Procedure: COLONOSCOPY WITH PROPOFOL;  Surgeon: [name], MD;  Location: [facility] ENDOSCOPY;  Service: Endoscopy;  Laterality: N/A;  . DILATION AND CURETTAGE OF UTERUS  1971  . FOOT SURGERY    . MASTECTOMY Right 02/14/2020  . NASAL SINUS SURGERY    . PORT-A-CATH REMOVAL Right 02/14/2020   Procedure: REMOVAL PORT-A-CATH;  Surgeon: Fredirick Maudlin, MD;  Location: [facility];  Service: General;  Laterality: Right;  . PORTACATH PLACEMENT Right 07/12/2017   Procedure: INSERTION PORT-A-CATH;  Surgeon: Jules Husbands, MD;  Location: [facility];  Service: General;  Laterality: Right;  . ROBOTIC ASSISTED LAPAROSCOPIC VENTRAL/INCISIONAL HERNIA REPAIR N/A 06/07/2017   Procedure: ROBOTIC INCISIONAL HERNIA REPAIR;  Surgeon: Leighton Ruff, MD;  Location: [facility];  Service: General;  Laterality: N/A;  .  SIMPLE MASTECTOMY WITH AXILLARY SENTINEL NODE BIOPSY Right 02/14/2020   Procedure: Right SIMPLE MASTECTOMY;  Surgeon: Fredirick Maudlin, MD;  Location: [facility];  Service: General;  Laterality: Right;  Marland Kitchen VAGINAL DELIVERY    . VAGINAL HYSTERECTOMY      SOCIAL HISTORY: Social History   Socioeconomic History  . Marital status: Divorced    Spouse name: Not on file  . Number of children: Not on file  . Years of education: Not on file  . Highest education level: Not on file  Occupational History  . Not on file  Tobacco Use  . Smoking status: Never Smoker  . Smokeless tobacco: Never Used  Vaping Use  . Vaping Use: Never used  Substance and Sexual Activity  . Alcohol use: Yes    Comment: rarely - Holidays  . Drug use: No  . Sexual activity: Never  Other Topics Concern  . Not on file  Social History Narrative   Exercise- aerobic exercise at home 3-4x week.       Lives in [place]. No pets.   Social Determinants of Health   Financial Resource Strain: Low Risk   . Difficulty of Paying Living Expenses: Not hard at all  Food Insecurity: No Food Insecurity  . Worried About Charity fundraiser in the Last Year: Never true  . Ran Out of Food in the Last Year: Never true  Transportation Needs: No Transportation Needs  . Lack of Transportation (Medical): No  . Lack of Transportation (Non-Medical): No  Physical Activity: Unknown  . Days of Exercise per Week: 0 days  . Minutes of Exercise per Session: Not on file  Stress: No Stress Concern Present  . Feeling of Stress : Not at all  Social Connections: Unknown  . Frequency of Communication with Friends and Family: More than three times a week  . Frequency of Social Gatherings with Friends and Family: More than three times a week  . Attends Religious Services: 1 to 4 times per year  . Active Member of Clubs or Organizations: Yes  . Attends Archivist Meetings: 1 to 4 times per year  . Marital Status: Not on file  Intimate  Partner Violence: Not At Risk  . Fear of Current or Ex-Partner: No  . Emotionally Abused: No  . Physically Abused: No  . Sexually Abused: No    FAMILY HISTORY: Family History  Problem Relation Age of Onset  . Diabetes Father   . Heart attack Father 34       massive MI  . Bipolar disorder Sister   . Osteoporosis Sister   . Breast cancer Sister 63       currently 62  . Cancer Sister        breast  . Bipolar disorder Maternal Aunt   . Breast cancer Maternal Aunt        age at dx unknown; deceased 66s  . Colon cancer Maternal Grandfather 74       deceased 75  . Cancer Maternal Grandfather        breast  . Osteoporosis Mother   . Colon cancer Mother        age at dx unknown; deceased 36  .  Cancer Maternal Grandmother        unk. type; deceased 68  . Breast cancer Paternal Aunt        deceased 48    ALLERGIES:  is allergic to oxycodone and codeine.  MEDICATIONS:  Current Outpatient Medications  Medication Sig Dispense Refill  . Calcium Carb-Cholecalciferol (CALCIUM/VITAMIN D) 600-400 MG-UNIT TABS Take 2 tablets by mouth daily.    Marland Kitchen losartan (COZAAR) 50 MG tablet Take 1 tablet (50 mg total) by mouth daily. 90 tablet 3  . Multiple Vitamin (MULTIVITAMIN WITH MINERALS) TABS tablet Take 1 tablet by mouth daily.    . Multiple Vitamins-Minerals (ICAPS AREDS 2 PO) Take 1 capsule by mouth 2 (two) times daily.     . phenylephrine (SUDAFED PE) 10 MG TABS tablet Take 10 mg by mouth every 4 (four) hours as needed (for allergies/congestion.).     Marland Kitchen primidone (MYSOLINE) 50 MG tablet Take 50 mg by mouth 2 (two) times daily.    . propranolol ER (INDERAL LA) 160 MG SR capsule Take by mouth.    . topiramate (TOPAMAX) 100 MG tablet Take by mouth 2 (two) times daily.     Marland Kitchen anastrozole (ARIMIDEX) 1 MG tablet Take 1 tablet (1 mg total) by mouth daily. 90 tablet 2  . rivaroxaban (XARELTO) 10 MG TABS tablet Take 1 tablet (10 mg total) by mouth daily. 90 tablet 1   No current facility-administered  medications for this visit.      Marland Kitchen  PHYSICAL EXAMINATION: ECOG PERFORMANCE STATUS: 0 - Asymptomatic Vitals:   04/13/21 1009  BP: (!) 152/62  Pulse: (!) 51  Resp: 18  Temp: 98.1 F (36.7 C)   Filed Weights   04/13/21 1009  Weight: 223 lb 1.6 oz (101.2 kg)   Physical Exam Constitutional:      General: She is not in acute distress.    Appearance: She is obese. She is not diaphoretic.  HENT:     Head: Normocephalic and atraumatic.     Nose: Nose normal.     Mouth/Throat:     Pharynx: No oropharyngeal exudate.  Eyes:     General: No scleral icterus.    Pupils: Pupils are equal, round, and reactive to light.  Cardiovascular:     Rate and Rhythm: Normal rate and regular rhythm.     Heart sounds: No murmur heard.   Pulmonary:     Effort: Pulmonary effort is normal. No respiratory distress.     Breath sounds: No rales.  Chest:     Chest wall: No tenderness.  Abdominal:     General: There is no distension.     Palpations: Abdomen is soft.     Tenderness: There is no abdominal tenderness.  Musculoskeletal:        General: Normal range of motion.     Cervical back: Normal range of motion and neck supple.  Skin:    General: Skin is warm and dry.     Findings: No erythema.  Neurological:     Mental Status: She is alert and oriented to person, place, and time.     Cranial Nerves: No cranial nerve deficit.     Motor: No abnormal muscle tone.     Coordination: Coordination normal.  Psychiatric:        Mood and Affect: Affect normal.    Breast exam is performed in seated  position. Patient is status post right mastectomy.  No palpable chest wall recurrence. No palpable bilateral axillary adenopathy.  No palpable  discrete mass in left breast.      LABORATORY DATA:  I have reviewed the data as listed Lab Results  Component Value Date   WBC 4.1 04/13/2021   HGB 12.3 04/13/2021   HCT 38.1 04/13/2021   MCV 90.3 04/13/2021   PLT 208 04/13/2021   Recent Labs     07/10/20 0931 10/12/20 0917 04/13/21 0851  NA 138 140 140  K 4.2 4.2 4.2  CL 107 107 108  CO2 $Re'23 24 24  'czH$ GLUCOSE 108* 164* 164*  BUN $Re'21 13 16  'KlP$ CREATININE 0.95 0.90 0.99  CALCIUM 8.9 9.4 9.4  GFRNONAA 57* >60 58*  GFRAA >60  --   --   PROT 6.9 6.9 6.9  ALBUMIN 3.7 3.7 3.8  AST $Re'22 22 22  'kGG$ ALT $R'16 15 13  'FH$ ALKPHOS 103 94 111  BILITOT 0.5 0.5 0.8    RADIOGRAPHIC STUDIES: I have personally reviewed the radiological images as listed and agreed with the findings in the report. No results found.   ASSESSMENT & PLAN:  80 y.o.female who has Stage IIIB colon cancer.,  History of DVT, presented for evaluation for newly diagnosed breast cancer. Cancer Staging Cancer of descending colon [facility]) Staging form: Colon and Rectum, AJCC 8th Edition - Pathologic: Stage IIIB (pT4a, pN1, cM0) - Signed by Earlie Server, MD on 04/30/2018  Malignant neoplasm of right breast in female, estrogen receptor positive ([place]) Staging form: Breast, AJCC 8th Edition - Clinical: No stage assigned - Unsigned - Pathologic: Stage IA (pT1c, pN0, cM0, G3, ER+, PR+, HER2-, Oncotype DX score: 8) - Signed by Earlie Server, MD on 01/30/2020  1. History of colon cancer   2. History of deep vein thrombosis (DVT) of lower extremity   3. Malignant neoplasm of right breast in female, estrogen receptor positive, unspecified site of breast ([place])   4. Long term current use of aromatase inhibitor   5. Deep vein thrombosis (DVT) of tibial vein of right lower extremity, unspecified chronicity ([place])   6. Osteopenia, unspecified location     #Stage IA, right Breast cancer, pT1cN0 ER/PR positive, HER-2 negative. S/p mastectomy with no residual tumor identified. Labs are reviewed and discussed with patient.  Continue Arimidex. January 2022, screening mammogram of left breast was reviewed and discussed with patient.  Plan annual screening mammogram of the left breast.  # History of colon cancer - diagnosed 06/07/2017, finished all treatment April  2019.  Surveillance CT  September 2021 showed no disease recurrence. It has been 4 years since surgery.  Obtain annual CT surveillance in September 2022.  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  Recommend long-term maintenance anticoagulation.  Continue Xarelto 10 mg daily for prophylaxis.   #Osteopenia,  continue calcium and vitamin D supplementation. She declined bisphosphonate treatment Repeat DEXA every 2 years-April 2023 # Atherosclerosis disease on CT. Discussed with patient. She needs to follow up with PCP for management.   Orders Placed This Encounter  Procedures  . CT CHEST ABDOMEN PELVIS W CONTRAST    Standing Status:   Future    Standing Expiration Date:   04/13/2022    Order Specific Question:   If indicated for the ordered procedure, I authorize the administration of contrast media per Radiology protocol    Answer:   Yes    Order Specific Question:   Preferred imaging location?    Answer:   [facility]    Order Specific Question:   Is Oral Contrast requested for this exam?    Answer:  Yes, Per Radiology protocol    Order Specific Question:   Reason for Exam (SYMPTOM  OR DIAGNOSIS REQUIRED)    Answer:   history of colon cancer- surveillance  . CBC with Differential/Platelet    Standing Status:   Future    Standing Expiration Date:   04/13/2022  . Comprehensive metabolic panel    Standing Status:   Future    Standing Expiration Date:   04/13/2022  . CEA    Standing Status:   Future    Standing Expiration Date:   04/13/2022    Return of visit: 6 months  Earlie Server, MD, PhD Hematology Oncology [facility] Pager- [phone] 04/13/2021

## 2021-04-14 LAB — CEA: CEA: 1.2 ng/mL (ref 0.0–4.7)

## 2021-05-07 ENCOUNTER — Encounter: Payer: Self-pay | Admitting: Oncology

## 2021-05-28 DIAGNOSIS — Z79899 Other long term (current) drug therapy: Secondary | ICD-10-CM | POA: Diagnosis not present

## 2021-06-05 ENCOUNTER — Encounter: Payer: Self-pay | Admitting: Oncology

## 2021-06-07 ENCOUNTER — Ambulatory Visit: Payer: PPO | Admitting: Family

## 2021-06-15 ENCOUNTER — Encounter: Payer: Self-pay | Admitting: Family

## 2021-06-15 ENCOUNTER — Ambulatory Visit (INDEPENDENT_AMBULATORY_CARE_PROVIDER_SITE_OTHER): Payer: PPO | Admitting: Family

## 2021-06-15 ENCOUNTER — Telehealth: Payer: Self-pay

## 2021-06-15 ENCOUNTER — Other Ambulatory Visit: Payer: Self-pay

## 2021-06-15 VITALS — BP 124/56 | HR 57 | Temp 98.3°F | Ht 65.0 in | Wt 223.4 lb

## 2021-06-15 DIAGNOSIS — I700 Atherosclerosis of aorta: Secondary | ICD-10-CM

## 2021-06-15 DIAGNOSIS — I10 Essential (primary) hypertension: Secondary | ICD-10-CM

## 2021-06-15 DIAGNOSIS — I824Y1 Acute embolism and thrombosis of unspecified deep veins of right proximal lower extremity: Secondary | ICD-10-CM

## 2021-06-15 NOTE — Patient Instructions (Signed)
I have placed a referral to our clinical pharmacist in regards to prescription medications,particularly Xarelto cost. I have also placed a referral to cardiology. Let us know if you dont hear back within a week in regards to an appointment being scheduled.

## 2021-06-15 NOTE — Assessment & Plan Note (Signed)
Chronic, stable.  Referral Alphonzo Severance, Pharm.D. in regards to medication assistance, cost for Xarelto.

## 2021-06-15 NOTE — Chronic Care Management (AMB) (Signed)
  Chronic Care Management   Note  06/15/2021 Name: [name] MRN: [id number] DOB: 1941/01/10  [name] is a 80 y.o. year old female who is a primary care patient of Mandy Hawthorne, [name]. I reached out to [name] by phone today in response to a referral sent by [name] PCP, Mandy Hawthorne, [name]      [name] was given information about Chronic Care Management services today including:  CCM service includes personalized support from designated clinical staff supervised by her physician, including individualized plan of care and coordination with other care providers 24/7 contact phone numbers for assistance for urgent and routine care needs. Service will only be billed when office clinical staff spend 20 minutes or more in a month to coordinate care. Only one practitioner may furnish and bill the service in a calendar month. The patient may stop CCM services at any time (effective at the end of the month) by phone call to the office staff. The patient will be responsible for cost sharing (co-pay) of up to 20% of the service fee (after annual deductible is met).  Patient agreed to services and verbal consent obtained.   Follow up plan: Telephone appointment with care management team member scheduled for:07/01/2021  Noreene Larsson, [address], [place], [place] [address] Direct Dial: [phone] [email or web address]: [email or web address]

## 2021-06-15 NOTE — Assessment & Plan Note (Signed)
Chronic.  Again discussed risk of ASCVD and my advice to start low-dose statin.  She and I jointly agreed to consult with cardiology regarding risk stratification and she speak with cardiology in regards to statin.

## 2021-06-15 NOTE — Assessment & Plan Note (Signed)
Chronic, stable.  Continue losartan 50 mg 

## 2021-06-15 NOTE — Progress Notes (Signed)
Subjective:    Patient ID: [name], female    DOB: 09-21-1941, 80 y.o.   MRN: [id number]  CC: [name] is a 80 y.o. female who presents today for follow up.   HPI: Feels well today.  No complaints. She reports cost of Xarelto has increased   HTN- compliant losartan '50mg'$ .No cp.   Atherosclerosis seen abdominal aorta and coronary arteries.  family history of ASCVD Voice and hands tremor- following with neurology, Gayland Curry and titrating the topamax for tremor up to '200mg'$  bid. Neurology also prescribes propranolol, primidone.  Follows with Dr Tasia Catchings h/o colon cancer, right DVT. She is compliant with xarelto '10mg'$ , arimidex daily.  Surveillance ct chest , abdomen due 07/2021   HISTORY:  Past Medical History:  Diagnosis Date   Arthritis    bitateral knees and left foot,s/p cortisone injection in past   Chronic constipation    Colon cancer ([place]) 2018   pt is undergoing chemo [id number]   Complication of anesthesia     slow to wake up    Endometrial cancer ([place]) 2014   Total Hysterectomy and Rad tx's.    Family history of cancer    Genetic testing 09/26/2017    Multi-Cancer panel (83 genes) @ [facility] - No pathogenic mutations detected   Hypertension    IBS (irritable bowel syndrome)    Macular degeneration of both eyes    Menopause    age 56   PONV (postoperative nausea and vomiting)    Vitamin D deficiency 06/07/10   Past Surgical History:  Procedure Laterality Date   BREAST BIOPSY Right 12/04/2019   [place] bx-INVASIVE MAMMARY CARCINOMA   BREAST BIOPSY Left 01/31/2020   fibercystic changes   BREAST LUMPECTOMY Right 01/03/2020   BREAST LUMPECTOMY WITH NEEDLE LOCALIZATION AND AXILLARY SENTINEL LYMPH NODE BX Right 01/03/2020   Procedure: BREAST LUMPECTOMY WITH NEEDLE LOCALIZATION AND AXILLARY SENTINEL LYMPH NODE BX;  Surgeon: Fredirick Maudlin, MD;  Location: [facility];  Service: General;  Laterality: Right;   BUNIONECTOMY     CATARACT EXTRACTION W/PHACO Left 09/24/2018    Procedure: CATARACT EXTRACTION PHACO AND INTRAOCULAR LENS PLACEMENT ([place]) LEFT;  Surgeon: Eulogio Bear, MD;  Location: [facility];  Service: Ophthalmology;  Laterality: Left;   CATARACT EXTRACTION W/PHACO Right 10/15/2018   Procedure: CATARACT EXTRACTION PHACO AND INTRAOCULAR LENS PLACEMENT ([facility]) RIGHT;  Surgeon: Eulogio Bear, MD;  Location: [facility];  Service: Ophthalmology;  Laterality: Right;   cataract surgery     both eyes   COLONOSCOPY WITH PROPOFOL N/A 04/04/2017   Procedure: COLONOSCOPY WITH PROPOFOL;  Surgeon: [name], MD;  Location: [facility];  Service: Endoscopy;  Laterality: N/A;   COLONOSCOPY WITH PROPOFOL N/A 05/22/2018   Procedure: COLONOSCOPY WITH PROPOFOL;  Surgeon: [name], MD;  Location: [facility];  Service: Endoscopy;  Laterality: N/A;   DILATION AND CURETTAGE OF UTERUS  1971   FOOT SURGERY     MASTECTOMY Right 02/14/2020   NASAL SINUS SURGERY     PORT-A-CATH REMOVAL Right 02/14/2020   Procedure: REMOVAL PORT-A-CATH;  Surgeon: Fredirick Maudlin, MD;  Location: [facility];  Service: General;  Laterality: Right;   PORTACATH PLACEMENT Right 07/12/2017   Procedure: INSERTION PORT-A-CATH;  Surgeon: Jules Husbands, MD;  Location: [facility];  Service: General;  Laterality: Right;   ROBOTIC ASSISTED LAPAROSCOPIC VENTRAL/INCISIONAL HERNIA REPAIR N/A 06/07/2017   Procedure: ROBOTIC [place];  Surgeon: Leighton Ruff, MD;  Location: [facility];  Service: General;  Laterality: N/A;  SIMPLE MASTECTOMY WITH AXILLARY SENTINEL NODE BIOPSY Right 02/14/2020   Procedure: Right SIMPLE MASTECTOMY;  Surgeon: Fredirick Maudlin, MD;  Location: [facility];  Service: General;  Laterality: Right;   VAGINAL DELIVERY     VAGINAL HYSTERECTOMY     Family History  Problem Relation Age of Onset   Diabetes Father    Heart attack Father 22       massive MI   Bipolar disorder Sister    Osteoporosis Sister    Breast cancer Sister 42       currently 60    Cancer Sister        breast   Bipolar disorder Maternal Aunt    Breast cancer Maternal Aunt        age at dx unknown; deceased 79s   Colon cancer Maternal Grandfather 73       deceased 41   Cancer Maternal Grandfather        breast   Osteoporosis Mother    Colon cancer Mother        age at dx unknown; deceased 80   Cancer Maternal Grandmother        unk. type; deceased 63   Breast cancer Paternal Aunt        deceased 81    Allergies: Oxycodone and Codeine Current Outpatient Medications on File Prior to Visit  Medication Sig Dispense Refill   anastrozole (ARIMIDEX) 1 MG tablet Take 1 tablet (1 mg total) by mouth daily. 90 tablet 2   Calcium Carb-Cholecalciferol (CALCIUM/VITAMIN D) 600-400 MG-UNIT TABS Take 2 tablets by mouth daily.     losartan (COZAAR) 50 MG tablet Take 1 tablet (50 mg total) by mouth daily. 90 tablet 3   Multiple Vitamin (MULTIVITAMIN WITH MINERALS) TABS tablet Take 1 tablet by mouth daily.     Multiple Vitamins-Minerals (ICAPS AREDS 2 PO) Take 1 capsule by mouth 2 (two) times daily.      phenylephrine (SUDAFED PE) 10 MG TABS tablet Take 10 mg by mouth every 4 (four) hours as needed (for allergies/congestion.).      primidone (MYSOLINE) 50 MG tablet Take 50 mg by mouth 2 (two) times daily.     propranolol ER (INDERAL LA) 160 MG SR capsule Take by mouth.     topiramate (TOPAMAX) 100 MG tablet Take 200 mg by mouth 2 (two) times daily.     rivaroxaban (XARELTO) 10 MG TABS tablet Take 1 tablet (10 mg total) by mouth daily. (Patient not taking: Reported on 06/15/2021) 90 tablet 1   [DISCONTINUED] prochlorperazine (COMPAZINE) 10 MG tablet Take 1 tablet (10 mg total) by mouth every 6 (six) hours as needed (Nausea or vomiting). (Patient not taking: Reported on 01/28/2019) 30 tablet 1   No current facility-administered medications on file prior to visit.    Social History   Tobacco Use   Smoking status: Never   Smokeless tobacco: Never  Vaping Use   Vaping Use: Never  used  Substance Use Topics   Alcohol use: Yes    Comment: rarely - Holidays   Drug use: No    Review of Systems  Constitutional:  Negative for chills and fever.  Respiratory:  Negative for cough.   Cardiovascular:  Negative for chest pain and palpitations.  Gastrointestinal:  Negative for nausea and vomiting.     Objective:    BP (!) 124/56 (BP Location: Left Arm, Patient Position: Sitting, Cuff Size: Large)   Pulse (!) 57   Temp 98.3 F (36.8 C) (Oral)  Ht '5\' 5"'$  (1.651 m)   Wt 223 lb 6.4 oz (101.3 kg)   SpO2 95%   BMI 37.18 kg/m  BP Readings from Last 3 Encounters:  06/15/21 (!) 124/56  04/13/21 (!) 152/62  02/16/21 130/66   Wt Readings from Last 3 Encounters:  06/15/21 223 lb 6.4 oz (101.3 kg)  04/13/21 223 lb 1.6 oz (101.2 kg)  02/16/21 225 lb 12.8 oz (102.4 kg)    Physical Exam Vitals reviewed.  Constitutional:      Appearance: She is well-developed.  Eyes:     Conjunctiva/sclera: Conjunctivae normal.  Cardiovascular:     Rate and Rhythm: Normal rate and regular rhythm.     Pulses: Normal pulses.     Heart sounds: Normal heart sounds.  Pulmonary:     Effort: Pulmonary effort is normal.     Breath sounds: Normal breath sounds. No wheezing, rhonchi or rales.  Skin:    General: Skin is warm and dry.  Neurological:     Mental Status: She is alert.  Psychiatric:        Speech: Speech normal.        Behavior: Behavior normal.        Thought Content: Thought content normal.       Assessment & Plan:   Problem List Items Addressed This Visit       Cardiovascular and Mediastinum   Atherosclerosis of aorta ([place]) - Primary    Chronic.  Again discussed risk of ASCVD and my advice to start low-dose statin.  She and I jointly agreed to consult with cardiology regarding risk stratification and she speak with cardiology in regards to statin.        Relevant Orders   Ambulatory referral to Cardiology   Essential hypertension, benign    Chronic, stable.   Continue losartan 50 mg       Right leg DVT (HCC)    Chronic, stable.  Referral Alphonzo Severance, Pharm.D. in regards to medication assistance, cost for Xarelto.       Relevant Orders   AMB Referral to Community Care Coordinaton     I am having [name] maintain her multivitamin with minerals, Multiple Vitamins-Minerals (ICAPS AREDS 2 PO), phenylephrine, propranolol ER, primidone, losartan, Calcium/Vitamin D, rivaroxaban, anastrozole, and topiramate.   No orders of the defined types were placed in this encounter.   Return precautions given.   Risks, benefits, and alternatives of the medications and treatment plan prescribed today were discussed, and patient expressed understanding.   Education regarding symptom management and diagnosis given to patient on AVS.  Continue to follow with Burnard Hawthorne, FNP for routine health maintenance.   [name] and I agreed with plan.   [name], FNP

## 2021-06-16 ENCOUNTER — Encounter: Payer: Self-pay | Admitting: Family

## 2021-06-16 NOTE — Telephone Encounter (Signed)
I called and triaged patient she is having no CP, arm pain/numbness/tingling or SOB is on going over 1x month. She is having no swelling & only happens when she walks a lot she said. It is only slight she said, but just something she has noticed. I have her scheduled Monday with [name] at 11:30. She as advised if SOB worsened or she began having other symptoms she needed to evaluated by ED or at the very least UC ASAP.

## 2021-06-21 ENCOUNTER — Encounter: Payer: Self-pay | Admitting: Family

## 2021-06-21 ENCOUNTER — Ambulatory Visit (INDEPENDENT_AMBULATORY_CARE_PROVIDER_SITE_OTHER): Payer: PPO | Admitting: Pharmacist

## 2021-06-21 ENCOUNTER — Other Ambulatory Visit: Payer: Self-pay

## 2021-06-21 ENCOUNTER — Ambulatory Visit (INDEPENDENT_AMBULATORY_CARE_PROVIDER_SITE_OTHER): Payer: PPO | Admitting: Family

## 2021-06-21 ENCOUNTER — Ambulatory Visit: Payer: PPO | Admitting: Family

## 2021-06-21 ENCOUNTER — Telehealth: Payer: Self-pay | Admitting: Family

## 2021-06-21 VITALS — BP 118/60 | HR 58 | Temp 98.2°F | Ht 65.0 in | Wt 223.0 lb

## 2021-06-21 DIAGNOSIS — I82441 Acute embolism and thrombosis of right tibial vein: Secondary | ICD-10-CM

## 2021-06-21 DIAGNOSIS — C186 Malignant neoplasm of descending colon: Secondary | ICD-10-CM

## 2021-06-21 DIAGNOSIS — I10 Essential (primary) hypertension: Secondary | ICD-10-CM

## 2021-06-21 DIAGNOSIS — R0602 Shortness of breath: Secondary | ICD-10-CM | POA: Insufficient documentation

## 2021-06-21 DIAGNOSIS — I700 Atherosclerosis of aorta: Secondary | ICD-10-CM | POA: Diagnosis not present

## 2021-06-21 DIAGNOSIS — Z86718 Personal history of other venous thrombosis and embolism: Secondary | ICD-10-CM | POA: Diagnosis not present

## 2021-06-21 DIAGNOSIS — Z8542 Personal history of malignant neoplasm of other parts of uterus: Secondary | ICD-10-CM | POA: Diagnosis not present

## 2021-06-21 DIAGNOSIS — E785 Hyperlipidemia, unspecified: Secondary | ICD-10-CM

## 2021-06-21 DIAGNOSIS — R251 Tremor, unspecified: Secondary | ICD-10-CM

## 2021-06-21 DIAGNOSIS — R001 Bradycardia, unspecified: Secondary | ICD-10-CM | POA: Diagnosis not present

## 2021-06-21 MED ORDER — APIXABAN 2.5 MG PO TABS: 2.5000 mg | ORAL_TABLET | Freq: Two times a day (BID) | ORAL | 1 refills | Status: AC

## 2021-06-21 NOTE — Patient Instructions (Addendum)
We are consulting with our pharmacist and Dr. Tasia Catchings as it relates to Xarelto.  Dr Ubaldo Glassing will see you today at [facility] clinic at 2pm.    If you develop chest pain , numbness or tingling radiating to left arm or jaw, palpitations, dizziness, frequent headaches, changes in vision, or shortness of breath please go directly to emergency room.

## 2021-06-21 NOTE — Assessment & Plan Note (Addendum)
Chronic, unchanged over the last 12 months.  She is not short of breath while she is in the office today and has to walk around the office in the halls, her SaO2 stayed at 99%, heart rate 62.  She is adamant that she is experienced no chest pain or chest heaviness.  She has a strong family history of cardiovascular disease.  EKG today shows significant changes from prior February 12, 2020.  New  T wave inversions lead II, lead III, aVF.  Concerned and I had a secure chat with her cardiologist Dr. Ubaldo Glassing who was very willing and agreeable to see her today at 2 PM in the absence of chest pain.  PE Wells criteria low risk for PE.No active cancer in the last 6 months.  She was off her Xarelto for 1 week.  After consulting with Dr. Tasia Catchings via secure chat, pharmacist Alphonzo Severance was able through the patient assistance program to give patient Eliquis samples today ( Eliquis 2.5 mg twice dail)y which will be more affordable for her. Will follow evaluation with cardiology today, anticipate ischemic work up. If unrevealing, will discuss differentials including pulmonary disease, deconditioning and additional work up. Close follow up.

## 2021-06-21 NOTE — Patient Instructions (Signed)
Visit Information   PATIENT GOALS:   Goals Addressed               This Visit's Progress     Patient Stated     Medication Monitoring (pt-stated)        Patient Goals/Self-Care Activities Over the next 90 days, patient will:  - take medications as prescribed collaborate with provider on medication access solutions        Consent to CCM Services: [name] was given information about Chronic Care Management services including:  CCM service includes personalized support from designated clinical staff supervised by her physician, including individualized plan of care and coordination with other care providers 24/7 contact phone numbers for assistance for urgent and routine care needs. Service will only be billed when office clinical staff spend 20 minutes or more in a month to coordinate care. Only one practitioner may furnish and bill the service in a calendar month. The patient may stop CCM services at any time (effective at the end of the month) by phone call to the office staff. The patient will be responsible for cost sharing (co-pay) of up to 20% of the service fee (after annual deductible is met).  Patient agreed to services and verbal consent obtained.   Patient verbalizes understanding of instructions provided today and agrees to view in [place].   Plan: Telephone follow up appointment with care management team member scheduled for:  ~ 2 weeks  Catie Darnelle Maffucci, PharmD, [place], CPP Clinical Pharmacist [place] at [facility]: Patient Care Plan: Medication Management     Problem Identified: HTN, hx DVT, tremor, hx colon cancer      Long-Range Goal: Disease Progression Prevention   Start Date: 06/21/2021  This Visit's Progress: On track  Priority: High  Note:   Current Barriers:  Unable to independently afford treatment regimen  Pharmacist Clinical Goal(s):  Over the next 90 days, patient will verbalize ability  to afford treatment regimen through collaboration with PharmD and provider.   Interventions: 1:1 collaboration with Burnard Hawthorne, FNP regarding development and update of comprehensive plan of care as evidenced by provider attestation and co-signature Inter-disciplinary care team collaboration (see longitudinal plan of care) Comprehensive medication review performed; medication list updated in electronic medical record  Hx Unprovoked DVT (hx colon cancer, breast cancer): Appropriately treated; current regimen: Xarelto 10 mg daily - indefinite anticoagulation for DVT ppx.  Patient reports that due to the cost, she stopped Xarelto about a week ago.  Reviewed income. Patient qualifies for patient assistance. As we do not have samples of Xarelto in office and as financial requirements are lower for Eliquis, discussed switching to Eliquis 2.5 mg BID w/ Dr. Tasia Catchings and PCP. Dr. Tasia Catchings in agreement. Recommended to stop Xarelto, start Eliquis 2.5 mg BID. Provided with samples today. Patient will return with proof of out of pocket spend. Will collaborate w/ CPhT, PCP for completion and submission of patient assistance application.  Of note, primidone may decrease concentrations of Eliquis (or Xarelto) via 3A4 induction. Eliquis has slightly less CYP3A4 clearance. Will discuss benefit vs risk of continuation of primidone for tremor with patient at our next call.   Hypertension: Controlled; current treatment: losartan 50 mg daily;  Recommended to continue current regimen at this time  Hx breast cancer: Appropriately managed; current regimen: anastrazole 1 mg daily Continue current regimen at this time along with collaboration with oncology.   Tremor: Moderately controlled per patient report; current regimen: topiramate 200  mg daily, propranolol 160 mg daily, primidone 50 mg BID; follows with neurology Continue current regimen at this time. Monitor for CNS sedation with high dose of topiramate. Primidone drug  interaction with DOACs as above.   Supplements: Calcium + vitamin D, multivitamin  Patient Goals/Self-Care Activities Over the next 90 days, patient will:  - take medications as prescribed collaborate with provider on medication access solutions  Follow Up Plan: Telephone follow up appointment with care management team member scheduled for: ~ 2 weeks

## 2021-06-21 NOTE — Progress Notes (Signed)
Subjective:    Patient ID: [name], female    DOB: 19-Mar-1941, 80 y.o.   MRN: [id number]  CC: [name] is a 80 y.o. female who presents today for an acute visit.    HPI: Complains of SOB x 12 months or more, unchanged. She is not SOB while in exam room today or when talking.   SOB with exertion with grocery shopping, long distances such as walking from parking lot of [employer] and into store.   No sob when walking around house.   She is able to carry her laundry, do house chores without SOB.  No leg swelling, CP, left arm numbness, jaw pain, cough, wheezing.   She was unable to fill xarelto due to cost. She stopped medication one week ago.  Sob hasnt changed or increased in this past week.   History of colon cancer and right breast cancer.  No active cancer in the last 6 months No recent surgery or immobilization NO h/o lung disease. Never smoker.  She has seen Dr Ubaldo Glassing; last visit 12/10/2019  Echo 04/27/19  Appt with pharmD Catie 07/01/21  HISTORY:  Past Medical History:  Diagnosis Date   Arthritis    bitateral knees and left foot,s/p cortisone injection in past   Chronic constipation    Colon cancer ([place]) 2018   pt is undergoing chemo [id number]   Complication of anesthesia     slow to wake up    Endometrial cancer ([place]) 2014   Total Hysterectomy and Rad tx's.    Family history of cancer    Genetic testing 09/26/2017    Multi-Cancer panel (83 genes) @ [facility] - No pathogenic mutations detected   Hypertension    IBS (irritable bowel syndrome)    Macular degeneration of both eyes    Menopause    age 67   PONV (postoperative nausea and vomiting)    Vitamin D deficiency 06/07/10   Past Surgical History:  Procedure Laterality Date   BREAST BIOPSY Right 12/04/2019   [place] bx-INVASIVE MAMMARY CARCINOMA   BREAST BIOPSY Left 01/31/2020   fibercystic changes   BREAST LUMPECTOMY Right 01/03/2020   BREAST LUMPECTOMY WITH NEEDLE LOCALIZATION AND AXILLARY SENTINEL LYMPH  NODE BX Right 01/03/2020   Procedure: BREAST LUMPECTOMY WITH NEEDLE LOCALIZATION AND AXILLARY SENTINEL LYMPH NODE BX;  Surgeon: Fredirick Maudlin, MD;  Location: [facility];  Service: General;  Laterality: Right;   BUNIONECTOMY     CATARACT EXTRACTION W/PHACO Left 09/24/2018   Procedure: CATARACT EXTRACTION PHACO AND INTRAOCULAR LENS PLACEMENT ([place]) LEFT;  Surgeon: Eulogio Bear, MD;  Location: [facility];  Service: Ophthalmology;  Laterality: Left;   CATARACT EXTRACTION W/PHACO Right 10/15/2018   Procedure: CATARACT EXTRACTION PHACO AND INTRAOCULAR LENS PLACEMENT (IOC) RIGHT;  Surgeon: Eulogio Bear, MD;  Location: [facility];  Service: Ophthalmology;  Laterality: Right;   cataract surgery     both eyes   COLONOSCOPY WITH PROPOFOL N/A 04/04/2017   Procedure: COLONOSCOPY WITH PROPOFOL;  Surgeon: [name], MD;  Location: [facility] ENDOSCOPY;  Service: Endoscopy;  Laterality: N/A;   COLONOSCOPY WITH PROPOFOL N/A 05/22/2018   Procedure: COLONOSCOPY WITH PROPOFOL;  Surgeon: [name], MD;  Location: [facility] ENDOSCOPY;  Service: Endoscopy;  Laterality: N/A;   DILATION AND CURETTAGE OF UTERUS  1971   FOOT SURGERY     MASTECTOMY Right 02/14/2020   NASAL SINUS SURGERY     PORT-A-CATH REMOVAL Right 02/14/2020   Procedure: REMOVAL PORT-A-CATH;  Surgeon: Fredirick Maudlin, MD;  Location: [facility];  Service: General;  Laterality: Right;   PORTACATH PLACEMENT Right 07/12/2017   Procedure: INSERTION PORT-A-CATH;  Surgeon: Jules Husbands, MD;  Location: [facility];  Service: General;  Laterality: Right;   ROBOTIC ASSISTED LAPAROSCOPIC VENTRAL/INCISIONAL HERNIA REPAIR N/A 06/07/2017   Procedure: ROBOTIC [place];  Surgeon: Leighton Ruff, MD;  Location: [facility];  Service: General;  Laterality: N/A;   SIMPLE MASTECTOMY WITH AXILLARY SENTINEL NODE BIOPSY Right 02/14/2020   Procedure: Right SIMPLE MASTECTOMY;  Surgeon: Fredirick Maudlin, MD;  Location: [facility];  Service: General;   Laterality: Right;   VAGINAL DELIVERY     VAGINAL HYSTERECTOMY     Family History  Problem Relation Age of Onset   Diabetes Father    Heart attack Father 60       massive MI   Bipolar disorder Sister    Osteoporosis Sister    Breast cancer Sister 31       currently 39   Cancer Sister        breast   Bipolar disorder Maternal Aunt    Breast cancer Maternal Aunt        age at dx unknown; deceased 37s   Colon cancer Maternal Grandfather 69       deceased 9   Cancer Maternal Grandfather        breast   Osteoporosis Mother    Colon cancer Mother        age at dx unknown; deceased 8   Cancer Maternal Grandmother        unk. type; deceased 29   Breast cancer Paternal Aunt        deceased 37    Allergies: Oxycodone and Codeine Current Outpatient Medications on File Prior to Visit  Medication Sig Dispense Refill   anastrozole (ARIMIDEX) 1 MG tablet Take 1 tablet (1 mg total) by mouth daily. 90 tablet 2   Calcium Carb-Cholecalciferol (CALCIUM/VITAMIN D) 600-400 MG-UNIT TABS Take 2 tablets by mouth daily.     losartan (COZAAR) 50 MG tablet Take 1 tablet (50 mg total) by mouth daily. 90 tablet 3   Multiple Vitamin (MULTIVITAMIN WITH MINERALS) TABS tablet Take 1 tablet by mouth daily.     Multiple Vitamins-Minerals (ICAPS AREDS 2 PO) Take 1 capsule by mouth 2 (two) times daily.      phenylephrine (SUDAFED PE) 10 MG TABS tablet Take 10 mg by mouth every 4 (four) hours as needed (for allergies/congestion.).      primidone (MYSOLINE) 50 MG tablet Take 50 mg by mouth 2 (two) times daily.     propranolol ER (INDERAL LA) 160 MG SR capsule Take 160 mg by mouth.     topiramate (TOPAMAX) 100 MG tablet Take 200 mg by mouth 2 (two) times daily.     [DISCONTINUED] prochlorperazine (COMPAZINE) 10 MG tablet Take 1 tablet (10 mg total) by mouth every 6 (six) hours as needed (Nausea or vomiting). (Patient not taking: Reported on 01/28/2019) 30 tablet 1   No current facility-administered medications  on file prior to visit.    Social History   Tobacco Use   Smoking status: Never   Smokeless tobacco: Never  Vaping Use   Vaping Use: Never used  Substance Use Topics   Alcohol use: Yes    Comment: rarely - Holidays   Drug use: No    Review of Systems  Constitutional:  Negative for chills and fever.  Respiratory:  Positive for shortness of breath. Negative for cough and chest tightness.  Cardiovascular:  Negative for chest pain, palpitations and leg swelling.  Gastrointestinal:  Negative for nausea and vomiting.     Objective:    BP 118/60 (BP Location: Left Arm, Patient Position: Sitting, Cuff Size: Large)   Pulse (!) 58   Temp 98.2 F (36.8 C) (Oral)   Ht '5\' 5"'$  (1.651 m)   Wt 223 lb (101.2 kg)   SpO2 95%   BMI 37.11 kg/m    Physical Exam Vitals reviewed.  Constitutional:      Appearance: She is well-developed.  Eyes:     Conjunctiva/sclera: Conjunctivae normal.  Cardiovascular:     Rate and Rhythm: Normal rate and regular rhythm.     Pulses: Normal pulses.     Heart sounds: Normal heart sounds.  Pulmonary:     Effort: Pulmonary effort is normal.     Breath sounds: Normal breath sounds. No wheezing, rhonchi or rales.  Skin:    General: Skin is warm and dry.  Neurological:     Mental Status: She is alert.  Psychiatric:        Speech: Speech normal.        Behavior: Behavior normal.        Thought Content: Thought content normal.       Assessment & Plan:   Problem List Items Addressed This Visit       Cardiovascular and Mediastinum   Acute deep vein thrombosis (DVT) of tibial vein of right lower extremity (HCC)   Relevant Medications   apixaban (ELIQUIS) 2.5 MG TABS tablet     Other   SOB (shortness of breath) on exertion    Chronic, unchanged over the last 12 months.  She is not short of breath while she is in the office today and has to walk around the office in the halls, her SaO2 stayed at 99%, heart rate 62.  She is adamant that she is  experienced no chest pain or chest heaviness.  She has a strong family history of cardiovascular disease.  EKG today shows significant changes from prior February 12, 2020.  New  T wave inversions lead II, lead III, aVF.  Concerned and I had a secure chat with her cardiologist Dr. Ubaldo Glassing who was very willing and agreeable to see her today at 2 PM in the absence of chest pain.  PE Wells criteria low risk for PE.No active cancer in the last 6 months.  She was off her Xarelto for 1 week.  After consulting with Dr. Tasia Catchings via secure chat, pharmacist Alphonzo Severance was able through the patient assistance program to give patient Eliquis samples today ( Eliquis 2.5 mg twice dail)y which will be more affordable for her. Will follow evaluation with cardiology today, anticipate ischemic work up. If unrevealing, will discuss differentials including pulmonary disease, deconditioning and additional work up. Close follow up.        Other Visit Diagnoses     Shortness of breath    -  Primary   Relevant Orders   EKG 12-Lead (Completed)         I have discontinued [place] rivaroxaban. I am also having her start on apixaban. Additionally, I am having her maintain her multivitamin with minerals, Multiple Vitamins-Minerals (ICAPS AREDS 2 PO), phenylephrine, propranolol ER, primidone, losartan, Calcium/Vitamin D, anastrozole, and topiramate.   Meds ordered this encounter  Medications   apixaban (ELIQUIS) 2.5 MG TABS tablet    Sig: Take 1 tablet (2.5 mg total) by mouth 2 (two) times daily.  Dispense:  120 tablet    Refill:  1    Order Specific Question:   Supervising Provider    Answer:   Crecencio Mc [[id number]]     Return precautions given.   Risks, benefits, and alternatives of the medications and treatment plan prescribed today were discussed, and patient expressed understanding.   Education regarding symptom management and diagnosis given to patient on AVS.  Continue to follow with Burnard Hawthorne, FNP for routine health maintenance.   [name] and I agreed with plan.   [name], FNP  I have spent 40 minutes with a patient including precharting, exam, reviewing medical records, and discussion plan of care.

## 2021-06-21 NOTE — Chronic Care Management (AMB) (Signed)
Chronic Care Management Pharmacy Note  06/21/2021 Name:  [name] MRN:  [id number] DOB:  Dec 20, 1940    Subjective: [name] is an 80 y.o. year old female who is a primary patient of Burnard Hawthorne, FNP.  The CCM team was consulted for assistance with disease management and care coordination needs.    Engaged with patient face to face for initial visit in response to provider referral for pharmacy case management and/or care coordination services.   Consent to Services:  The patient was given the following information about Chronic Care Management services today, agreed to services, and gave verbal consent: 1. CCM service includes personalized support from designated clinical staff supervised by the primary care provider, including individualized plan of care and coordination with other care providers 2. 24/7 contact phone numbers for assistance for urgent and routine care needs. 3. Service will only be billed when office clinical staff spend 20 minutes or more in a month to coordinate care. 4. Only one practitioner may furnish and bill the service in a calendar month. 5.The patient may stop CCM services at any time (effective at the end of the month) by phone call to the office staff. 6. The patient will be responsible for cost sharing (co-pay) of up to 20% of the service fee (after annual deductible is met). Patient agreed to services and consent obtained.  Patient Care Team: Burnard Hawthorne, FNP as PCP - General (Family Medicine) Vladimir Crofts, MD (Neurology) Noreene Filbert, MD as Referring Physician (Radiation Oncology) Leighton Ruff, MD as Consulting Physician (General Surgery) Earlie Server, MD as Consulting Physician (Oncology) Jules Husbands, MD as Consulting Physician (General Surgery) Rico Junker, RN as Registered Nurse Theodore Demark, RN as Registered Nurse Fredirick Maudlin, MD as Consulting Physician (General Surgery) De Hollingshead, RPH-CPP  (Pharmacist)    Objective:  Lab Results  Component Value Date   CREATININE 0.99 04/13/2021   CREATININE 0.90 10/12/2020   CREATININE 0.95 07/10/2020    Lab Results  Component Value Date   HGBA1C 6.1 02/22/2021   Last diabetic Eye exam: No results found for: HMDIABEYEEXA  Last diabetic Foot exam: No results found for: HMDIABFOOTEX      Component Value Date/Time   CHOL 209 (H) 02/22/2021 1052   TRIG 145.0 02/22/2021 1052   HDL 69.70 02/22/2021 1052   CHOLHDL 3 02/22/2021 1052   VLDL 29.0 02/22/2021 1052   [place] 111 (H) 02/22/2021 1052    Hepatic Function Latest Ref Rng & Units 04/13/2021 10/12/2020 07/10/2020  Total Protein 6.5 - 8.1 g/dL 6.9 6.9 6.9  Albumin 3.5 - 5.0 g/dL 3.8 3.7 3.7  AST 15 - 41 U/L '22 22 22  ' ALT 0 - 44 U/L '13 15 16  ' Alk Phosphatase 38 - 126 U/L 111 94 103  Total Bilirubin 0.3 - 1.2 mg/dL 0.8 0.5 0.5    Lab Results  Component Value Date/Time   TSH 1.53 02/22/2021 10:52 AM   TSH 2.34 08/12/2019 03:03 PM    CBC Latest Ref Rng & Units 04/13/2021 10/12/2020 07/10/2020  WBC 4.0 - 10.5 K/uL 4.1 4.2 5.1  Hemoglobin 12.0 - 15.0 g/dL 12.3 12.0 12.3  Hematocrit 36.0 - 46.0 % 38.1 37.8 36.6  Platelets 150 - 400 K/uL 208 215 202    Lab Results  Component Value Date/Time   VD25OH 14.51 (L) 08/17/2016 08:03 AM    Clinical ASCVD: No  The ASCVD Risk score Mikey Bussing DC Jr., et al., 2013) failed  to calculate for the following reasons:   The 2013 ASCVD risk score is only valid for ages 61 to 3      Social History   Tobacco Use  Smoking Status Never  Smokeless Tobacco Never   BP Readings from Last 3 Encounters:  06/21/21 118/60  06/15/21 (!) 124/56  04/13/21 (!) 152/62   Pulse Readings from Last 3 Encounters:  06/21/21 (!) 58  06/15/21 (!) 57  04/13/21 (!) 51   Wt Readings from Last 3 Encounters:  06/21/21 223 lb (101.2 kg)  06/15/21 223 lb 6.4 oz (101.3 kg)  04/13/21 223 lb 1.6 oz (101.2 kg)    Assessment: Review of patient past medical  history, allergies, medications, health status, including review of consultants reports, laboratory and other test data, was performed as part of comprehensive evaluation and provision of chronic care management services.   SDOH:  (Social Determinants of Health) assessments and interventions performed:  SDOH Interventions    Flowsheet Row Most Recent Value  SDOH Interventions   Financial Strain Interventions Other (Comment)  [manufacturer assistance]       CCM Care Plan  Allergies  Allergen Reactions   Oxycodone Nausea And Vomiting   Codeine Nausea And Vomiting    Medications Reviewed Today     Reviewed by Earlyne Iba, CMA (Certified Medical Assistant) on 06/21/21 at [facility] List Status: <None>   Medication Order Taking? Sig Documenting Provider Last Dose Status Informant  anastrozole (ARIMIDEX) 1 MG tablet [id number] Yes Take 1 tablet (1 mg total) by mouth daily. Earlie Server, MD Taking Active   Calcium Carb-Cholecalciferol (CALCIUM/VITAMIN D) 600-400 MG-UNIT TABS [id number] Yes Take 2 tablets by mouth daily. [provider] Taking Active   losartan (COZAAR) 50 MG tablet [id number] Yes Take 1 tablet (50 mg total) by mouth daily. Burnard Hawthorne, FNP Taking Active   Multiple Vitamin (MULTIVITAMIN WITH MINERALS) TABS tablet [id number] Yes Take 1 tablet by mouth daily. [provider] Taking Active Self  Multiple Vitamins-Minerals (ICAPS AREDS 2 PO) [id number] Yes Take 1 capsule by mouth 2 (two) times daily.  [provider] Taking Active Self  phenylephrine (SUDAFED PE) 10 MG TABS tablet [id number] Yes Take 10 mg by mouth every 4 (four) hours as needed (for allergies/congestion.).  [provider] Taking Active Self  primidone (MYSOLINE) 50 MG tablet [id number] Yes Take 50 mg by mouth 2 (two) times daily. [provider] Taking Active    Patient not taking:   Discontinued 03/02/19 2048   propranolol ER (INDERAL LA) 160 MG SR capsule [id number]  Yes Take 160 mg by mouth. [provider] Taking Active   rivaroxaban (XARELTO) 10 MG TABS tablet [id number] No Take 1 tablet (10 mg total) by mouth daily.  Patient not taking: Reported on 06/21/2021   Earlie Server, MD Not Taking Active   topiramate (TOPAMAX) 100 MG tablet [id number] Yes Take 200 mg by mouth 2 (two) times daily. [provider] Taking Active             Patient Active Problem List   Diagnosis Date Noted   SOB (shortness of breath) on exertion 06/21/2021   Dysuria 02/16/2021   Obesity 02/16/2021   Atherosclerosis of aorta ([place]) 12/04/2020   History of deep vein thrombosis (DVT) of lower extremity 07/10/2020   S/P mastectomy, right 02/14/2020   Essential tremor 02/05/2020   Malignant neoplasm of right breast in female, estrogen receptor positive ([place])    History of thyroid nodule 08/12/2019  Right leg DVT ([place]) 05/24/2019   Acute deep vein thrombosis (DVT) of tibial vein of right lower extremity ([place]) 04/22/2019   Leg swelling 04/17/2019   History of colon cancer    Benign neoplasm of ascending colon    Genetic testing 09/26/2017   Family history of cancer    Cancer of descending colon ([place]) 06/07/2017   Neoplasm of digestive system    Bradycardia 02/14/2017   HLD (hyperlipidemia) 02/13/2017   Osteopenia 08/16/2016   Macular degeneration 08/12/2015   Routine general medical examination at a health care facility 09/26/2014   History of endometrial cancer 12/30/2013   Essential hypertension, benign 04/17/2013   Tremor 07/08/2011    Immunization History  Administered Date(s) Administered   Moderna Sars-Covid-2 Vaccination 04/09/2020, 05/07/2020   Pneumococcal Conjugate-13 04/15/2015   Pneumococcal Polysaccharide-23 07/25/2013   Tdap 08/17/2016   Zoster, Live 07/27/2011    Conditions to be addressed/monitored: HTN and hx DVT, hx colon and breast cancer  Care Plan : Medication Management  Updates made by De Hollingshead, RPH-CPP since  06/21/2021 12:00 AM     Problem: HTN, hx DVT, tremor, hx colon cancer      Long-Range Goal: Disease Progression Prevention   Start Date: 06/21/2021  This Visit's Progress: On track  Priority: High  Note:   Current Barriers:  Unable to independently afford treatment regimen  Pharmacist Clinical Goal(s):  Over the next 90 days, patient will verbalize ability to afford treatment regimen through collaboration with PharmD and provider.   Interventions: 1:1 collaboration with Burnard Hawthorne, FNP regarding development and update of comprehensive plan of care as evidenced by provider attestation and co-signature Inter-disciplinary care team collaboration (see longitudinal plan of care) Comprehensive medication review performed; medication list updated in electronic medical record  Hx Unprovoked DVT (hx colon cancer, breast cancer): Appropriately treated; current regimen: Xarelto 10 mg daily - indefinite anticoagulation for DVT ppx.  Patient reports that due to the cost, she stopped Xarelto about a week ago.  Reviewed income. Patient qualifies for patient assistance. As we do not have samples of Xarelto in office and as financial requirements are lower for Eliquis, discussed switching to Eliquis 2.5 mg BID w/ Dr. Tasia Catchings and PCP. Dr. Tasia Catchings in agreement. Recommended to stop Xarelto, start Eliquis 2.5 mg BID. Provided with samples today. Patient will return with proof of out of pocket spend. Will collaborate w/ CPhT, PCP for completion and submission of patient assistance application.  Of note, primidone may decrease concentrations of Eliquis (or Xarelto) via 3A4 induction. Eliquis has slightly less CYP3A4 clearance. Will discuss benefit vs risk of continuation of primidone for tremor with patient at our next call.   Hypertension: Controlled; current treatment: losartan 50 mg daily;  Recommended to continue current regimen at this time  Hx breast cancer: Appropriately managed; current regimen:  anastrazole 1 mg daily Continue current regimen at this time along with collaboration with oncology.   Tremor: Moderately controlled per patient report; current regimen: topiramate 200 mg daily, propranolol 160 mg daily, primidone 50 mg BID; follows with neurology Continue current regimen at this time. Monitor for CNS sedation with high dose of topiramate. Primidone drug interaction with DOACs as above.   Supplements: Calcium + vitamin D, multivitamin  Patient Goals/Self-Care Activities Over the next 90 days, patient will:  - take medications as prescribed collaborate with provider on medication access solutions  Follow Up Plan: Telephone follow up appointment with care management team member scheduled for: ~ 2 weeks  Medication Assistance:  Will collaborate on patient assistance for Eliquis  Patient's preferred pharmacy is:  [facility] [address] ([address]), [place] ([place]) [place] [address] Phone: [phone] Fax: [phone]  Follow Up:  Patient agrees to Care Plan and Follow-up.  Plan: Telephone follow up appointment with care management team member scheduled for:  ~ 2 weeks  Catie Darnelle Maffucci, PharmD, [place], [place] Clinical Pharmacist [employer] [facility] [employer] [phone]

## 2021-06-22 ENCOUNTER — Encounter: Payer: Self-pay | Admitting: Family

## 2021-06-22 NOTE — Telephone Encounter (Signed)
close

## 2021-06-24 ENCOUNTER — Emergency Department: Payer: PPO

## 2021-06-24 ENCOUNTER — Other Ambulatory Visit: Payer: Self-pay

## 2021-06-24 ENCOUNTER — Emergency Department
Admission: EM | Admit: 2021-06-24 | Discharge: 2021-06-25 | Disposition: A | Discharge status: Home / Self Care | Payer: PPO | Source: Home / Self Care | Attending: Emergency Medicine | Admitting: Emergency Medicine

## 2021-06-24 DIAGNOSIS — S42201A Unspecified fracture of upper end of right humerus, initial encounter for closed fracture: Secondary | ICD-10-CM | POA: Diagnosis not present

## 2021-06-24 DIAGNOSIS — S3991XA Unspecified injury of abdomen, initial encounter: Secondary | ICD-10-CM | POA: Diagnosis not present

## 2021-06-24 DIAGNOSIS — Z79899 Other long term (current) drug therapy: Secondary | ICD-10-CM | POA: Insufficient documentation

## 2021-06-24 DIAGNOSIS — M79603 Pain in arm, unspecified: Secondary | ICD-10-CM | POA: Diagnosis not present

## 2021-06-24 DIAGNOSIS — M542 Cervicalgia: Secondary | ICD-10-CM | POA: Diagnosis not present

## 2021-06-24 DIAGNOSIS — S0990XA Unspecified injury of head, initial encounter: Secondary | ICD-10-CM | POA: Insufficient documentation

## 2021-06-24 DIAGNOSIS — S2231XA Fracture of one rib, right side, initial encounter for closed fracture: Secondary | ICD-10-CM | POA: Diagnosis not present

## 2021-06-24 DIAGNOSIS — G250 Essential tremor: Secondary | ICD-10-CM | POA: Diagnosis present

## 2021-06-24 DIAGNOSIS — Z853 Personal history of malignant neoplasm of breast: Secondary | ICD-10-CM | POA: Insufficient documentation

## 2021-06-24 DIAGNOSIS — I10 Essential (primary) hypertension: Secondary | ICD-10-CM | POA: Diagnosis present

## 2021-06-24 DIAGNOSIS — Z9221 Personal history of antineoplastic chemotherapy: Secondary | ICD-10-CM | POA: Diagnosis not present

## 2021-06-24 DIAGNOSIS — Z66 Do not resuscitate: Secondary | ICD-10-CM | POA: Diagnosis present

## 2021-06-24 DIAGNOSIS — N179 Acute kidney failure, unspecified: Secondary | ICD-10-CM | POA: Diagnosis present

## 2021-06-24 DIAGNOSIS — I214 Non-ST elevation (NSTEMI) myocardial infarction: Secondary | ICD-10-CM | POA: Diagnosis present

## 2021-06-24 DIAGNOSIS — S2241XA Multiple fractures of ribs, right side, initial encounter for closed fracture: Secondary | ICD-10-CM | POA: Insufficient documentation

## 2021-06-24 DIAGNOSIS — S42201D Unspecified fracture of upper end of right humerus, subsequent encounter for fracture with routine healing: Secondary | ICD-10-CM | POA: Diagnosis not present

## 2021-06-24 DIAGNOSIS — Z7901 Long term (current) use of anticoagulants: Secondary | ICD-10-CM | POA: Insufficient documentation

## 2021-06-24 DIAGNOSIS — I700 Atherosclerosis of aorta: Secondary | ICD-10-CM | POA: Diagnosis present

## 2021-06-24 DIAGNOSIS — S42291A Other displaced fracture of upper end of right humerus, initial encounter for closed fracture: Secondary | ICD-10-CM | POA: Diagnosis present

## 2021-06-24 DIAGNOSIS — R001 Bradycardia, unspecified: Secondary | ICD-10-CM | POA: Diagnosis not present

## 2021-06-24 DIAGNOSIS — S0993XA Unspecified injury of face, initial encounter: Secondary | ICD-10-CM | POA: Diagnosis not present

## 2021-06-24 DIAGNOSIS — Z800 Family history of malignant neoplasm of digestive organs: Secondary | ICD-10-CM | POA: Diagnosis not present

## 2021-06-24 DIAGNOSIS — R52 Pain, unspecified: Secondary | ICD-10-CM | POA: Diagnosis not present

## 2021-06-24 DIAGNOSIS — R531 Weakness: Secondary | ICD-10-CM | POA: Diagnosis not present

## 2021-06-24 DIAGNOSIS — R296 Repeated falls: Secondary | ICD-10-CM | POA: Diagnosis present

## 2021-06-24 DIAGNOSIS — M25511 Pain in right shoulder: Secondary | ICD-10-CM | POA: Diagnosis not present

## 2021-06-24 DIAGNOSIS — K589 Irritable bowel syndrome without diarrhea: Secondary | ICD-10-CM | POA: Diagnosis present

## 2021-06-24 DIAGNOSIS — Z20822 Contact with and (suspected) exposure to covid-19: Secondary | ICD-10-CM | POA: Diagnosis present

## 2021-06-24 DIAGNOSIS — Y92410 Unspecified street and highway as the place of occurrence of the external cause: Secondary | ICD-10-CM | POA: Insufficient documentation

## 2021-06-24 DIAGNOSIS — Z85038 Personal history of other malignant neoplasm of large intestine: Secondary | ICD-10-CM | POA: Insufficient documentation

## 2021-06-24 DIAGNOSIS — Z8542 Personal history of malignant neoplasm of other parts of uterus: Secondary | ICD-10-CM | POA: Diagnosis not present

## 2021-06-24 DIAGNOSIS — Z6841 Body Mass Index (BMI) 40.0 and over, adult: Secondary | ICD-10-CM | POA: Diagnosis not present

## 2021-06-24 DIAGNOSIS — K6389 Other specified diseases of intestine: Secondary | ICD-10-CM | POA: Diagnosis not present

## 2021-06-24 DIAGNOSIS — Z86718 Personal history of other venous thrombosis and embolism: Secondary | ICD-10-CM | POA: Diagnosis not present

## 2021-06-24 DIAGNOSIS — K5909 Other constipation: Secondary | ICD-10-CM | POA: Diagnosis present

## 2021-06-24 DIAGNOSIS — C50911 Malignant neoplasm of unspecified site of right female breast: Secondary | ICD-10-CM | POA: Diagnosis present

## 2021-06-24 DIAGNOSIS — J9811 Atelectasis: Secondary | ICD-10-CM | POA: Diagnosis not present

## 2021-06-24 DIAGNOSIS — E785 Hyperlipidemia, unspecified: Secondary | ICD-10-CM | POA: Diagnosis present

## 2021-06-24 DIAGNOSIS — Z923 Personal history of irradiation: Secondary | ICD-10-CM | POA: Diagnosis not present

## 2021-06-24 DIAGNOSIS — S2241XD Multiple fractures of ribs, right side, subsequent encounter for fracture with routine healing: Secondary | ICD-10-CM | POA: Diagnosis not present

## 2021-06-24 DIAGNOSIS — M1990 Unspecified osteoarthritis, unspecified site: Secondary | ICD-10-CM | POA: Diagnosis present

## 2021-06-24 DIAGNOSIS — M79642 Pain in left hand: Secondary | ICD-10-CM | POA: Diagnosis not present

## 2021-06-24 DIAGNOSIS — S3993XA Unspecified injury of pelvis, initial encounter: Secondary | ICD-10-CM | POA: Diagnosis not present

## 2021-06-24 LAB — CBC WITH DIFFERENTIAL/PLATELET
Abs Immature Granulocytes: 0.07 10*3/uL (ref 0.00–0.07)
Basophils Absolute: 0 10*3/uL (ref 0.0–0.1)
Basophils Relative: 0 %
Eosinophils Absolute: 0 10*3/uL (ref 0.0–0.5)
Eosinophils Relative: 0 %
HCT: 38.7 % (ref 36.0–46.0)
Hemoglobin: 12.9 g/dL (ref 12.0–15.0)
Immature Granulocytes: 1 %
Lymphocytes Relative: 10 %
Lymphs Abs: 1.1 10*3/uL (ref 0.7–4.0)
MCH: 30.5 pg (ref 26.0–34.0)
MCHC: 33.3 g/dL (ref 30.0–36.0)
MCV: 91.5 fL (ref 80.0–100.0)
Monocytes Absolute: 0.5 10*3/uL (ref 0.1–1.0)
Monocytes Relative: 5 %
Neutro Abs: 8.8 10*3/uL — ABNORMAL HIGH (ref 1.7–7.7)
Neutrophils Relative %: 84 %
Platelets: 212 10*3/uL (ref 150–400)
RBC: 4.23 MIL/uL (ref 3.87–5.11)
RDW: 14.6 % (ref 11.5–15.5)
WBC: 10.5 10*3/uL (ref 4.0–10.5)
nRBC: 0 % (ref 0.0–0.2)

## 2021-06-24 LAB — COMPREHENSIVE METABOLIC PANEL
ALT: 25 U/L (ref 0–44)
AST: 37 U/L (ref 15–41)
Albumin: 4.1 g/dL (ref 3.5–5.0)
Alkaline Phosphatase: 129 U/L — ABNORMAL HIGH (ref 38–126)
Anion gap: 6 (ref 5–15)
BUN: 30 mg/dL — ABNORMAL HIGH (ref 8–23)
CO2: 23 mmol/L (ref 22–32)
Calcium: 9.2 mg/dL (ref 8.9–10.3)
Chloride: 107 mmol/L (ref 98–111)
Creatinine, Ser: 0.91 mg/dL (ref 0.44–1.00)
GFR, Estimated: >60 mL/min (ref >60)
Glucose, Bld: 167 mg/dL — ABNORMAL HIGH (ref 70–99)
Potassium: 4.1 mmol/L (ref 3.5–5.1)
Sodium: 136 mmol/L (ref 135–145)
Total Bilirubin: 0.9 mg/dL (ref 0.3–1.2)
Total Protein: 7.4 g/dL (ref 6.5–8.1)

## 2021-06-24 LAB — TYPE AND SCREEN
ABO/RH(D): A POS
Antibody Screen: NEGATIVE

## 2021-06-24 LAB — PROTIME-INR
INR: 1 (ref 0.8–1.2)
Prothrombin Time: 13.5 seconds (ref 11.4–15.2)

## 2021-06-24 MED ORDER — FENTANYL CITRATE (PF) 100 MCG/2ML IJ SOLN
50.0000 ug | Freq: Once | INTRAMUSCULAR | Status: AC
Start: 2021-06-24 — End: 2021-06-24
  Administered 2021-06-24: 50 ug via INTRAVENOUS
  Filled 2021-06-24: qty 2

## 2021-06-24 MED ORDER — IOHEXOL 350 MG/ML SOLN
100.0000 mL | Freq: Once | INTRAVENOUS | Status: AC | PRN
  Administered 2021-06-24: 100 mL via INTRAVENOUS

## 2021-06-24 MED ORDER — HYDROCODONE-ACETAMINOPHEN 5-325 MG PO TABS: 1.0000 | ORAL_TABLET | Freq: Four times a day (QID) | ORAL | 0 refills | Status: AC | PRN

## 2021-06-24 MED ORDER — HYDROCODONE-ACETAMINOPHEN 5-325 MG PO TABS
1.0000 | ORAL_TABLET | Freq: Once | ORAL | Status: AC
  Administered 2021-06-24: 1 via ORAL
  Filled 2021-06-24: qty 1

## 2021-06-24 NOTE — ED Notes (Signed)
Pt back from CT scan
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
## 2021-06-24 NOTE — ED Notes (Signed)
Pt taken to CT scan.
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
## 2021-06-24 NOTE — Discharge Instructions (Addendum)
As we discussed if you find yourself taking the narcotic pain medication frequently please start taking a stool softener or laxative. Please seek medical attention for any high fevers, chest pain, shortness of breath, change in behavior, persistent vomiting, bloody stool or any other new or concerning symptoms.

## 2021-06-25 DIAGNOSIS — Z79899 Other long term (current) drug therapy: Secondary | ICD-10-CM

## 2021-06-25 DIAGNOSIS — N179 Acute kidney failure, unspecified: Secondary | ICD-10-CM | POA: Diagnosis present

## 2021-06-25 DIAGNOSIS — S42201D Unspecified fracture of upper end of right humerus, subsequent encounter for fracture with routine healing: Secondary | ICD-10-CM

## 2021-06-25 DIAGNOSIS — C50911 Malignant neoplasm of unspecified site of right female breast: Secondary | ICD-10-CM | POA: Diagnosis present

## 2021-06-25 DIAGNOSIS — R531 Weakness: Secondary | ICD-10-CM

## 2021-06-25 DIAGNOSIS — Z170 Estrogen receptor positive status [ER+]: Secondary | ICD-10-CM

## 2021-06-25 DIAGNOSIS — Z86718 Personal history of other venous thrombosis and embolism: Secondary | ICD-10-CM

## 2021-06-25 DIAGNOSIS — E785 Hyperlipidemia, unspecified: Secondary | ICD-10-CM | POA: Diagnosis present

## 2021-06-25 DIAGNOSIS — S2241XD Multiple fractures of ribs, right side, subsequent encounter for fracture with routine healing: Secondary | ICD-10-CM

## 2021-06-25 DIAGNOSIS — Z9011 Acquired absence of right breast and nipple: Secondary | ICD-10-CM

## 2021-06-25 DIAGNOSIS — K5909 Other constipation: Secondary | ICD-10-CM | POA: Diagnosis present

## 2021-06-25 DIAGNOSIS — S2241XA Multiple fractures of ribs, right side, initial encounter for closed fracture: Secondary | ICD-10-CM | POA: Diagnosis present

## 2021-06-25 DIAGNOSIS — Z9221 Personal history of antineoplastic chemotherapy: Secondary | ICD-10-CM

## 2021-06-25 DIAGNOSIS — I214 Non-ST elevation (NSTEMI) myocardial infarction: Secondary | ICD-10-CM | POA: Diagnosis not present

## 2021-06-25 DIAGNOSIS — M1990 Unspecified osteoarthritis, unspecified site: Secondary | ICD-10-CM | POA: Diagnosis present

## 2021-06-25 DIAGNOSIS — I10 Essential (primary) hypertension: Secondary | ICD-10-CM | POA: Diagnosis present

## 2021-06-25 DIAGNOSIS — S42291A Other displaced fracture of upper end of right humerus, initial encounter for closed fracture: Secondary | ICD-10-CM | POA: Diagnosis present

## 2021-06-25 DIAGNOSIS — Z803 Family history of malignant neoplasm of breast: Secondary | ICD-10-CM

## 2021-06-25 DIAGNOSIS — Z66 Do not resuscitate: Secondary | ICD-10-CM | POA: Diagnosis present

## 2021-06-25 DIAGNOSIS — Z85038 Personal history of other malignant neoplasm of large intestine: Secondary | ICD-10-CM

## 2021-06-25 DIAGNOSIS — Z8542 Personal history of malignant neoplasm of other parts of uterus: Secondary | ICD-10-CM

## 2021-06-25 DIAGNOSIS — Z885 Allergy status to narcotic agent status: Secondary | ICD-10-CM

## 2021-06-25 DIAGNOSIS — K589 Irritable bowel syndrome without diarrhea: Secondary | ICD-10-CM | POA: Diagnosis present

## 2021-06-25 DIAGNOSIS — Z90710 Acquired absence of both cervix and uterus: Secondary | ICD-10-CM

## 2021-06-25 DIAGNOSIS — Z853 Personal history of malignant neoplasm of breast: Secondary | ICD-10-CM

## 2021-06-25 DIAGNOSIS — Z79811 Long term (current) use of aromatase inhibitors: Secondary | ICD-10-CM

## 2021-06-25 DIAGNOSIS — R296 Repeated falls: Secondary | ICD-10-CM | POA: Diagnosis present

## 2021-06-25 DIAGNOSIS — Z923 Personal history of irradiation: Secondary | ICD-10-CM

## 2021-06-25 DIAGNOSIS — Z6841 Body Mass Index (BMI) 40.0 and over, adult: Secondary | ICD-10-CM

## 2021-06-25 DIAGNOSIS — Z7901 Long term (current) use of anticoagulants: Secondary | ICD-10-CM

## 2021-06-25 DIAGNOSIS — Z20822 Contact with and (suspected) exposure to covid-19: Secondary | ICD-10-CM | POA: Diagnosis present

## 2021-06-25 DIAGNOSIS — G250 Essential tremor: Secondary | ICD-10-CM | POA: Diagnosis present

## 2021-06-25 DIAGNOSIS — Z800 Family history of malignant neoplasm of digestive organs: Secondary | ICD-10-CM

## 2021-06-25 DIAGNOSIS — I700 Atherosclerosis of aorta: Secondary | ICD-10-CM | POA: Diagnosis present
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
## 2021-06-26 ENCOUNTER — Other Ambulatory Visit: Payer: Self-pay

## 2021-06-26 ENCOUNTER — Encounter: Payer: Self-pay | Admitting: Family Medicine

## 2021-06-26 ENCOUNTER — Emergency Department: Payer: PPO

## 2021-06-26 DIAGNOSIS — S2231XA Fracture of one rib, right side, initial encounter for closed fracture: Secondary | ICD-10-CM | POA: Diagnosis not present

## 2021-06-26 DIAGNOSIS — S2241XD Multiple fractures of ribs, right side, subsequent encounter for fracture with routine healing: Secondary | ICD-10-CM | POA: Diagnosis not present

## 2021-06-26 DIAGNOSIS — Z8542 Personal history of malignant neoplasm of other parts of uterus: Secondary | ICD-10-CM | POA: Diagnosis not present

## 2021-06-26 DIAGNOSIS — Z86718 Personal history of other venous thrombosis and embolism: Secondary | ICD-10-CM | POA: Diagnosis not present

## 2021-06-26 DIAGNOSIS — K5909 Other constipation: Secondary | ICD-10-CM | POA: Diagnosis present

## 2021-06-26 DIAGNOSIS — Z923 Personal history of irradiation: Secondary | ICD-10-CM | POA: Diagnosis not present

## 2021-06-26 DIAGNOSIS — Z66 Do not resuscitate: Secondary | ICD-10-CM | POA: Diagnosis present

## 2021-06-26 DIAGNOSIS — S42201D Unspecified fracture of upper end of right humerus, subsequent encounter for fracture with routine healing: Secondary | ICD-10-CM | POA: Diagnosis not present

## 2021-06-26 DIAGNOSIS — E785 Hyperlipidemia, unspecified: Secondary | ICD-10-CM | POA: Diagnosis present

## 2021-06-26 DIAGNOSIS — R296 Repeated falls: Secondary | ICD-10-CM | POA: Diagnosis present

## 2021-06-26 DIAGNOSIS — Z20822 Contact with and (suspected) exposure to covid-19: Secondary | ICD-10-CM | POA: Diagnosis present

## 2021-06-26 DIAGNOSIS — M1990 Unspecified osteoarthritis, unspecified site: Secondary | ICD-10-CM | POA: Diagnosis present

## 2021-06-26 DIAGNOSIS — I214 Non-ST elevation (NSTEMI) myocardial infarction: Secondary | ICD-10-CM | POA: Diagnosis present

## 2021-06-26 DIAGNOSIS — I700 Atherosclerosis of aorta: Secondary | ICD-10-CM | POA: Diagnosis present

## 2021-06-26 DIAGNOSIS — M79642 Pain in left hand: Secondary | ICD-10-CM | POA: Diagnosis not present

## 2021-06-26 DIAGNOSIS — K589 Irritable bowel syndrome without diarrhea: Secondary | ICD-10-CM | POA: Diagnosis present

## 2021-06-26 DIAGNOSIS — C50911 Malignant neoplasm of unspecified site of right female breast: Secondary | ICD-10-CM | POA: Diagnosis present

## 2021-06-26 DIAGNOSIS — I10 Essential (primary) hypertension: Secondary | ICD-10-CM | POA: Diagnosis present

## 2021-06-26 DIAGNOSIS — Z7901 Long term (current) use of anticoagulants: Secondary | ICD-10-CM | POA: Diagnosis not present

## 2021-06-26 DIAGNOSIS — N179 Acute kidney failure, unspecified: Secondary | ICD-10-CM | POA: Diagnosis present

## 2021-06-26 DIAGNOSIS — Z85038 Personal history of other malignant neoplasm of large intestine: Secondary | ICD-10-CM | POA: Diagnosis not present

## 2021-06-26 DIAGNOSIS — Z9221 Personal history of antineoplastic chemotherapy: Secondary | ICD-10-CM | POA: Diagnosis not present

## 2021-06-26 DIAGNOSIS — S2241XA Multiple fractures of ribs, right side, initial encounter for closed fracture: Secondary | ICD-10-CM | POA: Diagnosis present

## 2021-06-26 DIAGNOSIS — G250 Essential tremor: Secondary | ICD-10-CM | POA: Diagnosis present

## 2021-06-26 DIAGNOSIS — S42291A Other displaced fracture of upper end of right humerus, initial encounter for closed fracture: Secondary | ICD-10-CM | POA: Diagnosis present

## 2021-06-26 DIAGNOSIS — R531 Weakness: Secondary | ICD-10-CM

## 2021-06-26 DIAGNOSIS — Z800 Family history of malignant neoplasm of digestive organs: Secondary | ICD-10-CM | POA: Diagnosis not present

## 2021-06-26 DIAGNOSIS — Z6841 Body Mass Index (BMI) 40.0 and over, adult: Secondary | ICD-10-CM | POA: Diagnosis not present

## 2021-06-26 LAB — BASIC METABOLIC PANEL
Anion gap: 9 (ref 5–15)
BUN: 30 mg/dL — ABNORMAL HIGH (ref 8–23)
CO2: 20 mmol/L — ABNORMAL LOW (ref 22–32)
Calcium: 8.5 mg/dL — ABNORMAL LOW (ref 8.9–10.3)
Chloride: 110 mmol/L (ref 98–111)
Creatinine, Ser: 1.09 mg/dL — ABNORMAL HIGH (ref 0.44–1.00)
GFR, Estimated: 51 mL/min — ABNORMAL LOW (ref >60)
Glucose, Bld: 123 mg/dL — ABNORMAL HIGH (ref 70–99)
Potassium: 4 mmol/L (ref 3.5–5.1)
Sodium: 139 mmol/L (ref 135–145)

## 2021-06-26 LAB — COMPREHENSIVE METABOLIC PANEL
ALT: 19 U/L (ref 0–44)
AST: 29 U/L (ref 15–41)
Albumin: 3.7 g/dL (ref 3.5–5.0)
Alkaline Phosphatase: 101 U/L (ref 38–126)
Anion gap: 5 (ref 5–15)
BUN: 34 mg/dL — ABNORMAL HIGH (ref 8–23)
CO2: 23 mmol/L (ref 22–32)
Calcium: 8.5 mg/dL — ABNORMAL LOW (ref 8.9–10.3)
Chloride: 109 mmol/L (ref 98–111)
Creatinine, Ser: 1.27 mg/dL — ABNORMAL HIGH (ref 0.44–1.00)
GFR, Estimated: 43 mL/min — ABNORMAL LOW (ref >60)
Glucose, Bld: 123 mg/dL — ABNORMAL HIGH (ref 70–99)
Potassium: 3.3 mmol/L — ABNORMAL LOW (ref 3.5–5.1)
Sodium: 137 mmol/L (ref 135–145)
Total Bilirubin: 0.6 mg/dL (ref 0.3–1.2)
Total Protein: 6.9 g/dL (ref 6.5–8.1)

## 2021-06-26 LAB — CBC WITH DIFFERENTIAL/PLATELET
Abs Immature Granulocytes: 0.03 10*3/uL (ref 0.00–0.07)
Basophils Absolute: 0 10*3/uL (ref 0.0–0.1)
Basophils Relative: 1 %
Eosinophils Absolute: 0.1 10*3/uL (ref 0.0–0.5)
Eosinophils Relative: 1 %
HCT: 32.5 % — ABNORMAL LOW (ref 36.0–46.0)
Hemoglobin: 10.9 g/dL — ABNORMAL LOW (ref 12.0–15.0)
Immature Granulocytes: 0 %
Lymphocytes Relative: 19 %
Lymphs Abs: 1.6 10*3/uL (ref 0.7–4.0)
MCH: 30.1 pg (ref 26.0–34.0)
MCHC: 33.5 g/dL (ref 30.0–36.0)
MCV: 89.8 fL (ref 80.0–100.0)
Monocytes Absolute: 0.9 10*3/uL (ref 0.1–1.0)
Monocytes Relative: 11 %
Neutro Abs: 5.6 10*3/uL (ref 1.7–7.7)
Neutrophils Relative %: 68 %
Platelets: 192 10*3/uL (ref 150–400)
RBC: 3.62 MIL/uL — ABNORMAL LOW (ref 3.87–5.11)
RDW: 14.7 % (ref 11.5–15.5)
WBC: 8.2 10*3/uL (ref 4.0–10.5)
nRBC: 0 % (ref 0.0–0.2)

## 2021-06-26 LAB — TROPONIN I (HIGH SENSITIVITY)
Troponin I (High Sensitivity): 107 ng/L (ref <18)
Troponin I (High Sensitivity): 113 ng/L (ref <18)
Troponin I (High Sensitivity): 105 ng/L (ref <18)

## 2021-06-26 LAB — CBC
HCT: 33 % — ABNORMAL LOW (ref 36.0–46.0)
Hemoglobin: 10.6 g/dL — ABNORMAL LOW (ref 12.0–15.0)
MCH: 30.2 pg (ref 26.0–34.0)
MCHC: 32.1 g/dL (ref 30.0–36.0)
MCV: 94 fL (ref 80.0–100.0)
Platelets: 171 10*3/uL (ref 150–400)
RBC: 3.51 MIL/uL — ABNORMAL LOW (ref 3.87–5.11)
RDW: 14.7 % (ref 11.5–15.5)
WBC: 7.3 10*3/uL (ref 4.0–10.5)
nRBC: 0 % (ref 0.0–0.2)

## 2021-06-26 LAB — CK
Total CK: 164 U/L (ref 38–234)
Total CK: 143 U/L (ref 38–234)

## 2021-06-26 LAB — HEPARIN LEVEL (UNFRACTIONATED): Heparin Unfractionated: 1.03 IU/mL — ABNORMAL HIGH (ref 0.30–0.70)

## 2021-06-26 LAB — LIPID PANEL
Cholesterol: 171 mg/dL (ref 0–200)
HDL: 63 mg/dL (ref >40)
LDL Cholesterol: 92 mg/dL (ref 0–99)
Total CHOL/HDL Ratio: 2.7 RATIO
Triglycerides: 80 mg/dL (ref <150)
VLDL: 16 mg/dL (ref 0–40)

## 2021-06-26 LAB — CKMB (ARMC ONLY)
CK, MB: 4.9 ng/mL (ref 0.5–5.0)
CK, MB: 4.8 ng/mL (ref 0.5–5.0)
CK, MB: 5 ng/mL (ref 0.5–5.0)
CK, MB: 5.1 ng/mL — ABNORMAL HIGH (ref 0.5–5.0)

## 2021-06-26 LAB — APTT
aPTT: 152 seconds — ABNORMAL HIGH (ref 24–36)
aPTT: 72 seconds — ABNORMAL HIGH (ref 24–36)

## 2021-06-26 LAB — RESP PANEL BY RT-PCR (FLU A&B, COVID) ARPGX2
Influenza A by PCR: NEGATIVE
Influenza B by PCR: NEGATIVE
SARS Coronavirus 2 by RT PCR: NEGATIVE

## 2021-06-26 MED ORDER — PRIMIDONE 50 MG PO TABS
50.0000 mg | ORAL_TABLET | Freq: Two times a day (BID) | ORAL | Status: DC
  Administered 2021-06-26 – 2021-06-28 (×5): 50 mg via ORAL
  Filled 2021-06-26 (×9): qty 1

## 2021-06-26 MED ORDER — ALPRAZOLAM 0.25 MG PO TABS: 0.2500 mg | ORAL_TABLET | Freq: Two times a day (BID) | ORAL | Status: DC | PRN

## 2021-06-26 MED ORDER — PROPRANOLOL HCL ER 80 MG PO CP24
160.0000 mg | ORAL_CAPSULE | Freq: Every day | ORAL | Status: DC
  Administered 2021-06-27: 160 mg via ORAL
  Filled 2021-06-26 (×3): qty 2

## 2021-06-26 MED ORDER — FENTANYL CITRATE (PF) 100 MCG/2ML IJ SOLN
50.0000 ug | Freq: Once | INTRAMUSCULAR | Status: AC
  Administered 2021-06-26: 50 ug via INTRAVENOUS
  Filled 2021-06-26: qty 2

## 2021-06-26 MED ORDER — HYDROMORPHONE HCL 1 MG/ML IJ SOLN
0.5000 mg | Freq: Once | INTRAMUSCULAR | Status: AC
  Administered 2021-06-26: 0.5 mg via INTRAVENOUS
  Filled 2021-06-26: qty 1

## 2021-06-26 MED ORDER — ANASTROZOLE 1 MG PO TABS
1.0000 mg | ORAL_TABLET | Freq: Every day | ORAL | Status: DC
  Administered 2021-06-26 – 2021-06-28 (×3): 1 mg via ORAL
  Filled 2021-06-26 (×4): qty 1

## 2021-06-26 MED ORDER — HEPARIN (PORCINE) 25000 UT/250ML-% IV SOLN
1050.0000 [IU]/h | INTRAVENOUS | Status: DC
  Administered 2021-06-26: 1050 [IU]/h via INTRAVENOUS
  Filled 2021-06-26: qty 250

## 2021-06-26 MED ORDER — ASPIRIN 81 MG PO CHEW
324.0000 mg | CHEWABLE_TABLET | Freq: Once | ORAL | Status: AC
  Administered 2021-06-26: 324 mg via ORAL
  Filled 2021-06-26: qty 4

## 2021-06-26 MED ORDER — HEPARIN BOLUS VIA INFUSION
4000.0000 [IU] | Freq: Once | INTRAVENOUS | Status: AC
  Administered 2021-06-26: 4000 [IU] via INTRAVENOUS
  Filled 2021-06-26: qty 4000

## 2021-06-26 MED ORDER — ONDANSETRON HCL 4 MG/2ML IJ SOLN: 4.0000 mg | INTRAMUSCULAR | Status: DC | PRN

## 2021-06-26 MED ORDER — ASPIRIN 300 MG RE SUPP: 300.0000 mg | RECTAL | Status: DC

## 2021-06-26 MED ORDER — ACETAMINOPHEN 325 MG PO TABS
650.0000 mg | ORAL_TABLET | ORAL | Status: DC | PRN
Start: 2021-06-26 — End: 2021-06-29
  Administered 2021-06-26: 650 mg via ORAL
  Filled 2021-06-26: qty 2

## 2021-06-26 MED ORDER — NITROGLYCERIN 0.4 MG SL SUBL: 0.4000 mg | SUBLINGUAL_TABLET | SUBLINGUAL | Status: DC | PRN

## 2021-06-26 MED ORDER — SODIUM CHLORIDE 0.9 % IV SOLN: INTRAVENOUS | Status: AC

## 2021-06-26 MED ORDER — CALCIUM CARBONATE-VITAMIN D 500-200 MG-UNIT PO TABS
2.0000 | ORAL_TABLET | Freq: Every day | ORAL | Status: DC
  Administered 2021-06-26 – 2021-06-28 (×3): 2 via ORAL
  Filled 2021-06-26 (×4): qty 2

## 2021-06-26 MED ORDER — TOPIRAMATE 100 MG PO TABS
200.0000 mg | ORAL_TABLET | Freq: Two times a day (BID) | ORAL | Status: DC
  Administered 2021-06-26 – 2021-06-28 (×6): 200 mg via ORAL
  Filled 2021-06-26 (×2): qty 2
  Filled 2021-06-26: qty 8
  Filled 2021-06-26 (×5): qty 2

## 2021-06-26 MED ORDER — TRAMADOL HCL 50 MG PO TABS
50.0000 mg | ORAL_TABLET | Freq: Four times a day (QID) | ORAL | Status: DC | PRN
Start: 2021-06-26 — End: 2021-06-27
  Administered 2021-06-26 (×2): 50 mg via ORAL
  Filled 2021-06-26 (×2): qty 1

## 2021-06-26 MED ORDER — PRESERVISION AREDS 2 PO CAPS
1.0000 | ORAL_CAPSULE | Freq: Two times a day (BID) | ORAL | Status: DC
  Administered 2021-06-26 – 2021-06-28 (×5): 1 via ORAL
  Filled 2021-06-26 (×6): qty 1

## 2021-06-26 MED ORDER — ADULT MULTIVITAMIN W/MINERALS CH: 1.0000 | ORAL_TABLET | Freq: Every day | ORAL | Status: DC

## 2021-06-26 MED ORDER — ASPIRIN 81 MG PO CHEW
324.0000 mg | CHEWABLE_TABLET | ORAL | Status: AC
  Filled 2021-06-26: qty 4

## 2021-06-26 MED ORDER — NON FORMULARY: 1.0000 | Freq: Two times a day (BID) | Status: DC

## 2021-06-26 MED ORDER — METOPROLOL TARTRATE 25 MG PO TABS: 12.5000 mg | ORAL_TABLET | Freq: Two times a day (BID) | ORAL | Status: DC

## 2021-06-26 MED ORDER — ONDANSETRON HCL 4 MG/2ML IJ SOLN
4.0000 mg | Freq: Once | INTRAMUSCULAR | Status: AC
  Administered 2021-06-26: 4 mg via INTRAVENOUS
  Filled 2021-06-26: qty 2

## 2021-06-26 MED ORDER — LIDOCAINE 5 % EX PTCH
1.0000 | MEDICATED_PATCH | CUTANEOUS | Status: DC
  Administered 2021-06-26 – 2021-06-28 (×3): 1 via TRANSDERMAL
  Filled 2021-06-26 (×4): qty 1

## 2021-06-26 MED ORDER — SODIUM CHLORIDE 0.9 % IV SOLN: INTRAVENOUS | Status: DC

## 2021-06-26 MED ORDER — ATORVASTATIN CALCIUM 80 MG PO TABS
80.0000 mg | ORAL_TABLET | Freq: Every day | ORAL | Status: DC
  Administered 2021-06-26 – 2021-06-28 (×3): 80 mg via ORAL
  Filled 2021-06-26: qty 4
  Filled 2021-06-26 (×2): qty 1

## 2021-06-26 MED ORDER — LOSARTAN POTASSIUM 50 MG PO TABS
50.0000 mg | ORAL_TABLET | Freq: Every day | ORAL | Status: DC
  Administered 2021-06-26 – 2021-06-28 (×3): 50 mg via ORAL
  Filled 2021-06-26 (×3): qty 1

## 2021-06-26 MED ORDER — ASPIRIN EC 325 MG PO TBEC
325.0000 mg | DELAYED_RELEASE_TABLET | Freq: Every day | ORAL | Status: DC
  Administered 2021-06-27 – 2021-06-28 (×2): 325 mg via ORAL
  Filled 2021-06-26 (×2): qty 1

## 2021-06-26 MED ORDER — ADULT MULTIVITAMIN W/MINERALS CH
1.0000 | ORAL_TABLET | Freq: Every day | ORAL | Status: DC
  Administered 2021-06-26 – 2021-06-28 (×3): 1 via ORAL
  Filled 2021-06-26 (×4): qty 1

## 2021-06-26 MED ORDER — ONDANSETRON HCL 4 MG/2ML IJ SOLN: 4.0000 mg | Freq: Four times a day (QID) | INTRAMUSCULAR | Status: DC | PRN

## 2021-06-26 MED ORDER — HEPARIN (PORCINE) 25000 UT/250ML-% IV SOLN
900.0000 [IU]/h | INTRAVENOUS | Status: DC
  Administered 2021-06-26 – 2021-06-27 (×2): 900 [IU]/h via INTRAVENOUS
  Filled 2021-06-26: qty 250

## 2021-06-26 MED ORDER — ZOLPIDEM TARTRATE 5 MG PO TABS: 5.0000 mg | ORAL_TABLET | Freq: Every evening | ORAL | Status: DC | PRN
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
## 2021-06-26 NOTE — ED Notes (Signed)
Informed MD of pt's critical troponin
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
## 2021-06-27 ENCOUNTER — Inpatient Hospital Stay
Admit: 2021-06-27 | Discharge: 2021-06-27 | Disposition: A | Discharge status: Home / Self Care | Payer: PPO | Attending: Family Medicine | Admitting: Family Medicine

## 2021-06-27 DIAGNOSIS — I214 Non-ST elevation (NSTEMI) myocardial infarction: Secondary | ICD-10-CM | POA: Diagnosis not present

## 2021-06-27 LAB — ECHOCARDIOGRAM COMPLETE
AR max vel: 1.6 cm2
AV Peak grad: 12.8 mmHg
Ao pk vel: 1.79 m/s
Area-P 1/2: 2.79 cm2
Height: 65 in
S' Lateral: 3.16 cm
Weight: 3880.1 oz

## 2021-06-27 LAB — CBC
HCT: 33.3 % — ABNORMAL LOW (ref 36.0–46.0)
Hemoglobin: 10.8 g/dL — ABNORMAL LOW (ref 12.0–15.0)
MCH: 30.3 pg (ref 26.0–34.0)
MCHC: 32.4 g/dL (ref 30.0–36.0)
MCV: 93.3 fL (ref 80.0–100.0)
Platelets: 185 10*3/uL (ref 150–400)
RBC: 3.57 MIL/uL — ABNORMAL LOW (ref 3.87–5.11)
RDW: 14.8 % (ref 11.5–15.5)
WBC: 5.8 10*3/uL (ref 4.0–10.5)
nRBC: 0 % (ref 0.0–0.2)

## 2021-06-27 LAB — TROPONIN I (HIGH SENSITIVITY): Troponin I (High Sensitivity): 106 ng/L (ref <18)

## 2021-06-27 LAB — APTT: aPTT: 72 seconds — ABNORMAL HIGH (ref 24–36)

## 2021-06-27 LAB — HEPARIN LEVEL (UNFRACTIONATED): Heparin Unfractionated: 0.35 IU/mL (ref 0.30–0.70)

## 2021-06-27 MED ORDER — TRAMADOL HCL 50 MG PO TABS
50.0000 mg | ORAL_TABLET | Freq: Three times a day (TID) | ORAL | Status: DC
  Administered 2021-06-27 – 2021-06-29 (×5): 50 mg via ORAL
  Filled 2021-06-27 (×5): qty 1
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
## 2021-06-27 NOTE — TOC Initial Note (Signed)
Transition of Care [facility]) - Initial/Assessment Note    Patient Details  Name: [name] MRN: [id number] Date of Birth: Apr 03, 1941  Transition [facility]) CM/SW Contact:    Magnus Ivan, LCSW Phone Number: 06/27/2021, 9:45 AM  Clinical Narrative:                CSW spoke patient regarding discharge planning. Patient lives alone and at baseline drives herself to appointments. No DME, HH, or SNF history. PCP is [name] (NP). Pharmacy is [facility]. Patient has had 2 Moderna vaccines, has not had the COVID booster yet. Patient is agreeable to SNF, prefers somewhere in [place]. Would like TOC to review bed offers and Medicare.gov ratings with her when available. CSW started SNF workup.   Expected Discharge Plan: Skilled Nursing Facility Barriers to Discharge: Continued Medical Work up   Patient Goals and CMS Choice Patient states their goals for this hospitalization and ongoing recovery are:: SNF CMS Medicare.gov Compare Post Acute Care list provided to:: Patient Choice offered to / list presented to : Patient  Expected Discharge Plan and Services Expected Discharge Plan: [place]       Living arrangements for the past 2 months: Single Family Home                                      Prior Living Arrangements/Services Living arrangements for the past 2 months: Single Family Home Lives with:: Self Patient language and need for interpreter reviewed:: Yes Do you feel safe going back to the place where you live?: Yes      Need for Family Participation in Patient Care: Yes (Comment) Care giver support system in place?: Yes (comment)   Criminal Activity/Legal Involvement Pertinent to Current Situation/Hospitalization: No - Comment as needed  Activities of Daily Living Home Assistive Devices/Equipment: None ADL Screening (condition at time of admission) Patient's cognitive ability adequate to safely complete daily activities?:  Yes Is the patient deaf or have difficulty hearing?: No Does the patient have difficulty seeing, even when wearing glasses/contacts?: No Does the patient have difficulty concentrating, remembering, or making decisions?: No Patient able to express need for assistance with ADLs?: Yes Does the patient have difficulty dressing or bathing?: Yes Independently performs ADLs?: No Communication: Independent Dressing (OT): Needs assistance Is this a change from baseline?: Pre-admission baseline Grooming: Needs assistance Is this a change from baseline?: Pre-admission baseline Feeding: Needs assistance Is this a change from baseline?: Pre-admission baseline Bathing: Needs assistance Is this a change from baseline?: Pre-admission baseline Toileting: Needs assistance Is this a change from baseline?: Pre-admission baseline In/Out Bed: Needs assistance Is this a change from baseline?: Pre-admission baseline Walks in Home: Independent Does the patient have difficulty walking or climbing stairs?: No Weakness of Legs: None Weakness of Arms/Hands: Right  Permission Sought/Granted Permission sought to share information with : Chartered certified accountant granted to share information with : Yes, Verbal Permission Granted     Permission granted to share info w AGENCY: SNFs        Emotional Assessment       Orientation: : Oriented to Self, Oriented to Place, Oriented to  Time, Oriented to Situation Alcohol / Substance Use: Not Applicable Psych Involvement: No (comment)  Admission diagnosis:  NSTEMI (non-ST elevated myocardial infarction) ([place]) [I21.4] Generalized weakness [R53.1] Multiple falls [R29.6] Closed fracture of proximal end of right humerus with routine healing,  unspecified fracture morphology, subsequent encounter [S42.201D] Closed fracture of multiple ribs of right side with routine healing, subsequent encounter [S22.41XD] Patient Active Problem List   Diagnosis Date  Noted   NSTEMI (non-ST elevated myocardial infarction) ([place]) 06/26/2021   SOB (shortness of breath) on exertion 06/21/2021   Dysuria 02/16/2021   Obesity 02/16/2021   Atherosclerosis of aorta ([place]) 12/04/2020   History of deep vein thrombosis (DVT) of lower extremity 07/10/2020   S/P mastectomy, right 02/14/2020   Essential tremor 02/05/2020   Malignant neoplasm of right breast in female, estrogen receptor positive ([place])    History of thyroid nodule 08/12/2019   Right leg DVT ([place]) 05/24/2019   Acute deep vein thrombosis (DVT) of tibial vein of right lower extremity ([place]) 04/22/2019   Leg swelling 04/17/2019   History of colon cancer    Benign neoplasm of ascending colon    Genetic testing 09/26/2017   Family history of cancer    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   Neoplasm of digestive system    Bradycardia 02/14/2017   HLD (hyperlipidemia) 02/13/2017   Osteopenia 08/16/2016   Macular degeneration 08/12/2015   Routine general medical examination at a health care facility 09/26/2014   History of endometrial cancer 12/30/2013   Essential hypertension, benign 04/17/2013   Tremor 07/08/2011   PCP:  Burnard Hawthorne, FNP Pharmacy:   [facility] [address]), [place] [address] [facility]) [place] [address] Phone: [phone] Fax: [phone]     Social Determinants of Health (SDOH) Interventions    Readmission Risk Interventions Readmission Risk Prevention Plan 06/27/2021  Transportation Screening Complete  PCP or Specialist Appt within 5-7 Days Complete  Home Care Screening Complete  Medication Review (RN CM) Complete  Some recent data might be hidden

## 2021-06-27 NOTE — NC FL2 (Signed)
[place] LEVEL OF CARE SCREENING TOOL     IDENTIFICATION  Patient Name: [name] Birthdate: 01-03-1941 Sex: female Admission Date (Current Location): 06/18/2021  [facility] and [place] Number:  Engineering geologist and Address:  [facility], [address] [place], [place] [address]      Provider Number: [id number]  Attending Physician Name and Address:  Terrilee Croak, MD  Relative Name and Phone Number:  Daphene Calamity [phone]    Current Level of Care: Hospital Recommended Level of Care: [place] Prior Approval Number:    Date Approved/Denied:   PASRR Number: [id number]  Discharge Plan:      Current Diagnoses: Patient Active Problem List   Diagnosis Date Noted   NSTEMI (non-ST elevated myocardial infarction) ([place]) 06/26/2021   SOB (shortness of breath) on exertion 06/21/2021   Dysuria 02/16/2021   Obesity 02/16/2021   Atherosclerosis of aorta ([place]) 12/04/2020   History of deep vein thrombosis (DVT) of lower extremity 07/10/2020   S/P mastectomy, right 02/14/2020   Essential tremor 02/05/2020   Malignant neoplasm of right breast in female, estrogen receptor positive ([place])    History of thyroid nodule 08/12/2019   Right leg DVT ([place]) 05/24/2019   Acute deep vein thrombosis (DVT) of tibial vein of right lower extremity ([place]) 04/22/2019   Leg swelling 04/17/2019   History of colon cancer    Benign neoplasm of ascending colon    Genetic testing 09/26/2017   Family history of cancer    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/07/2017   Neoplasm of digestive system    Bradycardia 02/14/2017   HLD (hyperlipidemia) 02/13/2017   Osteopenia 08/16/2016   Macular degeneration 08/12/2015   Routine general medical examination at a health care facility 09/26/2014   History of endometrial cancer 12/30/2013   Essential hypertension, benign 04/17/2013   Tremor 07/08/2011    Orientation RESPIRATION  BLADDER Height & Weight     Self, Time, Situation, Place  Normal External catheter Weight: 242 lb 8.1 oz (110 kg) Height:  '5\' 5"'$  (165.1 cm)  BEHAVIORAL SYMPTOMS/MOOD NEUROLOGICAL BOWEL NUTRITION STATUS        Diet (heart, thin liquids)  AMBULATORY STATUS COMMUNICATION OF NEEDS Skin   Limited Assist Verbally Bruising                       Personal Care Assistance Level of Assistance  Bathing, Feeding, Dressing Bathing Assistance: Maximum assistance Feeding assistance: Limited assistance Dressing Assistance: Maximum assistance     Functional Limitations Info             SPECIAL CARE FACTORS FREQUENCY  OT (By licensed OT), PT (By licensed PT)     PT Frequency: 5 x/week OT Frequency: 5 x/week            Contractures      Additional Factors Info  Code Status, Allergies Code Status Info: DNR Allergies Info: oxycodone, codeine           Current Medications (06/27/2021):  This is the current hospital active medication list Current Facility-Administered Medications  Medication Dose Route Frequency Provider Last Rate Last Admin   0.9 %  sodium chloride infusion   Intravenous Continuous Mansy, Arvella Merles, MD 75 mL/hr at 06/27/21 0313 New Bag at 06/27/21 0313   acetaminophen (TYLENOL) tablet 650 mg  650 mg Oral Q4H PRN Mansy, Jan A, MD   650 mg at 06/26/21 1745   ALPRAZolam (XANAX) tablet 0.25 mg  0.25 mg Oral BID PRN Mansy, Jan A, MD       anastrozole (ARIMIDEX) tablet 1 mg  1 mg Oral Daily Mansy, Jan A, MD   1 mg at 06/27/21 0905   aspirin EC tablet 325 mg  325 mg Oral Daily Mansy, Jan A, MD   325 mg at 06/27/21 [id number]   atorvastatin (LIPITOR) tablet 80 mg  80 mg Oral Daily Mansy, Jan A, MD   80 mg at 06/27/21 [id number]   calcium-vitamin D (OSCAL WITH D) 500-200 MG-UNIT per tablet 2 tablet  2 tablet Oral Daily Mansy, Jan A, MD   2 tablet at 06/27/21 0906   heparin ADULT infusion 100 units/mL (25000 units/237m)  900 Units/hr Intravenous Continuous POswald Hillock RPH 9 mL/hr at  06/27/21 0352 900 Units/hr at 06/27/21 0352   lidocaine (LIDODERM) 5 % 1 patch  1 patch Transdermal Q24H DTerrilee Croak MD   1 patch at 06/26/21 1335   losartan (COZAAR) tablet 50 mg  50 mg Oral Daily Mansy, Jan A, MD   50 mg at 06/27/21 [id number]  multivitamin with minerals tablet 1 tablet  1 tablet Oral Daily Mansy, Jan A, MD   1 tablet at 06/27/21 0906   nitroGLYCERIN (NITROSTAT) SL tablet 0.4 mg  0.4 mg Sublingual Q5 Min x 3 PRN Mansy, Jan A, MD       ondansetron ([facility] injection 4 mg  4 mg Intravenous Q4H PRN Mansy, JArvella Merles MD       PreserVision AREDS 2 CAPS 1 capsule  1 capsule Oral BID BRenda Rolls RPH   1 capsule at 06/27/21 [id number]  primidone (MYSOLINE) tablet 50 mg  50 mg Oral BID Mansy, Jan A, MD   50 mg at 06/27/21 [id number]  propranolol ER (INDERAL LA) 24 hr capsule 160 mg  160 mg Oral Daily Mansy, Jan A, MD   160 mg at 06/27/21 0906   topiramate (TOPAMAX) tablet 200 mg  200 mg Oral BID Mansy, Jan A, MD   200 mg at 06/27/21 [id number]  traMADol (ULTRAM) tablet 50 mg  50 mg Oral Q6H PRN DTerrilee Croak MD   50 mg at 06/26/21 2113   zolpidem (AMBIEN) tablet 5 mg  5 mg Oral QHS PRN Mansy, JArvella Merles MD         Discharge Medications: Please see discharge summary for a list of discharge medications.  Relevant Imaging Results:  Relevant Lab Results:   Additional Information SS #: [id number] [place] LCSW

## 2021-06-28 DIAGNOSIS — I214 Non-ST elevation (NSTEMI) myocardial infarction: Secondary | ICD-10-CM | POA: Diagnosis not present

## 2021-06-28 LAB — CBC
HCT: 29.5 % — ABNORMAL LOW (ref 36.0–46.0)
Hemoglobin: 9.9 g/dL — ABNORMAL LOW (ref 12.0–15.0)
MCH: 30.9 pg (ref 26.0–34.0)
MCHC: 33.6 g/dL (ref 30.0–36.0)
MCV: 92.2 fL (ref 80.0–100.0)
Platelets: 174 10*3/uL (ref 150–400)
RBC: 3.2 MIL/uL — ABNORMAL LOW (ref 3.87–5.11)
RDW: 14.6 % (ref 11.5–15.5)
WBC: 7.9 10*3/uL (ref 4.0–10.5)
nRBC: 0.3 % — ABNORMAL HIGH (ref 0.0–0.2)

## 2021-06-28 MED ORDER — DOCUSATE SODIUM 100 MG PO CAPS
100.0000 mg | ORAL_CAPSULE | Freq: Two times a day (BID) | ORAL | Status: DC
  Administered 2021-06-28: 100 mg via ORAL
  Filled 2021-06-28: qty 1

## 2021-06-28 MED ORDER — FLEET ENEMA 7-19 GM/118ML RE ENEM: 1.0000 | ENEMA | Freq: Two times a day (BID) | RECTAL | Status: DC | PRN

## 2021-06-28 MED ORDER — APIXABAN 2.5 MG PO TABS
2.5000 mg | ORAL_TABLET | Freq: Two times a day (BID) | ORAL | Status: DC
  Administered 2021-06-28: 2.5 mg via ORAL
  Filled 2021-06-28 (×3): qty 1

## 2021-06-28 NOTE — TOC Progression Note (Signed)
Transition of Care [facility]) - Progression Note    Patient Details  Name: [name] MRN: [id number] Date of Birth: Sep 26, 1941  Transition of Care [facility]) CM/SW [place] Phone Number: 06/28/2021, 2:24 PM  Clinical Narrative:     CSW provided bed offers to patient ,patient gave phone to family in room. Family expressed wanting to be involved in decision making and wanting to speak with CSW, CSW informed them that initial SNF conversation was had by Meagan CSW and patient yesterday. CSW informed family that patient is fully oriented and able to make her own decisions.   Patient and family agree on [facility], insurance has been initiated with [employer].    Expected Discharge Plan: [place] Barriers to Discharge: Continued Medical Work up  Expected Discharge Plan and Services Expected Discharge Plan: [place] arrangements for the past 2 months: Single Family Home                                       Social Determinants of Health (SDOH) Interventions    Readmission Risk Interventions Readmission Risk Prevention Plan 06/27/2021  Transportation Screening Complete  PCP or Specialist Appt within 5-7 Days Complete  Home Care Screening Complete  Medication Review (RN CM) Complete  Some recent data might be hidden
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
## 2021-06-29 MED ORDER — SENNOSIDES-DOCUSATE SODIUM 8.6-50 MG PO TABS: 1.0000 | ORAL_TABLET | Freq: Every day | ORAL | Status: DC

## 2021-06-29 MED ORDER — PROPRANOLOL HCL ER 60 MG PO CP24
60.0000 mg | ORAL_CAPSULE | Freq: Every day | ORAL | Status: DC
  Filled 2021-06-29: qty 1

## 2021-06-29 MED ORDER — POLYETHYLENE GLYCOL 3350 17 G PO PACK
17.0000 g | PACK | Freq: Every day | ORAL | Status: DC | PRN
Start: 2021-06-29 — End: 2021-06-29

## 2021-07-01 ENCOUNTER — Telehealth: Payer: PPO

## 2021-07-15 NOTE — Progress Notes (Signed)
Patient alert and oriented x 4, denies pain with assess. Complains of no bowel movement >4 days, hospitalist notified and bowel regimen orders provided. Vitals remain stable, no adverse cardiac events during shift. Daughter present at bedside. Stable condition at end of shift, will continue to monitor.

## 2021-07-15 NOTE — Progress Notes (Signed)
Bedside shift report done at 0730 patient alert and oriented, talking, no complaints. Daughter at bedside. Went in at 0930 to give patient meds and PT in room sitting patient on the side of the bed. Patient became unresponsive just starring to one side would not respond. Immediately layed patient back down and sternal rubbed patient to try and get a response, patient still breathing and had a pulse but not responding. Rapid response called and attempted to get bp reading while trying to get response from patient. BP 57/42. Patient placed in trendelenberg and placed on NRB mask. She started having agonal respirations and still not responding. Verified with daughter that DNR is still what was wanted for patient and she agreed that she is DNR. Dr Pietro Cassis in to assess patient. She became asystole on monitor and lost her pulse. Time of death called at [phone]. Daughter at bedside holding her hand. Dr Pietro Cassis and rapid response team at bedside also.
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
## 2021-07-15 NOTE — Significant Event (Signed)
Rapid Response Event Note   Reason for Call :  Respiratory distress  Initial Focused Assessment:   Per bedside RN- patient had worked with PT and patient had difficulty breathing - when I arrived patient was blue in color and agonal breathing.  Bedside RN stated patient is a DNR.    Interventions:  Patient was placed on non-rebreather.  Daughter at bedside to comfort patient.   Patient then passed with MD, RN and daughter at bedside. Plan of Care:     Event Summary:   MD Notified: Dr. Pietro Cassis Call Time:09;30 Arrival Time:09:31 End Time:09:33  Penne Lash, RN

## 2021-07-15 NOTE — Progress Notes (Signed)
Prayer emotional support patient deceased

## 2021-07-15 NOTE — Care Management Important Message (Signed)
Important Message  Patient Details  Name: SELAM ATEHORTUA MRN: [id number] Date of Birth: [birth year]   Medicare Important Message Given:  Other (see comment)  Patient expired.  Medicare IM withheld.    Dannette Barbara 07-29-2021, 11:18 AM

## 2021-07-15 DEATH — deceased

## 2021-07-28 ENCOUNTER — Ambulatory Visit: Payer: PPO

## 2021-10-13 ENCOUNTER — Ambulatory Visit: Payer: PPO | Admitting: Oncology

## 2021-10-13 ENCOUNTER — Other Ambulatory Visit: Payer: PPO

## 2021-11-23 ENCOUNTER — Ambulatory Visit: Payer: PPO

## 2021-12-21 ENCOUNTER — Ambulatory Visit: Payer: PPO | Admitting: Family

## 2024-10-01 NOTE — Telephone Encounter (Signed)
 open in error
